# Patient Record
Sex: Male | Born: 1945 | Race: White | Hispanic: No | Smoking: Never smoker
Health system: Southern US, Community
[De-identification: ages and names within clinical notes are randomized; demographics above are authoritative.]

## PROBLEM LIST (undated history)

## (undated) DIAGNOSIS — E559 Vitamin D deficiency, unspecified: Secondary | ICD-10-CM

## (undated) DIAGNOSIS — M5020 Other cervical disc displacement, unspecified cervical region: Secondary | ICD-10-CM

## (undated) DIAGNOSIS — C9002 Multiple myeloma in relapse: Secondary | ICD-10-CM

## (undated) DIAGNOSIS — Z992 Dependence on renal dialysis: Secondary | ICD-10-CM

## (undated) DIAGNOSIS — C9000 Multiple myeloma not having achieved remission: Principal | ICD-10-CM

## (undated) DIAGNOSIS — D638 Anemia in other chronic diseases classified elsewhere: Secondary | ICD-10-CM

## (undated) DIAGNOSIS — W1809XA Striking against other object with subsequent fall, initial encounter: Secondary | ICD-10-CM

## (undated) DIAGNOSIS — N185 Chronic kidney disease, stage 5: Secondary | ICD-10-CM

## (undated) DIAGNOSIS — K5900 Constipation, unspecified: Secondary | ICD-10-CM

## (undated) DIAGNOSIS — I10 Essential (primary) hypertension: Secondary | ICD-10-CM

## (undated) DIAGNOSIS — D696 Thrombocytopenia, unspecified: Principal | ICD-10-CM

## (undated) DIAGNOSIS — R0902 Hypoxemia: Secondary | ICD-10-CM

## (undated) HISTORY — DX: Chronic kidney disease, stage 5: N18.5

## (undated) HISTORY — DX: Anemia in other chronic diseases classified elsewhere: D63.8

## (undated) HISTORY — PX: BONE MARROW ASPIRATE AND BIOPSY WIITH LUMBAR PUNCTURE: SHX1250

## (undated) HISTORY — PX: INGUINAL HERNIA REPAIR: SUR1180

## (undated) HISTORY — DX: Thrombocytopenia, unspecified: D69.6

## (undated) HISTORY — DX: Other cervical disc displacement, unspecified cervical region: M50.20

## (undated) HISTORY — DX: Vitamin D deficiency, unspecified: E55.9

## (undated) HISTORY — DX: Dependence on renal dialysis: Z99.2

## (undated) HISTORY — PX: CERVICAL SPINE SURGERY: SHX589

## (undated) HISTORY — DX: Multiple myeloma in relapse: C90.02

## (undated) HISTORY — DX: Multiple myeloma not having achieved remission: C90.00

## (undated) HISTORY — DX: Striking against other object with subsequent fall, initial encounter: W18.09XA

## (undated) HISTORY — PX: MELANOMA EXCISION: SHX5266

## (undated) HISTORY — PX: ANTERIOR CERVICAL DECOMP/DISCECTOMY FUSION: SHX1161

---

## 2005-01-06 ENCOUNTER — Emergency Department (HOSPITAL_COMMUNITY): Admission: EM | Admit: 2005-01-06 | Discharge: 2005-01-07 | Payer: Self-pay | Admitting: Emergency Medicine

## 2005-01-08 ENCOUNTER — Encounter (HOSPITAL_COMMUNITY): Admission: RE | Admit: 2005-01-08 | Discharge: 2005-02-07 | Payer: Self-pay | Admitting: Oncology

## 2005-01-08 ENCOUNTER — Encounter: Admission: RE | Admit: 2005-01-08 | Discharge: 2005-01-08 | Payer: Self-pay | Admitting: Oncology

## 2005-01-08 ENCOUNTER — Ambulatory Visit (HOSPITAL_COMMUNITY): Payer: Self-pay | Admitting: Oncology

## 2005-01-08 ENCOUNTER — Inpatient Hospital Stay (HOSPITAL_COMMUNITY): Admission: AD | Admit: 2005-01-08 | Discharge: 2005-01-13 | Payer: Self-pay | Admitting: Family Medicine

## 2005-01-13 ENCOUNTER — Ambulatory Visit: Payer: Self-pay | Admitting: Oncology

## 2005-01-13 ENCOUNTER — Encounter (HOSPITAL_COMMUNITY): Payer: Self-pay | Admitting: Oncology

## 2005-04-27 ENCOUNTER — Encounter: Admission: RE | Admit: 2005-04-27 | Discharge: 2005-04-27 | Payer: Self-pay | Admitting: Oncology

## 2005-04-27 ENCOUNTER — Ambulatory Visit (HOSPITAL_COMMUNITY): Payer: Self-pay | Admitting: Oncology

## 2005-05-03 ENCOUNTER — Observation Stay (HOSPITAL_COMMUNITY): Admission: EM | Admit: 2005-05-03 | Discharge: 2005-05-05 | Payer: Self-pay | Admitting: Emergency Medicine

## 2005-05-07 ENCOUNTER — Ambulatory Visit: Payer: Self-pay | Admitting: Cardiology

## 2005-05-19 ENCOUNTER — Encounter: Admission: RE | Admit: 2005-05-19 | Discharge: 2005-05-19 | Payer: Self-pay | Admitting: Oncology

## 2005-05-19 ENCOUNTER — Encounter (HOSPITAL_COMMUNITY): Admission: RE | Admit: 2005-05-19 | Discharge: 2005-06-18 | Payer: Self-pay | Admitting: Oncology

## 2005-06-13 ENCOUNTER — Emergency Department (HOSPITAL_COMMUNITY): Admission: EM | Admit: 2005-06-13 | Discharge: 2005-06-13 | Payer: Self-pay | Admitting: Emergency Medicine

## 2005-06-26 ENCOUNTER — Encounter (HOSPITAL_COMMUNITY): Admission: RE | Admit: 2005-06-26 | Discharge: 2005-07-26 | Payer: Self-pay | Admitting: Oncology

## 2005-06-26 ENCOUNTER — Encounter: Admission: RE | Admit: 2005-06-26 | Discharge: 2005-06-26 | Payer: Self-pay | Admitting: Oncology

## 2005-07-06 ENCOUNTER — Ambulatory Visit (HOSPITAL_COMMUNITY): Payer: Self-pay | Admitting: Oncology

## 2005-07-19 ENCOUNTER — Inpatient Hospital Stay (HOSPITAL_COMMUNITY): Admission: EM | Admit: 2005-07-19 | Discharge: 2005-07-21 | Payer: Self-pay | Admitting: Emergency Medicine

## 2005-08-03 ENCOUNTER — Encounter: Admission: RE | Admit: 2005-08-03 | Discharge: 2005-08-03 | Payer: Self-pay | Admitting: Oncology

## 2005-08-03 ENCOUNTER — Encounter (HOSPITAL_COMMUNITY): Admission: RE | Admit: 2005-08-03 | Discharge: 2005-09-02 | Payer: Self-pay | Admitting: Oncology

## 2005-08-15 ENCOUNTER — Emergency Department (HOSPITAL_COMMUNITY): Admission: EM | Admit: 2005-08-15 | Discharge: 2005-08-15 | Payer: Self-pay | Admitting: Emergency Medicine

## 2005-09-07 ENCOUNTER — Ambulatory Visit (HOSPITAL_COMMUNITY): Payer: Self-pay | Admitting: Oncology

## 2005-09-14 ENCOUNTER — Encounter: Admission: RE | Admit: 2005-09-14 | Discharge: 2005-09-14 | Payer: Self-pay | Admitting: Oncology

## 2005-09-14 ENCOUNTER — Encounter (HOSPITAL_COMMUNITY): Admission: RE | Admit: 2005-09-14 | Discharge: 2005-10-14 | Payer: Self-pay | Admitting: Oncology

## 2005-09-22 ENCOUNTER — Emergency Department (HOSPITAL_COMMUNITY): Admission: EM | Admit: 2005-09-22 | Discharge: 2005-09-22 | Payer: Self-pay | Admitting: Emergency Medicine

## 2005-11-03 ENCOUNTER — Ambulatory Visit (HOSPITAL_COMMUNITY): Payer: Self-pay | Admitting: Oncology

## 2005-11-03 ENCOUNTER — Encounter: Admission: RE | Admit: 2005-11-03 | Discharge: 2005-11-03 | Payer: Self-pay | Admitting: Oncology

## 2005-11-03 ENCOUNTER — Encounter (HOSPITAL_COMMUNITY): Admission: RE | Admit: 2005-11-03 | Discharge: 2005-12-03 | Payer: Self-pay | Admitting: Oncology

## 2005-11-10 ENCOUNTER — Ambulatory Visit (HOSPITAL_COMMUNITY): Payer: Self-pay | Admitting: Oncology

## 2005-12-11 ENCOUNTER — Encounter (HOSPITAL_COMMUNITY): Admission: RE | Admit: 2005-12-11 | Discharge: 2006-01-10 | Payer: Self-pay | Admitting: Oncology

## 2005-12-11 ENCOUNTER — Encounter: Admission: RE | Admit: 2005-12-11 | Discharge: 2005-12-11 | Payer: Self-pay | Admitting: Oncology

## 2006-01-01 ENCOUNTER — Ambulatory Visit (HOSPITAL_COMMUNITY): Payer: Self-pay | Admitting: Oncology

## 2006-01-11 ENCOUNTER — Encounter: Admission: RE | Admit: 2006-01-11 | Discharge: 2006-01-11 | Payer: Self-pay | Admitting: Oncology

## 2006-01-11 ENCOUNTER — Encounter (HOSPITAL_COMMUNITY): Admission: RE | Admit: 2006-01-11 | Discharge: 2006-02-10 | Payer: Self-pay | Admitting: Oncology

## 2006-01-12 ENCOUNTER — Emergency Department (HOSPITAL_COMMUNITY): Admission: EM | Admit: 2006-01-12 | Discharge: 2006-01-12 | Payer: Self-pay | Admitting: Emergency Medicine

## 2006-01-15 ENCOUNTER — Ambulatory Visit (HOSPITAL_COMMUNITY): Admission: RE | Admit: 2006-01-15 | Discharge: 2006-01-15 | Payer: Self-pay | Admitting: Oncology

## 2006-02-11 ENCOUNTER — Encounter (HOSPITAL_COMMUNITY): Admission: RE | Admit: 2006-02-11 | Discharge: 2006-02-13 | Payer: Self-pay | Admitting: Oncology

## 2006-02-15 ENCOUNTER — Encounter (HOSPITAL_COMMUNITY): Admission: RE | Admit: 2006-02-15 | Discharge: 2006-03-17 | Payer: Self-pay | Admitting: Oncology

## 2006-03-05 ENCOUNTER — Ambulatory Visit (HOSPITAL_COMMUNITY): Payer: Self-pay | Admitting: Oncology

## 2006-03-18 ENCOUNTER — Encounter (HOSPITAL_COMMUNITY): Admission: RE | Admit: 2006-03-18 | Discharge: 2006-04-17 | Payer: Self-pay | Admitting: Oncology

## 2006-04-28 ENCOUNTER — Encounter (HOSPITAL_COMMUNITY): Admission: RE | Admit: 2006-04-28 | Discharge: 2006-05-17 | Payer: Self-pay | Admitting: Oncology

## 2006-04-28 ENCOUNTER — Ambulatory Visit (HOSPITAL_COMMUNITY): Payer: Self-pay | Admitting: Oncology

## 2006-05-19 ENCOUNTER — Encounter (HOSPITAL_COMMUNITY): Admission: RE | Admit: 2006-05-19 | Discharge: 2006-06-18 | Payer: Self-pay | Admitting: Oncology

## 2006-06-30 ENCOUNTER — Ambulatory Visit (HOSPITAL_COMMUNITY): Payer: Self-pay | Admitting: Oncology

## 2006-07-05 ENCOUNTER — Ambulatory Visit (HOSPITAL_COMMUNITY): Admission: RE | Admit: 2006-07-05 | Discharge: 2006-07-05 | Payer: Self-pay | Admitting: General Surgery

## 2006-07-21 ENCOUNTER — Encounter (HOSPITAL_COMMUNITY): Admission: RE | Admit: 2006-07-21 | Discharge: 2006-08-20 | Payer: Self-pay | Admitting: Oncology

## 2006-08-16 ENCOUNTER — Ambulatory Visit (HOSPITAL_COMMUNITY): Payer: Self-pay | Admitting: Oncology

## 2006-09-02 ENCOUNTER — Encounter (HOSPITAL_COMMUNITY): Admission: RE | Admit: 2006-09-02 | Discharge: 2006-10-02 | Payer: Self-pay | Admitting: Oncology

## 2006-10-12 ENCOUNTER — Ambulatory Visit (HOSPITAL_COMMUNITY): Payer: Self-pay | Admitting: Oncology

## 2006-10-12 ENCOUNTER — Encounter (HOSPITAL_COMMUNITY): Admission: RE | Admit: 2006-10-12 | Discharge: 2006-11-11 | Payer: Self-pay | Admitting: Oncology

## 2006-11-25 ENCOUNTER — Encounter (HOSPITAL_COMMUNITY): Admission: RE | Admit: 2006-11-25 | Discharge: 2006-12-25 | Payer: Self-pay | Admitting: Oncology

## 2007-01-20 ENCOUNTER — Ambulatory Visit (HOSPITAL_COMMUNITY): Payer: Self-pay | Admitting: Oncology

## 2007-01-20 ENCOUNTER — Encounter (HOSPITAL_COMMUNITY): Admission: RE | Admit: 2007-01-20 | Discharge: 2007-02-15 | Payer: Self-pay | Admitting: Oncology

## 2007-02-17 ENCOUNTER — Encounter (HOSPITAL_COMMUNITY): Admission: RE | Admit: 2007-02-17 | Discharge: 2007-03-19 | Payer: Self-pay | Admitting: Oncology

## 2007-03-24 ENCOUNTER — Ambulatory Visit (HOSPITAL_COMMUNITY): Payer: Self-pay | Admitting: Oncology

## 2007-03-24 ENCOUNTER — Encounter (HOSPITAL_COMMUNITY): Admission: RE | Admit: 2007-03-24 | Discharge: 2007-04-23 | Payer: Self-pay | Admitting: Oncology

## 2007-05-25 ENCOUNTER — Encounter (HOSPITAL_COMMUNITY): Admission: RE | Admit: 2007-05-25 | Discharge: 2007-06-24 | Payer: Self-pay | Admitting: Oncology

## 2007-05-25 ENCOUNTER — Ambulatory Visit (HOSPITAL_COMMUNITY): Payer: Self-pay | Admitting: Oncology

## 2007-07-13 ENCOUNTER — Ambulatory Visit (HOSPITAL_COMMUNITY): Payer: Self-pay | Admitting: Oncology

## 2007-07-13 ENCOUNTER — Encounter (HOSPITAL_COMMUNITY): Admission: RE | Admit: 2007-07-13 | Discharge: 2007-08-12 | Payer: Self-pay | Admitting: Oncology

## 2007-08-17 ENCOUNTER — Encounter (HOSPITAL_COMMUNITY): Admission: RE | Admit: 2007-08-17 | Discharge: 2007-09-16 | Payer: Self-pay | Admitting: Oncology

## 2007-09-07 ENCOUNTER — Ambulatory Visit (HOSPITAL_COMMUNITY): Payer: Self-pay | Admitting: Oncology

## 2007-10-05 ENCOUNTER — Encounter (HOSPITAL_COMMUNITY): Admission: RE | Admit: 2007-10-05 | Discharge: 2007-11-04 | Payer: Self-pay | Admitting: Oncology

## 2007-10-11 ENCOUNTER — Inpatient Hospital Stay (HOSPITAL_COMMUNITY): Admission: EM | Admit: 2007-10-11 | Discharge: 2007-10-13 | Payer: Self-pay | Admitting: Emergency Medicine

## 2007-10-12 ENCOUNTER — Ambulatory Visit: Payer: Self-pay | Admitting: Cardiovascular Disease

## 2007-10-12 ENCOUNTER — Encounter (INDEPENDENT_AMBULATORY_CARE_PROVIDER_SITE_OTHER): Payer: Self-pay | Admitting: Internal Medicine

## 2007-11-08 ENCOUNTER — Encounter (HOSPITAL_COMMUNITY): Admission: RE | Admit: 2007-11-08 | Discharge: 2007-12-08 | Payer: Self-pay | Admitting: Oncology

## 2007-11-08 ENCOUNTER — Ambulatory Visit (HOSPITAL_COMMUNITY): Payer: Self-pay | Admitting: Oncology

## 2008-01-12 ENCOUNTER — Ambulatory Visit (HOSPITAL_COMMUNITY): Payer: Self-pay | Admitting: Oncology

## 2008-01-12 ENCOUNTER — Encounter (HOSPITAL_COMMUNITY): Admission: RE | Admit: 2008-01-12 | Discharge: 2008-02-11 | Payer: Self-pay | Admitting: Oncology

## 2008-02-15 ENCOUNTER — Encounter (HOSPITAL_COMMUNITY): Admission: RE | Admit: 2008-02-15 | Discharge: 2008-03-16 | Payer: Self-pay | Admitting: Oncology

## 2008-03-15 ENCOUNTER — Ambulatory Visit (HOSPITAL_COMMUNITY): Payer: Self-pay | Admitting: Oncology

## 2008-04-10 ENCOUNTER — Encounter (HOSPITAL_COMMUNITY): Admission: RE | Admit: 2008-04-10 | Discharge: 2008-05-10 | Payer: Self-pay | Admitting: Oncology

## 2008-05-17 ENCOUNTER — Encounter (HOSPITAL_COMMUNITY): Admission: RE | Admit: 2008-05-17 | Discharge: 2008-06-16 | Payer: Self-pay | Admitting: Oncology

## 2008-05-17 ENCOUNTER — Ambulatory Visit (HOSPITAL_COMMUNITY): Payer: Self-pay | Admitting: Oncology

## 2008-07-12 ENCOUNTER — Ambulatory Visit (HOSPITAL_COMMUNITY): Payer: Self-pay | Admitting: Oncology

## 2008-07-12 ENCOUNTER — Encounter (HOSPITAL_COMMUNITY): Admission: RE | Admit: 2008-07-12 | Discharge: 2008-08-11 | Payer: Self-pay | Admitting: Oncology

## 2008-09-03 ENCOUNTER — Ambulatory Visit (HOSPITAL_COMMUNITY): Payer: Self-pay | Admitting: Oncology

## 2008-09-03 ENCOUNTER — Encounter (HOSPITAL_COMMUNITY): Admission: RE | Admit: 2008-09-03 | Discharge: 2008-10-03 | Payer: Self-pay | Admitting: Oncology

## 2008-10-19 ENCOUNTER — Ambulatory Visit (HOSPITAL_COMMUNITY): Payer: Self-pay | Admitting: Oncology

## 2008-10-19 ENCOUNTER — Encounter (HOSPITAL_COMMUNITY): Admission: RE | Admit: 2008-10-19 | Discharge: 2008-11-18 | Payer: Self-pay | Admitting: Oncology

## 2009-01-15 ENCOUNTER — Ambulatory Visit (HOSPITAL_COMMUNITY): Payer: Self-pay | Admitting: Oncology

## 2009-01-15 ENCOUNTER — Encounter (HOSPITAL_COMMUNITY): Admission: RE | Admit: 2009-01-15 | Discharge: 2009-02-13 | Payer: Self-pay | Admitting: Oncology

## 2009-02-22 ENCOUNTER — Encounter (HOSPITAL_COMMUNITY): Admission: RE | Admit: 2009-02-22 | Discharge: 2009-03-24 | Payer: Self-pay | Admitting: Oncology

## 2009-04-05 ENCOUNTER — Ambulatory Visit (HOSPITAL_COMMUNITY): Payer: Self-pay | Admitting: Oncology

## 2009-04-05 ENCOUNTER — Encounter (HOSPITAL_COMMUNITY): Admission: RE | Admit: 2009-04-05 | Discharge: 2009-05-05 | Payer: Self-pay | Admitting: Oncology

## 2009-04-20 ENCOUNTER — Emergency Department (HOSPITAL_COMMUNITY): Admission: EM | Admit: 2009-04-20 | Discharge: 2009-04-20 | Payer: Self-pay | Admitting: Emergency Medicine

## 2009-05-16 ENCOUNTER — Encounter (HOSPITAL_COMMUNITY): Admission: RE | Admit: 2009-05-16 | Discharge: 2009-06-15 | Payer: Self-pay | Admitting: Oncology

## 2009-06-27 ENCOUNTER — Ambulatory Visit (HOSPITAL_COMMUNITY): Payer: Self-pay | Admitting: Oncology

## 2009-06-27 ENCOUNTER — Encounter (HOSPITAL_COMMUNITY): Admission: RE | Admit: 2009-06-27 | Discharge: 2009-07-27 | Payer: Self-pay | Admitting: Oncology

## 2009-09-19 ENCOUNTER — Encounter (HOSPITAL_COMMUNITY): Admission: RE | Admit: 2009-09-19 | Discharge: 2009-10-19 | Payer: Self-pay | Admitting: Oncology

## 2009-09-19 ENCOUNTER — Ambulatory Visit (HOSPITAL_COMMUNITY): Payer: Self-pay | Admitting: Oncology

## 2009-12-19 ENCOUNTER — Encounter (HOSPITAL_COMMUNITY): Admission: RE | Admit: 2009-12-19 | Discharge: 2010-01-18 | Payer: Self-pay | Admitting: Oncology

## 2010-02-21 ENCOUNTER — Ambulatory Visit (HOSPITAL_COMMUNITY): Payer: Self-pay | Admitting: Oncology

## 2010-02-21 ENCOUNTER — Encounter (HOSPITAL_COMMUNITY)
Admission: RE | Admit: 2010-02-21 | Discharge: 2010-03-23 | Payer: Self-pay | Source: Home / Self Care | Admitting: Oncology

## 2010-03-05 ENCOUNTER — Ambulatory Visit: Payer: Self-pay | Admitting: Cardiology

## 2010-03-05 DIAGNOSIS — I498 Other specified cardiac arrhythmias: Secondary | ICD-10-CM

## 2010-03-05 DIAGNOSIS — I10 Essential (primary) hypertension: Secondary | ICD-10-CM | POA: Insufficient documentation

## 2010-03-11 ENCOUNTER — Ambulatory Visit: Payer: Self-pay | Admitting: Cardiology

## 2010-03-12 ENCOUNTER — Telehealth (INDEPENDENT_AMBULATORY_CARE_PROVIDER_SITE_OTHER): Payer: Self-pay

## 2010-03-21 ENCOUNTER — Telehealth (INDEPENDENT_AMBULATORY_CARE_PROVIDER_SITE_OTHER): Payer: Self-pay

## 2010-03-25 ENCOUNTER — Encounter (HOSPITAL_COMMUNITY)
Admission: RE | Admit: 2010-03-25 | Discharge: 2010-04-24 | Payer: Self-pay | Source: Home / Self Care | Attending: Oncology | Admitting: Oncology

## 2010-04-04 ENCOUNTER — Ambulatory Visit (HOSPITAL_COMMUNITY): Admission: RE | Admit: 2010-04-04 | Discharge: 2010-04-04 | Payer: Self-pay | Admitting: Oncology

## 2010-04-06 ENCOUNTER — Emergency Department (HOSPITAL_COMMUNITY): Admission: EM | Admit: 2010-04-06 | Discharge: 2010-04-06 | Payer: Self-pay | Admitting: Emergency Medicine

## 2010-04-14 ENCOUNTER — Ambulatory Visit: Payer: Self-pay | Admitting: Cardiology

## 2010-04-21 ENCOUNTER — Telehealth (INDEPENDENT_AMBULATORY_CARE_PROVIDER_SITE_OTHER): Payer: Self-pay

## 2010-04-28 ENCOUNTER — Telehealth (INDEPENDENT_AMBULATORY_CARE_PROVIDER_SITE_OTHER): Payer: Self-pay | Admitting: *Deleted

## 2010-04-29 ENCOUNTER — Encounter (HOSPITAL_COMMUNITY)
Admission: RE | Admit: 2010-04-29 | Discharge: 2010-05-29 | Payer: Self-pay | Source: Home / Self Care | Attending: Oncology | Admitting: Oncology

## 2010-04-29 ENCOUNTER — Ambulatory Visit (HOSPITAL_COMMUNITY): Payer: Self-pay | Admitting: Oncology

## 2010-05-01 ENCOUNTER — Encounter: Payer: Self-pay | Admitting: Cardiology

## 2010-05-06 ENCOUNTER — Ambulatory Visit: Payer: Self-pay | Admitting: Cardiology

## 2010-05-20 ENCOUNTER — Ambulatory Visit (HOSPITAL_COMMUNITY)
Admission: RE | Admit: 2010-05-20 | Discharge: 2010-05-20 | Payer: Self-pay | Source: Home / Self Care | Attending: Family Medicine | Admitting: Family Medicine

## 2010-06-07 ENCOUNTER — Encounter: Payer: Self-pay | Admitting: Family Medicine

## 2010-06-08 ENCOUNTER — Encounter: Payer: Self-pay | Admitting: Family Medicine

## 2010-06-08 ENCOUNTER — Encounter (HOSPITAL_COMMUNITY): Payer: Self-pay | Admitting: Oncology

## 2010-06-17 NOTE — Assessment & Plan Note (Signed)
Summary: ***np6 uncontrolled htn   Visit Type:  Initial Consult Referring Provider:  Dr. [name] Primary Provider:  Dr. Karleen Hampshire   History of Present Illness: 65 year old male referred for cardiology consultation. He is referred for assistance with management of high blood pressure. He has been on verapamil for some time now with steadily escalating doses. Diastolic was at a few weeks ago. Blood pressure is better controlled, however the patient is significantly bradycardic, and describes weakness. He reports having a relatively low heart rate at baseline anyway.  No problems with chest pain or unusual shortness of breath. He has a history of multiple myeloma as noted below.  Recent lab work from 11 October showed potassium 4.3, BUN 35, creatinine 2.4, hemoglobin 14.2, platelets 75, CBC 5.9. Also evidence of advancing proteinuria.  Current Medications (verified): 1)  Verapamil Hcl Cr 360 Mg Xr24h-Cap (Verapamil Hcl) .... Take 1 Cap Daily 2)  Bystolic 5 Mg Tabs (Nebivolol Hcl) .... Take 1 Tab Daily 3)  Fish Oil 1000 Mg Caps (Omega-3 Fatty Acids) .... Take 2 Caps Daily  Allergies (verified): No Known Drug Allergies  Past History:  Family History: Last updated: 03/05/2010 Family History of Diabetes  Social History: Last updated: 03/05/2010 Single  Tobacco Use - No Alcohol Use - no Regular Exercise - yes Drug Use - no  Past Medical History: Chronic renal insufficency Pancytopenia Hypogonadism Multiple myeloma - Dr. Mariel Sleet Hypertension  Past Surgical History: Left inguinal herniorrhaphy 2008  Family History: Family History of Diabetes  Social History: Single  Tobacco Use - No Alcohol Use - no Regular Exercise - yes Drug Use - no  Review of Systems  The patient denies anorexia, fever, weight loss, chest pain, syncope, dyspnea on exertion, peripheral edema, melena, and hematochezia.         Otherwise reviewed and negative.  Vital  Signs:  Patient profile:   64 year old male Height:      72 inches Weight:      209 pounds BMI:     28.45 Pulse rate:   41 / minute BP sitting:   132 / 74  (right arm)  Vitals Entered By: Dreama Saa, CNA (March 05, 2010 1:43 PM)  Physical Exam  Additional Exam:  Overweight male in no acute distress. HEENT: Conjunctiva and lids normal, oropharynx clear. Neck: Supple, no elevated JVP or bruits. Cardiac: Regular rate and rhythm, no S3 or significant systolic murmur. Lungs: Clear to auscultation, nonlabored. Abdomen: Soft, nontender, bowel sounds present. Skin: Warm and dry. Extremities: No pitting edema, distal pulses full. Musculoskeletal: No kyphosis. Neuropsychiatric: Alert and oriented x3, affect grossly appropriate.    Echocardiogram  Procedure date:  10/12/2007  Findings:       SUMMARY   -  Overall left ventricular systolic function was normal. Left         ventricular ejection fraction was estimated to be 60 %. Left         ventricular wall thickness was mildly increased.   -  Calcification of non-coronary cu  Carotid Doppler  Procedure date:  10/12/2007  Findings:      IMPRESSION:    1.  Early bilateral carotid bifurcation plaque without significant   stenosis.  The exam does not exclude plaque ulceration or   embolization.  Continued surveillance recommended.   EKG  Procedure date:  03/05/2010  Findings:      Significant sinus bradycardia at 39 beats per minute with left atrial enlargement and left ventricular hypertrophy, QRS 118 ms.  Impression & Recommendations:  Problem # 1:  ESSENTIAL HYPERTENSION, BENIGN (ICD-401.1)  Verapamil is not an unreasonable medicine in this case, particularly with LVH and proteinuria, however escalating doses over time have not obtained blood pressure control. He has had better response in blood pressure with the recent addition of Bystolic, however these medicines together are leading to significant bradycardia.  We discussed this today, and our plan will be to wean him off verapamil over 2 days with initiation of Norvasc 5 mg daily and continuation of Bystolic. Will then have him come in to the office for a nurse blood pressure check next week, anticipating further medication adjustments. I will then see him back in one month.  The following medications were removed from the medication list:    Lisinopril 10 Mg Tabs (Lisinopril) .Marland Kitchen... Take 1 tab daily    Aspir-low 81 Mg Tbec (Aspirin) .Marland Kitchen... Take 1 tab daily    Verapamil Hcl Cr 240 Mg Cr-tabs (Verapamil hcl) .Marland Kitchen... Take 1 tab daily His updated medication list for this problem includes:    Verapamil Hcl Cr 360 Mg Xr24h-cap (Verapamil hcl) .Marland Kitchen... Take 1 cap daily    Bystolic 5 Mg Tabs (Nebivolol hcl) .Marland Kitchen... Take 1 tab daily  Problem # 2:  BRADYCARDIA (ICD-427.89)  Patient reports weakness, some fatigue, likely related in some way to slow heart rates. As noted above, medication adjustments will be made. Norvasc is unlikely to significantly affect heart rate.  The following medications were removed from the medication list:    Lisinopril 10 Mg Tabs (Lisinopril) .Marland Kitchen... Take 1 tab daily    Aspir-low 81 Mg Tbec (Aspirin) .Marland Kitchen... Take 1 tab daily    Verapamil Hcl Cr 240 Mg Cr-tabs (Verapamil hcl) .Marland Kitchen... Take 1 tab daily    Verapamil Hcl Cr 360 Mg Xr24h-cap (Verapamil hcl) .Marland Kitchen... Take 1 cap daily    Norvasc 5 Mg Tabs (Amlodipine besylate) .Marland Kitchen... Take 1 tablet po once daily His updated medication list for this problem includes:    Bystolic 5 Mg Tabs (Nebivolol hcl) .Marland Kitchen... Take 1 tab daily    Norvasc 5 Mg Tabs (Amlodipine besylate) .Marland Kitchen... Take 1 tablet by mouth once daily  Patient Instructions: 1)  Your physician recommends that you schedule a follow-up appointment in: 1 week for nurse visit and 1 month with Dr. Diona Browner 2)  Your physician has recommended you make the following change in your medication: Start taking Norvasc 5 mg by mouth once daily. Once you start this  medication, decrease Verapamil to 180mg   by mouth for 2 days then stop taking.  (continue to take Norvasc but stop the Verapamil) Prescriptions: NORVASC 5 MG TABS (AMLODIPINE BESYLATE) take 1 tablet po once daily  #30 x 3   Entered by:   Larita Fife Via LPN   Authorized by:   Loreli Slot, MD, [facility]   Signed by:   Larita Fife Via LPN on 08/65/7846   Method used:   Electronically to        [facility] (retail)       [address] [place], [place]  [address]       Ph: [phone]       Fax: [phone]   RxID:   [id number]

## 2010-06-17 NOTE — Progress Notes (Signed)
Summary: [name] has question about new meds  Phone Note Call from Patient Call back at Home Phone [phone]   Caller: [name] Reason for Call: Talk to Nurse Summary of Call: [name] would like to talk to nurse about medication the doctor put him on, he doesnt know the name of it. Please call him asap per his request. Initial call taken by: Faythe Ghee,  April 21, 2010 12:06 PM  Follow-up for Phone Call        No answer, will continue to call. Follow-up by: Larita Fife Via LPN,  April 21, 2010 1:19 PM  Additional Follow-up for Phone Call Additional follow up Details #1::        [name]. states that Furosemide has helped his BP but he still has swelling in legs and feet. [name]. states he is urinating "alot" and some of the swelling has gone down. [name]. advised to continue Furosemide and to call us if he has more swelling, SOB, CP, etc. He states he understands. Additional Follow-up by: Larita Fife Via LPN,  April 21, 2010 2:38 PM

## 2010-06-17 NOTE — Letter (Signed)
Summary: [facility] MEDICAL RECORDS  [facility]   Imported By: Faythe Ghee 03/05/2010 16:20:17  _____________________________________________________________________  External Attachment:    Type:   Image     Comment:   External Document

## 2010-06-17 NOTE — Assessment & Plan Note (Signed)
**Note De-Identified Jonella Redditt Obfuscation** Summary: NURSE  Nurse Visit   Vital Signs:  Patient profile:   65 year old male Weight:      208 pounds O2 Sat:      96 % on Room air Pulse rate:   48 / minute BP sitting:   133 / 87  (left arm)  Vitals Entered By: Larita Fife Tykera Skates LPN (March 11, 2010 4:31 PM)  O2 Flow:  Room air  Visit Type:  Bp check/nurse visit Referring Provider:  Dr. [name] Primary Provider:  Dr. Karleen Hampshire   History of Present Illness: S:Pt. returns to office for BP check. B: On last OV on 10-19, pt. was advised to start taking Norvasc 5 mg po qd and to decrease Verapamil to 180mg  X 2 days and then stop taking.  A: He c/o fatigue and weakness and wants to know if meds can be adjusted before next OV on 11-28 to give him more energy. Also, Dr. Greggory Stallion advised pt. to stop taking ASA 81mg  as Fish oil caps causes less bruising,  pt. wants to know if this is what he should take.  R: Will call pt. with Dr. Ival Bible recommendations.   Impression & Recommendations:  Problem # 1:  ESSENTIAL HYPERTENSION, BENIGN (ICD-401.1)  Blood pressure much better controlled. Continue current medications. We can discuss further adjustments at followup. He remains bradycardic, and this may limit use of Bystolic.  His updated medication list for this problem includes:    Bystolic 5 Mg Tabs (Nebivolol hcl) .Marland Kitchen... Take 1 tab daily    Norvasc 5 Mg Tabs (Amlodipine besylate) .Marland Kitchen... Take 1 tablet by mouth once daily   Current Medications (verified): 1)  Bystolic 5 Mg Tabs (Nebivolol Hcl) .... Take 1 Tab Daily 2)  Fish Oil 1000 Mg Caps (Omega-3 Fatty Acids) .... Take 2 Caps Daily 3)  Norvasc 5 Mg Tabs (Amlodipine Besylate) .... Take 1 Tablet By Mouth Once Daily  Allergies (verified): No Known Drug Allergies

## 2010-06-17 NOTE — Progress Notes (Signed)
Summary: BP concerns  Phone Note Call from Patient   Caller: Patient Reason for Call: Talk to Nurse Summary of Call: patient states that he is having BP issues / would like to speak with nurse / tg Initial call taken by: Raechel Ache [facility],  March 12, 2010 8:50 AM  Follow-up for Phone Call        Pt. had nurse visit yesterday (see nurse visit 10-25). Pt. has appt. to f/u with Dr. Diona Browner on 11-28. Follow-up by: Larita Fife Via LPN,  March 12, 2010 10:10 AM

## 2010-06-17 NOTE — Assessment & Plan Note (Signed)
Summary: F1M   Visit Type:  Follow-up Referring Provider:  Dr. [name] Primary Provider:  Dr. Karleen Hampshire   History of Present Illness: 65 year old male presents for followup. He was seen back in October for assistance of hypertension management in the setting of bradycardia and renal insufficiency. Medication adjustments have been made, noted below.  Patient reports tolerating his medications, outlined below, however does complain of lower extremity edema. This has been worse recently, although a fluctuating problem over time. He reports previously being on a diuretic. I spoke with Dr. Kristian Covey by phone about the patient's history of renal disease and proteinuria. Baseline creatinine has been around 2.4-2.5, although reportedly proteinuria has improved to less than 1 g over time. We discussed resuming diuretic therapy with close followup of renal function. He is due to see Dr. Kristian Covey in about a month.  Otherwise no progressive shortness of breath or chest pain.    Clinical Review Panels:  Echocardiogram Echocardiogram  SUMMARY   -  Overall left ventricular systolic function was normal. Left         ventricular ejection fraction was estimated to be 60 %. Left         ventricular wall thickness was mildly increased.   -  Calcification of non-coronary cusp     ---------------------------------------------------------------    Prepared and Electronically Authenticated by    Charlton Haws M.D.   Confirmed 12-Oct-2007 13:18:45  Additional Information  HL7 RESULT STATUS : F (10/12/2007)    Current Medications (verified): 1)  Bystolic 5 Mg Tabs (Nebivolol Hcl) .... Take 1 Tab Daily 2)  Fish Oil 1000 Mg Caps (Omega-3 Fatty Acids) .... Take 2 Caps Daily 3)  Norvasc 10 Mg Tabs (Amlodipine Besylate) .... Take 1 Tablet By Mouth Once Daily 4)  Lasix 40 Mg Tabs (Furosemide) .... Take 1 Tablet By Mouth Once Daily  Allergies (verified): No Known Drug  Allergies  Comments:  Nurse/Medical Assistant: patient reviewed meds from previous ov and stated all meds are correcte only taks 3   Past History:  Past Medical History: Last updated: 03/05/2010 Chronic renal insufficency Pancytopenia Hypogonadism Multiple myeloma - Dr. Mariel Sleet Hypertension  Social History: Last updated: 03/05/2010 Single  Tobacco Use - No Alcohol Use - no Regular Exercise - yes Drug Use - no  Review of Systems       The patient complains of peripheral edema.  The patient denies anorexia, fever, weight loss, chest pain, syncope, dyspnea on exertion, prolonged cough, melena, and hematochezia.         Otherwise reviewed and negative except as outlined.  Vital Signs:  Patient profile:   65 year old male Weight:      219 pounds BMI:     29.81 Pulse rate:   45 / minute BP sitting:   144 / 89  (right arm)  Vitals Entered By: Dreama Saa, CNA (April 14, 2010 2:00 PM)  Physical Exam  Additional Exam:  Overweight male in no acute distress. HEENT: Conjunctiva and lids normal, oropharynx clear. Neck: Supple, no elevated JVP or bruits. Cardiac: Regular rate and rhythm, no S3 or significant systolic murmur. Lungs: Clear to auscultation, nonlabored. Abdomen: Soft, nontender, bowel sounds present. Skin: Warm and dry. Extremities: 2+ pitting edema, distal pulses full. Musculoskeletal: No kyphosis. Neuropsychiatric: Alert and oriented x3, affect grossly appropriate.    Impression & Recommendations:  Problem # 1:  ESSENTIAL HYPERTENSION, BENIGN (ICD-401.1)  Blood pressure control is better on present medications, outlined above. He is having worsened lower extremity  edema, possibly a side effect of Norvasc, although also in the setting of renal insufficiency and prior documentation of proteinuria. We discussed these issues today, and I reviewed the situation with Dr. Kristian Covey by phone. Plan at this point will be to initiate Lasix at 40 mg daily,  followup BMET in 2 weeks, and make further adjustments from there. He is also due to see Dr. Kristian Covey back in one month.  His updated medication list for this problem includes:    Bystolic 5 Mg Tabs (Nebivolol hcl) .Marland Kitchen... Take 1 tab daily    Norvasc 10 Mg Tabs (Amlodipine besylate) .Marland Kitchen... Take 1 tablet by mouth once daily    Lasix 40 Mg Tabs (Furosemide) .Marland Kitchen... Take 1 tablet by mouth once daily  Future Orders: T-Basic Metabolic Panel [id number]) ... 04/28/2010  His updated medication list for this problem includes:    Bystolic 5 Mg Tabs (Nebivolol hcl) .Marland Kitchen... Take 1 tab daily    Norvasc 10 Mg Tabs (Amlodipine besylate) .Marland Kitchen... Take 1 tablet by mouth once daily    Lasix 40 Mg Tabs (Furosemide) .Marland Kitchen... Take 1 tablet by mouth once daily  Problem # 2:  BRADYCARDIA (ICD-427.89)  Relatively stable. No plan to discontinue Bystolic as yet.  His updated medication list for this problem includes:    Bystolic 5 Mg Tabs (Nebivolol hcl) .Marland Kitchen... Take 1 tab daily    Norvasc 10 Mg Tabs (Amlodipine besylate) .Marland Kitchen... Take 1 tablet by mouth once daily  His updated medication list for this problem includes:    Bystolic 5 Mg Tabs (Nebivolol hcl) .Marland Kitchen... Take 1 tab daily    Norvasc 10 Mg Tabs (Amlodipine besylate) .Marland Kitchen... Take 1 tablet by mouth once daily  Patient Instructions: 1)  Your physician recommends that you schedule a follow-up appointment in: 3 months 2)  Your physician has recommended you make the following change in your medication: start taking Furosemide (lasix) 40mg  by mouth once daily  3)  Your physician recommends that you return for lab work in: 2 weeks Prescriptions: LASIX 40 MG TABS (FUROSEMIDE) take 1 tablet by mouth once daily  #30 x 3   Entered by:   Larita Fife Via LPN   Authorized by:   Loreli Slot, MD, [facility]   Signed by:   Larita Fife Via LPN on 95/28/4132   Method used:   Electronically to        [facility] (retail)       [address] [place], [place]  [address]       Ph: [phone]       Fax: [phone]   RxID:   [id number]

## 2010-06-17 NOTE — Progress Notes (Signed)
Summary: Medication Concerns  Phone Note Call from Patient   Caller: Patient Reason for Call: Talk to Nurse, Talk to Doctor Summary of Call: pt states that his BP is running high 170/95 / has concerns about medication dosage / Amlodipine 5mg  once daily / tg Initial call taken by: Raechel Ache [facility],  March 21, 2010 1:43 PM  Follow-up for Phone Call        Pt. is concerned about his BP. He states he checks BP at least 3 times daily and it has been running high at around 170/95 and that ocassionally he gets a good reading but not often. Wants to know if Dr. Diona Browner can adjust his meds before his 11-28 appt. with him. Pt. states that is to long to wait with BP running high. Please advise. Follow-up by: Larita Fife Via LPN,  March 21, 2010 4:59 PM  Additional Follow-up for Phone Call Additional follow up Details #1::        Pt. called this morning, very upset, stating that noone returned his call on Friday and that he went all weekend with his BP running high. He states his BP was as high as 157/97 and that his pulse was in the low 40's. He states he has Kidney disease and has been told to keep his BP undercontrol. Pt. advised that I did return his call  on Friday and sent note to Dr. Diona Browner before I left the office. Also, I  advised him to go to ER if he started having CP, SOB, nausea, sweating, etc. He states that he either needs to be seen soon or his medicaton needs to be increased.  Pt. denies CP, SOB, nausea, etc. at this time.  Pt. advised that his BP will remain elevated if he continues to be angry and that he should calm down before checking BP. Pt. advised that Dr. Diona Browner will be made aware of his BP concerns and we will call, him with Dr. Diona Browner recommendations. Also, pt. advised to contact PCP, Dr. Regino Schultze, or go to ER if he develops Additional Follow-up by: Larita Fife Via LPN,  March 24, 2010 9:46 AM    Additional Follow-up for Phone Call Additional follow up Details #2::    Reviewed  recent records, phone notes and nurse BP check. His BP was better at the nurse visit, however if trend is going up, can increase Norvasc to 10 mg daily. Continue to check BP at home, likely only needs to do this at most twice a day. Follow-up by: Loreli Slot, MD, [facility],  March 24, 2010 3:50 PM  Additional Follow-up for Phone Call Additional follow up Details #3:: Details for Additional Follow-up Action Taken: [facility].  Additional Follow-up by: Larita Fife Via LPN,  March 24, 2010 4:58 PM  New/Updated Medications: NORVASC 10 MG TABS (AMLODIPINE BESYLATE) take 1 tablet by mouth once daily Prescriptions: NORVASC 10 MG TABS (AMLODIPINE BESYLATE) take 1 tablet by mouth once daily  #30 x 3   Entered by:   Larita Fife Via LPN   Authorized by:   Loreli Slot, MD, [facility]   Signed by:   Larita Fife Via LPN on 24/40/1027   Method used:   Electronically to        [facility] (retail)       [address] [place], [place]  [address]       Ph: [phone]  Fax: [phone]   RxID:   [id number]  Pt. advised, he states he understands in structions given. Larita Fife Via LPN  March 25, 2010 10:09 AM

## 2010-06-19 NOTE — Progress Notes (Signed)
  Phone Note Call from Patient   Caller: Patient Call For: edema and fluid gain  Follow-up for Phone Call        pt came by office to report his increasing edema he has 1+ pitting edema in his legs up to his knees, he has a little cough.  He does not want to be on amlodipine any longer since it is causing all these problems. Requests something else for his bp.  The two of you discussed this at your last office visit. Follow-up by: Teressa Lower RN,  April 28, 2010 4:40 PM  Additional Follow-up for Phone Call Additional follow up Details #1::        Please review my last note. At that visit we added Lasix 40 mg a day. Has he had any improvement while taking this? If not would suggest reducing Norvasc to 5 mg daily, and following blood pressure. We may ultimately need to discontinue Norvasc altogether, however it was providing better blood pressure control, and edema has been a chronic intermittent problem. He also has history of proteinuria and renal dysfunction followed by Dr. Kristian Covey. Additional Follow-up by: Loreli Slot, MD, [facility],  April 28, 2010 5:20 PM    Additional Follow-up for Phone Call Additional follow up Details #2::    pt states no improvement in edema since starting lasix, he will decrease norvasc to 5mg  daily, take bp daily, bmp to be done today and will have a nurse visit on 05/06/10 to reassess bp and norvasc Follow-up by: Teressa Lower RN,  April 29, 2010 10:19 AM

## 2010-07-01 ENCOUNTER — Other Ambulatory Visit (HOSPITAL_COMMUNITY): Payer: Self-pay | Admitting: Oncology

## 2010-07-01 ENCOUNTER — Encounter (HOSPITAL_COMMUNITY): Payer: Self-pay

## 2010-07-01 ENCOUNTER — Encounter (HOSPITAL_COMMUNITY): Payer: Self-pay | Admitting: Oncology

## 2010-07-01 ENCOUNTER — Other Ambulatory Visit (HOSPITAL_COMMUNITY): Payer: Medicare Other

## 2010-07-01 ENCOUNTER — Ambulatory Visit (HOSPITAL_COMMUNITY)
Admission: RE | Admit: 2010-07-01 | Discharge: 2010-07-01 | Disposition: A | Discharge status: Home / Self Care | Payer: Medicare Other | Source: Ambulatory Visit | Attending: Oncology | Admitting: Oncology

## 2010-07-01 ENCOUNTER — Encounter (HOSPITAL_COMMUNITY): Payer: Medicare Other | Attending: Oncology

## 2010-07-01 DIAGNOSIS — C9000 Multiple myeloma not having achieved remission: Secondary | ICD-10-CM

## 2010-07-01 DIAGNOSIS — Z09 Encounter for follow-up examination after completed treatment for conditions other than malignant neoplasm: Secondary | ICD-10-CM | POA: Insufficient documentation

## 2010-07-01 DIAGNOSIS — R937 Abnormal findings on diagnostic imaging of other parts of musculoskeletal system: Secondary | ICD-10-CM | POA: Insufficient documentation

## 2010-07-01 HISTORY — DX: Essential (primary) hypertension: I10

## 2010-07-01 LAB — COMPREHENSIVE METABOLIC PANEL
ALT: 39 U/L (ref 0–53)
AST: 31 U/L (ref 0–37)
Alkaline Phosphatase: 33 U/L — ABNORMAL LOW (ref 39–117)
CO2: 27 mEq/L (ref 19–32)
Chloride: 103 mEq/L (ref 96–112)
GFR calc Af Amer: 32 mL/min — ABNORMAL LOW (ref >60)
GFR calc non Af Amer: 27 mL/min — ABNORMAL LOW (ref >60)
Glucose, Bld: 95 mg/dL (ref 70–99)
Potassium: 5.2 mEq/L — ABNORMAL HIGH (ref 3.5–5.1)
Sodium: 138 mEq/L (ref 135–145)
Total Bilirubin: 0.5 mg/dL (ref 0.3–1.2)

## 2010-07-01 LAB — DIFFERENTIAL
Eosinophils Absolute: 0.1 10*3/uL (ref 0.0–0.7)
Lymphocytes Relative: 27 % (ref 12–46)
Lymphs Abs: 1.9 10*3/uL (ref 0.7–4.0)
Monocytes Relative: 7 % (ref 3–12)
Neutrophils Relative %: 66 % (ref 43–77)

## 2010-07-01 LAB — CBC
Hemoglobin: 12.2 g/dL — ABNORMAL LOW (ref 13.0–17.0)
MCH: 35.4 pg — ABNORMAL HIGH (ref 26.0–34.0)
RBC: 3.45 MIL/uL — ABNORMAL LOW (ref 4.22–5.81)
WBC: 7.1 10*3/uL (ref 4.0–10.5)

## 2010-07-02 LAB — KAPPA/LAMBDA LIGHT CHAINS: Kappa free light chain: 11 mg/dL — ABNORMAL HIGH (ref 0.33–1.94)

## 2010-07-03 LAB — PROTEIN ELECTROPHORESIS, SERUM
Alpha-1-Globulin: 4.9 % (ref 2.9–4.9)
Alpha-2-Globulin: 11.2 % (ref 7.1–11.8)
Beta Globulin: 5.8 % (ref 4.7–7.2)
Gamma Globulin: 9.2 % — ABNORMAL LOW (ref 11.1–18.8)
M-Spike, %: NOT DETECTED g/dL

## 2010-07-03 LAB — IMMUNOFIXATION ELECTROPHORESIS: IgG (Immunoglobin G), Serum: 615 mg/dL — ABNORMAL LOW (ref 694–1618)

## 2010-07-04 ENCOUNTER — Other Ambulatory Visit (HOSPITAL_COMMUNITY): Payer: Self-pay | Admitting: Oncology

## 2010-07-04 LAB — PROTEIN, URINE, 24 HOUR
Collection Interval-UPROT: 24 hours
Protein, 24H Urine: 1269 mg/d — ABNORMAL HIGH (ref 50–100)
Urine Total Volume-UPROT: 2150 mL

## 2010-07-08 LAB — IMMUNOFIXATION, URINE

## 2010-07-09 ENCOUNTER — Ambulatory Visit (HOSPITAL_COMMUNITY): Payer: Medicare Other | Admitting: Oncology

## 2010-07-09 DIAGNOSIS — C9000 Multiple myeloma not having achieved remission: Secondary | ICD-10-CM

## 2010-07-15 ENCOUNTER — Telehealth (INDEPENDENT_AMBULATORY_CARE_PROVIDER_SITE_OTHER): Payer: Self-pay

## 2010-07-17 ENCOUNTER — Ambulatory Visit: Payer: Self-pay | Admitting: Cardiology

## 2010-07-24 NOTE — Progress Notes (Signed)
Summary: pt thinks he doesnt need to see cardio doc anymore  Phone Note Call from Patient Call back at Home Phone [phone]   Caller: pt Summary of Call: patient spoke with lela at [address] office and wanted [place] to cancel all his upcoming appointments with our office because the kidney doctor he sees has took him off all his heart meds and told him that those meds is what was messing him up. Patient feels that the kidney docotr is the only doctor he needs to see.  I have not canceled appointment please advise. Initial call taken by: Faythe Ghee,  July 15, 2010 9:25 AM  Follow-up for Phone Call        Please advise. Follow-up by: Larita Fife Via LPN,  July 15, 2010 10:05 AM  Additional Follow-up for Phone Call Additional follow up Details #1::        I am happy to see him back and continue to assist with his hypertension management. I cannot however make him keep a scheduled followup visit. If he wants to cancel subsequent visits, this is his decision. Additional Follow-up by: Loreli Slot, MD, [facility],  July 15, 2010 12:55 PM

## 2010-07-29 LAB — CBC
HCT: 41.7 % (ref 39.0–52.0)
Hemoglobin: 14 g/dL (ref 13.0–17.0)
MCH: 34.3 pg — ABNORMAL HIGH (ref 26.0–34.0)
MCHC: 33.7 g/dL (ref 30.0–36.0)
RDW: 13.9 % (ref 11.5–15.5)

## 2010-07-29 LAB — DIFFERENTIAL
Basophils Absolute: 0 10*3/uL (ref 0.0–0.1)
Basophils Relative: 0 % (ref 0–1)
Eosinophils Absolute: 0.1 10*3/uL (ref 0.0–0.7)
Monocytes Absolute: 0.6 10*3/uL (ref 0.1–1.0)
Monocytes Relative: 9 % (ref 3–12)
Neutro Abs: 4.3 10*3/uL (ref 1.7–7.7)
Neutrophils Relative %: 65 % (ref 43–77)

## 2010-07-29 LAB — TISSUE HYBRIDIZATION (BONE MARROW)-NCBH

## 2010-07-30 LAB — CREATININE CLEARANCE, URINE, 24 HOUR
Collection Interval-CRCL: 24 hours
Creatinine Clearance: 41 mL/min — ABNORMAL LOW (ref 75–125)
Creatinine, 24H Ur: 1469 mg/d (ref 800–2000)
Creatinine, Urine: 79.42 mg/dL

## 2010-07-30 LAB — PROTEIN, URINE, 24 HOUR: Urine Total Volume-UPROT: 1850 mL

## 2010-07-30 LAB — COMPREHENSIVE METABOLIC PANEL
BUN: 35 mg/dL — ABNORMAL HIGH (ref 6–23)
CO2: 27 mEq/L (ref 19–32)
Calcium: 8.6 mg/dL (ref 8.4–10.5)
Chloride: 108 mEq/L (ref 96–112)
Creatinine, Ser: 2.46 mg/dL — ABNORMAL HIGH (ref 0.4–1.5)
GFR calc non Af Amer: 27 mL/min — ABNORMAL LOW (ref >60)
Glucose, Bld: 92 mg/dL (ref 70–99)
Total Bilirubin: 0.6 mg/dL (ref 0.3–1.2)

## 2010-07-30 LAB — DIFFERENTIAL
Basophils Absolute: 0 10*3/uL (ref 0.0–0.1)
Eosinophils Relative: 2 % (ref 0–5)
Lymphocytes Relative: 29 % (ref 12–46)
Lymphs Abs: 1.7 10*3/uL (ref 0.7–4.0)
Neutro Abs: 3.6 10*3/uL (ref 1.7–7.7)
Neutrophils Relative %: 61 % (ref 43–77)

## 2010-07-30 LAB — CBC
Hemoglobin: 14.2 g/dL (ref 13.0–17.0)
MCH: 35.4 pg — ABNORMAL HIGH (ref 26.0–34.0)
MCHC: 34.2 g/dL (ref 30.0–36.0)
MCV: 103.3 fL — ABNORMAL HIGH (ref 78.0–100.0)
RBC: 4.03 MIL/uL — ABNORMAL LOW (ref 4.22–5.81)

## 2010-07-30 LAB — IMMUNOFIXATION ELECTROPHORESIS: IgG (Immunoglobin G), Serum: 570 mg/dL — ABNORMAL LOW (ref 694–1618)

## 2010-07-30 LAB — PROTEIN ELECTROPHORESIS, SERUM
Alpha-2-Globulin: 10.3 % (ref 7.1–11.8)
Gamma Globulin: 9.4 % — ABNORMAL LOW (ref 11.1–18.8)
M-Spike, %: NOT DETECTED g/dL

## 2010-07-30 LAB — KAPPA/LAMBDA LIGHT CHAINS
Kappa free light chain: 4.15 mg/dL — ABNORMAL HIGH (ref 0.33–1.94)
Lambda free light chains: 1.82 mg/dL (ref 0.57–2.63)

## 2010-08-01 LAB — CREATININE CLEARANCE, URINE, 24 HOUR
Collection Interval-CRCL: 24 hours
Creatinine Clearance: 52 mL/min — ABNORMAL LOW (ref 75–125)
Creatinine, 24H Ur: 1438 mg/d (ref 800–2000)
Creatinine, Urine: 63.91 mg/dL
Creatinine: 1.93 mg/dL — ABNORMAL HIGH (ref 0.4–1.5)

## 2010-08-01 LAB — UIFE/LIGHT CHAINS/TP QN, 24-HR UR
Albumin, U: DETECTED
Alpha 1, Urine: DETECTED — AB
Alpha 2, Urine: DETECTED — AB
Free Kappa/Lambda Ratio: 17.81 ratio — ABNORMAL HIGH (ref 0.46–4.00)
Free Lt Chn Excr Rate: 212.4 mg/d
Total Protein, Urine: 60.6 mg/dL
Volume, Urine: 2250 mL

## 2010-08-01 LAB — IMMUNOFIXATION, URINE

## 2010-08-05 LAB — CBC
MCHC: 35.7 g/dL (ref 30.0–36.0)
RBC: 3.97 MIL/uL — ABNORMAL LOW (ref 4.22–5.81)
WBC: 6.4 10*3/uL (ref 4.0–10.5)

## 2010-08-05 LAB — COMPREHENSIVE METABOLIC PANEL
ALT: 22 U/L (ref 0–53)
AST: 23 U/L (ref 0–37)
CO2: 28 mEq/L (ref 19–32)
Calcium: 9.5 mg/dL (ref 8.4–10.5)
GFR calc Af Amer: 39 mL/min — ABNORMAL LOW (ref >60)
GFR calc non Af Amer: 32 mL/min — ABNORMAL LOW (ref >60)
Sodium: 138 mEq/L (ref 135–145)

## 2010-08-05 LAB — DIFFERENTIAL
Eosinophils Absolute: 0.1 10*3/uL (ref 0.0–0.7)
Eosinophils Relative: 1 % (ref 0–5)
Lymphs Abs: 2 10*3/uL (ref 0.7–4.0)
Monocytes Relative: 7 % (ref 3–12)

## 2010-08-05 LAB — PROTEIN ELECTROPH W RFLX QUANT IMMUNOGLOBULINS
Albumin ELP: 68.2 % — ABNORMAL HIGH (ref 55.8–66.1)
Alpha-1-Globulin: 3.9 % (ref 2.9–4.9)
Total Protein ELP: 7 g/dL (ref 6.0–8.3)

## 2010-08-05 LAB — IMMUNOFIXATION ADD-ON

## 2010-08-06 LAB — BASIC METABOLIC PANEL
CO2: 27 mEq/L (ref 19–32)
Chloride: 107 mEq/L (ref 96–112)
Glucose, Bld: 103 mg/dL — ABNORMAL HIGH (ref 70–99)
Potassium: 5.1 mEq/L (ref 3.5–5.1)
Sodium: 140 mEq/L (ref 135–145)

## 2010-08-06 LAB — DIFFERENTIAL
Basophils Absolute: 0 10*3/uL (ref 0.0–0.1)
Basophils Relative: 0 % (ref 0–1)
Eosinophils Relative: 2 % (ref 0–5)
Lymphocytes Relative: 34 % (ref 12–46)
Monocytes Absolute: 0.4 10*3/uL (ref 0.1–1.0)

## 2010-08-06 LAB — CBC
HCT: 38.1 % — ABNORMAL LOW (ref 39.0–52.0)
Hemoglobin: 12.9 g/dL — ABNORMAL LOW (ref 13.0–17.0)
MCHC: 34 g/dL (ref 30.0–36.0)
RDW: 14.3 % (ref 11.5–15.5)

## 2010-08-18 LAB — DIFFERENTIAL
Basophils Absolute: 0 10*3/uL (ref 0.0–0.1)
Basophils Relative: 0 % (ref 0–1)
Eosinophils Absolute: 0.1 10*3/uL (ref 0.0–0.7)
Lymphs Abs: 1.3 10*3/uL (ref 0.7–4.0)
Neutrophils Relative %: 64 % (ref 43–77)

## 2010-08-18 LAB — BASIC METABOLIC PANEL
BUN: 36 mg/dL — ABNORMAL HIGH (ref 6–23)
Chloride: 105 mEq/L (ref 96–112)
Creatinine, Ser: 2.25 mg/dL — ABNORMAL HIGH (ref 0.4–1.5)
Glucose, Bld: 94 mg/dL (ref 70–99)

## 2010-08-18 LAB — PROTEIN ELECTROPH W RFLX QUANT IMMUNOGLOBULINS
Albumin ELP: 67.2 % — ABNORMAL HIGH (ref 55.8–66.1)
Alpha-1-Globulin: 4.3 % (ref 2.9–4.9)
Alpha-2-Globulin: 9.6 % (ref 7.1–11.8)
Beta 2: 2.5 % — ABNORMAL LOW (ref 3.2–6.5)
Beta Globulin: 6 % (ref 4.7–7.2)
Gamma Globulin: 10.4 % — ABNORMAL LOW (ref 11.1–18.8)

## 2010-08-18 LAB — CBC
MCV: 102.9 fL — ABNORMAL HIGH (ref 78.0–100.0)
Platelets: 79 10*3/uL — ABNORMAL LOW (ref 150–400)
RDW: 13.9 % (ref 11.5–15.5)
WBC: 4.6 10*3/uL (ref 4.0–10.5)

## 2010-08-18 LAB — IMMUNOFIXATION ADD-ON

## 2010-08-18 LAB — IGG, IGA, IGM: IgG (Immunoglobin G), Serum: 637 mg/dL — ABNORMAL LOW (ref 694–1618)

## 2010-08-19 LAB — URINE MICROSCOPIC-ADD ON

## 2010-08-19 LAB — URINALYSIS, ROUTINE W REFLEX MICROSCOPIC
Bilirubin Urine: NEGATIVE
Glucose, UA: 100 mg/dL — AB
Ketones, ur: NEGATIVE mg/dL
Protein, ur: 30 mg/dL — AB
Urobilinogen, UA: 0.2 mg/dL (ref 0.0–1.0)

## 2010-08-19 LAB — CBC
HCT: 38.9 % — ABNORMAL LOW (ref 39.0–52.0)
MCV: 102.1 fL — ABNORMAL HIGH (ref 78.0–100.0)
Platelets: 73 10*3/uL — ABNORMAL LOW (ref 150–400)
RBC: 3.81 MIL/uL — ABNORMAL LOW (ref 4.22–5.81)
WBC: 6.9 10*3/uL (ref 4.0–10.5)

## 2010-08-19 LAB — DIFFERENTIAL
Basophils Relative: 0 % (ref 0–1)
Eosinophils Absolute: 0 10*3/uL (ref 0.0–0.7)
Eosinophils Relative: 0 % (ref 0–5)
Lymphs Abs: 0.4 10*3/uL — ABNORMAL LOW (ref 0.7–4.0)

## 2010-08-19 LAB — COMPREHENSIVE METABOLIC PANEL
ALT: 20 U/L (ref 0–53)
AST: 24 U/L (ref 0–37)
Alkaline Phosphatase: 43 U/L (ref 39–117)
CO2: 24 mEq/L (ref 19–32)
Calcium: 9 mg/dL (ref 8.4–10.5)
Chloride: 107 mEq/L (ref 96–112)
GFR calc Af Amer: 37 mL/min — ABNORMAL LOW (ref >60)
GFR calc non Af Amer: 31 mL/min — ABNORMAL LOW (ref >60)
Glucose, Bld: 153 mg/dL — ABNORMAL HIGH (ref 70–99)
Potassium: 5.4 mEq/L — ABNORMAL HIGH (ref 3.5–5.1)
Sodium: 140 mEq/L (ref 135–145)
Total Bilirubin: 0.7 mg/dL (ref 0.3–1.2)

## 2010-08-20 LAB — BASIC METABOLIC PANEL
CO2: 29 mEq/L (ref 19–32)
Calcium: 10.1 mg/dL (ref 8.4–10.5)
Creatinine, Ser: 2.07 mg/dL — ABNORMAL HIGH (ref 0.4–1.5)
GFR calc Af Amer: 40 mL/min — ABNORMAL LOW (ref >60)
GFR calc non Af Amer: 33 mL/min — ABNORMAL LOW (ref >60)
Glucose, Bld: 103 mg/dL — ABNORMAL HIGH (ref 70–99)

## 2010-08-20 LAB — DIFFERENTIAL
Basophils Absolute: 0 10*3/uL (ref 0.0–0.1)
Basophils Relative: 0 % (ref 0–1)
Neutro Abs: 3.5 10*3/uL (ref 1.7–7.7)
Neutrophils Relative %: 57 % (ref 43–77)

## 2010-08-20 LAB — CBC
MCHC: 34.3 g/dL (ref 30.0–36.0)
RBC: 3.77 MIL/uL — ABNORMAL LOW (ref 4.22–5.81)
RDW: 13.1 % (ref 11.5–15.5)

## 2010-08-21 LAB — DIFFERENTIAL
Eosinophils Relative: 1 % (ref 0–5)
Lymphocytes Relative: 24 % (ref 12–46)
Lymphs Abs: 1.4 10*3/uL (ref 0.7–4.0)
Monocytes Absolute: 0.3 10*3/uL (ref 0.1–1.0)

## 2010-08-21 LAB — CBC
HCT: 37.6 % — ABNORMAL LOW (ref 39.0–52.0)
Hemoglobin: 13 g/dL (ref 13.0–17.0)
RBC: 3.6 MIL/uL — ABNORMAL LOW (ref 4.22–5.81)
WBC: 6 10*3/uL (ref 4.0–10.5)

## 2010-08-21 LAB — BASIC METABOLIC PANEL
GFR calc non Af Amer: 37 mL/min — ABNORMAL LOW (ref >60)
Potassium: 3.6 mEq/L (ref 3.5–5.1)
Sodium: 142 mEq/L (ref 135–145)

## 2010-08-23 LAB — DIFFERENTIAL
Basophils Relative: 0 % (ref 0–1)
Eosinophils Absolute: 0.1 10*3/uL (ref 0.0–0.7)
Monocytes Relative: 7 % (ref 3–12)
Neutrophils Relative %: 62 % (ref 43–77)

## 2010-08-23 LAB — BASIC METABOLIC PANEL
BUN: 26 mg/dL — ABNORMAL HIGH (ref 6–23)
CO2: 29 mEq/L (ref 19–32)
Chloride: 105 mEq/L (ref 96–112)
Creatinine, Ser: 2.08 mg/dL — ABNORMAL HIGH (ref 0.4–1.5)
GFR calc Af Amer: 39 mL/min — ABNORMAL LOW (ref >60)

## 2010-08-23 LAB — CBC
MCHC: 35.1 g/dL (ref 30.0–36.0)
MCV: 104 fL — ABNORMAL HIGH (ref 78.0–100.0)
RBC: 3.51 MIL/uL — ABNORMAL LOW (ref 4.22–5.81)

## 2010-08-25 LAB — DIFFERENTIAL
Basophils Absolute: 0 10*3/uL (ref 0.0–0.1)
Lymphocytes Relative: 34 % (ref 12–46)
Neutro Abs: 2.5 10*3/uL (ref 1.7–7.7)
Neutrophils Relative %: 56 % (ref 43–77)

## 2010-08-25 LAB — CBC
Platelets: 73 10*3/uL — ABNORMAL LOW (ref 150–400)
RDW: 13.4 % (ref 11.5–15.5)

## 2010-08-25 LAB — BASIC METABOLIC PANEL
BUN: 32 mg/dL — ABNORMAL HIGH (ref 6–23)
Calcium: 9.4 mg/dL (ref 8.4–10.5)
Creatinine, Ser: 2.19 mg/dL — ABNORMAL HIGH (ref 0.4–1.5)
GFR calc non Af Amer: 31 mL/min — ABNORMAL LOW (ref >60)
Glucose, Bld: 86 mg/dL (ref 70–99)
Sodium: 141 mEq/L (ref 135–145)

## 2010-08-27 LAB — BASIC METABOLIC PANEL
Chloride: 104 mEq/L (ref 96–112)
GFR calc Af Amer: 43 mL/min — ABNORMAL LOW (ref >60)
GFR calc non Af Amer: 35 mL/min — ABNORMAL LOW (ref >60)
Potassium: 4.1 mEq/L (ref 3.5–5.1)

## 2010-08-27 LAB — DIFFERENTIAL
Eosinophils Absolute: 0.1 10*3/uL (ref 0.0–0.7)
Eosinophils Relative: 2 % (ref 0–5)
Lymphocytes Relative: 31 % (ref 12–46)
Lymphs Abs: 1.8 10*3/uL (ref 0.7–4.0)
Monocytes Relative: 7 % (ref 3–12)
Neutrophils Relative %: 60 % (ref 43–77)

## 2010-08-27 LAB — CBC
HCT: 36.3 % — ABNORMAL LOW (ref 39.0–52.0)
MCV: 103.8 fL — ABNORMAL HIGH (ref 78.0–100.0)
RBC: 3.5 MIL/uL — ABNORMAL LOW (ref 4.22–5.81)
WBC: 5.8 10*3/uL (ref 4.0–10.5)

## 2010-08-28 LAB — BASIC METABOLIC PANEL
CO2: 27 mEq/L (ref 19–32)
Calcium: 9.6 mg/dL (ref 8.4–10.5)
Chloride: 104 mEq/L (ref 96–112)
Glucose, Bld: 97 mg/dL (ref 70–99)
Sodium: 138 mEq/L (ref 135–145)

## 2010-08-28 LAB — DIFFERENTIAL
Basophils Absolute: 0 10*3/uL (ref 0.0–0.1)
Basophils Relative: 0 % (ref 0–1)
Eosinophils Absolute: 0 10*3/uL (ref 0.0–0.7)
Eosinophils Relative: 1 % (ref 0–5)
Monocytes Absolute: 0.3 10*3/uL (ref 0.1–1.0)
Monocytes Relative: 8 % (ref 3–12)
Neutro Abs: 2.9 10*3/uL (ref 1.7–7.7)

## 2010-08-28 LAB — CBC
Hemoglobin: 11.7 g/dL — ABNORMAL LOW (ref 13.0–17.0)
MCHC: 35 g/dL (ref 30.0–36.0)
MCV: 103.2 fL — ABNORMAL HIGH (ref 78.0–100.0)
RBC: 3.24 MIL/uL — ABNORMAL LOW (ref 4.22–5.81)
RDW: 14.2 % (ref 11.5–15.5)

## 2010-09-02 LAB — BASIC METABOLIC PANEL
BUN: 28 mg/dL — ABNORMAL HIGH (ref 6–23)
Calcium: 9.9 mg/dL (ref 8.4–10.5)
Creatinine, Ser: 1.99 mg/dL — ABNORMAL HIGH (ref 0.4–1.5)
GFR calc non Af Amer: 34 mL/min — ABNORMAL LOW (ref >60)
Glucose, Bld: 97 mg/dL (ref 70–99)

## 2010-09-02 LAB — DIFFERENTIAL
Basophils Absolute: 0.1 10*3/uL (ref 0.0–0.1)
Eosinophils Relative: 2 % (ref 0–5)
Lymphs Abs: 1.5 10*3/uL (ref 0.7–4.0)
Monocytes Relative: 7 % (ref 3–12)
Neutrophils Relative %: 60 % (ref 43–77)

## 2010-09-02 LAB — CBC
HCT: 35.6 % — ABNORMAL LOW (ref 39.0–52.0)
Platelets: 63 10*3/uL — ABNORMAL LOW (ref 150–400)
RDW: 14.6 % (ref 11.5–15.5)
WBC: 4.9 10*3/uL (ref 4.0–10.5)

## 2010-09-30 NOTE — H&P (Signed)
[name], [name]               ACCOUNT NO.:  [id number]   MEDICAL RECORD NO.:  [id number]          PATIENT TYPE:  INP   LOCATION:  A306                          FACILITY:  [facility]   PHYSICIAN:  Margaretmary Dys, M.D.DATE OF BIRTH:  1945-11-20   DATE OF ADMISSION:  10/11/2007  DATE OF DISCHARGE:  LH                              HISTORY & PHYSICAL   ADMISSION DIAGNOSES:  1. Syncope.  2. Fractured nasal bone.  3. Chronic anemia with pancytopenia.  4. Multiple myelomas since 2006.  5. Acute on chronic renal insufficiency.  6. Multiple traumas to the forehead with frontal hematoma.   CHIEF COMPLAINT:  The patient fell this evening while going for a walk.   HISTORY OF PRESENT ILLNESS:  [name] is a 65 year old male with  history of multiple myeloma who apparently has been doing fairly well  since 2006 when he was diagnosed.  The patient said he was going for a  walk today which he has been doing trying to exercise and keep fit.  The  patient although felt he may have pushed himself a little harder today.  While he was walking at about 7:25 p.m. he felt very tired and fell.  Apparently patient landed face first, and he could not break his fall.  The patient denies any loss of consciousness.  He just said he was too  weak and lethargic.  The patient apparently may have also vomited when  this occurred.  EMS was called by the brother who was also walking with  him at the time.  The patient was subsequently placed in a backboard  with cervical collar.  Brought to the emergency room.  Through all this  the patient said he remembered he did not have any loss of  consciousness.  He did not have any significant headache other than  feeling banged up.  The patient reports that these symptoms were  followed by nausea and vomiting.  He denies any prior symptoms.  The  patient was not so lightheaded, not dizzy.  Even when his anemia was  said to be pretty low the patient said he was not  really symptomatic.  The patient has chronic thrombocytopenia, and has received platelet  transfusions per the oncologist, Dr. Mariel Sleet.   In the emergency room the patient was evaluated and did not have neck  injury for which his cervical collar was discontinued.  However, patient  had nasal septum fractures which will need further evaluation.  He also  had some nasal bleed secondary to these fractures.  The nose bleeds are  better controlled now with ice packs.   The patient will be evaluated by ENT surgery.   REVIEW OF SYSTEMS:  Otherwise, negative except as mentioned in History  of Present Illness.   PAST MEDICAL HISTORY:  1. Hypertension.  2. Anemia.  3. History of multiple myeloma.  4. History of chronic renal failure secondary to multiple myeloma.  5. History of thrombocytopenia.  It appears patient's stem cells are      at [facility].   ALLERGIES:  The patient reports no known drug  allergies.   CURRENT MEDICATIONS:  1. Lisinopril 10 mg p.o. daily.  2. Calcium carbonate 600 mg twice a day.  3. Multivitamin one tablet once a day.  4. Sodium bicarbonate 650 mg p.o. t.i.d.  5. Aspirin 81 mg p.o. once a day.  6. Phenergan 25 mg as needed.  7. Aspirin 81 mg once a day.  8. Septra oral on Thursday and Monday.  9. Folic acid 1 mg p.o. daily.  10.Verapamil 240 mg p.o. once a day.   SOCIAL HISTORY:  The patient is single.   The patient denies any alcohol use or smoking history.   FAMILY HISTORY:  Positive for hypertension, coronary artery disease.   PHYSICAL EXAMINATION:  GENERAL:  Alert, well-oriented in time, place,  and person.  VITAL SIGNS:  Blood pressure 136/76, pulse 50, respiratory rate 20,  temperature 97.5 degrees Fahrenheit, O2 sat shows 100% on room air.  HEENT:  Normocephalic.  The patient has multiple bruises and abrasions  on the forehead with frontal hematoma.  His nasal septum appears  deformed, and patient was having occasional dark blood from  his nostril.  Oral mucosa was moist with no evidence of bleeding noted.  NECK:  Supple, no JVD, no lymphadenopathy.  LUNGS:  Decreased air entry bilaterally, no crackles, wheezes, no  rhonchi was heard.  HEART:  S1, S2 regular, no gallops or rubs.  ABDOMEN:  Soft, nontender, bowel sounds positive.  No masses palpable.  EXTREMITIES:  No pitting edema, no bruises or abrasions were noted.  SKIN:  As mentioned above.  NEUROLOGIC:  Grossly intact with no focal neurological deficits.   LABORATORY/DIAGNOSTIC DATA:  White blood cell count 2.8, hemoglobin 9.4,  hematocrit 26.5, his platelet count was 78.  There was no left shift.  Sodium 139, potassium 4.7, chloride 105, CO2 23, glucose 111, BUN 42,  creatinine 2.68.   AST and ALT were normal.  Lipase was only 31.   Creatinine kinase 350.  Troponins were negative.  Urinalysis only shows  trace amount of blood.  Multiple CT scans were obtained and x-rays of his chest.  Chest x-ray  shows no acute cardiopulmonary disease.  A maxillofacial CT scan showed  comminuted osseous nasoseptal fracture with blood in his nasal cavity  and sphenoid sinuses bilaterally, no actual fractures were noted.   Head CT scan did not show any evidence of an acute brain bleed.  There  was some stable bilateral ganglia lacunar infarct and subcortical white  matter.   CT scan of his cervical spine showed no evidence for acute fracture or  subluxation.   PLAN:  A 65 year old male presents to the emergency room after  sustaining what appears to be a fall.  After patient was exhausted and  tired today.  The patient likely had a syncopal episode.   1. The patient admitted to medical floor with Telemetry to follow and      monitor his heart rhythm.  A 12-lead EKG was unremarkable, and his      cardiac enzymes are negative thus far.  2. The patient has workup for syncope including carotid Dopplers and a      2D echocardiogram in the morning.  However, additional workup  will      be held at this time.  I do think that patient gave a good reason      for the fall , but will still make sure we have all the facts.  3. The patient has pancytopenia likely from his multiple  myeloma.  I      do not see any indication for blood transfusion or platelet      transfusion at this time.  However, if patient continues to bleed      from his nose I think it would be reasonable to put him on some      platelet transfusion.  4. I do not see any evidence of an acute infectious process.  Will      continue to monitor.  5. Acute renal insufficiency.  Will give some IV fluids to see how he      does.  6. The patient will need ENT evaluation for his nasal septal fracture.      I will discuss with Dr. Elige Radon in the morning on a possible      consultation.  It is not clear if we have an ENT surgeon available      here to see patient.  7. I will be resuming all his home medications at this time.  However,      will watch his blood pressure closely to ensure that this is not      related to any hypotensive episode.  Again as mentioned above      patient was admitted to telemetry to rule out any other concerns      with his heart.   I have explained the above plan to the patient.  The patient was fairly  lucid, and verbalized full understanding and agreed.      Margaretmary Dys, M.D.  Electronically Signed     AM/MEDQ  D:  10/12/2007  T:  10/12/2007  Job:  [id number]

## 2010-09-30 NOTE — Consult Note (Signed)
NAMEREECE, FEHNEL               ACCOUNT NO.:  [id number]   MEDICAL RECORD NO.:  [id number]          PATIENT TYPE:  INP   LOCATION:  A306                          FACILITY:  [facility]   PHYSICIAN:  Jorja Loa, M.D.DATE OF BIRTH:  12/12/1945   DATE OF CONSULTATION:  DATE OF DISCHARGE:                                 CONSULTATION   HISTORY OF PRESENT ILLNESS:  Mr. Bowdish is 65 year old gentleman with  past medical history of multiple myeloma, history of chronic renal  failure now presently came with history of fall and bleeding from his  face.  According to the patient, he has been walking with his brother on  a trailer when he fell down and sustained some injury to his nose and  forehead.  According to the patient, he denies any history of dizziness,  lightheadedness, or loss of consciousness, but according to him he just  became very weak and then he fell face down.  At this moment, he denies  pain, he denies also headache.  Now, presently consult is called because  of elevated BUN and creatinine.   PAST MEDICAL HISTORY:  1. History of hypertension.  2. 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  3. History of  anemia and thrombocytopenia secondary to possible      medication.  4. History of possible metabolic acidosis.  5. History of chronic renal failure, possibly stage III.   MEDICATIONS:  His medications as an outpatient consist of:  1. Lisinopril 10 mg p.o. daily.  2. Calcium carbonate 600 mg up to b.i.d.  3. Sodium bicarbonate 650 mg p.o. t.i.d.  4. Aspirin 81 mg p.o. daily.  5. Phenergan 25 mg p.o. daily.  6. Septra oral Thursday and Monday.  7. Folic acid 1 mg p.o. daily.  8. Verapamil 240 mg p.o. daily.  9. Kayexalate 15 g twice a week.  10.Flutamide 50 mg once every other day.   SOCIAL HISTORY:  No history of alcohol abuse.  No history of smoking.   ALLERGIES:  No known allergies.   FAMILY HISTORY:  No history of renal failure and  __________ coronary  artery disease.   REVIEW OF SYSTEMS:  He denies any nausea and vomiting.  Appetite is  reasonable.  He complains of some pain, but does seem to be getting  better after he was given some pain medication.  He denies any chest  pain and also he also does not have any nausea or vomiting.   PHYSICAL EXAMINATION:  VITAL SIGNS:  His temperature is 97.6, blood  pressure 125/79, and pulse of 60.  HEENT:  He has some bruise on his forehead and also on nasal bridge.  CHEST:  Clear to auscultation.  HEART:  Regular rate and rhythm. No murmur. No S3.  ABDOMEN:  Soft, positive bowel sounds.  EXTREMITIES:  No edema.   LABORATORY DATA:  His white blood cell count is 5.1, hemoglobin 9.6,  hematocrit 27.5, and platelet of 79.  When he came, his white blood cell  count was about 12.8.  His sodium 141, potassium 4.2, BUN 48, creatinine  is 2.34.  His albumin is 3.7, calcium 8.1.  His CPK is 266 and  __________ index is 0.4and troponin is 0.04.   ASSESSMENT:  1. Renal insufficiency this movement in a chronic.  This is related to      his multiple myeloma.  Initially, when he came, had creatinine      about 16 and at that time, he has also hypercalcemia and multiple      myeloma.  The patient was transferred to [facility] where he      received some hemodialysis and received also a bone marrow      transplant.  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  2. History of  multiple myeloma status post chemotherapy status post      bone marrow transplant that seems to be in remission.  3. History of  anemia a part of pan cytopenia, the patient with low      __________ and white blood cells, probably related to his      chemotherapy.  4. History of  metabolic acidosis on sodium carbonate, CO2 is 27.  5. History of  hypertension.  Presently, the patient is on verapamil      and he  was getting lisinopril 10 mg p.o. daily.  Presently he is      not on any __________ medication.  Blood pressure seems to be      stable.  6. History of dizziness __________ etiologies.  His blood pressure      seems to be reasonable.  Probably may be an accidental fall.  7. History of gastroesophageal reflux disease.  8. Hyperkalemia.  The patient managed with Kayexalate 30 gm 2 times a      week.  His potassium seems to be normal.   RECOMMENDATIONS:  I agree with hydration.  We will just follow the  patient.  At this movement, the patient may need to be back on his  Flutamide and probably his antihypertensive medication may need to be  restarted.  If his potassium is high, possibly he will be put back on  Kayexalate, but for now we will continue on his present management and  follow the patient.      Jorja Loa, M.D.  Electronically Signed     BB/MEDQ  D:  10/12/2007  T:  10/13/2007  Job:  [id number]

## 2010-09-30 NOTE — Discharge Summary (Signed)
NAMEGAUDENCIO, CHESNUT               ACCOUNT NO.:  [id number]   MEDICAL RECORD NO.:  [id number]          PATIENT TYPE:  INP   LOCATION:  A306                          FACILITY:  [facility]   PHYSICIAN:  Dorris Singh, DO    DATE OF BIRTH:  03/21/1946   DATE OF ADMISSION:  10/11/2007  DATE OF DISCHARGE:  05/28/2009LH                               DISCHARGE SUMMARY   ADMISSION DIAGNOSES:  1. Syncope.  2. Fractured nasal bone.  3. Chronic anemia with pancytopenia.  4. Multiple myelomas since 2006.  5. Acute on chronic renal insufficiency.  6. Multiple traumas to the forehead and frontal hematoma.   DISCHARGE DIAGNOSES:  1. Fractured nasal bone.  2. Chronic renal insufficiency.  3. Chronic anemia with pancytopenia.  4. Multiple myelomas since 2006.  5. Multiple traumas to the forehead with frontal hematoma.  6. Syncopal episode, not repeated.   His H&P was done by Dr. Langston Masker, please refer the patient's hospital  course.  He was admitted to the service of [facility] where cold  compresses were placed to his facial fractures and hematomas.  Also  several tests were run while the patient was here which include an  echocardiogram which demonstrated   Dictation ended at this point.      Dorris Singh, DO  Electronically Signed     CB/MEDQ  D:  10/13/2007  T:  10/14/2007  Job:  [id number]

## 2010-09-30 NOTE — Consult Note (Signed)
NAMEARRINGTON, YOHE               ACCOUNT NO.:  [id number]   MEDICAL RECORD NO.:  [id number]          PATIENT TYPE:  INP   LOCATION:  A306                          FACILITY:  [facility]   PHYSICIAN:  Kofi A. Gerilyn Pilgrim, M.D. DATE OF BIRTH:  12-21-1945   DATE OF CONSULTATION:  10/12/2097  DATE OF DISCHARGE:  10/13/2007                                 CONSULTATION   REASON FOR CONSULTATION:  Syncope.   This is a 65 year old white male who has history of multiple myeloma.  The patient has been exercising and wanted to increase his exercise  regimen.  Really, he simply walks, but finally wanted to increase the  length of walking.  The patient reports that at the end of his walk, he  felt extremely weak and subsequently passed out falling face first to  the ground.  He was out briefly.  He again may have lost consciousness  briefly.  He does not report any focal numbness, weakness, headache,  dysarthria, or dysphagia.  There is no shortness of breath or chest  pain.  No dizziness.  The patient did have facial fractures and injuries  as a results of his fall.  He has had an extensive workup, so far  essentially has been unrevealing.   PAST MEDICAL HISTORY:  Multiple myeloma, chronic renal failure secondary  to multiple myeloma, anemia, hypertension, and thrombocytopenia.   ADMISSION MEDICATIONS:  1. Lisinopril.  2. Calcium.  3. Multivitamins.  4. Sodium bicarbonate.  5. Aspirin.  6. Phenergan.  7. Septra.  8. Folic acid.  9. Verapamil.   SOCIAL HISTORY:  He is single.  No tobacco use.  No alcohol use.   FAMILY HISTORY:  Positive for hypertension and coronary artery disease.   REVIEW OF SYSTEMS:  See the history of present illness, otherwise,  unrevealing.   PHYSICAL EXAMINATION:  GENERAL:  An overweight man, in no acute  distress.  VITAL SIGNS:  Temperature 98.2, pulse 89, respiration 20, and blood  pressure 122/82.  HEENT:  He does have multiple small superficial facial bruises  involving  the nose and facial region.  NECK:  Supple.  ABDOMEN:  Obese and soft.  EXTREMITIES:  No significant edema.  MENTATION:  The patient is awake and alert.  He converses well.  He is  lucid and coherent.  Speech, language, and cognition are intact.  CRANIAL NERVES:  Pupils are equal, round, and reactive to light and  accommodation.  Extraocular movements are intact.  Visual field are  full.  Facial muscle strength is symmetric.  Tongue is midline.  Shoulder shrug is normal.  Motor examination shows normal tone, bulk,  and strength.  There is no pronator drift.  Coordination is intact.  There is no tremors, parkinsonism, or dysmetria.  Reflexes are markedly  diminished.  Toes are downgoing.  Sensation is symmetric to temperature  and light touch.   A CT scan of the brain shows remote lacunar infarct involving the  external capsule on the right.  There is also a left caudate lacunar  remote infarct.  The patient has comminuted fracture of the nasal  septum.  There is also a high intensity material in the sphenoid sinuses  reflecting blood.  There is right scalp hematoma.  Cervical spine MRI  shows no fractures.  There are postoperative changes with fusion  involving C4 to T1 and laminectomies from C4 to C7.  The patient reports  having a fall with significant herniation and fractures requiring  surgery.  Carotid Doppler is negative.  Urinalysis is negative.  Sodium  139, potassium 4.7, chloride 105, CO2 of 23, BUN 42, creatinine 2.6, and  glucose 111.  LFTs normal.  Calcium 9.1.  WBC 2.8, hemoglobin 9.4, and  platelet count of 78.   ASSESSMENT:  1. Syncope, I suspect that the most likely etiology is from      overexertion with presyncope and falling to the ground.  I see no      evidence of neurologic etiology based upon the semiology and      physical examination.  2. 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  This is      likely due to multiple myeloma.  Other  causes probably should be      evaluated for.   RECOMMENDATIONS:  No further recommendation is suggested for workup of  the syncope from a neurological stand point.  He is on folic acid, but  should be checked for B12 deficiency as a treatable cause of neuropathy.      Kofi A. Gerilyn Pilgrim, M.D.  Electronically Signed     KAD/MEDQ  D:  10/14/2007  T:  10/14/2007  Job:  [id number]

## 2010-09-30 NOTE — Discharge Summary (Signed)
[name], [name]               ACCOUNT NO.:  [id number]   MEDICAL RECORD NO.:  [id number]          PATIENT TYPE:  INP   LOCATION:  A306                          FACILITY:  [facility]   PHYSICIAN:  Dorris Singh, DO    DATE OF BIRTH:  02-29-1946   DATE OF ADMISSION:  10/11/2007  DATE OF DISCHARGE:  05/28/2009LH                               DISCHARGE SUMMARY   ADMISSION DIAGNOSIS:  Syncope, fractured nasal bone, chronic anemia with  pancytopenia, multiple myeloma since 2006, acute on chronic renal  insufficiency, multiple traumas to the forehead with a frontal hematoma.   DISCHARGE DIAGNOSES:  Fractured nasal bone, chronic renal insufficiency,  multiple traumas to the forehead, fractured nasal bone, multiple myeloma  since 2006, and only syncopal episode times one.   H&P:  Was done by Dr. Sherle Poe.   PRIMARY CARE PHYSICIAN:  Dr. Nobie Putnam.   Please refer to his H&P.  To summarize, the patient is a 65 year old man  who had a single episode where he fell while he was walking.  He was  then brought to the emergency room.  He had multiple facial fractures.  He was then admitted to the service of [facility] where he had multiple  tests ordered which included a CT of the head on May 26th, prior to  admission, which demonstrated comminuted fracture to the nasal osseous  septum without additional fractures.  High density material area seen in  the sphenoid sinuses, likely reflects blood, and right frontal scalp  hematoma with underlying fracture, and stable bilateral nasal ganglia  with chronic lacunar infarcts and subcortical white matter hypo-  attenuating bilaterally.  His CT of the C-spine showed no evidence of  fracture or subluxation and postoperative changes of prior C4-T1 fusion  and laminectomy of C4-C7.  Bilateral osseous foraminal narrowing is  noted.  His two-view chest x-ray demonstrated no acute cardiopulmonary  disease, and his carotid Doppler demonstrated early bilateral  carotid  bifurcation plaque without significant stenosis.  Exam did not exclude  plaque ulceration embolization.  Continued surveillance recommended.  He  also had a 2-D echo done May 27 which demonstrated overall left  ventricular systolic function was normal, left ventricle ejection  fraction was estimated 60%, left ventricular wall thickness was mildly  increased, calcification of noncoronary cusp.  Also he was seen by Dr.  Kristian Covey for his renal insufficiency.  He was placed on IV fluids and  that continued to improve.  He was also seen by Dr. Gerilyn Pilgrim.  Based on  his history, doubt that his syncope is due to a neurologic cause.  It  could be due to overexertion and so we will just continue to monitor him  as well.  Dr. Gerilyn Pilgrim just recommended that he follow up with his  oncologist.  He has a bone marrow aspiration scheduled Dr. Mariel Sleet and  he is to follow up with him at his next scheduled appointment.  The  patient will be sent home on his current meds, Calan SR 20 mg p.o.  daily, lisinopril 20 mg p.o. daily, multivitamin p.o. daily, gabapentin  300 mg p.o. daily,  aspirin 81 mg p.o. daily, calcium 600 mg p.o. daily,  sodium bicarb 1 to 3 times a day, thalami 50 mg p.o. every other night,  STS 15 grams 60 mL suspension 2 times, times 1 week.  We are not placed  him on any new medications at this point.  The patient has been anxious  to go home since admission, so we will go ahead and discharge him with  close followup with his primary care physician.  It is recommended he  see [place] and we will set up an appointment within 1 to 2 days.  Also the  patient would like to see an [facility] physician, ENT, upon his discharge.  Spoke with Dr. Annalee Genta at [facility] regarding the patient's CT.  It was recommended that the patient use the given specific  recommendations, and the patient is to follow up with an ENT within 5  days after his swelling has decreased.  The patient has chosen to go  to  [facility] ENT.   DISCHARGE INSTRUCTIONS:  Will be to increase activity slowly.  Wound  care.  He is to keep face clean and dry.  Also he is to follow up and we  will set him an appointment up with [facility]NT and also Dr. Nobie Putnam  specifically.  He is to use nasal spray every 15 minutes p.r.n. he is  not to blow his nose.  This was one of the recommendations per ENT.  He  is to use Tylenol for pain, cold compresses to his face every 1 to 2  hours as needed to decrease his swelling.  He is to take his medications  as recommended before.  If symptoms worsen, it is recommended that he  return to [facility] emergency room for evaluation.      Dorris Singh, DO  Electronically Signed     CB/MEDQ  D:  10/13/2007  T:  10/13/2007  Job:  [id number]   cc:   Patrica Duel, M.D.  Fax: [phone]

## 2010-10-03 ENCOUNTER — Other Ambulatory Visit (HOSPITAL_COMMUNITY): Payer: Self-pay | Admitting: Oncology

## 2010-10-03 ENCOUNTER — Encounter (HOSPITAL_COMMUNITY): Payer: Medicare Other | Attending: Oncology

## 2010-10-03 DIAGNOSIS — C9000 Multiple myeloma not having achieved remission: Secondary | ICD-10-CM | POA: Insufficient documentation

## 2010-10-03 LAB — PROTEIN, URINE, 24 HOUR
Collection Interval-UPROT: 24 hours
Protein, 24H Urine: 1404 mg/d — ABNORMAL HIGH (ref 50–100)
Protein, Urine: 72 mg/dL

## 2010-10-03 LAB — DIFFERENTIAL
Band Neutrophils: 1 % (ref 0–10)
Basophils Absolute: 0 10*3/uL (ref 0.0–0.1)
Blasts: 0 %
Metamyelocytes Relative: 0 %
Monocytes Absolute: 0.7 10*3/uL (ref 0.1–1.0)
Promyelocytes Absolute: 0 %

## 2010-10-03 LAB — CREATININE CLEARANCE, URINE, 24 HOUR
Collection Interval-CRCL: 24 hours
Creatinine Clearance: 47 mL/min — ABNORMAL LOW (ref 75–125)
Creatinine, Urine: 94.7 mg/dL

## 2010-10-03 LAB — CBC
HCT: 37.3 % — ABNORMAL LOW (ref 39.0–52.0)
Hemoglobin: 12.6 g/dL — ABNORMAL LOW (ref 13.0–17.0)
MCV: 101.1 fL — ABNORMAL HIGH (ref 78.0–100.0)
RBC: 3.69 MIL/uL — ABNORMAL LOW (ref 4.22–5.81)
RDW: 13.1 % (ref 11.5–15.5)
WBC: 5.5 10*3/uL (ref 4.0–10.5)

## 2010-10-03 LAB — COMPREHENSIVE METABOLIC PANEL
AST: 26 U/L (ref 0–37)
Albumin: 3.6 g/dL (ref 3.5–5.2)
Alkaline Phosphatase: 44 U/L (ref 39–117)
BUN: 36 mg/dL — ABNORMAL HIGH (ref 6–23)
CO2: 22 mEq/L (ref 19–32)
Chloride: 108 mEq/L (ref 96–112)
Creatinine, Ser: 2.72 mg/dL — ABNORMAL HIGH (ref 0.4–1.5)
GFR calc non Af Amer: 24 mL/min — ABNORMAL LOW (ref >60)
Potassium: 4.5 mEq/L (ref 3.5–5.1)
Total Bilirubin: 0.5 mg/dL (ref 0.3–1.2)

## 2010-10-03 NOTE — Op Note (Signed)
NAMEBRAHM, BARBEAU               ACCOUNT NO.:  [id number]   MEDICAL RECORD NO.:  [id number]          PATIENT TYPE:  AMB   LOCATION:  DAY                           FACILITY:  [facility]   PHYSICIAN:  Dalia Heading, M.D.  DATE OF BIRTH:  Jul 18, 1945   DATE OF PROCEDURE:  07/05/2006  DATE OF DISCHARGE:                               OPERATIVE REPORT   PREOPERATIVE DIAGNOSIS:  Left inguinal hernia.   POSTOPERATIVE DIAGNOSIS:  Left inguinal hernia, indirect.   PROCEDURE:  Left inguinal herniorrhaphy.   SURGEON:  Dr. Franky Macho   ANESTHESIA:  General.   INDICATIONS:  The patient is a 65 year old white male who presents with  symptomatic left inguinal hernia.  The risks and benefits of the  procedure including bleeding, infection, pain, and recurrence of the  hernia were fully explained to the patient, gave informed consent.   PROCEDURE NOTE:  The patient was placed in supine position.  After  general anesthesia was administered, the left groin region was prepped  and draped using the usual sterile technique with Betadine.  Surgical  site confirmation was performed.   A transverse incision was made in the left groin region down to the  external oblique aponeurosis.  The aponeurosis was incised to the  external ring.  Penrose drain was placed around the spermatic cord.  The  vas deferens was noted within the spermatic cord.  The ilioinguinal  nerve was identified and retracted superiorly from the operative field.  An indirect hernia sac was found.  This was separated from the spermatic  cord up to the perineal reflection and inverted.  Large-size  polypropylene mesh plug was then placed into this region and secured to  the transversalis fascia using a 2-0 Novofil interrupted suture.  An  onlay polypropylene mesh patch was then placed along the floor of the  inguinal canal and secured superiorly to the conjoined tendon and  inferiorly to the shelving edge of Poupart's ligament using 2-0  Novofil  interrupted sutures.  An internal ring was recreated using a 2-0 Novofil  interrupted suture.  The external oblique aponeurosis was reapproximated  using a 2-0 Vicryl running suture.  The subcutaneous layer was  reapproximated using a 3-0 Vicryl interrupted suture.  The skin was  closed using a 4-0 Vicryl subcuticular suture.  Then 0.5% Sensorcaine  was instilled in the surrounding wound.  Dermabond was then applied.   All tape and needle counts correct at the end of the procedure.  The  patient was awakened and transferred to PACU in stable condition.  Complications none.   SPECIMEN:  None.   BLOOD LOSS:  Minimal.      Dalia Heading, M.D.  Electronically Signed     MAJ/MEDQ  D:  07/05/2006  T:  07/05/2006  Job:  [id number]   cc:   Patrica Duel, M.D.  Fax: [phone]

## 2010-10-03 NOTE — Procedures (Signed)
NAMEJEREMAIH, KLIMA               ACCOUNT NO.:  [id number]   MEDICAL RECORD NO.:  [id number]          PATIENT TYPE:  EMS   LOCATION:  ED                            FACILITY:  [facility]   PHYSICIAN:  Edward L. Juanetta Gosling, M.D.DATE OF BIRTH:  1945-06-23   DATE OF PROCEDURE:  DATE OF DISCHARGE:  01/12/2006                                EKG INTERPRETATION   TIME:  10:29 on January 12, 2006.   The rhythm is sinus rhythm with a rate in the 60s.  The axis is leftward but  does not meet criteria for left axis deviation.  There is probable left  atrial enlargement.  Somewhat slow R wave progression across the precordium  may indicate a previous anteroseptal myocardial infarction, and clinical  correlation is suggested.  There are T-wave abnormalities laterally which  were nonspecific but could indicate ischemia, and clinical correlation  suggested.  Abnormal electrocardiogram.      Oneal Deputy. Juanetta Gosling, M.D.  Electronically Signed     ELH/MEDQ  D:  01/13/2006  T:  01/14/2006  Job:  [id number]

## 2010-10-06 LAB — KAPPA/LAMBDA LIGHT CHAINS
Kappa free light chain: 39.2 mg/dL — ABNORMAL HIGH (ref 0.33–1.94)
Lambda free light chains: 0.95 mg/dL (ref 0.57–2.63)

## 2010-10-07 LAB — IMMUNOFIXATION, URINE

## 2010-10-08 LAB — MULTIPLE MYELOMA PANEL, SERUM
Albumin ELP: 67 % — ABNORMAL HIGH (ref 55.8–66.1)
Alpha-2-Globulin: 10.4 % (ref 7.1–11.8)
Beta Globulin: 5.7 % (ref 4.7–7.2)
IgA: 41 mg/dL — ABNORMAL LOW (ref 68–378)
IgM, Serum: 112 mg/dL (ref 60–263)
M-Spike, %: NOT DETECTED g/dL
Total Protein: 6.1 g/dL (ref 6.0–8.3)

## 2010-10-15 ENCOUNTER — Encounter (HOSPITAL_COMMUNITY): Payer: Medicare Other | Admitting: Oncology

## 2010-10-15 DIAGNOSIS — C9000 Multiple myeloma not having achieved remission: Secondary | ICD-10-CM

## 2010-10-24 ENCOUNTER — Emergency Department (HOSPITAL_COMMUNITY)
Admission: EM | Admit: 2010-10-24 | Discharge: 2010-10-24 | Disposition: A | Discharge status: Home / Self Care | Payer: Medicare Other | Attending: Emergency Medicine | Admitting: Emergency Medicine

## 2010-10-24 DIAGNOSIS — L03119 Cellulitis of unspecified part of limb: Secondary | ICD-10-CM | POA: Insufficient documentation

## 2010-10-24 DIAGNOSIS — Y998 Other external cause status: Secondary | ICD-10-CM | POA: Insufficient documentation

## 2010-10-24 DIAGNOSIS — L02419 Cutaneous abscess of limb, unspecified: Secondary | ICD-10-CM | POA: Insufficient documentation

## 2010-10-24 DIAGNOSIS — W2203XA Walked into furniture, initial encounter: Secondary | ICD-10-CM | POA: Insufficient documentation

## 2010-10-24 LAB — GLUCOSE, CAPILLARY: Glucose-Capillary: 123 mg/dL — ABNORMAL HIGH (ref 70–99)

## 2011-01-15 ENCOUNTER — Other Ambulatory Visit (HOSPITAL_COMMUNITY): Payer: Self-pay | Admitting: Oncology

## 2011-01-15 ENCOUNTER — Encounter (HOSPITAL_COMMUNITY): Payer: Medicare Other | Attending: Oncology

## 2011-01-15 DIAGNOSIS — C9000 Multiple myeloma not having achieved remission: Secondary | ICD-10-CM | POA: Insufficient documentation

## 2011-01-15 LAB — PROTEIN, URINE, 24 HOUR
Collection Interval-UPROT: 24 hours
Urine Total Volume-UPROT: 2100 mL

## 2011-01-15 LAB — DIFFERENTIAL
Basophils Absolute: 0 10*3/uL (ref 0.0–0.1)
Basophils Relative: 0 % (ref 0–1)
Eosinophils Relative: 1 % (ref 0–5)
Lymphocytes Relative: 30 % (ref 12–46)
Monocytes Absolute: 0.5 10*3/uL (ref 0.1–1.0)

## 2011-01-15 LAB — COMPREHENSIVE METABOLIC PANEL
ALT: 28 U/L (ref 0–53)
AST: 19 U/L (ref 0–37)
Albumin: 3.5 g/dL (ref 3.5–5.2)
Alkaline Phosphatase: 53 U/L (ref 39–117)
Calcium: 9 mg/dL (ref 8.4–10.5)
GFR calc Af Amer: 28 mL/min — ABNORMAL LOW (ref >60)
Glucose, Bld: 132 mg/dL — ABNORMAL HIGH (ref 70–99)
Potassium: 4.5 mEq/L (ref 3.5–5.1)
Sodium: 138 mEq/L (ref 135–145)
Total Protein: 6.1 g/dL (ref 6.0–8.3)

## 2011-01-15 LAB — CBC
Hemoglobin: 13 g/dL (ref 13.0–17.0)
MCH: 34.2 pg — ABNORMAL HIGH (ref 26.0–34.0)
MCHC: 34.7 g/dL (ref 30.0–36.0)
Platelets: 91 10*3/uL — ABNORMAL LOW (ref 150–400)

## 2011-01-15 LAB — CREATININE CLEARANCE, URINE, 24 HOUR
Creatinine Clearance: 45 mL/min — ABNORMAL LOW (ref 75–125)
Creatinine, 24H Ur: 1808 mg/d (ref 800–2000)
Urine Total Volume-CRCL: 2100 mL

## 2011-01-15 NOTE — Progress Notes (Signed)
Labs drawn today for cbc/diff, cmp, kllc, mm panel, 24 hour urine for total protein, CrCl, and Urine Fix

## 2011-01-20 LAB — IMMUNOFIXATION, URINE

## 2011-01-22 LAB — MULTIPLE MYELOMA PANEL, SERUM
Alpha-2-Globulin: 11.4 % (ref 7.1–11.8)
Beta Globulin: 5.7 % (ref 4.7–7.2)
IgA: 62 mg/dL — ABNORMAL LOW (ref 68–379)
M-Spike, %: NOT DETECTED g/dL
Total Protein: 6.2 g/dL (ref 6.0–8.3)

## 2011-01-23 ENCOUNTER — Telehealth (HOSPITAL_COMMUNITY): Payer: Self-pay

## 2011-01-23 NOTE — Telephone Encounter (Signed)
Error

## 2011-01-23 NOTE — Telephone Encounter (Signed)
Lab results from 01/15/11 faxed to Dr. Hezzie Bump office.

## 2011-01-23 NOTE — Telephone Encounter (Deleted)
All labs

## 2011-01-26 ENCOUNTER — Ambulatory Visit (HOSPITAL_COMMUNITY): Payer: Medicare Other

## 2011-01-26 ENCOUNTER — Encounter (HOSPITAL_COMMUNITY): Payer: Self-pay

## 2011-01-26 ENCOUNTER — Encounter (HOSPITAL_COMMUNITY): Payer: PRIVATE HEALTH INSURANCE | Attending: Oncology

## 2011-01-26 VITALS — BP 148/85 | HR 74 | Temp 98.4°F | Wt 236.0 lb

## 2011-01-26 DIAGNOSIS — C9000 Multiple myeloma not having achieved remission: Secondary | ICD-10-CM

## 2011-01-26 HISTORY — DX: Multiple myeloma not having achieved remission: C90.00

## 2011-01-26 NOTE — Patient Instructions (Signed)
[facility]  Discharge Instructions  RECOMMENDATIONS MADE BY THE CONSULTANT AND ANY TEST RESULTS WILL BE SENT TO YOUR REFERRING DOCTOR.   EXAM FINDINGS BY MD TODAY AND SIGNS AND SYMPTOMS TO REPORT TO CLINIC OR PRIMARY MD:   To return for labs here at [facility] on March 23, 2011 @ 11:30  To return to Dr. Greggory Stallion at [facility] on February 09, 2011 @ 12:30  To return to Dr. Mariel Sleet April 03, 2011 @ 2:00   I acknowledge that I have been informed and understand all the instructions given to me and received a copy. I do not have any more questions at this time, but understand that I may call the Specialty Clinic at [facility] at [phone] during business hours should I have any further questions or need assistance in obtaining follow-up care.    __________________________________________  _____________  __________ Signature of Patient or Authorized Representative            Date                   Time    __________________________________________ Nurse's Signature

## 2011-01-26 NOTE — Progress Notes (Signed)
This office note has been dictated.

## 2011-01-27 NOTE — Progress Notes (Signed)
CC:   [name]. [name], M.D. [name], M.D. [facility]  IDENTIFYING STATEMENT:  The patient is a 65 year old man with multiple myeloma who presents for follow up and to discuss results of recent lab work.  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  The patient tells me that he had consulted with Dr. Trey Sailors and Dr. Channing Mutters plans surgery in the not too distant future.  Otherwise, he has no current complaints except for ongoing weight loss.  He is not able to exercise as much due to neck pain.  He denies any other areas of pain.  He is eating well.  He is not constipated.  He denies fever, chills or night sweats.  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  Results obtained 01/15/2011 notes the following: White cell count 6.7, hemoglobin 13, hematocrit 37.5, platelets 91 (88). BUN and creatinine 32 and 2.82 respectively; calcium 9; kappa free light chain 70.10 (39.20), kappa lambda ratio 60.15 (41.26); IgG 740, IgA decreased at 62, IgM 126.  Creatinine clearance 45.  M spike was not detected, but IFE continues to demonstrate a monoclonal free light chain proteins.  MEDICATIONS:  Fish oil, Lasix 40 mg daily, lisinopril 10 mg daily, Kayexalate weekly, vicodin.  ALLERGIES:  None.  REVIEW OF SYSTEMS:  Besides neck pain, rest of review of systems negative.  PHYSICAL EXAMINATION:  General:  The patient is a well-appearing, well- nourished man in no distress.  Vitals:  Pulse 74, blood pressure 148/85, temperature 98.4, respirations 20.  HEENT:  Head is atraumatic, normocephalic.  Sclerae are anicteric.  Mouth moist, no thrush.  Neck: Supple.  No adenopathy.   Chest:  Clear to percussion and auscultation. CVS:  Unremarkable.  Abdomen:  Soft, nontender.  Bowel sounds present. Extremities:  No edema.  LAB DATA:  As above.  In addition, sodium 138, potassium 4.5, chloride 105, CO2 of 26, glucose 132, T bili 0.4, AST 19, ALT 20.  IMPRESSION AND PLAN:  [name] is a 65 year old gentleman who is status post autologous peripheral blood stem cell transplant on 10/16/2005.  He not on active therapy.  He is not receiving bisphosphonate therapy.  He has chronic kidney disease.  He also has had hypertension.  We are monitoring his myeloma markers and his kappa free light chain and kappa lambda ratio have increased.  We have faxed the results to Dr. Greggory [name] for his review.  [name] sees Dr. Greggory [name] once a year, but he needs to be seen a lot sooner and we will assist him in making the followup appointment.  Otherwise, [name] has been scheduled to follow up in two months time with labs.     ______________________________ Laurice Record, M.D. LIO/MEDQ  D:  01/26/2011  T:  01/26/2011  Job:  [id number]

## 2011-02-05 LAB — CBC
MCV: 102.2 — ABNORMAL HIGH
Platelets: 87 — ABNORMAL LOW
RBC: 3.15 — ABNORMAL LOW
WBC: 5.4

## 2011-02-05 LAB — DIFFERENTIAL
Lymphocytes Relative: 32
Lymphs Abs: 1.7
Monocytes Relative: 8
Neutro Abs: 3.1
Neutrophils Relative %: 58

## 2011-02-05 LAB — BASIC METABOLIC PANEL
BUN: 36 — ABNORMAL HIGH
Calcium: 9.4
Chloride: 106
Creatinine, Ser: 2.28 — ABNORMAL HIGH
GFR calc Af Amer: 36 — ABNORMAL LOW
GFR calc non Af Amer: 30 — ABNORMAL LOW

## 2011-02-06 LAB — BASIC METABOLIC PANEL
BUN: 44 — ABNORMAL HIGH
Calcium: 9.4
Creatinine, Ser: 2.44 — ABNORMAL HIGH
GFR calc non Af Amer: 27 — ABNORMAL LOW
Glucose, Bld: 99
Potassium: 4.5

## 2011-02-06 LAB — DIFFERENTIAL
Basophils Absolute: 0
Eosinophils Relative: 1
Lymphocytes Relative: 32
Neutrophils Relative %: 59

## 2011-02-06 LAB — CBC
HCT: 33 — ABNORMAL LOW
Platelets: 84 — ABNORMAL LOW
RDW: 13.4
WBC: 5.4

## 2011-02-09 LAB — DIFFERENTIAL
Basophils Absolute: 0
Eosinophils Relative: 3
Lymphocytes Relative: 36
Monocytes Relative: 9
WBC Morphology: INCREASED

## 2011-02-09 LAB — BASIC METABOLIC PANEL
Calcium: 9.4
Chloride: 105
Creatinine, Ser: 2.34 — ABNORMAL HIGH
GFR calc Af Amer: 35 — ABNORMAL LOW
Sodium: 136

## 2011-02-09 LAB — CBC
Hemoglobin: 11.4 — ABNORMAL LOW
MCV: 100.4 — ABNORMAL HIGH
RBC: 3.22 — ABNORMAL LOW
WBC: 4

## 2011-02-10 LAB — DIFFERENTIAL
Basophils Absolute: 0
Basophils Relative: 0
Eosinophils Relative: 2
Lymphocytes Relative: 32
Monocytes Relative: 10
Neutro Abs: 2.2

## 2011-02-10 LAB — BASIC METABOLIC PANEL
GFR calc Af Amer: 33 — ABNORMAL LOW
GFR calc non Af Amer: 28 — ABNORMAL LOW
Potassium: 4.5
Sodium: 139

## 2011-02-10 LAB — CBC
HCT: 29.5 — ABNORMAL LOW
Hemoglobin: 10.5 — ABNORMAL LOW
Platelets: 72 — ABNORMAL LOW
RBC: 2.87 — ABNORMAL LOW
WBC: 4

## 2011-02-11 LAB — CBC
HCT: 27.5 — ABNORMAL LOW
HCT: 30.4 — ABNORMAL LOW
Hemoglobin: 10.8 — ABNORMAL LOW
Hemoglobin: 9.2 — ABNORMAL LOW
Platelets: 71 — ABNORMAL LOW
Platelets: 79 — ABNORMAL LOW
RBC: 2.62 — ABNORMAL LOW
RDW: 14.2
RDW: 14.3
RDW: 14.6
WBC: 2.8 — ABNORMAL LOW
WBC: 5.1

## 2011-02-11 LAB — DIFFERENTIAL
Basophils Absolute: 0
Eosinophils Absolute: 0.1
Eosinophils Relative: 1
Eosinophils Relative: 2
Lymphocytes Relative: 24
Lymphocytes Relative: 29
Lymphs Abs: 0.8
Lymphs Abs: 1.2
Monocytes Relative: 10
Neutro Abs: 3.3
Neutrophils Relative %: 58
Neutrophils Relative %: 65

## 2011-02-11 LAB — URINALYSIS, ROUTINE W REFLEX MICROSCOPIC
Bilirubin Urine: NEGATIVE
Specific Gravity, Urine: 1.015
Urobilinogen, UA: 0.2
pH: 7.5

## 2011-02-11 LAB — CARDIAC PANEL(CRET KIN+CKTOT+MB+TROPI)
CK, MB: 7.1 — ABNORMAL HIGH
CK, MB: 9.5 — ABNORMAL HIGH
Relative Index: 3.5 — ABNORMAL HIGH
Relative Index: 3.6 — ABNORMAL HIGH
Total CK: 201
Total CK: 266 — ABNORMAL HIGH
Troponin I: 0.04
Troponin I: 0.05

## 2011-02-11 LAB — TROPONIN I: Troponin I: <0.01

## 2011-02-11 LAB — BASIC METABOLIC PANEL
BUN: 38 — ABNORMAL HIGH
Calcium: 7.8 — ABNORMAL LOW
Calcium: 8.3 — ABNORMAL LOW
GFR calc non Af Amer: 28 — ABNORMAL LOW
GFR calc non Af Amer: 29 — ABNORMAL LOW
GFR calc non Af Amer: 33 — ABNORMAL LOW
Glucose, Bld: 89
Glucose, Bld: 97
Potassium: 4.1
Potassium: 4.6
Sodium: 141
Sodium: 141

## 2011-02-11 LAB — URINE MICROSCOPIC-ADD ON

## 2011-02-11 LAB — POCT CARDIAC MARKERS
CKMB, poc: 5.7
Myoglobin, poc: 262
Troponin i, poc: <0.05

## 2011-02-11 LAB — CK TOTAL AND CKMB (NOT AT ARMC)
CK, MB: 11.9 — ABNORMAL HIGH
Relative Index: 3.4 — ABNORMAL HIGH
Total CK: 350 — ABNORMAL HIGH

## 2011-02-11 LAB — COMPREHENSIVE METABOLIC PANEL
ALT: 21
AST: 28
Calcium: 9.1
GFR calc Af Amer: 30 — ABNORMAL LOW
Sodium: 139
Total Protein: 5.7 — ABNORMAL LOW

## 2011-02-12 LAB — DIFFERENTIAL
Basophils Absolute: 0
Eosinophils Absolute: 0.1
Eosinophils Relative: 1
Eosinophils Relative: 2
Lymphocytes Relative: 37
Lymphocytes Relative: 36
Lymphs Abs: 1.5
Monocytes Absolute: 0.3
Monocytes Absolute: 0.3
Monocytes Relative: 9

## 2011-02-12 LAB — VITAMIN B12: Vitamin B-12: 432 (ref 211–911)

## 2011-02-12 LAB — LIPID PANEL
Cholesterol: 176
LDL Cholesterol: 130 — ABNORMAL HIGH
Triglycerides: 82

## 2011-02-12 LAB — CBC
HCT: 29.8 — ABNORMAL LOW
Hemoglobin: 10.3 — ABNORMAL LOW
MCV: 103.3 — ABNORMAL HIGH
Platelets: 75 — ABNORMAL LOW
RBC: 3.2 — ABNORMAL LOW
WBC: 3.9 — ABNORMAL LOW
WBC: 3.4 — ABNORMAL LOW

## 2011-02-12 LAB — BASIC METABOLIC PANEL
GFR calc non Af Amer: 27 — ABNORMAL LOW
Glucose, Bld: 98
Potassium: 4.6
Sodium: 142

## 2011-02-12 LAB — COMPREHENSIVE METABOLIC PANEL
ALT: 21
AST: 20
Albumin: 4
CO2: 25
Chloride: 110
Creatinine, Ser: 2.28 — ABNORMAL HIGH
GFR calc Af Amer: 36 — ABNORMAL LOW
Potassium: 4.7
Sodium: 142
Total Bilirubin: 0.6

## 2011-02-12 LAB — IRON AND TIBC: UIBC: 208

## 2011-02-12 LAB — FERRITIN: Ferritin: 358 — ABNORMAL HIGH (ref 22–322)

## 2011-02-16 LAB — DIFFERENTIAL
Basophils Absolute: 0
Basophils Relative: 0
Basophils Relative: 0
Eosinophils Absolute: 0.1
Eosinophils Relative: 2
Monocytes Relative: 9
Neutro Abs: 2.3
Neutrophils Relative %: 52

## 2011-02-16 LAB — CBC
HCT: 35 — ABNORMAL LOW
MCHC: 34.3
MCHC: 34.4
MCV: 100.9 — ABNORMAL HIGH
Platelets: 71 — ABNORMAL LOW
RBC: 3.41 — ABNORMAL LOW
RDW: 14.2
WBC: 4.4

## 2011-02-16 LAB — BASIC METABOLIC PANEL
BUN: 39 — ABNORMAL HIGH
CO2: 27
Calcium: 9.6
Chloride: 107
Creatinine, Ser: 2.21 — ABNORMAL HIGH
GFR calc Af Amer: 37 — ABNORMAL LOW
Glucose, Bld: 96
Potassium: 5.1

## 2011-02-17 LAB — DIFFERENTIAL
Eosinophils Relative: 3
Lymphocytes Relative: 30
Lymphs Abs: 1
Monocytes Absolute: 0.4
Monocytes Relative: 10

## 2011-02-17 LAB — CBC
HCT: 35.4 — ABNORMAL LOW
Hemoglobin: 11.9 — ABNORMAL LOW
RBC: 3.49 — ABNORMAL LOW
WBC: 3.5 — ABNORMAL LOW

## 2011-02-17 LAB — BASIC METABOLIC PANEL
GFR calc non Af Amer: 30 — ABNORMAL LOW
Glucose, Bld: 103 — ABNORMAL HIGH
Potassium: 4.5
Sodium: 144

## 2011-02-20 LAB — CBC
MCHC: 34.1 g/dL (ref 30.0–36.0)
MCV: 101.7 fL — ABNORMAL HIGH (ref 78.0–100.0)
Platelets: 71 10*3/uL — ABNORMAL LOW (ref 150–400)

## 2011-02-20 LAB — DIFFERENTIAL
Basophils Absolute: 0 10*3/uL (ref 0.0–0.1)
Basophils Relative: 0 % (ref 0–1)
Eosinophils Absolute: 0.1 10*3/uL (ref 0.0–0.7)
Lymphs Abs: 1.8 10*3/uL (ref 0.7–4.0)
Monocytes Relative: 7 % (ref 3–12)
Neutro Abs: 3.5 10*3/uL (ref 1.7–7.7)
WBC Morphology: INCREASED

## 2011-02-20 LAB — BASIC METABOLIC PANEL
BUN: 38 mg/dL — ABNORMAL HIGH (ref 6–23)
CO2: 27 mEq/L (ref 19–32)
Chloride: 105 mEq/L (ref 96–112)
Creatinine, Ser: 2.13 mg/dL — ABNORMAL HIGH (ref 0.4–1.5)

## 2011-02-23 LAB — CBC
HCT: 31.4 — ABNORMAL LOW
Hemoglobin: 10.8 — ABNORMAL LOW
RDW: 13.3

## 2011-02-23 LAB — BASIC METABOLIC PANEL
CO2: 27
GFR calc non Af Amer: 29 — ABNORMAL LOW
Glucose, Bld: 101 — ABNORMAL HIGH
Potassium: 5.6 — ABNORMAL HIGH
Sodium: 140

## 2011-02-23 LAB — DIFFERENTIAL
Basophils Absolute: 0
Eosinophils Relative: 1
Lymphocytes Relative: 41
Monocytes Absolute: 0.4

## 2011-02-24 LAB — DIFFERENTIAL
Basophils Absolute: 0
Basophils Relative: 0
Eosinophils Absolute: 0
Neutrophils Relative %: 48

## 2011-02-24 LAB — CBC
MCHC: 34.5
MCV: 103.9 — ABNORMAL HIGH
Platelets: 77 — ABNORMAL LOW
RDW: 14.3 — ABNORMAL HIGH
WBC: 4.2

## 2011-02-24 LAB — BASIC METABOLIC PANEL
BUN: 41 — ABNORMAL HIGH
CO2: 24
Chloride: 107
Creatinine, Ser: 2.25 — ABNORMAL HIGH
Glucose, Bld: 92

## 2011-02-26 LAB — DIFFERENTIAL
Basophils Absolute: 0
Lymphocytes Relative: 41
Monocytes Absolute: 0.4
Monocytes Relative: 8
Neutro Abs: 2.7
Neutrophils Relative %: 49

## 2011-02-26 LAB — BASIC METABOLIC PANEL
Calcium: 8.9
Creatinine, Ser: 2.29 — ABNORMAL HIGH
GFR calc Af Amer: 36 — ABNORMAL LOW
GFR calc non Af Amer: 29 — ABNORMAL LOW
Sodium: 134 — ABNORMAL LOW

## 2011-02-26 LAB — CBC
Hemoglobin: 10.4 — ABNORMAL LOW
RBC: 3.03 — ABNORMAL LOW

## 2011-02-27 LAB — BASIC METABOLIC PANEL
BUN: 38 — ABNORMAL HIGH
Chloride: 105
GFR calc non Af Amer: 27 — ABNORMAL LOW
Glucose, Bld: 95
Potassium: 4.4
Sodium: 138

## 2011-02-27 LAB — CBC
HCT: 30.2 — ABNORMAL LOW
Hemoglobin: 10.4 — ABNORMAL LOW
MCV: 99.5
Platelets: 66 — ABNORMAL LOW
WBC: 3.6 — ABNORMAL LOW

## 2011-03-02 ENCOUNTER — Other Ambulatory Visit (HOSPITAL_COMMUNITY): Payer: Self-pay | Admitting: Oncology

## 2011-03-03 ENCOUNTER — Other Ambulatory Visit (HOSPITAL_COMMUNITY): Payer: Self-pay | Admitting: Oncology

## 2011-03-03 DIAGNOSIS — D649 Anemia, unspecified: Secondary | ICD-10-CM

## 2011-03-03 DIAGNOSIS — Z79899 Other long term (current) drug therapy: Secondary | ICD-10-CM

## 2011-03-03 DIAGNOSIS — R809 Proteinuria, unspecified: Secondary | ICD-10-CM

## 2011-03-03 DIAGNOSIS — N189 Chronic kidney disease, unspecified: Secondary | ICD-10-CM

## 2011-03-03 LAB — BASIC METABOLIC PANEL
BUN: 47 — ABNORMAL HIGH
CO2: 26
Chloride: 108
Creatinine, Ser: 2.57 — ABNORMAL HIGH
Glucose, Bld: 125 — ABNORMAL HIGH
Potassium: 4.4

## 2011-03-03 LAB — CBC
HCT: 35.8 — ABNORMAL LOW
MCHC: 34.3
MCV: 103.1 — ABNORMAL HIGH
Platelets: 63 — ABNORMAL LOW
RDW: 15.4 — ABNORMAL HIGH
WBC: 3.3 — ABNORMAL LOW

## 2011-03-05 LAB — BASIC METABOLIC PANEL
BUN: 44 — ABNORMAL HIGH
Chloride: 108
GFR calc Af Amer: 34 — ABNORMAL LOW
GFR calc non Af Amer: 28 — ABNORMAL LOW
Potassium: 4.5
Sodium: 137

## 2011-03-05 LAB — CBC
HCT: 28.2 — ABNORMAL LOW
Platelets: 60 — ABNORMAL LOW
RDW: 18 — ABNORMAL HIGH
WBC: 2.9 — ABNORMAL LOW

## 2011-03-23 ENCOUNTER — Other Ambulatory Visit (HOSPITAL_COMMUNITY): Payer: Medicare Other

## 2011-03-24 ENCOUNTER — Other Ambulatory Visit (HOSPITAL_COMMUNITY): Payer: Medicare Other

## 2011-03-25 ENCOUNTER — Encounter (HOSPITAL_COMMUNITY): Payer: Medicare Other | Attending: Oncology

## 2011-03-25 DIAGNOSIS — C9000 Multiple myeloma not having achieved remission: Secondary | ICD-10-CM | POA: Insufficient documentation

## 2011-03-25 DIAGNOSIS — R809 Proteinuria, unspecified: Secondary | ICD-10-CM

## 2011-03-25 DIAGNOSIS — N189 Chronic kidney disease, unspecified: Secondary | ICD-10-CM

## 2011-03-25 DIAGNOSIS — Z79899 Other long term (current) drug therapy: Secondary | ICD-10-CM

## 2011-03-25 DIAGNOSIS — D649 Anemia, unspecified: Secondary | ICD-10-CM

## 2011-03-25 LAB — DIFFERENTIAL
Basophils Absolute: 0 10*3/uL (ref 0.0–0.1)
Basophils Relative: 0 % (ref 0–1)
Eosinophils Absolute: 0.1 10*3/uL (ref 0.0–0.7)
Monocytes Absolute: 0.5 10*3/uL (ref 0.1–1.0)
Monocytes Relative: 7 % (ref 3–12)
Neutrophils Relative %: 59 % (ref 43–77)

## 2011-03-25 LAB — COMPREHENSIVE METABOLIC PANEL
AST: 32 U/L (ref 0–37)
Albumin: 3.6 g/dL (ref 3.5–5.2)
Alkaline Phosphatase: 60 U/L (ref 39–117)
BUN: 28 mg/dL — ABNORMAL HIGH (ref 6–23)
CO2: 28 mEq/L (ref 19–32)
Chloride: 103 mEq/L (ref 96–112)
GFR calc non Af Amer: 28 mL/min — ABNORMAL LOW (ref >90)
Potassium: 5 mEq/L (ref 3.5–5.1)
Total Bilirubin: 0.4 mg/dL (ref 0.3–1.2)

## 2011-03-25 LAB — PHOSPHORUS: Phosphorus: 2.8 mg/dL (ref 2.3–4.6)

## 2011-03-25 LAB — CBC
HCT: 39.2 % (ref 39.0–52.0)
MCV: 101.6 fL — ABNORMAL HIGH (ref 78.0–100.0)
RBC: 3.86 MIL/uL — ABNORMAL LOW (ref 4.22–5.81)
RDW: 13.9 % (ref 11.5–15.5)
WBC: 6.2 10*3/uL (ref 4.0–10.5)

## 2011-03-25 LAB — PROTEIN / CREATININE RATIO, URINE
Creatinine, Urine: 56.89 mg/dL
Protein Creatinine Ratio: 1.44 — ABNORMAL HIGH (ref 0.00–0.15)
Total Protein, Urine: 82 mg/dL

## 2011-03-25 NOTE — Progress Notes (Signed)
Labs drawn today for cbc/diff,cmp,mm panel,kllc, 24 hour urine protein/creatine ratio,renal

## 2011-03-27 LAB — MULTIPLE MYELOMA PANEL, SERUM
Alpha-1-Globulin: 5.4 % — ABNORMAL HIGH (ref 2.9–4.9)
Alpha-2-Globulin: 12 % — ABNORMAL HIGH (ref 7.1–11.8)
Beta 2: 3.8 % (ref 3.2–6.5)
Gamma Globulin: 8.2 % — ABNORMAL LOW (ref 11.1–18.8)

## 2011-04-03 ENCOUNTER — Encounter (HOSPITAL_COMMUNITY): Payer: Self-pay | Admitting: Oncology

## 2011-04-03 ENCOUNTER — Encounter (HOSPITAL_BASED_OUTPATIENT_CLINIC_OR_DEPARTMENT_OTHER): Payer: Medicare Other | Admitting: Oncology

## 2011-04-03 DIAGNOSIS — C9000 Multiple myeloma not having achieved remission: Secondary | ICD-10-CM

## 2011-04-03 NOTE — Patient Instructions (Addendum)
REUEL LAMADRID  [id number] Nov 22, 1945   [facility]  Discharge Instructions  RECOMMENDATIONS MADE BY THE CONSULTANT AND ANY TEST RESULTS WILL BE SENT TO YOUR REFERRING DOCTOR.   EXAM FINDINGS BY MD TODAY AND SIGNS AND SYMPTOMS TO REPORT TO CLINIC OR PRIMARY MD: Exam and findings good per Dr. Mariel Sleet.  INSTRUCTIONS GIVEN AND DISCUSSED: See your attached schedule for lab appointment and your appointment with Dr. Mariel Sleet.  You have also been given a container for a 24 hour urine collection specimen.    I acknowledge that I have been informed and understand all the instructions given to me and received a copy. I do not have any more questions at this time, but understand that I may call the Specialty Clinic at [facility] at [phone] during business hours should I have any further questions or need assistance in obtaining follow-up care.    __________________________________________  _____________  __________ Signature of Patient or Authorized Representative            Date                   Time    __________________________________________ Nurse's Signature

## 2011-04-03 NOTE — Progress Notes (Signed)
This office note has been dictated.

## 2011-04-03 NOTE — Progress Notes (Signed)
CC:   [name], M.D. Jaclyn Prime. Greggory Stallion, M.D. Jorja Loa, M.D. Payton Doughty, M.D.  DIAGNOSES: 1. Kappa light chain multiple myeloma presenting here in 12/2004 with     massive proteinuria, renal failure, hypercalcemia, bone involvement     and he underwent chemotherapy followed by bone marrow transplant in     09/2005 at [facility].  He has been in a very good remission     since though he does have some kappa light chain elevations     intermittently. 2. Neck trauma in the past status post fusion C4 through T1 and he     just recently had surgery by Dr. Trey Sailors 3 weeks ago.  I believe     he had anterior decompression and fusion of C3-C4.  He is already     feeling better he states. 3. Diagnosed history of hypertension. 4. Kayexalate usage for mild hyperkalemia from renal insufficiency. 5. Renal insufficiency which is stable. 6. Herpes zoster left shoulder in the past which has resolved.  When Maddux was over seeing Dr. Greggory Stallion in July his kappa light chains were 739 mg/liter, our most recent value is 155.  His kappa to lambda ratio is high however which is no surprise.  His most recent creatinine is 2.32.  I did not check a uric acid but Dr. Greggory Stallion did put him on allopurinol again 100 mg a day.  We will check that the next time.  That will be in 8 weeks.  His urine protein total value was 924 mg per 24 hours.  Creatinine clearance was actually 45 mL/minute and that was an excellent collection of 2100 mL.  His IgG level is 509, IgA level 44.  No M spike was detected on his urine protein electrophoresis or on the serum protein electrophoresis.  His IgM level was 91 by the way.  He has a very stable vital signs.  He feels very good.  His review of systems oncologically is negative.  So what we are going to do is just check his labs again in 8 weeks.  I am going to speak with Dr. Greggory Stallion.  I think we are all concerned of course that he may fail some day but right now  has only minimal elevations of kappa light chains and they are intermittent but they are higher than they were 1 year ago.  So we will keep an eye on him every 8 weeks.  I will see him after his labs in 8 weeks.    ______________________________ Ladona Horns. Mariel Sleet, MD ESN/MEDQ  D:  04/03/2011  T:  04/03/2011  Job:  [id number]

## 2011-04-21 ENCOUNTER — Ambulatory Visit (HOSPITAL_COMMUNITY): Payer: Medicare Other | Admitting: Oncology

## 2011-05-25 ENCOUNTER — Encounter (HOSPITAL_COMMUNITY): Payer: Medicare Other | Attending: Oncology

## 2011-05-25 DIAGNOSIS — C9000 Multiple myeloma not having achieved remission: Secondary | ICD-10-CM | POA: Insufficient documentation

## 2011-05-25 LAB — COMPREHENSIVE METABOLIC PANEL
Albumin: 3.6 g/dL (ref 3.5–5.2)
Alkaline Phosphatase: 55 U/L (ref 39–117)
BUN: 36 mg/dL — ABNORMAL HIGH (ref 6–23)
Potassium: 4.5 mEq/L (ref 3.5–5.1)
Sodium: 139 mEq/L (ref 135–145)
Total Protein: 6.4 g/dL (ref 6.0–8.3)

## 2011-05-25 LAB — DIFFERENTIAL
Eosinophils Absolute: 0.1 10*3/uL (ref 0.0–0.7)
Lymphs Abs: 1.9 10*3/uL (ref 0.7–4.0)
Monocytes Relative: 9 % (ref 3–12)
Neutrophils Relative %: 51 % (ref 43–77)

## 2011-05-25 LAB — CBC
MCHC: 34.6 g/dL (ref 30.0–36.0)
Platelets: 107 10*3/uL — ABNORMAL LOW (ref 150–400)
RDW: 13.5 % (ref 11.5–15.5)

## 2011-05-25 LAB — CREATININE CLEARANCE, URINE, 24 HOUR
Collection Interval-CRCL: 24 hours
Creatinine, Urine: 54.32 mg/dL
Creatinine: 2.42 mg/dL — ABNORMAL HIGH (ref 0.50–1.35)
Urine Total Volume-CRCL: 2600 mL

## 2011-05-25 LAB — URIC ACID: Uric Acid, Serum: 7.4 mg/dL (ref 4.0–7.8)

## 2011-05-25 NOTE — Progress Notes (Signed)
Labs drawn today for cbc/diff,cmp,immunofixation, Kllc, uric acid,  And 24 hour urine for protein electorois

## 2011-05-26 LAB — KAPPA/LAMBDA LIGHT CHAINS: Kappa free light chain: 249 mg/dL — ABNORMAL HIGH (ref 0.33–1.94)

## 2011-05-27 LAB — IMMUNOFIXATION ELECTROPHORESIS
IgA: 39 mg/dL — ABNORMAL LOW (ref 68–379)
IgG (Immunoglobin G), Serum: 460 mg/dL — ABNORMAL LOW (ref 650–1600)

## 2011-05-27 LAB — UIFE/LIGHT CHAINS/TP QN, 24-HR UR
Albumin, U: DETECTED
Alpha 1, Urine: DETECTED — AB
Alpha 2, Urine: DETECTED — AB
Beta, Urine: DETECTED — AB
Total Protein, Urine-Ur/day: 2751 mg/d — ABNORMAL HIGH (ref 10–140)

## 2011-05-29 ENCOUNTER — Encounter (HOSPITAL_BASED_OUTPATIENT_CLINIC_OR_DEPARTMENT_OTHER): Payer: Medicare Other | Admitting: Oncology

## 2011-05-29 DIAGNOSIS — N19 Unspecified kidney failure: Secondary | ICD-10-CM

## 2011-05-29 DIAGNOSIS — R7989 Other specified abnormal findings of blood chemistry: Secondary | ICD-10-CM

## 2011-05-29 DIAGNOSIS — Z23 Encounter for immunization: Secondary | ICD-10-CM

## 2011-05-29 DIAGNOSIS — C9000 Multiple myeloma not having achieved remission: Secondary | ICD-10-CM

## 2011-05-29 MED ORDER — PNEUMOCOCCAL VAC POLYVALENT 25 MCG/0.5ML IJ INJ
0.5000 mL | INJECTION | INTRAMUSCULAR | Status: AC
  Administered 2011-05-29: 0.5 mL via INTRAMUSCULAR
  Filled 2011-05-29: qty 0.5

## 2011-05-29 NOTE — Progress Notes (Signed)
This office note has been dictated.

## 2011-05-29 NOTE — Progress Notes (Signed)
[name] presents today for injection per MD orders. pneumovac 0.5 cc administered IM in left Upper Arm. Administration without incident. Patient tolerated well.

## 2011-05-29 NOTE — Patient Instructions (Signed)
[facility]  Discharge Instructions  RECOMMENDATIONS MADE BY THE CONSULTANT AND ANY TEST RESULTS WILL BE SENT TO YOUR REFERRING DOCTOR.   EXAM FINDINGS BY MD TODAY AND SIGNS AND SYMPTOMS TO REPORT TO CLINIC OR PRIMARY MD: We will do bone marrow in the next week or two.  INSTRUCTIONS GIVEN AND DISCUSSED: Other  Pneumovax today      I acknowledge that I have been informed and understand all the instructions given to me and received a copy. I do not have any more questions at this time, but understand that I may call the Specialty Clinic at [facility] at [phone] during business hours should I have any further questions or need assistance in obtaining follow-up care.    __________________________________________  _____________  __________ Signature of Patient or Authorized Representative            Date                   Time    __________________________________________ Nurse's Signature

## 2011-05-30 NOTE — Progress Notes (Signed)
CC:   [name], M.D. [name]. [name], M.D. [name], M.D. [name], M.D.  DIAGNOSES: 1. Kappa light chain multiple myeloma, presenting in August 2006 with     greater than 25 g of proteinuria. 2. He also had renal failure, hypercalcemia, and extensive bone marrow     involvement, status post chemotherapy followed by bone marrow     transplant in May 2007. 3. Renal sufficiency with temporary dialysis at the time of     presentation. 4. Hyperuricemia, and his uric acid has gone from 10.6 to less than 8     presently. It is in the normal range presently.  HISTORY:  Isiac's urine protein is now greater than 2700 mg, and he has over 1700 mg of kappa light chains.  His serum kappa light chains are also increasing. They have gone from 155 mg/dL in November to 147 mg/dL on May 25, 2011.  His creatinine is about the same at 2.42.  His calcium is still normal. Total protein is only 6.4-6.3.  His IgG is only 460 mg, IgA is 39 mg, and IgM is 68 mg, and there is no heavy chain spike.  ASSESSMENT AND PLAN:  I think we therefore need to re-evaluate him with a bone marrow aspirate and biopsy, and if he shows an increase in plasma cells, we will get him back to Dr. Greggory [name] for just another consultation. But, it would be my opinion that he probably needs to reinitiate chemotherapy.  But, we will wait and see.    ______________________________ Ladona Horns. Mariel Sleet, MD ESN/MEDQ  D:  05/29/2011  T:  05/30/2011  Job:  [id number]

## 2011-06-02 ENCOUNTER — Other Ambulatory Visit (HOSPITAL_COMMUNITY): Payer: Self-pay | Admitting: Oncology

## 2011-06-02 ENCOUNTER — Encounter (HOSPITAL_BASED_OUTPATIENT_CLINIC_OR_DEPARTMENT_OTHER): Payer: Medicare Other | Admitting: Oncology

## 2011-06-02 DIAGNOSIS — C9000 Multiple myeloma not having achieved remission: Secondary | ICD-10-CM

## 2011-06-02 LAB — DIFFERENTIAL
Lymphocytes Relative: 37 % (ref 12–46)
Lymphs Abs: 2.4 10*3/uL (ref 0.7–4.0)
Neutrophils Relative %: 54 % (ref 43–77)

## 2011-06-02 LAB — CBC
Platelets: 90 10*3/uL — ABNORMAL LOW (ref 150–400)
RBC: 3.81 MIL/uL — ABNORMAL LOW (ref 4.22–5.81)
WBC: 6.4 10*3/uL (ref 4.0–10.5)

## 2011-06-02 MED ORDER — LIDOCAINE HCL (PF) 2 % IJ SOLN
INTRAMUSCULAR | Status: AC
  Filled 2011-06-02: qty 4

## 2011-06-02 NOTE — Progress Notes (Deleted)
[facility] BONE MARROW BIOPSY/ASPIRATE PROGRESS NOTE  [name] presents for Bone Marrow biopsy per MD orders. [name] verbalized understanding of procedure. Consent reviewed and signed.  [name] positioned supine for procedure. Time-out performed and Bone Marrow Checklist. Procedure began at ***. Xylocaine 2% 10 cc used for local and administered to patient by ***. Procedure completed at ***. Patient tolerated well. Pressure dressing applied to {hip; r/l/both:19462} with instructions to leave in place for 24 hours. Patient instructed to report any bleeding that saturates dressing and to take pain medication *** as directed. Dressing dry and intact to {hip; r/l/both:19462} on discharge.

## 2011-06-02 NOTE — Progress Notes (Signed)
This office note has been dictated.

## 2011-06-02 NOTE — Patient Instructions (Signed)
[facility] Discharge Instructions for Post Bone Marrow Procedure  Today you had a bone marrow biopsy and aspirate of the right hip   Please keep the pressure dressing in place for at least 24 hours.  Have someone check your dressing periodically for bleeding.  If needed you can reapply a pressure dressing to the site.  Take pain medication as directed.  IF BLEEDING REOCCURS THAT SHOULD BE REPORTED IMMEDIATELY. Call the Cancer Center at [phone] if during business hours. Or report to the Emergency Room.   I have been informed and understand all the instructions given to me. I know to contact the clinic, my physician, or go to the Emergency Department if any problems should occur. I do not have any questions at this time, but understand that I may call the clinic during office hours at [phone]  should I have any questions or need assistance in obtaining follow up care.    __________________________________________  _____________  __________ Signature of Patient or Authorized Representative            Date                   Time    __________________________________________ Nurse's Signature

## 2011-06-02 NOTE — Progress Notes (Signed)
PROCEDURE:  Bone marrow aspirate and biopsy.  After informed consent, the gentleman was placed in the prone position and the posterior superior iliac spinous processes were identified.  The right was chosen.  He had Betadine swabs x3 after shaving his hair, which was excessively long just in that quadrant.  He was then anesthetized with 9.5 mL of 2% plain Xylocaine and a bone marrow aspirate and biopsy were obtained without difficulty, though the biopsy specimens were slightly small x2.  The material was sent for routine H and E as well as cytogenetics.  This is to rule out whether or not he has recurrent myeloma.  He was sent home in good condition without complications.    ______________________________ Ladona Horns. Mariel Sleet, MD ESN/MEDQ  D:  06/02/2011  T:  06/02/2011  Job:  [id number]

## 2011-06-02 NOTE — Progress Notes (Signed)
[facility] BONE MARROW BIOPSY/ASPIRATE PROGRESS NOTE  [name] presents for Bone Marrow biopsy per MD orders. [name] verbalized understanding of procedure. Consent reviewed and signed.  [name] positioned supine for procedure. Time-out performed and Bone Marrow Checklist. Procedure began at 0845am. Xylocaine 2% 10 cc used for local and administered to patient by Dr.Eric Neijstrom. Procedure completed at 0900am. Patient tolerated well. Pressure dressing applied to the right hip with instructions to leave in place for 24 hours. Patient instructed to report any bleeding that saturates dressing and to take pain medication as directed. Dressing dry and intact to the right hip on discharge.

## 2011-06-16 ENCOUNTER — Ambulatory Visit (HOSPITAL_COMMUNITY): Payer: Medicare Other | Admitting: Oncology

## 2011-06-16 ENCOUNTER — Telehealth (HOSPITAL_COMMUNITY): Payer: Self-pay

## 2011-06-16 ENCOUNTER — Encounter (HOSPITAL_COMMUNITY): Payer: Self-pay

## 2011-06-16 NOTE — Progress Notes (Signed)
Per Dr. Mariel Sleet, patient will be receiving Velcade subcutaneously instead of IV.  Port placement cancelled with Dr. Daisy Blossom office.  Patient scheduled to start chemotherapy on 06/22/11 and patient requests a late morning appointment.

## 2011-06-16 NOTE — Telephone Encounter (Signed)
Message left for patient that appointment has been made for consult with Dr. Malvin Johns for port-a-cath placement on Friday 2/1 @ 11am.  Call back requested to confirm message was received.

## 2011-06-17 MED ORDER — ACYCLOVIR 400 MG PO TABS: 400.0000 mg | ORAL_TABLET | Freq: Every day | ORAL | Status: DC

## 2011-06-17 MED ORDER — PROCHLORPERAZINE 25 MG RE SUPP: 25.0000 mg | Freq: Four times a day (QID) | RECTAL | Status: DC | PRN

## 2011-06-17 MED ORDER — DEXAMETHASONE 4 MG PO TABS: ORAL_TABLET | ORAL | Status: DC

## 2011-06-17 MED ORDER — LORAZEPAM 1 MG PO TABS: 1.0000 mg | ORAL_TABLET | ORAL | Status: AC | PRN

## 2011-06-17 MED ORDER — ONDANSETRON HCL 8 MG PO TABS: 8.0000 mg | ORAL_TABLET | Freq: Two times a day (BID) | ORAL | Status: DC | PRN

## 2011-06-17 NOTE — Progress Notes (Signed)
Patient instructed to come to [facility] Radiology Department and have Bone Survey tomorrow or Friday. Patient said he would come Friday. Patient instructed to take Dexamethasone 5 tablets in the am before coming for chemo on Feb 4. Patient instructed to take Acyclovir daily starting January 31. Patient verbalized understanding of all instructions.  CHEMO TEACHING AND CONSENT SIGNED FOR VELCADE COMPLETED Jun 22, 2011 BEFORE CHEMO ADMINISTRATION. PT VERBALIZES UNDERSTANDING TO TAKE ACYCLOVIR DAILY AND TO TAKE DEXAMETHASONE BID ON ALL MONDAYS!

## 2011-06-17 NOTE — Progress Notes (Signed)
[name] did not come in today because I had not been able to retrieve his cytogenetics result until today and then I wanted to discuss his case with Dr Greggory Stallion at [facility]. I was able to discuss him with Dr Greggory Stallion and we have decided to treat him with Velcade and Decadron and see how he does after 4 cycles.  If he does well, he may be a candidate for re-transplant, and Dr Greggory Stallion will check to see if he has any bone marrow left over from his transplant several years ago. So, we will start therapy Monday with SQ Velcade and weekly Decadron see how he does and get him back to Dr Greggory Stallion after 4 cycles if all goes well. I talked to [name] over the phone extensively yesterday evening and he is ready to proceed.

## 2011-06-19 ENCOUNTER — Ambulatory Visit (HOSPITAL_COMMUNITY)
Admission: RE | Admit: 2011-06-19 | Discharge: 2011-06-19 | Disposition: A | Discharge status: Home / Self Care | Payer: PRIVATE HEALTH INSURANCE | Source: Ambulatory Visit | Attending: Oncology | Admitting: Oncology

## 2011-06-19 DIAGNOSIS — C9000 Multiple myeloma not having achieved remission: Secondary | ICD-10-CM | POA: Insufficient documentation

## 2011-06-22 ENCOUNTER — Inpatient Hospital Stay (HOSPITAL_COMMUNITY): Payer: Medicare Other

## 2011-06-22 ENCOUNTER — Encounter (HOSPITAL_COMMUNITY): Payer: 59 | Attending: Oncology

## 2011-06-22 DIAGNOSIS — C9000 Multiple myeloma not having achieved remission: Secondary | ICD-10-CM | POA: Insufficient documentation

## 2011-06-22 LAB — COMPREHENSIVE METABOLIC PANEL
ALT: 35 U/L (ref 0–53)
AST: 25 U/L (ref 0–37)
Albumin: 4 g/dL (ref 3.5–5.2)
CO2: 22 mEq/L (ref 19–32)
Calcium: 9.4 mg/dL (ref 8.4–10.5)
Chloride: 105 mEq/L (ref 96–112)
Creatinine, Ser: 2.6 mg/dL — ABNORMAL HIGH (ref 0.50–1.35)
Sodium: 138 mEq/L (ref 135–145)
Total Bilirubin: 0.4 mg/dL (ref 0.3–1.2)

## 2011-06-22 LAB — DIFFERENTIAL
Basophils Absolute: 0 10*3/uL (ref 0.0–0.1)
Basophils Relative: 0 % (ref 0–1)
Lymphocytes Relative: 17 % (ref 12–46)
Monocytes Absolute: 0.1 10*3/uL (ref 0.1–1.0)
Neutro Abs: 4.6 10*3/uL (ref 1.7–7.7)

## 2011-06-22 LAB — CBC
HCT: 40.5 % (ref 39.0–52.0)
MCHC: 33.6 g/dL (ref 30.0–36.0)
Platelets: 98 10*3/uL — ABNORMAL LOW (ref 150–400)
RDW: 13.2 % (ref 11.5–15.5)
WBC: 5.7 10*3/uL (ref 4.0–10.5)

## 2011-06-22 MED ORDER — HEPARIN SOD (PORK) LOCK FLUSH 100 UNIT/ML IV SOLN
500.0000 [IU] | Freq: Once | INTRAVENOUS | Status: DC | PRN
  Filled 2011-06-22: qty 5

## 2011-06-22 MED ORDER — SODIUM CHLORIDE 0.9 % IJ SOLN
10.0000 mL | INTRAMUSCULAR | Status: DC | PRN
  Administered 2011-06-22: 10 mL
  Filled 2011-06-22: qty 10

## 2011-06-22 MED ORDER — ALTEPLASE 2 MG IJ SOLR
2.0000 mg | Freq: Once | INTRAMUSCULAR | Status: DC | PRN
  Filled 2011-06-22: qty 2

## 2011-06-22 MED ORDER — SODIUM CHLORIDE 0.9 % IV SOLN
Freq: Once | INTRAVENOUS | Status: AC
  Administered 2011-06-22: 14:00:00 via INTRAVENOUS

## 2011-06-22 MED ORDER — BORTEZOMIB CHEMO SQ INJECTION 3.5 MG (2.5MG/ML)
1.3000 mg/m2 | Freq: Once | INTRAMUSCULAR | Status: AC
  Administered 2011-06-22: 3 mg via SUBCUTANEOUS
  Filled 2011-06-22 (×2): qty 1.2

## 2011-06-22 MED ORDER — HEPARIN SOD (PORK) LOCK FLUSH 100 UNIT/ML IV SOLN
250.0000 [IU] | Freq: Once | INTRAVENOUS | Status: DC | PRN
  Filled 2011-06-22: qty 5

## 2011-06-22 MED ORDER — ZOLEDRONIC ACID 4 MG/5ML IV CONC
2.0000 mg | Freq: Once | INTRAVENOUS | Status: AC
  Administered 2011-06-22: 2 mg via INTRAVENOUS
  Filled 2011-06-22: qty 2.5

## 2011-06-22 MED ORDER — SODIUM CHLORIDE 0.9 % IJ SOLN
3.0000 mL | Freq: Once | INTRAMUSCULAR | Status: DC | PRN
  Filled 2011-06-22: qty 10

## 2011-06-22 MED ORDER — ONDANSETRON HCL 8 MG PO TABS
8.0000 mg | ORAL_TABLET | Freq: Once | ORAL | Status: AC
  Administered 2011-06-22: 8 mg via ORAL
  Filled 2011-06-22: qty 1

## 2011-06-22 NOTE — Patient Instructions (Addendum)
[facility]   CHEMOTHERAPY INSTRUCTIONS Velcade subcutaneously (chemo) and Zometa (not chemo)  POTENTIAL SIDE EFFECTS OF TREATMENT: Increased Susceptibility to Infection, Nausea/Vomiting, diarrhea,, Changes in Character of Skin and Nails (brittleness, dryness,etc.) and Bone Marrow Suppression   Velcade - You will take this on Day 1, 4, 8, 11 every 21 days here at the Cancer Clinic. This will be an injection in your abdominal tissue. Side Effects: peripheral neuropathy, (numbness, tingling, burning in hands/fingers/feet/toes), hypotension, nausea, vomiting, diarrhea, blurred vision, fatigue, bone marrow suppression (low white blood cells-fight infection, low red blood cells- this makes up your hemoglobin & circulating blood volume, low platelets-this is what helps your blood to clot)   Dexamethasone 4mg  tablet. - You will be responsible for taking 5 tablets in the am and 5 tablets in the pm with food EVERY Monday!!!!! This is a steroid and will be part of your chemo regimen. However, it is not chemo. This tablet can/will increase your blood sugar. It can also make you feel nervous/jittery/irritable/or make you have trouble sleeping.   Zometa - this medication will be given every 28 days through your IV. This medication will be given to protect your bones from bone loss as well as keep your bones from fracturing.    EDUCATIONAL MATERIALS GIVEN AND REVIEWED: Specific Instructions Sheets on Velcade, Dexamethasone, and Zometa   SELF CARE ACTIVITIES WHILE ON CHEMOTHERAPY: Increase your fluid intake 48 hours prior to treatment and drink at least 2 quarts per day after treatment., No alcohol intake., No aspirin or other medications unless approved by your oncologist., Eat foods that are light and easy to digest., Eat foods at cold or room temperature., No fried, fatty, or spicy foods immediately before or after treatment., Have teeth cleaned professionally before  starting treatment. Keep dentures and partial plates clean., Use soft toothbrush and do not use mouthwashes that contain alcohol. Biotene is a good mouthwash that is available at most pharmacies or may be ordered by calling [phone]., Use warm salt water gargles (1 teaspoon salt per 1 quart warm water) before and after meals and at bedtime. Or you may rinse with 2 tablespoons of three -percent hydrogen peroxide mixed in eight ounces of water., Always use sunscreen with SPF (Sun Protection Factor) of 30 or higher., Use your nausea medication as directed to prevent nausea., Use your stool softener or laxative as directed to prevent constipation. and Use your anti-diarrheal medication as directed to stop diarrhea.   Please wash your hands for at least 30 seconds using warm soapy water. Handwashing is the #1 way to prevent the spread of germs. Stay away from sick people or people who are getting over a cold. If you develop respiratory systems such as green/yellow mucus production or productive cough or persistent cough let us know and we will see if you need an antibiotic. It is a good idea to keep a pair of gloves on when going into grocery stores/[employer] to decrease your risk of coming into contact with germs on the carts, etc. Carry alcohol hand gel with you at all times and use it frequently if out in public. All foods need to be cooked thoroughly. No raw foods. No medium or undercooked meats, eggs. If your food is cooked medium well, it does not need to be hot pink or saturated with bloody liquid at all. Vegetables and fruits need to be washed/rinsed under the faucet with a dish detergent before being consumed. You can eat raw fruits  and vegetables unless we tell you otherwise but it would be best if you cooked them or bought frozen. Do not eat off of salad bars or hot bars unless you really trust the cleanliness of the restaurant. If you need dental work, please let Dr. Mariel Sleet know before you go for  your appointment so that we can coordinate the best possible time for you in regards to your chemo regimen. You need to also let your dentist know that you are actively taking chemo. We may need to do labs prior to your dental appointment. We also want your bowels moving at least every other day. If this is not happening, we need to know so that we can get you on a bowel regimen to help you go.    MEDICATIONS:  Dexamethasone 4mg  tablet. Take 5 tablets in the am and 5 tablets in the pm EVERY Monday.  Acyclovir 400mg  tablet. Take 1 tablet daily. This is an antiviral drug.   Zofran 8mg  tablet. May take 1 tablet two times a day if needed for nausea/vomiting.  Ativan 1mg  tablet. May take 1 tablet every 4 hours if needed for nausea/vomiting. This tablet will make you sleepy. Do not drive, operate machinery, climb ladders, etc. (until you know how this medication is going to make you feel).   Compazine 25mg  suppository. May insert 1 suppository into rectum every 6 hours if needed for nausea/vomiting.  SYMPTOMS TO REPORT AS SOON AS POSSIBLE AFTER TREATMENT:  FEVER GREATER THAN 100.5 F  CHILLS WITH OR WITHOUT FEVER  NAUSEA AND VOMITING THAT IS NOT CONTROLLED WITH YOUR NAUSEA MEDICATION  UNUSUAL SHORTNESS OF BREATH  UNUSUAL BRUISING OR BLEEDING  TENDERNESS IN MOUTH AND THROAT WITH OR WITHOUT PRESENCE OF ULCERS  URINARY PROBLEMS  BOWEL PROBLEMS  UNUSUAL RASH    Wear comfortable clothing and clothing appropriate for easy access to any Portacath or PICC line. Let us know if there is anything that we can do to make your therapy better!      I have been informed and understand all of the instructions given to me and have received a copy. I have been instructed to call the clinic [phone] or my family physician as soon as possible for continued medical care, if indicated. I do not have any more questions at this time but understand that I may call the Cancer Center or the Patient  Navigator at [phone] during office hours should I have questions or need assistance in obtaining follow-up care.      _________________________________________      _______________     __________ Signature of Patient or Authorized Representative        Date                            Time      _________________________________________ Nurse's Signature

## 2011-06-22 NOTE — Progress Notes (Signed)
Tolerated treatment dwell.

## 2011-06-24 LAB — MULTIPLE MYELOMA PANEL, SERUM
Albumin ELP: 67.8 % — ABNORMAL HIGH (ref 55.8–66.1)
Alpha-1-Globulin: 4.3 % (ref 2.9–4.9)
IgG (Immunoglobin G), Serum: 554 mg/dL — ABNORMAL LOW (ref 650–1600)
IgM, Serum: 103 mg/dL (ref 41–251)

## 2011-06-25 ENCOUNTER — Encounter (HOSPITAL_BASED_OUTPATIENT_CLINIC_OR_DEPARTMENT_OTHER): Payer: 59

## 2011-06-25 DIAGNOSIS — Z5112 Encounter for antineoplastic immunotherapy: Secondary | ICD-10-CM

## 2011-06-25 DIAGNOSIS — C9000 Multiple myeloma not having achieved remission: Secondary | ICD-10-CM

## 2011-06-25 MED ORDER — BORTEZOMIB CHEMO SQ INJECTION 3.5 MG (2.5MG/ML)
1.3000 mg/m2 | Freq: Once | INTRAMUSCULAR | Status: AC
  Administered 2011-06-25: 3 mg via SUBCUTANEOUS
  Filled 2011-06-25: qty 1.2

## 2011-06-25 MED ORDER — ONDANSETRON HCL 4 MG PO TABS
8.0000 mg | ORAL_TABLET | Freq: Once | ORAL | Status: AC
  Administered 2011-06-25: 8 mg via ORAL
  Filled 2011-06-25: qty 2

## 2011-06-25 NOTE — Progress Notes (Signed)
[name] presents today for injection per MD orders. Velcade 1.3mg  administered SQ in right Abdomen. Administration without incident. Patient tolerated well.

## 2011-06-29 ENCOUNTER — Encounter (HOSPITAL_BASED_OUTPATIENT_CLINIC_OR_DEPARTMENT_OTHER): Payer: 59

## 2011-06-29 DIAGNOSIS — C9000 Multiple myeloma not having achieved remission: Secondary | ICD-10-CM

## 2011-06-29 DIAGNOSIS — Z5112 Encounter for antineoplastic immunotherapy: Secondary | ICD-10-CM

## 2011-06-29 MED ORDER — ONDANSETRON HCL 4 MG PO TABS
8.0000 mg | ORAL_TABLET | Freq: Once | ORAL | Status: AC
  Administered 2011-06-29: 8 mg via ORAL
  Filled 2011-06-29: qty 1

## 2011-06-29 MED ORDER — BORTEZOMIB CHEMO SQ INJECTION 3.5 MG (2.5MG/ML)
1.3000 mg/m2 | Freq: Once | INTRAMUSCULAR | Status: AC
  Administered 2011-06-29: 3 mg via SUBCUTANEOUS
  Filled 2011-06-29: qty 1.2

## 2011-06-29 NOTE — Progress Notes (Signed)
Given zofran 8 mg by mouth, then 25 mins.later Velcade 3 mg.  Tolerated well.

## 2011-06-30 ENCOUNTER — Telehealth (HOSPITAL_COMMUNITY): Payer: Self-pay

## 2011-06-30 NOTE — Telephone Encounter (Signed)
Doing great. No nausea or sickness of any kind. Will call cllnic with any problems.Marland Kitchen

## 2011-07-02 ENCOUNTER — Encounter (HOSPITAL_BASED_OUTPATIENT_CLINIC_OR_DEPARTMENT_OTHER): Payer: 59

## 2011-07-02 DIAGNOSIS — Z5112 Encounter for antineoplastic immunotherapy: Secondary | ICD-10-CM

## 2011-07-02 DIAGNOSIS — C9000 Multiple myeloma not having achieved remission: Secondary | ICD-10-CM

## 2011-07-02 MED ORDER — BORTEZOMIB CHEMO SQ INJECTION 3.5 MG (2.5MG/ML)
1.3000 mg/m2 | Freq: Once | INTRAMUSCULAR | Status: AC
  Administered 2011-07-02: 3 mg via SUBCUTANEOUS
  Filled 2011-07-02: qty 1.2

## 2011-07-02 MED ORDER — ONDANSETRON HCL 4 MG PO TABS
8.0000 mg | ORAL_TABLET | Freq: Once | ORAL | Status: AC
  Administered 2011-07-02: 8 mg via ORAL
  Filled 2011-07-02: qty 2

## 2011-07-02 NOTE — Progress Notes (Signed)
[name] presents today for injection per MD orders. Velcade  administered SQ in right Abdomen. Administration without incident. Patient tolerated well.

## 2011-07-03 ENCOUNTER — Telehealth (HOSPITAL_COMMUNITY): Payer: Self-pay

## 2011-07-03 NOTE — Telephone Encounter (Signed)
msg left to call clinic with problems or concerns.

## 2011-07-03 NOTE — Telephone Encounter (Signed)
Returned follow-up call. C/O pain in lower back starting yesterday. Pain got so bad at one time his neighbor called an ambulance. He was checked out and was found not to need to go to the hospital. He then halved his pain med( "something codeine") Pain  was relieved. He will continue med for pain and call Monday if pain not better or still bothering him.

## 2011-07-10 ENCOUNTER — Encounter (HOSPITAL_COMMUNITY): Payer: Self-pay | Admitting: Oncology

## 2011-07-10 ENCOUNTER — Encounter (HOSPITAL_BASED_OUTPATIENT_CLINIC_OR_DEPARTMENT_OTHER): Payer: 59 | Admitting: Oncology

## 2011-07-10 ENCOUNTER — Ambulatory Visit (HOSPITAL_COMMUNITY): Payer: Medicare Other | Admitting: Oncology

## 2011-07-10 DIAGNOSIS — C9000 Multiple myeloma not having achieved remission: Secondary | ICD-10-CM

## 2011-07-10 NOTE — Progress Notes (Signed)
This office note has been dictated.

## 2011-07-10 NOTE — Patient Instructions (Signed)
[facility]  Discharge Instructions [name]  [id number] 31-Aug-1945  RECOMMENDATIONS MADE BY THE CONSULTANT AND ANY TEST RESULTS WILL BE SENT TO YOUR REFERRING DOCTOR.   EXAM FINDINGS BY MD TODAY AND SIGNS AND SYMPTOMS TO REPORT TO CLINIC OR PRIMARY MD: Exam findings as discussed by Dr. Mariel Sleet.  Follow-up as discussed.  Please report any symptoms of worsening of numbness to hands/feet.   I acknowledge that I have been informed and understand all the instructions given to me and received a copy. I do not have any more questions at this time, but understand that I may call the Specialty Clinic at [facility] at [phone] during business hours should I have any further questions or need assistance in obtaining follow-up care.    __________________________________________  _____________  __________ Signature of Patient or Authorized Representative            Date                   Time    __________________________________________ Nurse's Signature

## 2011-07-10 NOTE — Progress Notes (Signed)
CC:   [name], M.D. Jaclyn Prime. Greggory Stallion, M.D. Payton Doughty, M.D. Jorja Loa, M.D.  DIAGNOSES: 1. Recurrent kappa light chain multiple myeloma with only a couple     small lytic lesions in both humeri which are not new or different.     He is now on bortezomib 4 doses every 21 days, dexamethasone 20 mg     b.i.d. 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 2. Renal insufficiency at the time of presentation requiring temporary     dialysis. 3. 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  He presented that way in     2006 and we will continue that drug presently.  He is also getting     acyclovir coverage as well.  Rodrigus has tolerated this therapy very, very well.  He has some minor erythematous, irritated areas on his tummy from the subQ injections but he states they do not bother him whatsoever and they certainly do not look infected.  He is afebrile today, other vital signs are fine and his blood work is going to be done Monday or Tuesday because he is due for cycle 2 very soon.  He otherwise feels great.  He is a little constipated.  Had 1 episode of low back pain last week for which he took a half of a hydrocodone and it took away the pain within 15-20 minutes.  He basically has had no side effects from the bortezomib that he is aware of.  So he looks very good today.  He had a host of questions and as I told him we are going to give him 4 cycles and if he responds very nicely get him back to Dr. Greggory Stallion for consultation concerning another bone marrow transplant.  Dr. Greggory Stallion was going to check to see if he had any leftover bone marrow from 2006 but I have not  heard from Dr. Greggory Stallion yet.  I will keep Dr. Greggory Stallion informed.  I will see him in 3 weeks or my associate, Dellis Anes, [facility], will.    ______________________________ Ladona Horns. Mariel Sleet, MD ESN/MEDQ  D:  07/10/2011  T:  07/10/2011  Job:  [id number]

## 2011-07-13 ENCOUNTER — Encounter (HOSPITAL_BASED_OUTPATIENT_CLINIC_OR_DEPARTMENT_OTHER): Payer: 59

## 2011-07-13 ENCOUNTER — Other Ambulatory Visit (HOSPITAL_COMMUNITY): Payer: Self-pay | Admitting: Oncology

## 2011-07-13 DIAGNOSIS — Z5112 Encounter for antineoplastic immunotherapy: Secondary | ICD-10-CM

## 2011-07-13 DIAGNOSIS — C9000 Multiple myeloma not having achieved remission: Secondary | ICD-10-CM

## 2011-07-13 LAB — COMPREHENSIVE METABOLIC PANEL
ALT: 30 U/L (ref 0–53)
Alkaline Phosphatase: 48 U/L (ref 39–117)
CO2: 23 mEq/L (ref 19–32)
GFR calc Af Amer: 33 mL/min — ABNORMAL LOW (ref >90)
Glucose, Bld: 115 mg/dL — ABNORMAL HIGH (ref 70–99)
Potassium: 4.4 mEq/L (ref 3.5–5.1)
Sodium: 136 mEq/L (ref 135–145)
Total Protein: 6.2 g/dL (ref 6.0–8.3)

## 2011-07-13 LAB — CBC
Platelets: 70 10*3/uL — ABNORMAL LOW (ref 150–400)
RBC: 3.74 MIL/uL — ABNORMAL LOW (ref 4.22–5.81)
WBC: 6.3 10*3/uL (ref 4.0–10.5)

## 2011-07-13 LAB — DIFFERENTIAL
Lymphocytes Relative: 21 % (ref 12–46)
Lymphs Abs: 1.3 10*3/uL (ref 0.7–4.0)
Neutrophils Relative %: 74 % (ref 43–77)

## 2011-07-13 MED ORDER — BORTEZOMIB CHEMO SQ INJECTION 3.5 MG (2.5MG/ML)
1.0000 mg/m2 | Freq: Once | INTRAMUSCULAR | Status: AC
  Administered 2011-07-13: 2.25 mg via SUBCUTANEOUS
  Filled 2011-07-13: qty 0.9

## 2011-07-13 MED ORDER — ONDANSETRON HCL 4 MG PO TABS
8.0000 mg | ORAL_TABLET | Freq: Once | ORAL | Status: AC
  Administered 2011-07-13: 8 mg via ORAL
  Filled 2011-07-13: qty 1

## 2011-07-13 NOTE — Progress Notes (Signed)
Specimen from left arm for labs. Tolerated well. C/o pain in fingers and hands.  Dr.  Mariel Sleet in to see.  Chemo dose adjusted down by Dr. Mariel Sleet.

## 2011-07-13 NOTE — Patient Instructions (Signed)
[facility] Discharge Instructions for Patients Receiving Chemotherapy  Today you received the following chemotherapy agents Velcade shot given. Dose was reduced today due to numbness in hands.  To help prevent nausea and vomiting after your treatment, we encourage you to take your nausea medication as needed  Dr. Mariel Sleet also would like for you to take a 600 to a 1000 mg  Calcium pill 3 times a week and take 1000 units of vitamin D every day.   If you develop nausea and vomiting that is not controlled by your nausea medication, call the clinic. If it is after clinic hours your family physician or the after hours number for the clinic or go to the Emergency Department.   BELOW ARE SYMPTOMS THAT SHOULD BE REPORTED IMMEDIATELY:  *FEVER GREATER THAN 101.0 F  *CHILLS WITH OR WITHOUT FEVER  NAUSEA AND VOMITING THAT IS NOT CONTROLLED WITH YOUR NAUSEA MEDICATION  *UNUSUAL SHORTNESS OF BREATH  *UNUSUAL BRUISING OR BLEEDING  TENDERNESS IN MOUTH AND THROAT WITH OR WITHOUT PRESENCE OF ULCERS  *URINARY PROBLEMS  *BOWEL PROBLEMS  UNUSUAL RASH Items with * indicate a potential emergency and should be followed up as soon as possible.  One of the nurses will contact you 24 hours after your treatment. Please let the nurse know about any problems that you may have experienced. Feel free to call the clinic you have any questions or concerns. The clinic phone number is [phone].   I have been informed and understand all the instructions given to me. I know to contact the clinic, my physician, or go to the Emergency Department if any problems should occur. I do not have any questions at this time, but understand that I may call the clinic during office hours or the Patient Navigator at [phone] should I have any questions or need assistance in obtaining follow up care.    __________________________________________  _____________  __________ Signature of Patient or  Authorized Representative            Date                   Time    __________________________________________ Nurse's Signature

## 2011-07-14 LAB — KAPPA/LAMBDA LIGHT CHAINS
Kappa free light chain: 27.4 mg/dL — ABNORMAL HIGH (ref 0.33–1.94)
Lambda free light chains: 0.67 mg/dL (ref 0.57–2.63)

## 2011-07-15 LAB — MULTIPLE MYELOMA PANEL, SERUM
Alpha-2-Globulin: 13.2 % — ABNORMAL HIGH (ref 7.1–11.8)
Beta 2: 3.8 % (ref 3.2–6.5)
Gamma Globulin: 7 % — ABNORMAL LOW (ref 11.1–18.8)
IgG (Immunoglobin G), Serum: 413 mg/dL — ABNORMAL LOW (ref 650–1600)
M-Spike, %: NOT DETECTED g/dL

## 2011-07-16 ENCOUNTER — Encounter (HOSPITAL_BASED_OUTPATIENT_CLINIC_OR_DEPARTMENT_OTHER): Payer: 59

## 2011-07-16 ENCOUNTER — Telehealth (HOSPITAL_COMMUNITY): Payer: Self-pay | Admitting: Oncology

## 2011-07-16 DIAGNOSIS — C9000 Multiple myeloma not having achieved remission: Secondary | ICD-10-CM

## 2011-07-16 DIAGNOSIS — Z5112 Encounter for antineoplastic immunotherapy: Secondary | ICD-10-CM

## 2011-07-16 MED ORDER — ONDANSETRON HCL 4 MG PO TABS
8.0000 mg | ORAL_TABLET | Freq: Once | ORAL | Status: AC
  Administered 2011-07-16: 8 mg via ORAL
  Filled 2011-07-16: qty 2

## 2011-07-16 MED ORDER — BORTEZOMIB CHEMO SQ INJECTION 3.5 MG (2.5MG/ML)
1.0000 mg/m2 | Freq: Once | INTRAMUSCULAR | Status: AC
  Administered 2011-07-16: 2.25 mg via SUBCUTANEOUS
  Filled 2011-07-16: qty 0.9

## 2011-07-16 NOTE — Progress Notes (Signed)
[name] presents today for injection per MD orders. velcade 2.25 mg administered SQ in right Abdomen. Administration without incident. Patient tolerated well.

## 2011-07-20 ENCOUNTER — Encounter (HOSPITAL_COMMUNITY): Payer: Medicare Other | Attending: Oncology

## 2011-07-20 DIAGNOSIS — Z5112 Encounter for antineoplastic immunotherapy: Secondary | ICD-10-CM

## 2011-07-20 DIAGNOSIS — C9000 Multiple myeloma not having achieved remission: Secondary | ICD-10-CM

## 2011-07-20 MED ORDER — SODIUM CHLORIDE 0.9 % IJ SOLN
10.0000 mL | INTRAMUSCULAR | Status: DC | PRN
  Administered 2011-07-20: 10 mL
  Filled 2011-07-20: qty 10

## 2011-07-20 MED ORDER — BORTEZOMIB CHEMO SQ INJECTION 3.5 MG (2.5MG/ML)
1.0000 mg/m2 | Freq: Once | INTRAMUSCULAR | Status: AC
  Administered 2011-07-20: 2.25 mg via SUBCUTANEOUS
  Filled 2011-07-20: qty 0.9

## 2011-07-20 MED ORDER — ONDANSETRON HCL 8 MG PO TABS
8.0000 mg | ORAL_TABLET | Freq: Once | ORAL | Status: AC
  Administered 2011-07-20: 8 mg via ORAL
  Filled 2011-07-20: qty 1

## 2011-07-20 MED ORDER — SODIUM CHLORIDE 0.9 % IJ SOLN
INTRAMUSCULAR | Status: AC
  Administered 2011-07-20: 10 mL
  Filled 2011-07-20: qty 10

## 2011-07-20 MED ORDER — SODIUM CHLORIDE 0.9 % IV SOLN
Freq: Once | INTRAVENOUS | Status: AC
  Administered 2011-07-20: 12:00:00 via INTRAVENOUS

## 2011-07-20 MED ORDER — ZOLEDRONIC ACID 4 MG/5ML IV CONC
2.0000 mg | Freq: Once | INTRAVENOUS | Status: AC
  Administered 2011-07-20: 2 mg via INTRAVENOUS
  Filled 2011-07-20: qty 2.5

## 2011-07-20 NOTE — Progress Notes (Signed)
Tolerated zometa and velcade well.

## 2011-07-23 ENCOUNTER — Encounter (HOSPITAL_BASED_OUTPATIENT_CLINIC_OR_DEPARTMENT_OTHER): Payer: Medicare Other

## 2011-07-23 DIAGNOSIS — Z5112 Encounter for antineoplastic immunotherapy: Secondary | ICD-10-CM

## 2011-07-23 DIAGNOSIS — C9000 Multiple myeloma not having achieved remission: Secondary | ICD-10-CM

## 2011-07-23 MED ORDER — BORTEZOMIB CHEMO SQ INJECTION 3.5 MG (2.5MG/ML)
1.0000 mg/m2 | Freq: Once | INTRAMUSCULAR | Status: AC
  Administered 2011-07-23: 2.25 mg via SUBCUTANEOUS
  Filled 2011-07-23: qty 0.9

## 2011-07-23 MED ORDER — ONDANSETRON HCL 4 MG PO TABS
8.0000 mg | ORAL_TABLET | Freq: Once | ORAL | Status: AC
  Administered 2011-07-23: 8 mg via ORAL
  Filled 2011-07-23: qty 2

## 2011-07-23 NOTE — Progress Notes (Signed)
[name] presents today for injection per MD orders. Velcade  administered SQ in right Abdomen. Administration without incident. Patient tolerated well.  

## 2011-08-03 ENCOUNTER — Encounter (HOSPITAL_BASED_OUTPATIENT_CLINIC_OR_DEPARTMENT_OTHER): Payer: Medicare Other | Admitting: Oncology

## 2011-08-03 ENCOUNTER — Other Ambulatory Visit (HOSPITAL_COMMUNITY): Payer: Self-pay | Admitting: Oncology

## 2011-08-03 ENCOUNTER — Encounter (HOSPITAL_COMMUNITY): Payer: Self-pay | Admitting: Oncology

## 2011-08-03 ENCOUNTER — Encounter (HOSPITAL_COMMUNITY): Payer: Medicare Other

## 2011-08-03 DIAGNOSIS — C9000 Multiple myeloma not having achieved remission: Secondary | ICD-10-CM

## 2011-08-03 LAB — COMPREHENSIVE METABOLIC PANEL
ALT: 29 U/L (ref 0–53)
Alkaline Phosphatase: 49 U/L (ref 39–117)
BUN: 47 mg/dL — ABNORMAL HIGH (ref 6–23)
CO2: 25 mEq/L (ref 19–32)
GFR calc Af Amer: 30 mL/min — ABNORMAL LOW (ref >90)
GFR calc non Af Amer: 26 mL/min — ABNORMAL LOW (ref >90)
Glucose, Bld: 109 mg/dL — ABNORMAL HIGH (ref 70–99)
Potassium: 5.2 mEq/L — ABNORMAL HIGH (ref 3.5–5.1)
Sodium: 137 mEq/L (ref 135–145)
Total Bilirubin: 0.3 mg/dL (ref 0.3–1.2)

## 2011-08-03 LAB — CBC
MCV: 101.1 fL — ABNORMAL HIGH (ref 78.0–100.0)
Platelets: 72 10*3/uL — ABNORMAL LOW (ref 150–400)
RBC: 3.54 MIL/uL — ABNORMAL LOW (ref 4.22–5.81)
RDW: 13.5 % (ref 11.5–15.5)
WBC: 7.1 10*3/uL (ref 4.0–10.5)

## 2011-08-03 LAB — DIFFERENTIAL
Basophils Absolute: 0 10*3/uL (ref 0.0–0.1)
Eosinophils Relative: 1 % (ref 0–5)
Lymphocytes Relative: 16 % (ref 12–46)
Lymphs Abs: 1.2 10*3/uL (ref 0.7–4.0)
Neutro Abs: 5.5 10*3/uL (ref 1.7–7.7)
Neutrophils Relative %: 79 % — ABNORMAL HIGH (ref 43–77)

## 2011-08-03 NOTE — Progress Notes (Addendum)
Gregory Ruths, MD, MD [address] [place] [address]  1. Multiple myeloma     CURRENT THERAPY: S/P 2 cycles of Velcade SQ administered on day 1, 4, 8, 11 every 21 days, and dexamethasone 20 mg b.i.d. every Monday, and Zometa support.  INTERVAL HISTORY: [name] 66 y.o. male returns for  regular  visit for followup of  Recurrent kappa light chain multiple myeloma with only a couple small lytic lesions in both humeri which are not new or different. He is now on bortezomib 4 doses every 21 days, dexamethasone 20 mg b.i.d. every Monday, and he is getting Zometa support.  He reports some pain in his neck, shoulder, and hands.  This is likely secondary to his degenerative disk/joint disease.  He is taking some Hydrocodone that Dr. Channing Mutters (Neurosurgeon) provided him.  This help the discomfort some, but does not completely resolve the discomfort.  He does not appreciate taking medications, but I have offered him a strong narcotic, namely oxycodone, but he has refused.   Otherwise, the patient is doing well.  He denies any Nausea and vomiting.  He does reports some fatigue, but explains that when the weather improves, he wishes to take some walks. I have encouraged this. This will be uplifting for his mood as well.   ROS: No TIA's or unusual headaches, no dysphagia.  No prolonged cough. No dyspnea or chest pain on exertion.  No abdominal pain, change in bowel habits, black or bloody stools.  No urinary tract symptoms.  No new or unusual musculoskeletal symptoms.     Past Medical History  Diagnosis Date  . Hypertension   . Multiple myeloma   . Ruptured cervical disc   . Fall resulting in striking against other object     paralyzed    has ESSENTIAL HYPERTENSION, BENIGN; BRADYCARDIA; and Multiple myeloma on his problem list.      has no known allergies.  Mr. Hlavaty does not currently have medications on file.  Past Surgical History  Procedure Date  .  Inguinal hernia repair   . Anterior cervical decomp/discectomy fusion   . Bone marrow aspirate and biopsy wiith lumbar puncture   . Melanoma excision     rt. breast  . Cervical spine surgery     Denies any headaches, dizziness, double vision, fevers, chills, night sweats, nausea, vomiting, diarrhea, constipation, chest pain, heart palpitations, shortness of breath, blood in stool, black tarry stool, urinary pain, urinary burning, urinary frequency, hematuria.   PHYSICAL EXAMINATION  ECOG PERFORMANCE STATUS: 1 - Symptomatic but completely ambulatory  Filed Vitals:   08/03/11 1104  BP: 147/93  Pulse: 66  Temp: 97.9 F (36.6 C)    GENERAL:alert, no distress, well nourished, well developed, comfortable, cooperative, obese and smiling SKIN: skin color, texture, turgor are normal, no rashes or significant lesions HEAD: Normocephalic, No masses, lesions, tenderness or abnormalities EYES: normal, PERRLA, EOMI EARS: External ears normal OROPHARYNX:lips, buccal mucosa, and tongue normal and mucous membranes are moist  NECK: supple, no adenopathy, no bruits, no JVD, thyroid normal size, non-tender, without nodularity, no stridor, non-tender, trachea midline LYMPH:  no palpable lymphadenopathy BREAST:not examined LUNGS: clear to auscultation and percussion HEART: regular rate & rhythm, no murmurs, no gallops, S1 normal and S2 normal ABDOMEN:abdomen soft, non-tender, obese, normal bowel sounds, no masses or organomegaly and no hepatosplenomegaly BACK: Back symmetric, no curvature., No CVA tenderness EXTREMITIES:less then 2 second capillary refill, no joint deformities, effusion, or inflammation, no  edema, no skin discoloration, no clubbing, no cyanosis  NEURO: alert & oriented x 3 with fluent speech, no focal motor/sensory deficits, gait normal   LABORATORY DATA: CBC    Component Value Date/Time   WBC 7.1 08/03/2011 1057   RBC 3.54* 08/03/2011 1057   HGB 12.2* 08/03/2011 1057   HCT  35.8* 08/03/2011 1057   PLT 72* 08/03/2011 1057   MCV 101.1* 08/03/2011 1057   MCH 34.5* 08/03/2011 1057   MCHC 34.1 08/03/2011 1057   RDW 13.5 08/03/2011 1057   LYMPHSABS 1.2 08/03/2011 1057   MONOABS 0.3 08/03/2011 1057   EOSABS 0.1 08/03/2011 1057   BASOSABS 0.0 08/03/2011 1057      ASSESSMENT:  1. Recurrent kappa light chain multiple myeloma with only a couple small lytic lesions in both humeri which are not new or different. He is now on bortezomib 4 doses every 21 days, dexamethasone 20 mg b.i.d. 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  2. Renal insufficiency at the time of presentation requiring temporary dialysis.  3. 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 He presented that way in 2006 and we will continue that drug presently. He is also getting acyclovir coverage as well.   PLAN:  1. Lab work today: CBC diff in preparation for his Velcade injection 2. Platelet count today is 72000.  Hold Velcade injection today and defer x 7 days.  Will perform a CBC diff in 7 days and if treatment parameters met, will administer Velcade injection. 3. Encouraged the patient to go for walks on nice days.  4. Encouraged the patient to utilize his pain medication when needed. 5. Continue bowel regimen. 6. Will administer 4 cycles of Velcade chemotherapy and then have him see Dr. Greggory Stallion at [facility].  7. Monthly lab work: CMET, MM Panel 8. Pre-chemo lab work: CBC diff 9. Will check a BMET before administration of Zometa in April 10. Return in one month for follow-up.  All questions were answered. The patient knows to call the clinic with any problems, questions or concerns. We can certainly see  the patient much sooner if necessary.  The patient and plan discussed with [name], MD and he is in agreement with the aforementioned.  [name]

## 2011-08-03 NOTE — Patient Instructions (Signed)
[facility]  Discharge Instructions  RECOMMENDATIONS MADE BY THE CONSULTANT AND ANY TEST RESULTS WILL BE SENT TO YOUR REFERRING DOCTOR.   EXAM FINDINGS BY MD TODAY AND SIGNS AND SYMPTOMS TO REPORT TO CLINIC OR PRIMARY MD:  We will hold treatment today due to platelets low.   SPECIAL INSTRUCTIONS/FOLLOW-UP: Return next Monday for labs and poss treatment.   I acknowledge that I have been informed and understand all the instructions given to me and received a copy. I do not have any more questions at this time, but understand that I may call the Specialty Clinic at [facility] at [phone] during business hours should I have any further questions or need assistance in obtaining follow-up care.    __________________________________________  _____________  __________ Signature of Patient or Authorized Representative            Date                   Time    __________________________________________ Nurse's Signature

## 2011-08-03 NOTE — Progress Notes (Signed)
[name] presented for labwork. Labs per MD order drawn via Peripheral Line 23 gauge needle inserted in right AC  Good blood return present. Procedure without incident.  Needle removed intact. Patient tolerated procedure well.

## 2011-08-04 ENCOUNTER — Encounter: Payer: Self-pay | Admitting: Oncology

## 2011-08-05 LAB — MULTIPLE MYELOMA PANEL, SERUM
Gamma Globulin: 6.4 % — ABNORMAL LOW (ref 11.1–18.8)
IgG (Immunoglobin G), Serum: 344 mg/dL — ABNORMAL LOW (ref 650–1600)
M-Spike, %: NOT DETECTED g/dL

## 2011-08-05 LAB — KAPPA/LAMBDA LIGHT CHAINS: Kappa free light chain: 19.1 mg/dL — ABNORMAL HIGH (ref 0.33–1.94)

## 2011-08-06 ENCOUNTER — Inpatient Hospital Stay (HOSPITAL_COMMUNITY): Payer: Medicare Other

## 2011-08-06 ENCOUNTER — Ambulatory Visit (HOSPITAL_COMMUNITY)
Admission: RE | Admit: 2011-08-06 | Discharge: 2011-08-06 | Disposition: A | Discharge status: Home / Self Care | Payer: Medicare Other | Source: Ambulatory Visit | Attending: Internal Medicine | Admitting: Internal Medicine

## 2011-08-06 ENCOUNTER — Other Ambulatory Visit (HOSPITAL_COMMUNITY): Payer: Self-pay | Admitting: Internal Medicine

## 2011-08-06 DIAGNOSIS — R05 Cough: Secondary | ICD-10-CM

## 2011-08-06 DIAGNOSIS — J069 Acute upper respiratory infection, unspecified: Secondary | ICD-10-CM

## 2011-08-06 DIAGNOSIS — Z981 Arthrodesis status: Secondary | ICD-10-CM | POA: Insufficient documentation

## 2011-08-06 DIAGNOSIS — R059 Cough, unspecified: Secondary | ICD-10-CM | POA: Insufficient documentation

## 2011-08-09 ENCOUNTER — Emergency Department (HOSPITAL_COMMUNITY): Payer: Medicare Other

## 2011-08-09 ENCOUNTER — Emergency Department (HOSPITAL_COMMUNITY)
Admission: EM | Admit: 2011-08-09 | Discharge: 2011-08-09 | Disposition: A | Discharge status: Home / Self Care | Payer: Medicare Other | Attending: Emergency Medicine | Admitting: Emergency Medicine

## 2011-08-09 ENCOUNTER — Encounter (HOSPITAL_COMMUNITY): Payer: Self-pay | Admitting: *Deleted

## 2011-08-09 DIAGNOSIS — C9000 Multiple myeloma not having achieved remission: Secondary | ICD-10-CM | POA: Insufficient documentation

## 2011-08-09 DIAGNOSIS — I10 Essential (primary) hypertension: Secondary | ICD-10-CM | POA: Insufficient documentation

## 2011-08-09 DIAGNOSIS — M545 Low back pain, unspecified: Secondary | ICD-10-CM | POA: Insufficient documentation

## 2011-08-09 DIAGNOSIS — Z79899 Other long term (current) drug therapy: Secondary | ICD-10-CM | POA: Insufficient documentation

## 2011-08-09 MED ORDER — HYDROMORPHONE HCL PF 2 MG/ML IJ SOLN
2.0000 mg | Freq: Once | INTRAMUSCULAR | Status: AC
  Administered 2011-08-09: 2 mg via INTRAMUSCULAR
  Filled 2011-08-09: qty 1

## 2011-08-09 MED ORDER — CYCLOBENZAPRINE HCL 10 MG PO TABS: 10.0000 mg | ORAL_TABLET | Freq: Two times a day (BID) | ORAL | Status: AC | PRN

## 2011-08-09 NOTE — ED Provider Notes (Signed)
This chart was scribed for Donnetta Hutching, MD by Wallis Mart. The patient was seen in room APA12/APA12 and the patient's care was started at 3:34PM.   CSN: [id number]  Arrival date & time 08/09/11  1506   First MD Initiated Contact with Patient 08/09/11 1526      Chief Complaint  Patient presents with  . Back Pain  . Flank Pain    (Consider location/radiation/quality/duration/timing/severity/associated sxs/prior treatment) HPI  [name] is a 66 y.o. male who presents to the Emergency Department complaining of sudden onset, persistence of constant, gradually worsening, moderate, non-radiating lower back pain onset 2 weeks ago. Pt denies any precipitating events to trigger the back pain.Pt denies leg pain, SOB, CP but c/o feet pain.   Pt saw PCP Thursday for side pain. Pt dx'ed w/ multiple myeloma  in 2006, went into remission 6 years ago but cancer has returned and pt is currently on chemo, 2 X a week (M, TH). There are no other associated symptoms and no other alleviating or aggravating factors.    Pt takes despotestosterone cypionate injections and prednisone.     Pt sees Dr. Greggory Stallion at [facility] and Dr. Mariel Sleet @ [facility]   PCP: Dr. Maebelle Munroe Past Medical History  Diagnosis Date  . Hypertension   . Multiple myeloma   . Ruptured cervical disc   . Fall resulting in striking against other object     paralyzed    Past Surgical History  Procedure Date  . Inguinal hernia repair   . Anterior cervical decomp/discectomy fusion   . Bone marrow aspirate and biopsy wiith lumbar puncture   . Melanoma excision     rt. breast  . Cervical spine surgery     Family History  Problem Relation Age of Onset  . Cancer Sister   . Cancer Brother     History  Substance Use Topics  . Smoking status: Never Smoker   . Smokeless tobacco: Never Used  . Alcohol Use: No      Review of Systems 10 Systems reviewed and are negative for acute change except as noted in the  HPI.  Allergies  Review of patient's allergies indicates no known allergies.  Home Medications   Current Outpatient Rx  Name Route Sig Dispense Refill  . ACYCLOVIR 400 MG PO TABS Oral Take 400 mg by mouth daily. Pt to start taking this June 18, 2011.    Marland Kitchen ALLOPURINOL 100 MG PO TABS Oral Take 100 mg by mouth daily.      Robie Ridge IV Intravenous Inject into the vein. Days 1, 4, 8, 11 every 21 days    . DEXAMETHASONE 4 MG PO TABS  Take 5 pills in the am and 5 pills in the pm every Monday. Take with food. Start taking on Monday Jun 22, 2011.    Marland Kitchen OMEGA-3 FATTY ACIDS 1000 MG PO CAPS Oral Take 2 g by mouth daily.      . FUROSEMIDE 40 MG PO TABS Oral Take 40 mg by mouth daily.      Marland Kitchen LISINOPRIL 10 MG PO TABS Oral Take 10 mg by mouth daily.      Marland Kitchen LORAZEPAM 1 MG PO TABS Oral Take 1 tablet (1 mg total) by mouth every 4 (four) hours as needed (Nausea or vomiting). 30 tablet 0  . ONDANSETRON HCL 8 MG PO TABS Oral Take 1 tablet (8 mg total) by mouth 2 (two) times daily as needed. Take 1 tablet two times a day as needed  for nausea or vomiting. 30 tablet 1  . PROCHLORPERAZINE 25 MG RE SUPP Rectal Place 1 suppository (25 mg total) rectally every 6 (six) hours as needed for nausea. 12 suppository 2  . KAYEXALATE PO Oral Take by mouth once a week.      . TESTOSTERONE CYPIONATE 200 MG/ML IM OIL Intramuscular Inject into the muscle 3 (three) times a week.        BP 135/86  Pulse 94  Temp(Src) 98.3 F (36.8 C) (Oral)  Resp 20  Ht 5' 10.5" (1.791 m)  Wt 232 lb (105.235 kg)  BMI 32.82 kg/m2  SpO2 100%  Physical Exam  Nursing note and vitals reviewed. Constitutional: He is oriented to person, place, and time. He appears well-developed and well-nourished. No distress.       Pt is overweight, moon face due to taking prednisone   HENT:  Head: Normocephalic and atraumatic.  Eyes: EOM are normal. Pupils are equal, round, and reactive to light.  Neck: Normal range of motion. Neck supple. No tracheal  deviation present.  Cardiovascular: Normal rate and regular rhythm.   Pulmonary/Chest: Effort normal and breath sounds normal. No respiratory distress.  Abdominal: Soft. He exhibits no distension.  Musculoskeletal: Normal range of motion. He exhibits tenderness. He exhibits no edema.       Tenderness in lower back  L2, L3,  Neurological: He is alert and oriented to person, place, and time. No sensory deficit.  Skin: Skin is warm and dry.  Psychiatric: He has a normal mood and affect. His behavior is normal.    ED Course  Procedures (including critical care time) DIAGNOSTIC STUDIES: Oxygen Saturation is 100% on room air, normal by my interpretation.    COORDINATION OF CARE:  3:30 PM: Pt to be given dilaudid injection.   Labs Reviewed - No data to display Dg Lumbar Spine Complete  08/09/2011  *RADIOLOGY REPORT*  Clinical Data: Back pain.  History multiple myeloma.  LUMBAR SPINE - COMPLETE 4+ VIEW  Comparison: Osseous survey 06/19/2011.  Findings: Vertebral body height is maintained.  Multilevel degenerative disease appears worst at L4-5.  No lytic or sclerotic lesion is seen.  IMPRESSION: No acute finding.  Stable compared to prior exam.  Original Report Authenticated By: Bernadene Bell. Maricela Curet, M.D.     No diagnosis found.    MDM  Status post diagnosis of multiple myeloma 2006. Presently on chemotherapy. Plain films of lumbar spine were negative. No radicular symptoms. Patient has his own pain medication. Will have Flexeril for muscle spasm.   I personally performed the services described in this documentation, which was scribed in my presence. The recorded information has been reviewed and considered.       Donnetta Hutching, MD 08/09/11 [phone]

## 2011-08-09 NOTE — ED Notes (Addendum)
Pt states has flank and lower back pain. Pt reports he is a chemo pt, last treatment 10 days ago. Pt denies fever,SOB and chest pain.

## 2011-08-09 NOTE — Discharge Instructions (Signed)
X-ray of lower back was normal.  Continue to take your pain pills. Additional prescription for muscle relaxer. Followup your oncologist.

## 2011-08-10 ENCOUNTER — Encounter (HOSPITAL_COMMUNITY): Payer: Medicare Other

## 2011-08-10 ENCOUNTER — Encounter (HOSPITAL_BASED_OUTPATIENT_CLINIC_OR_DEPARTMENT_OTHER): Payer: Medicare Other

## 2011-08-10 ENCOUNTER — Encounter (HOSPITAL_BASED_OUTPATIENT_CLINIC_OR_DEPARTMENT_OTHER): Payer: Medicare Other | Admitting: Oncology

## 2011-08-10 DIAGNOSIS — N390 Urinary tract infection, site not specified: Secondary | ICD-10-CM

## 2011-08-10 DIAGNOSIS — C9000 Multiple myeloma not having achieved remission: Secondary | ICD-10-CM

## 2011-08-10 DIAGNOSIS — M549 Dorsalgia, unspecified: Secondary | ICD-10-CM

## 2011-08-10 LAB — DIFFERENTIAL
Basophils Absolute: 0 10*3/uL (ref 0.0–0.1)
Basophils Relative: 0 % (ref 0–1)
Neutro Abs: 3 10*3/uL (ref 1.7–7.7)
Neutrophils Relative %: 69 % (ref 43–77)

## 2011-08-10 LAB — COMPREHENSIVE METABOLIC PANEL
AST: 27 U/L (ref 0–37)
Albumin: 2.9 g/dL — ABNORMAL LOW (ref 3.5–5.2)
Calcium: 7.4 mg/dL — ABNORMAL LOW (ref 8.4–10.5)
Chloride: 100 mEq/L (ref 96–112)
Creatinine, Ser: 3.08 mg/dL — ABNORMAL HIGH (ref 0.50–1.35)
Total Bilirubin: 0.4 mg/dL (ref 0.3–1.2)
Total Protein: 5.8 g/dL — ABNORMAL LOW (ref 6.0–8.3)

## 2011-08-10 LAB — CBC
MCHC: 34 g/dL (ref 30.0–36.0)
Platelets: 61 10*3/uL — ABNORMAL LOW (ref 150–400)
RDW: 13.6 % (ref 11.5–15.5)

## 2011-08-10 NOTE — Patient Instructions (Signed)
[name]  [id number] 12/26/1945  [facility]  Discharge Instructions  RECOMMENDATIONS MADE BY THE CONSULTANT AND ANY TEST RESULTS WILL BE SENT TO YOUR REFERRING DOCTOR.   EXAM FINDINGS BY MD TODAY AND SIGNS AND SYMPTOMS TO REPORT TO CLINIC OR PRIMARY MD: We will hold chemotherapy for a week.  MEDICATIONS PRESCRIBED: none   INSTRUCTIONS GIVEN AND DISCUSSED: Other :  Fevers, chills, shortness of breath, etc.  SPECIAL INSTRUCTIONS/FOLLOW-UP: Return to Clinic on Tuesday to see MD   I acknowledge that I have been informed and understand all the instructions given to me and received a copy. I do not have any more questions at this time, but understand that I may call the Specialty Clinic at [facility] at [phone] during business hours should I have any further questions or need assistance in obtaining follow-up care.    __________________________________________  _____________  __________ Signature of Patient or Authorized Representative            Date                   Time    __________________________________________ Nurse's Signature

## 2011-08-10 NOTE — Progress Notes (Signed)
Labs drawn today for cbc/diff 

## 2011-08-10 NOTE — Progress Notes (Signed)
Subjective: Patient is seen as a walk-in today. He is scheduled today for his Velcade injection. Last week his Velcade was held due to thrombocytopenia with a platelet count of 72,000. During the nurse's assessment, the patient reports he is been feeling poor and I was therefore asked to see the patient today.  Patient reports that last week around Tuesday, the patient began feeling poor. He reports that he aches and a sore all over. He reports some right lower costophrenic margin discomfort and low back pain. He reported to his primary care physician initially last Thursday. A chest x-ray was performed and was negative for any acute abnormalities. He was placed on an antibiotic, the patient is unable to tell me the name of the medication, and also a cough suppressant.  Over the weekend, the patient continued to feel poor and reported to the emergency department. While in the emergency department a lumbar plain x-ray was performed which did not reveal any acute abnormalities. He was diagnosed and discharged home with low back muscle spasms.  He is given a prescription for Flexeril, but he has not done this medication filled. He asked for my opinion regarding this medication. His low back pain does seem to be lateral to the spine and is intermittent. There is no muscle tenderness appreciated on exam, however his complaints do sound as though there are musculoskeletal in nature and a muscle relaxant may be beneficial. I did warn him that Flexeril may cause some drowsiness.  The patient reports that he did not feel any better while he is on his dexamethasone.  Patient does admit to a cough that is productive of Green sputum. He denies any nasal discharge. He denies a sore throat. He reports that he has 3-4 more days left of his antibiotic therapy.  He denies any headaches, dizziness, double vision, fevers, chills, night sweats, chest pain, shortness of breath, heart palpitations, nausea, vomiting, diarrhea,  constipation, abdominal pain, blood in stool, black tarry stool, urinary pain, urinary frequency, hematuria.  Objective:  Gen.: Patient seen in a wheelchair. He is alert oriented x3. Is not appear been acute distress. HEENT: Atraumatic, normocephalic anicteric sclera. Neck: Trachea midline Cardiac: Regular rate and rhythm without murmur rub or gallop. No S3 or S4 appreciated. Lungs: clear to auscultation bilaterally without wheezes rales or rhonchi posteriorly or anteriorly. Special attention given to the right lower anterior fields and auscultation was negative for any abnormalities. Abdomen: Positive bowel sounds in all 4 quadrants. Soft. Nontender. Extremities: No lower extremity edema appreciated bilaterally. Skin: Capillary refill less in the second fingers. Neuro: No focal deficits appreciated. Patient is alert oriented x3.  Assessment: 1. URI, on antibiotics 2. Multiple myeloma, S/P 2 cycles of Velcade SQ therapy. 3. Low back discomfort 4. Cough, productive of green sputum  Plan: 1. Hold Velcade today. 2. Defer treatment by 7 days. 3. Complete antibiotic therapy 4. Continue with oxycodone as needed for pain 5. Patient education regarding Flexeril provided. Patient has not filled this prescription and will consider filling it. 6. continue bowel regimen 7. Lab work today: CBC diff, CMET 8. Return for followup prior to chemotherapy administration next Tuesday  All questions were answered. The patient knows to call the clinic with any problems, questions, or concerns.  Patient and plan discussed with Dr. [name] and he is in agreement with the aforementioned.  Karys Meckley

## 2011-08-13 ENCOUNTER — Inpatient Hospital Stay (HOSPITAL_COMMUNITY): Payer: Medicare Other

## 2011-08-14 ENCOUNTER — Encounter (HOSPITAL_COMMUNITY): Payer: Self-pay

## 2011-08-14 ENCOUNTER — Emergency Department (HOSPITAL_COMMUNITY)
Admission: EM | Admit: 2011-08-14 | Discharge: 2011-08-14 | Disposition: A | Discharge status: Home / Self Care | Payer: Medicare Other | Attending: Emergency Medicine | Admitting: Emergency Medicine

## 2011-08-14 ENCOUNTER — Emergency Department (HOSPITAL_COMMUNITY): Payer: Medicare Other

## 2011-08-14 DIAGNOSIS — J40 Bronchitis, not specified as acute or chronic: Secondary | ICD-10-CM | POA: Insufficient documentation

## 2011-08-14 DIAGNOSIS — R0602 Shortness of breath: Secondary | ICD-10-CM | POA: Insufficient documentation

## 2011-08-14 DIAGNOSIS — Z79899 Other long term (current) drug therapy: Secondary | ICD-10-CM | POA: Insufficient documentation

## 2011-08-14 DIAGNOSIS — I10 Essential (primary) hypertension: Secondary | ICD-10-CM | POA: Insufficient documentation

## 2011-08-14 DIAGNOSIS — R059 Cough, unspecified: Secondary | ICD-10-CM | POA: Insufficient documentation

## 2011-08-14 DIAGNOSIS — R05 Cough: Secondary | ICD-10-CM | POA: Insufficient documentation

## 2011-08-14 LAB — BASIC METABOLIC PANEL
BUN: 47 mg/dL — ABNORMAL HIGH (ref 6–23)
CO2: 25 mEq/L (ref 19–32)
Calcium: 8.5 mg/dL (ref 8.4–10.5)
Chloride: 101 mEq/L (ref 96–112)
Creatinine, Ser: 2.71 mg/dL — ABNORMAL HIGH (ref 0.50–1.35)
Glucose, Bld: 135 mg/dL — ABNORMAL HIGH (ref 70–99)

## 2011-08-14 LAB — DIFFERENTIAL
Basophils Absolute: 0 10*3/uL (ref 0.0–0.1)
Eosinophils Relative: 1 % (ref 0–5)
Lymphs Abs: 1.7 10*3/uL (ref 0.7–4.0)
Monocytes Absolute: 0.6 10*3/uL (ref 0.1–1.0)
Monocytes Relative: 9 % (ref 3–12)
Neutro Abs: 4 10*3/uL (ref 1.7–7.7)
Neutrophils Relative %: 63 % (ref 43–77)

## 2011-08-14 LAB — CBC
HCT: 34 % — ABNORMAL LOW (ref 39.0–52.0)
Hemoglobin: 11.7 g/dL — ABNORMAL LOW (ref 13.0–17.0)
MCH: 34.2 pg — ABNORMAL HIGH (ref 26.0–34.0)
MCV: 99.4 fL (ref 78.0–100.0)
RBC: 3.42 MIL/uL — ABNORMAL LOW (ref 4.22–5.81)

## 2011-08-14 MED ORDER — ALBUTEROL SULFATE (5 MG/ML) 0.5% IN NEBU
5.0000 mg | INHALATION_SOLUTION | Freq: Once | RESPIRATORY_TRACT | Status: AC
  Administered 2011-08-14: 5 mg via RESPIRATORY_TRACT
  Filled 2011-08-14: qty 1

## 2011-08-14 MED ORDER — AZITHROMYCIN 250 MG PO TABS: 250.0000 mg | ORAL_TABLET | Freq: Every day | ORAL | Status: AC

## 2011-08-14 NOTE — Discharge Instructions (Signed)

## 2011-08-14 NOTE — ED Notes (Signed)
Patient with no complaints at this time. Respirations even and unlabored. Skin warm/dry. Discharge instructions reviewed with patient at this time. Patient given opportunity to voice concerns/ask questions. Patient discharged at this time and left Emergency Department with steady gait.   

## 2011-08-14 NOTE — ED Provider Notes (Signed)
History     CSN: [id number]  Arrival date & time 08/14/11  1618   First MD Initiated Contact with Patient 08/14/11 1633      Chief Complaint  Patient presents with  . Sore Throat  . Cough    (Consider location/radiation/quality/duration/timing/severity/associated sxs/prior treatment) HPI Comments: Hx MM on chemo  Patient is a 66 y.o. male presenting with pharyngitis and cough. The history is provided by the patient. No language interpreter was used.  Sore Throat This is a new problem. The current episode started 2 days ago. The problem occurs constantly. The problem has been gradually worsening. Associated symptoms include shortness of breath. Pertinent negatives include no chest pain, no abdominal pain and no headaches. The symptoms are aggravated by swallowing and coughing. The symptoms are relieved by nothing. He has tried nothing for the symptoms.  Cough This is a new problem. The current episode started more than 1 week ago. The problem occurs constantly. The problem has not changed since onset.The cough is productive of sputum. There has been no fever. Associated symptoms include shortness of breath and wheezing. Pertinent negatives include no chest pain, no chills, no sweats, no ear congestion, no ear pain, no headaches, no rhinorrhea, no sore throat, no myalgias and no eye redness. He has tried nothing for the symptoms.    Past Medical History  Diagnosis Date  . Hypertension   . Multiple myeloma   . Ruptured cervical disc   . Fall resulting in striking against other object     paralyzed    Past Surgical History  Procedure Date  . Inguinal hernia repair   . Anterior cervical decomp/discectomy fusion   . Bone marrow aspirate and biopsy wiith lumbar puncture   . Melanoma excision     rt. breast  . Cervical spine surgery     Family History  Problem Relation Age of Onset  . Cancer Sister   . Cancer Brother     History  Substance Use Topics  . Smoking status:  Never Smoker   . Smokeless tobacco: Never Used  . Alcohol Use: No      Review of Systems  Constitutional: Negative for fever, chills, activity change, appetite change and fatigue.  HENT: Positive for facial swelling. Negative for ear pain, congestion, sore throat, rhinorrhea and neck pain.   Eyes: Negative for redness.  Respiratory: Positive for cough, shortness of breath and wheezing.   Cardiovascular: Negative for chest pain and palpitations.  Gastrointestinal: Negative for nausea, vomiting and abdominal pain.  Genitourinary: Negative for dysuria, urgency, frequency and flank pain.  Musculoskeletal: Negative for myalgias, back pain and arthralgias.  Neurological: Negative for dizziness, weakness, light-headedness, numbness and headaches.  All other systems reviewed and are negative.    Allergies  Review of patient's allergies indicates no known allergies.  Home Medications   Current Outpatient Rx  Name Route Sig Dispense Refill  . ACYCLOVIR 400 MG PO TABS Oral Take 400 mg by mouth daily. Pt to start taking this June 18, 2011.    Marland Kitchen ALLOPURINOL 100 MG PO TABS Oral Take 100 mg by mouth daily.      Marland Kitchen DEXAMETHASONE 4 MG PO TABS  Take 5 pills in the am and 5 pills in the pm every Monday. Take with food. Start taking on Monday Jun 22, 2011.    Marland Kitchen OMEGA-3 FATTY ACIDS 1000 MG PO CAPS Oral Take 2 g by mouth daily.      . FUROSEMIDE 40 MG PO TABS Oral Take  40 mg by mouth daily.      Marland Kitchen LISINOPRIL 10 MG PO TABS Oral Take 10 mg by mouth daily.      Marland Kitchen KAYEXALATE PO Oral Take by mouth once a week. On Saturdays    . TESTOSTERONE CYPIONATE 200 MG/ML IM OIL Intramuscular Inject into the muscle every 21 ( twenty-one) days. Injection due every 3 weeks    . AZITHROMYCIN 250 MG PO TABS Oral Take 1 tablet (250 mg total) by mouth daily. Take first 2 tablets together, then 1 every day until finished. 6 tablet 0  . VELCADE IV Intravenous Inject into the vein. Days 1, 4, 8, 11 every 21 days    .  CYCLOBENZAPRINE HCL 10 MG PO TABS Oral Take 1 tablet (10 mg total) by mouth 2 (two) times daily as needed for muscle spasms. 20 tablet 0  . LORAZEPAM 1 MG PO TABS Oral Take 1 tablet (1 mg total) by mouth every 4 (four) hours as needed (Nausea or vomiting). 30 tablet 0  . ONDANSETRON HCL 8 MG PO TABS Oral Take 1 tablet (8 mg total) by mouth 2 (two) times daily as needed. Take 1 tablet two times a day as needed for nausea or vomiting. 30 tablet 1  . PROCHLORPERAZINE 25 MG RE SUPP Rectal Place 1 suppository (25 mg total) rectally every 6 (six) hours as needed for nausea. 12 suppository 2    BP 140/83  Pulse 85  Temp(Src) 97.8 F (36.6 C) (Oral)  Resp 22  Ht 5' 10.5" (1.791 m)  Wt 230 lb (104.327 kg)  BMI 32.54 kg/m2  SpO2 97%  Physical Exam  Nursing note and vitals reviewed. Constitutional: He is oriented to person, place, and time. He appears well-developed and well-nourished. No distress.  HENT:  Head: Normocephalic and atraumatic.  Mouth/Throat: Oropharynx is clear and moist.       Swollen face secondary to chronic steroid use  Eyes: Conjunctivae and EOM are normal. Pupils are equal, round, and reactive to light.  Neck: Normal range of motion. Neck supple.  Cardiovascular: Normal rate, regular rhythm, normal heart sounds and intact distal pulses.  Exam reveals no gallop and no friction rub.   No murmur heard. Pulmonary/Chest: Effort normal. No respiratory distress. He has wheezes (diffuse).  Abdominal: Soft. Bowel sounds are normal. There is no tenderness.  Musculoskeletal: Normal range of motion. He exhibits no edema and no tenderness.  Neurological: He is alert and oriented to person, place, and time. No cranial nerve deficit.  Skin: Skin is warm and dry. No rash noted.    ED Course  Procedures (including critical care time)  Labs Reviewed  CBC - Abnormal; Notable for the following:    RBC 3.42 (*)    Hemoglobin 11.7 (*)    HCT 34.0 (*)    MCH 34.2 (*)    Platelets 70 (*)     All other components within normal limits  BASIC METABOLIC PANEL - Abnormal; Notable for the following:    Glucose, Bld 135 (*)    BUN 47 (*)    Creatinine, Ser 2.71 (*)    GFR calc non Af Amer 23 (*)    GFR calc Af Amer 27 (*)    All other components within normal limits  DIFFERENTIAL   Dg Chest 2 View  08/14/2011  *RADIOLOGY REPORT*  Clinical Data: Cough.  CHEST - 2 VIEW  Comparison: 08/06/2011.  Findings: Cardiopericardial silhouette is within normal limits. Stable prominence of the right hilum.  Chronic  bronchitic changes are again noted.  Mediastinal contours are unchanged.  Partially visualized cervical fixation hardware.  No airspace disease.  No effusion.  Thoracic spine DISH. Suboptimal lateral view with increased density over the anterior mediastinum because the arms are not raised over head.  IMPRESSION: No acute cardiopulmonary disease.  Original Report Authenticated By: Andreas Newport, M.D.     1. Bronchitis       MDM  Chest x-ray unremarkable. He received a breathing treatment in emergency department with resolution of his wheezing. States he's feeling in fruits. He was not placed on steroids as he is on chronic steroid treatment. I did not administer an albuterol inhaler as he states he has one at home in which to use. I instructed him to use this every 4 hours. Was placed on a Z-Pak. Discharged with instructions to followup with his primary care physician. Creatinine is chronically high however improved from prior visits.        Dayton Bailiff, MD 08/14/11 [phone]

## 2011-08-14 NOTE — ED Notes (Signed)
C/o sore throat and cough for 2 weeks. Pt cancer pt currently receiving chemo

## 2011-08-17 ENCOUNTER — Inpatient Hospital Stay (HOSPITAL_COMMUNITY): Payer: 59

## 2011-08-17 ENCOUNTER — Ambulatory Visit (HOSPITAL_COMMUNITY): Payer: Medicare Other

## 2011-08-18 ENCOUNTER — Encounter (HOSPITAL_COMMUNITY): Payer: Medicare Other | Attending: Oncology | Admitting: Oncology

## 2011-08-18 ENCOUNTER — Inpatient Hospital Stay (HOSPITAL_COMMUNITY): Payer: Self-pay

## 2011-08-18 DIAGNOSIS — D801 Nonfamilial hypogammaglobulinemia: Secondary | ICD-10-CM

## 2011-08-18 DIAGNOSIS — J209 Acute bronchitis, unspecified: Secondary | ICD-10-CM

## 2011-08-18 DIAGNOSIS — C9000 Multiple myeloma not having achieved remission: Secondary | ICD-10-CM | POA: Insufficient documentation

## 2011-08-18 LAB — CBC
HCT: 34.9 % — ABNORMAL LOW (ref 39.0–52.0)
MCH: 33.6 pg (ref 26.0–34.0)
MCHC: 34.7 g/dL (ref 30.0–36.0)
MCV: 96.9 fL (ref 78.0–100.0)
Platelets: 95 10*3/uL — ABNORMAL LOW (ref 150–400)
RDW: 13 % (ref 11.5–15.5)
WBC: 11.7 10*3/uL — ABNORMAL HIGH (ref 4.0–10.5)

## 2011-08-18 LAB — DIFFERENTIAL
Basophils Absolute: 0 10*3/uL (ref 0.0–0.1)
Basophils Relative: 0 % (ref 0–1)
Eosinophils Absolute: 0 10*3/uL (ref 0.0–0.7)
Eosinophils Relative: 0 % (ref 0–5)
Monocytes Absolute: 0.4 10*3/uL (ref 0.1–1.0)

## 2011-08-18 LAB — COMPREHENSIVE METABOLIC PANEL
ALT: 24 U/L (ref 0–53)
AST: 20 U/L (ref 0–37)
CO2: 22 mEq/L (ref 19–32)
Calcium: 9 mg/dL (ref 8.4–10.5)
Creatinine, Ser: 2.26 mg/dL — ABNORMAL HIGH (ref 0.50–1.35)
GFR calc non Af Amer: 29 mL/min — ABNORMAL LOW (ref >90)
Sodium: 134 mEq/L — ABNORMAL LOW (ref 135–145)
Total Protein: 6.8 g/dL (ref 6.0–8.3)

## 2011-08-18 NOTE — Progress Notes (Signed)
CC:   [name], M.D. Jorja Loa, M.D. Jaclyn Prime. Greggory Stallion, M.D.  DIAGNOSES: 1. Kappa light chain multiple myeloma presenting here in August 2006     with massive proteinuria, renal failure, hypercalcemia, bone     involvement status post chemotherapy followed by bone marrow     transplant in May of 2007 at [facility] now with relapsing disease.     His initial therapy has now consisted of Decadron and Velcade.  He     is using Decadron 20 mg b.i.d. once a week, and the Velcade is per     protocol. 2. Recent bronchitis x2 treated with Levaquin approximately 2 weeks     ago and now a Z-Pak this past Friday having come to the emergency     room. 3. Trauma to his neck status post fusion of C4-T1 by neurosurgeon at     [facility] several years ago with some re operation recently Dr. Trey Sailors which was very successful in 2012. 4. History of hypertension. 5. Kayexalate usage in the past for mild hyperkalemia from his renal     insufficiency, not an issue presently. 6. Renal insufficiency. 7. Herpes zoster left shoulder in the past which resolved.  Schoeppner has not liked this medication as much.  He wanted to go back on the Thalomid, but we recommended the dexamethasone and the bortezomib. He tolerates it, I think, better than he thought, but he has had to come to the emergency room twice he states about a bronchitis.  He actually came once the emergency room that I can find for the bronchitis, once for back pain, and he went to see the PA of Dr. Regino Schultze for bronchitis a couple of weeks ago.  We do not have that note however, but it sounds like he was placed on Levaquin.  More recently, he was placed on a Z- Pak.  He is of course hypogammaglobulinemic, and if he does not get over this completely, we will have to give him IVIG, but he states today the green sputum has totally cleared.  He was having right-sided rib discomfort probably from coughing excessively.  His cough is much  better he states.  He does not have fever or chills and did not have them.  His kappa light chains have dropped drastically in 8 weeks. Unfortunately his platelets have not been in the normal range, but we have treated him on a couple of occasions with lower than 100,000 platelets and I think we will have to continue to do that.  PHYSICAL EXAMINATION:  Today, lungs are very clear.  No wheezes.  No rales.  No rhonchi.  He is afebrile.  Skin is warm and dry to the touch. Heart shows a regular rhythm and rate without murmur, rub, or gallop. He has no peripheral edema.  He is in no acute distress.  He actually looks good, but cushingoid.  Will get his labs today, see if we can reinitiate cycle 3 tomorrow.  I will discuss him with Dr. Greggory Stallion in the very near future.   ______________________________ Ladona Horns. Mariel Sleet, MD ESN/MEDQ  D:  08/18/2011  T:  08/18/2011  Job:  [id number]

## 2011-08-18 NOTE — Progress Notes (Signed)
[name] presented for labwork. Labs per MD order drawn via Peripheral Line 23 gauge needle inserted in right AC   Good blood return present. Procedure without incident.  Needle removed intact. Patient tolerated procedure well.

## 2011-08-18 NOTE — Progress Notes (Signed)
This office note has been dictated.

## 2011-08-18 NOTE — Patient Instructions (Signed)
[name]  [id number] August 14, 1945   [facility]  Discharge Instructions  RECOMMENDATIONS MADE BY THE CONSULTANT AND ANY TEST RESULTS WILL BE SENT TO YOUR REFERRING DOCTOR.   EXAM FINDINGS BY MD TODAY AND SIGNS AND SYMPTOMS TO REPORT TO CLINIC OR PRIMARY MD: you are responding to the chemotherapy.  Will check your blood work today and plan for treatment tomorrow.  MEDICATIONS PRESCRIBED: none   INSTRUCTIONS GIVEN AND DISCUSSED: Other :  Report fevers, chills, increased shortness of breath, etc.  SPECIAL INSTRUCTIONS/FOLLOW-UP: Return to Clinic tomorrow for chemotherapy if counts are ok and to see MD in 1 month.   I acknowledge that I have been informed and understand all the instructions given to me and received a copy. I do not have any more questions at this time, but understand that I may call the Specialty Clinic at [facility] at [phone] during business hours should I have any further questions or need assistance in obtaining follow-up care.    __________________________________________  _____________  __________ Signature of Patient or Authorized Representative            Date                   Time    __________________________________________ Nurse's Signature

## 2011-08-19 ENCOUNTER — Encounter (HOSPITAL_BASED_OUTPATIENT_CLINIC_OR_DEPARTMENT_OTHER): Payer: Medicare Other

## 2011-08-19 ENCOUNTER — Other Ambulatory Visit (HOSPITAL_COMMUNITY): Payer: Self-pay | Admitting: Oncology

## 2011-08-19 DIAGNOSIS — Z5112 Encounter for antineoplastic immunotherapy: Secondary | ICD-10-CM

## 2011-08-19 DIAGNOSIS — C9000 Multiple myeloma not having achieved remission: Secondary | ICD-10-CM

## 2011-08-19 MED ORDER — HEPARIN SOD (PORK) LOCK FLUSH 100 UNIT/ML IV SOLN
500.0000 [IU] | Freq: Once | INTRAVENOUS | Status: DC | PRN
  Filled 2011-08-19: qty 5

## 2011-08-19 MED ORDER — SODIUM CHLORIDE 0.9 % IV SOLN
Freq: Once | INTRAVENOUS | Status: AC
  Administered 2011-08-19: 250 mL via INTRAVENOUS

## 2011-08-19 MED ORDER — BORTEZOMIB CHEMO SQ INJECTION 3.5 MG (2.5MG/ML)
1.0000 mg/m2 | Freq: Once | INTRAMUSCULAR | Status: AC
  Administered 2011-08-19: 2.25 mg via SUBCUTANEOUS
  Filled 2011-08-19: qty 0.9

## 2011-08-19 MED ORDER — ONDANSETRON HCL 4 MG PO TABS
8.0000 mg | ORAL_TABLET | Freq: Once | ORAL | Status: AC
  Administered 2011-08-19: 8 mg via ORAL
  Filled 2011-08-19: qty 1

## 2011-08-19 MED ORDER — SODIUM CHLORIDE 0.9 % IJ SOLN
10.0000 mL | INTRAMUSCULAR | Status: DC | PRN
  Filled 2011-08-19: qty 10

## 2011-08-19 MED ORDER — ZOLEDRONIC ACID 4 MG/5ML IV CONC
2.0000 mg | Freq: Once | INTRAVENOUS | Status: AC
  Administered 2011-08-19: 2 mg via INTRAVENOUS
  Filled 2011-08-19: qty 2.5

## 2011-08-19 NOTE — Progress Notes (Signed)
[name] tolerated infusion well and without incident; also presented for injection.  See MAR for admin details; velcade admin to left lower abdomen.  No distress noted at time of discharge and patient was discharged home by himself.  Patient tolerated all procedures well and without incident.  No questions or complaints noted at this time.

## 2011-08-20 ENCOUNTER — Inpatient Hospital Stay (HOSPITAL_COMMUNITY): Payer: 59

## 2011-08-20 LAB — MULTIPLE MYELOMA PANEL, SERUM
Albumin ELP: 57.2 % (ref 55.8–66.1)
Alpha-1-Globulin: 7.6 % — ABNORMAL HIGH (ref 2.9–4.9)
Alpha-2-Globulin: 16.6 % — ABNORMAL HIGH (ref 7.1–11.8)
Beta 2: 3.8 % (ref 3.2–6.5)
Gamma Globulin: 9.6 % — ABNORMAL LOW (ref 11.1–18.8)
IgG (Immunoglobin G), Serum: 634 mg/dL — ABNORMAL LOW (ref 650–1600)
IgM, Serum: 138 mg/dL (ref 41–251)

## 2011-08-20 LAB — KAPPA/LAMBDA LIGHT CHAINS
Kappa free light chain: 14.8 mg/dL — ABNORMAL HIGH (ref 0.33–1.94)
Kappa, lambda light chain ratio: 7.47 — ABNORMAL HIGH (ref 0.26–1.65)

## 2011-08-21 ENCOUNTER — Encounter (HOSPITAL_BASED_OUTPATIENT_CLINIC_OR_DEPARTMENT_OTHER): Payer: Medicare Other

## 2011-08-21 DIAGNOSIS — Z5112 Encounter for antineoplastic immunotherapy: Secondary | ICD-10-CM

## 2011-08-21 DIAGNOSIS — C9000 Multiple myeloma not having achieved remission: Secondary | ICD-10-CM

## 2011-08-21 MED ORDER — BORTEZOMIB CHEMO SQ INJECTION 3.5 MG (2.5MG/ML)
1.0000 mg/m2 | Freq: Once | INTRAMUSCULAR | Status: AC
  Administered 2011-08-21: 2.25 mg via SUBCUTANEOUS
  Filled 2011-08-21: qty 0.9

## 2011-08-21 MED ORDER — ONDANSETRON HCL 4 MG PO TABS
8.0000 mg | ORAL_TABLET | Freq: Once | ORAL | Status: AC
  Administered 2011-08-21: 8 mg via ORAL
  Filled 2011-08-21: qty 2

## 2011-08-25 ENCOUNTER — Encounter (HOSPITAL_BASED_OUTPATIENT_CLINIC_OR_DEPARTMENT_OTHER): Payer: Medicare Other | Admitting: Oncology

## 2011-08-25 ENCOUNTER — Encounter (HOSPITAL_BASED_OUTPATIENT_CLINIC_OR_DEPARTMENT_OTHER): Payer: Medicare Other

## 2011-08-25 ENCOUNTER — Encounter (HOSPITAL_COMMUNITY): Payer: Self-pay | Admitting: Oncology

## 2011-08-25 DIAGNOSIS — Z5112 Encounter for antineoplastic immunotherapy: Secondary | ICD-10-CM

## 2011-08-25 DIAGNOSIS — C9000 Multiple myeloma not having achieved remission: Secondary | ICD-10-CM

## 2011-08-25 DIAGNOSIS — N289 Disorder of kidney and ureter, unspecified: Secondary | ICD-10-CM

## 2011-08-25 MED ORDER — ONDANSETRON HCL 4 MG PO TABS
8.0000 mg | ORAL_TABLET | Freq: Once | ORAL | Status: AC
  Administered 2011-08-25: 8 mg via ORAL
  Filled 2011-08-25: qty 2

## 2011-08-25 MED ORDER — BORTEZOMIB CHEMO SQ INJECTION 3.5 MG (2.5MG/ML)
1.0000 mg/m2 | Freq: Once | INTRAMUSCULAR | Status: AC
  Administered 2011-08-25: 2.25 mg via SUBCUTANEOUS
  Filled 2011-08-25: qty 0.9

## 2011-08-25 NOTE — Progress Notes (Signed)
CC:   [name], M.D. Jorja Loa, M.D. Jaclyn Prime. Greggory Stallion, M.D.  DIAGNOSES: 1. 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  His initial therapy was Decadron and Velcade.     He is using the Decadron 20 mg b.i.d. once a week, and the Velcade     is per protocol every 21 days as a cycle. 2. Recent bronchitis x2 which has resolved. 3. Trauma to his neck status post fusion C4-T1 by neurosurgeon at     [facility] several years ago with reoperation by Dr. Trey Sailors in [place]     that was very successful and was done in 2012. 4. History of hypertension. 5. Kayexalate usage in the past for mild hyperkalemia which right now     is not an issue. 6. Renal insufficiency with a stable creatinine presently running     2.26. 7. Herpes zoster left shoulder in the past which has resolved.  Deloyd is ongoing with his regimen.  He is on cycle 3 now.  He is due for day 11.  He receives Velcade 1 mg per meter squared day 1, 4, 8 and 11. We reduced this dose from 1.3 mg per meter squared because of toxicity.  He has really almost no complaints today.  His vital signs are very stable.  His face is somewhat cushingoid but his labs are tremendously better still.  His kappa light chains are lower than ever.  They are down to 14.80 mg per dL.  His kappa to lambda ratio is down to 7.47. When we started this regimen his kappa light chains were 306 mg per dL. His kappa to lambda light chain ratio was 680.  His renal function has mildly improved, as I mentioned above.  His potassium has remained very stable in the 4.9 to 5.2 range.  His albumin is quite good at 2.9 g per dL.  His sugars occasionally are high at 135- 160, most of the time it is right around 110.  So he  continues to improve.  He is due to start cycle 4 soon and I will get him an appointment Dr. Greggory Stallion in about 3-4 weeks for followup and consideration of second transplant.    ______________________________ Ladona Horns. Mariel Sleet, MD ESN/MEDQ  D:  08/25/2011  T:  08/25/2011  Job:  [id number]

## 2011-08-25 NOTE — Progress Notes (Signed)
This office note has been dictated.

## 2011-08-28 ENCOUNTER — Encounter (HOSPITAL_BASED_OUTPATIENT_CLINIC_OR_DEPARTMENT_OTHER): Payer: Medicare Other

## 2011-08-28 DIAGNOSIS — Z5112 Encounter for antineoplastic immunotherapy: Secondary | ICD-10-CM

## 2011-08-28 DIAGNOSIS — C9000 Multiple myeloma not having achieved remission: Secondary | ICD-10-CM

## 2011-08-28 MED ORDER — ONDANSETRON HCL 4 MG PO TABS
8.0000 mg | ORAL_TABLET | Freq: Once | ORAL | Status: AC
  Administered 2011-08-28: 8 mg via ORAL
  Filled 2011-08-28: qty 2

## 2011-08-28 MED ORDER — BORTEZOMIB CHEMO SQ INJECTION 3.5 MG (2.5MG/ML)
1.0000 mg/m2 | Freq: Once | INTRAMUSCULAR | Status: AC
  Administered 2011-08-28: 2.25 mg via SUBCUTANEOUS
  Filled 2011-08-28: qty 0.9

## 2011-08-28 NOTE — Progress Notes (Signed)
[name] presents today for injection per MD orders. velcade 2.25 mg administered SQ in right Abdomen. Administration without incident. Patient tolerated well.  

## 2011-09-01 ENCOUNTER — Ambulatory Visit (HOSPITAL_COMMUNITY): Payer: 59 | Admitting: Oncology

## 2011-09-01 ENCOUNTER — Telehealth (HOSPITAL_COMMUNITY): Payer: Self-pay | Admitting: Dietician

## 2011-09-01 NOTE — Telephone Encounter (Signed)
Pt reports he is a patient at [facility], where he is currently receiving chemotherapy. He reports he is doing well and has an upcoming appointment at [facility] to evaluate the necessity for a bone marrow transplant. He reports he has been following a low potassium diet, as he had blood cancer that resulted in a bone marrow transplant in 2006, which he reports "shut down my kidneys". He inquired about potassium contents of certain foods, such as dairy products, ice cream, and chocolate syrup. He does not understand why potassium content is not always on food labels. Reviewed high and low potassium foods. Also encouraged pt to call food manufacturer if he has additional questions about the nutrition content and ingredients on the foods that are not on the food label.  Pt also asked about low calorie beverages he can drink besides water. Suggested low calorie drink mixes, diet soda, diet tea, or water with fruit slices. A total of 20 minutes was spent with pt on phone answering his multiple diet related questions. All questions were answered to pt's satisfaction.

## 2011-09-07 ENCOUNTER — Encounter (HOSPITAL_BASED_OUTPATIENT_CLINIC_OR_DEPARTMENT_OTHER): Payer: Medicare Other

## 2011-09-07 DIAGNOSIS — C9000 Multiple myeloma not having achieved remission: Secondary | ICD-10-CM

## 2011-09-07 DIAGNOSIS — Z5112 Encounter for antineoplastic immunotherapy: Secondary | ICD-10-CM

## 2011-09-07 LAB — DIFFERENTIAL
Eosinophils Absolute: 0.1 10*3/uL (ref 0.0–0.7)
Lymphocytes Relative: 24 % (ref 12–46)
Lymphs Abs: 1.3 10*3/uL (ref 0.7–4.0)
Neutro Abs: 3.6 10*3/uL (ref 1.7–7.7)
Neutrophils Relative %: 67 % (ref 43–77)

## 2011-09-07 LAB — COMPREHENSIVE METABOLIC PANEL
ALT: 24 U/L (ref 0–53)
Alkaline Phosphatase: 50 U/L (ref 39–117)
CO2: 25 mEq/L (ref 19–32)
Chloride: 103 mEq/L (ref 96–112)
GFR calc Af Amer: 32 mL/min — ABNORMAL LOW (ref >90)
Glucose, Bld: 102 mg/dL — ABNORMAL HIGH (ref 70–99)
Potassium: 4.7 mEq/L (ref 3.5–5.1)
Sodium: 137 mEq/L (ref 135–145)
Total Bilirubin: 0.5 mg/dL (ref 0.3–1.2)
Total Protein: 6.2 g/dL (ref 6.0–8.3)

## 2011-09-07 LAB — CBC
Hemoglobin: 11.2 g/dL — ABNORMAL LOW (ref 13.0–17.0)
Platelets: 67 10*3/uL — ABNORMAL LOW (ref 150–400)
RBC: 3.33 MIL/uL — ABNORMAL LOW (ref 4.22–5.81)
WBC: 5.3 10*3/uL (ref 4.0–10.5)

## 2011-09-07 MED ORDER — BORTEZOMIB CHEMO SQ INJECTION 3.5 MG (2.5MG/ML)
1.0000 mg/m2 | Freq: Once | INTRAMUSCULAR | Status: AC
  Administered 2011-09-07: 2.25 mg via SUBCUTANEOUS
  Filled 2011-09-07: qty 0.9

## 2011-09-07 MED ORDER — ONDANSETRON HCL 4 MG PO TABS
ORAL_TABLET | ORAL | Status: AC
  Filled 2011-09-07: qty 2

## 2011-09-07 MED ORDER — ONDANSETRON HCL 4 MG PO TABS
8.0000 mg | ORAL_TABLET | Freq: Once | ORAL | Status: AC
  Administered 2011-09-07: 8 mg via ORAL

## 2011-09-07 NOTE — Progress Notes (Signed)
Labs drawn today for mm panel,kllc,cbc/diff,cmp 

## 2011-09-07 NOTE — Progress Notes (Signed)
Discussed platelet count of 67,000 with Dr. Mariel Sleet and instructed to go ahead with treatment.

## 2011-09-07 NOTE — Progress Notes (Signed)
[name] presents today for injection per MD orders. velcade 2.25 mg administered SQ in left Abdomen. Administration without incident. Patient tolerated well.

## 2011-09-08 ENCOUNTER — Inpatient Hospital Stay (HOSPITAL_COMMUNITY): Payer: Medicare Other

## 2011-09-08 LAB — KAPPA/LAMBDA LIGHT CHAINS
Kappa free light chain: 4.77 mg/dL — ABNORMAL HIGH (ref 0.33–1.94)
Kappa, lambda light chain ratio: 4.15 — ABNORMAL HIGH (ref 0.26–1.65)
Lambda free light chains: 1.15 mg/dL (ref 0.57–2.63)

## 2011-09-09 LAB — MULTIPLE MYELOMA PANEL, SERUM
Alpha-2-Globulin: 12.3 % — ABNORMAL HIGH (ref 7.1–11.8)
Beta Globulin: 6.1 % (ref 4.7–7.2)
Gamma Globulin: 7.9 % — ABNORMAL LOW (ref 11.1–18.8)
M-Spike, %: NOT DETECTED g/dL

## 2011-09-10 ENCOUNTER — Encounter (HOSPITAL_BASED_OUTPATIENT_CLINIC_OR_DEPARTMENT_OTHER): Payer: Medicare Other

## 2011-09-10 ENCOUNTER — Other Ambulatory Visit (HOSPITAL_COMMUNITY): Payer: Self-pay | Admitting: Oncology

## 2011-09-10 DIAGNOSIS — Z5112 Encounter for antineoplastic immunotherapy: Secondary | ICD-10-CM

## 2011-09-10 DIAGNOSIS — C9000 Multiple myeloma not having achieved remission: Secondary | ICD-10-CM

## 2011-09-10 LAB — CBC
HCT: 33.1 % — ABNORMAL LOW (ref 39.0–52.0)
MCHC: 32.9 g/dL (ref 30.0–36.0)
RDW: 14.8 % (ref 11.5–15.5)

## 2011-09-10 MED ORDER — ONDANSETRON HCL 4 MG PO TABS
8.0000 mg | ORAL_TABLET | Freq: Once | ORAL | Status: AC
  Administered 2011-09-10: 8 mg via ORAL

## 2011-09-10 MED ORDER — BORTEZOMIB CHEMO SQ INJECTION 3.5 MG (2.5MG/ML)
1.0000 mg/m2 | Freq: Once | INTRAMUSCULAR | Status: AC
  Administered 2011-09-10: 2.25 mg via SUBCUTANEOUS
  Filled 2011-09-10: qty 0.9

## 2011-09-10 MED ORDER — ONDANSETRON HCL 4 MG PO TABS
ORAL_TABLET | ORAL | Status: AC
  Filled 2011-09-10: qty 2

## 2011-09-10 NOTE — Progress Notes (Signed)
[name] presents today for injection per MD orders.  velcade 2.25 mg administered SQ in right Abdomen. Administration without incident. Patient tolerated well.

## 2011-09-14 ENCOUNTER — Encounter (HOSPITAL_COMMUNITY): Payer: Medicare Other

## 2011-09-14 LAB — DIFFERENTIAL
Basophils Relative: 0 % (ref 0–1)
Eosinophils Absolute: 0 10*3/uL (ref 0.0–0.7)
Lymphs Abs: 1.6 10*3/uL (ref 0.7–4.0)
Neutrophils Relative %: 60 % (ref 43–77)

## 2011-09-14 LAB — CBC
HCT: 32.4 % — ABNORMAL LOW (ref 39.0–52.0)
Hemoglobin: 11.1 g/dL — ABNORMAL LOW (ref 13.0–17.0)
MCH: 34.5 pg — ABNORMAL HIGH (ref 26.0–34.0)
MCHC: 34.3 g/dL (ref 30.0–36.0)
MCV: 100.6 fL — ABNORMAL HIGH (ref 78.0–100.0)
Platelets: 45 10*3/uL — ABNORMAL LOW (ref 150–400)
RBC: 3.22 MIL/uL — ABNORMAL LOW (ref 4.22–5.81)
RDW: 14.6 % (ref 11.5–15.5)
WBC: 5.2 10*3/uL (ref 4.0–10.5)

## 2011-09-14 NOTE — Progress Notes (Signed)
Chemo deferred today due to low plt count. R/S for next week.

## 2011-09-17 ENCOUNTER — Encounter (HOSPITAL_COMMUNITY): Payer: PRIVATE HEALTH INSURANCE | Attending: Oncology

## 2011-09-17 DIAGNOSIS — Z5112 Encounter for antineoplastic immunotherapy: Secondary | ICD-10-CM

## 2011-09-17 DIAGNOSIS — C9000 Multiple myeloma not having achieved remission: Secondary | ICD-10-CM

## 2011-09-17 MED ORDER — BORTEZOMIB CHEMO SQ INJECTION 3.5 MG (2.5MG/ML)
1.0000 mg/m2 | Freq: Once | INTRAMUSCULAR | Status: AC
  Administered 2011-09-17: 2.25 mg via SUBCUTANEOUS
  Filled 2011-09-17: qty 0.9

## 2011-09-17 MED ORDER — SODIUM CHLORIDE 0.9 % IV SOLN
Freq: Once | INTRAVENOUS | Status: AC
  Administered 2011-09-17: 11:00:00 via INTRAVENOUS

## 2011-09-17 MED ORDER — HEPARIN SOD (PORK) LOCK FLUSH 100 UNIT/ML IV SOLN
500.0000 [IU] | Freq: Once | INTRAVENOUS | Status: DC | PRN
  Filled 2011-09-17: qty 5

## 2011-09-17 MED ORDER — ONDANSETRON HCL 4 MG PO TABS
8.0000 mg | ORAL_TABLET | Freq: Once | ORAL | Status: AC
  Administered 2011-09-17: 8 mg via ORAL

## 2011-09-17 MED ORDER — ZOLEDRONIC ACID 4 MG/5ML IV CONC
2.0000 mg | Freq: Once | INTRAVENOUS | Status: AC
  Administered 2011-09-17: 2 mg via INTRAVENOUS
  Filled 2011-09-17: qty 2.5

## 2011-09-17 MED ORDER — ONDANSETRON HCL 4 MG PO TABS
ORAL_TABLET | ORAL | Status: AC
  Filled 2011-09-17: qty 2

## 2011-09-18 NOTE — Progress Notes (Signed)
Labs drawn

## 2011-09-21 ENCOUNTER — Other Ambulatory Visit (HOSPITAL_COMMUNITY): Payer: Self-pay | Admitting: Oncology

## 2011-09-21 ENCOUNTER — Encounter (HOSPITAL_BASED_OUTPATIENT_CLINIC_OR_DEPARTMENT_OTHER): Payer: PRIVATE HEALTH INSURANCE

## 2011-09-21 DIAGNOSIS — Z5112 Encounter for antineoplastic immunotherapy: Secondary | ICD-10-CM

## 2011-09-21 DIAGNOSIS — C9000 Multiple myeloma not having achieved remission: Secondary | ICD-10-CM

## 2011-09-21 LAB — CBC
MCH: 33.8 pg (ref 26.0–34.0)
MCHC: 33.5 g/dL (ref 30.0–36.0)
RDW: 15.3 % (ref 11.5–15.5)

## 2011-09-21 MED ORDER — ONDANSETRON HCL 4 MG PO TABS
ORAL_TABLET | ORAL | Status: AC
  Filled 2011-09-21: qty 2

## 2011-09-21 MED ORDER — BORTEZOMIB CHEMO SQ INJECTION 3.5 MG (2.5MG/ML)
1.0000 mg/m2 | Freq: Once | INTRAMUSCULAR | Status: AC
  Administered 2011-09-21: 2.25 mg via SUBCUTANEOUS
  Filled 2011-09-21: qty 0.9

## 2011-09-21 MED ORDER — ONDANSETRON HCL 4 MG PO TABS
8.0000 mg | ORAL_TABLET | Freq: Once | ORAL | Status: AC
  Administered 2011-09-21: 8 mg via ORAL

## 2011-09-21 NOTE — Progress Notes (Signed)
[name] presents today for injection per MD orders. velcade 2.25 mg administered SQ in right Abdomen. Administration without incident. Patient tolerated well.  

## 2011-09-28 ENCOUNTER — Encounter: Payer: Self-pay | Admitting: Oncology

## 2011-10-02 ENCOUNTER — Encounter (HOSPITAL_BASED_OUTPATIENT_CLINIC_OR_DEPARTMENT_OTHER): Payer: PRIVATE HEALTH INSURANCE

## 2011-10-02 DIAGNOSIS — C9000 Multiple myeloma not having achieved remission: Secondary | ICD-10-CM

## 2011-10-02 DIAGNOSIS — Z5112 Encounter for antineoplastic immunotherapy: Secondary | ICD-10-CM

## 2011-10-02 LAB — CBC
HCT: 33.4 % — ABNORMAL LOW (ref 39.0–52.0)
MCH: 34.5 pg — ABNORMAL HIGH (ref 26.0–34.0)
MCHC: 33.8 g/dL (ref 30.0–36.0)
MCV: 101.8 fL — ABNORMAL HIGH (ref 78.0–100.0)
Platelets: 72 10*3/uL — ABNORMAL LOW (ref 150–400)
RDW: 15.6 % — ABNORMAL HIGH (ref 11.5–15.5)
WBC: 7 10*3/uL (ref 4.0–10.5)

## 2011-10-02 LAB — BASIC METABOLIC PANEL
BUN: 61 mg/dL — ABNORMAL HIGH (ref 6–23)
Calcium: 9 mg/dL (ref 8.4–10.5)
Creatinine, Ser: 2.77 mg/dL — ABNORMAL HIGH (ref 0.50–1.35)
GFR calc non Af Amer: 23 mL/min — ABNORMAL LOW (ref >90)
Glucose, Bld: 97 mg/dL (ref 70–99)
Potassium: 4.6 mEq/L (ref 3.5–5.1)

## 2011-10-02 LAB — DIFFERENTIAL
Basophils Absolute: 0 10*3/uL (ref 0.0–0.1)
Basophils Relative: 0 % (ref 0–1)
Eosinophils Absolute: 0 10*3/uL (ref 0.0–0.7)
Eosinophils Relative: 1 % (ref 0–5)
Monocytes Absolute: 0.5 10*3/uL (ref 0.1–1.0)

## 2011-10-02 MED ORDER — BORTEZOMIB CHEMO SQ INJECTION 3.5 MG (2.5MG/ML)
1.0000 mg/m2 | Freq: Once | INTRAMUSCULAR | Status: AC
  Administered 2011-10-02: 2.25 mg via SUBCUTANEOUS
  Filled 2011-10-02: qty 0.9

## 2011-10-02 MED ORDER — ONDANSETRON HCL 4 MG PO TABS
8.0000 mg | ORAL_TABLET | Freq: Once | ORAL | Status: AC
  Administered 2011-10-02: 8 mg via ORAL

## 2011-10-02 MED ORDER — ONDANSETRON HCL 4 MG PO TABS
ORAL_TABLET | ORAL | Status: AC
  Filled 2011-10-02: qty 2

## 2011-10-02 NOTE — Progress Notes (Signed)
Labs drawn today for cbc/diff,kllc,bmp , mm panel

## 2011-10-02 NOTE — Progress Notes (Signed)
[name] presents today for injection per MD orders. Velcade 2.25mg  administered SQ in left Abdomen. Administration without incident. Patient tolerated well.

## 2011-10-04 LAB — KAPPA/LAMBDA LIGHT CHAINS: Kappa free light chain: 2.17 mg/dL — ABNORMAL HIGH (ref 0.33–1.94)

## 2011-10-05 ENCOUNTER — Encounter (HOSPITAL_BASED_OUTPATIENT_CLINIC_OR_DEPARTMENT_OTHER): Payer: PRIVATE HEALTH INSURANCE

## 2011-10-05 ENCOUNTER — Encounter (HOSPITAL_COMMUNITY): Payer: PRIVATE HEALTH INSURANCE

## 2011-10-05 DIAGNOSIS — C9000 Multiple myeloma not having achieved remission: Secondary | ICD-10-CM

## 2011-10-05 DIAGNOSIS — Z5112 Encounter for antineoplastic immunotherapy: Secondary | ICD-10-CM

## 2011-10-05 MED ORDER — ONDANSETRON HCL 4 MG PO TABS
ORAL_TABLET | ORAL | Status: AC
  Filled 2011-10-05: qty 1

## 2011-10-05 MED ORDER — BORTEZOMIB CHEMO SQ INJECTION 3.5 MG (2.5MG/ML)
1.0000 mg/m2 | Freq: Once | INTRAMUSCULAR | Status: AC
  Administered 2011-10-05: 2.25 mg via SUBCUTANEOUS
  Filled 2011-10-05: qty 0.9

## 2011-10-05 MED ORDER — ONDANSETRON HCL 4 MG PO TABS
8.0000 mg | ORAL_TABLET | Freq: Once | ORAL | Status: AC
  Administered 2011-10-05: 8 mg via ORAL

## 2011-10-05 NOTE — Progress Notes (Signed)
Tolerated Velcade subcutaneous well.

## 2011-10-07 LAB — MULTIPLE MYELOMA PANEL, SERUM
Alpha-2-Globulin: 11.5 % (ref 7.1–11.8)
Beta 2: 3.4 % (ref 3.2–6.5)
Beta Globulin: 6.3 % (ref 4.7–7.2)
IgA: 37 mg/dL — ABNORMAL LOW (ref 68–379)
IgM, Serum: 69 mg/dL (ref 41–251)
M-Spike, %: NOT DETECTED g/dL
Total Protein: 7.2 g/dL (ref 6.0–8.3)

## 2011-10-08 ENCOUNTER — Encounter (HOSPITAL_BASED_OUTPATIENT_CLINIC_OR_DEPARTMENT_OTHER): Payer: PRIVATE HEALTH INSURANCE | Admitting: Oncology

## 2011-10-08 DIAGNOSIS — R5383 Other fatigue: Secondary | ICD-10-CM

## 2011-10-08 DIAGNOSIS — R5381 Other malaise: Secondary | ICD-10-CM

## 2011-10-08 DIAGNOSIS — R52 Pain, unspecified: Secondary | ICD-10-CM

## 2011-10-08 DIAGNOSIS — C9000 Multiple myeloma not having achieved remission: Secondary | ICD-10-CM

## 2011-10-08 MED ORDER — MORPHINE SULFATE ER 15 MG PO TBCR: 15.0000 mg | EXTENDED_RELEASE_TABLET | Freq: Two times a day (BID) | ORAL | Status: DC

## 2011-10-08 NOTE — Patient Instructions (Signed)
[facility]  Discharge Instructions  RECOMMENDATIONS MADE BY THE CONSULTANT AND ANY TEST RESULTS WILL BE SENT TO YOUR REFERRING DOCTOR.   EXAM FINDINGS BY MD TODAY AND SIGNS AND SYMPTOMS TO REPORT TO CLINIC OR PRIMARY MD:   Please take your oxycodone that you already have at home for breakthrough pain.  You now have a prescription for MS Contin that you will take two times a day.  Please call us in 1 week if pain is not better.  Please return to see Dr. Greggory Stallion (we will schedule) for re-consultation  Return for labs and to see Dr. Mariel Sleet in 1 month. We will obtain labs several days before Dr. Mariel Sleet  I acknowledge that I have been informed and understand all the instructions given to me and received a copy. I do not have any more questions at this time, but understand that I may call the Specialty Clinic at [facility] at [phone] during business hours should I have any further questions or need assistance in obtaining follow-up care.    __________________________________________  _____________  __________ Signature of Patient or Authorized Representative            Date                   Time    __________________________________________ Nurse's Signature

## 2011-10-08 NOTE — Progress Notes (Signed)
Gregory Ruths, MD, MD [address] [place] [address]  1. Multiple myeloma     CURRENT THERAPY: Velcade every 21 days starting on 06/22/2011.  Now S/P day 4, cycle 5  INTERVAL HISTORY: [name] 66 y.o. 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 His initial therapy was Decadron and Velcade. He is using the Decadron 20 mg b.i.d. once a week, and the Velcade is per protocol every 21 days as a cycle.   I spent some time with the patient reviewing his lab work.  I personally reviewed and went over laboratory results with the patient.  His myeloma blood work continues to improve.  We then proceeded to discuss the conversation Dr. Mariel [name] had with Dr. Greggory [name] [facility]) regarding his myeloma treatment.  The options are as follow: 1. Re-consultation with Dr. Greggory [name] to discuss options including a bone marrow transplant, which may require low-dose treatment following transplantation. 2. Continue with life-long Multiple Myeloma therapy  3. Decrease the dose of Velcade, but sacrifice potential efficacy.  The patient's symptoms and complaints are typical of treatment and are not easily reversible.  His two major complaints is fatigue (secondary to chemotherapy) and pain (secondary to multiple neck surgeries).    The patient became emotional when we really started to discuss and weigh the options outlined above.  He was not pleased to hear that one of his options is life-long therapy.  This options must be weighed against his horrors of a bone marrow transplant.  The third options was discussed in great detail and he understands the risk of losing efficacy and potentially having to move on to another  therapy which means we have "less ammunition" against his multiple myeloma.  He understands that this disease is incurable with life-long treatment and may only be managed.  A bone marrow transplant may be his only option that may provide him a remission and the hope of no or decreased therapy doses.   I personally reviewed and went over laboratory results with the patient.  We had a long discussion regarding his pain.  He continues to have discomfort.  I therefore have provided him with a low-dose MS Contin for a long-acting narcotic.  I spent time discussiung the role of such a medication with bringing his pain level down.  He understands that this treatment alone will not likely rid him of pain.  The hope is that it will make it more manageable and he would be better controlled.  He will continue to have his Oxycodone pain medication at home for breakthrough pain management.  I provided the patient an illustration on the back of his lab work today regarding his new pain regimen.  He expresses understanding of this.  He is aware that it can be constipating and he is to utilize his current bowel regimen (MOM) to maintain his BMs.   Past Medical History  Diagnosis Date  . Hypertension   . Multiple myeloma   . Ruptured cervical disc   . Fall resulting in striking against other object     paralyzed  . Recurrent multiple myeloma of bone marrow with unknown EBV status     has ESSENTIAL HYPERTENSION, BENIGN; BRADYCARDIA; and Multiple myeloma on his problem  list.      has no known allergies.  [name] does not currently have medications on file.  Past Surgical History  Procedure Date  . Inguinal hernia repair   . Anterior cervical decomp/discectomy fusion   . Bone marrow aspirate and biopsy wiith lumbar puncture   . Melanoma excision     rt. breast  . Cervical spine surgery     Denies any headaches, dizziness, double vision, fevers, chills, night sweats, nausea, vomiting, diarrhea,  constipation, chest pain, heart palpitations, shortness of breath, blood in stool, black tarry stool, urinary pain, urinary burning, urinary frequency, hematuria.   PHYSICAL EXAMINATION  ECOG PERFORMANCE STATUS: 2 - Symptomatic, <50% confined to bed  Filed Vitals:   10/08/11 1433  BP: 124/82  Pulse: 82  Temp: 95.7 F (35.4 C)    GENERAL:alert, no distress, well nourished, well developed, comfortable, cooperative and smiling SKIN: skin color, texture, turgor are normal, no rashes or significant lesions HEAD: Normocephalic, No masses, lesions, tenderness or abnormalities EYES: normal, Conjunctiva are pink and non-injected EARS: External ears normal OROPHARYNX:lips, buccal mucosa, and tongue normal and mucous membranes are moist  NECK: supple, trachea midline LYMPH:  not examined BREAST:not examined LUNGS: clear to auscultation  HEART: regular rate & rhythm, no murmurs, no gallops, S1 normal and S2 normal ABDOMEN:abdomen soft, non-tender and normal bowel sounds BACK: Back symmetric, no curvature. EXTREMITIES:less then 2 second capillary refill, no joint deformities, effusion, or inflammation, no edema, no skin discoloration, no clubbing, no cyanosis  NEURO: alert & oriented x 3 with fluent speech, no focal motor/sensory deficits, gait normal    LABORATORY DATA: Results for [name], [name] (MRN [id number]) as of 10/08/2011 14:34  Ref. Range 10/02/2011 10:41 10/02/2011 10:42  Comment No range found (NOTE)   Sodium Latest Range: 135-145 mEq/L  137  Potassium Latest Range: 3.5-5.1 mEq/L  4.6  Chloride Latest Range: 96-112 mEq/L  105  CO2 Latest Range: 19-32 mEq/L  23  BUN Latest Range: 6-23 mg/dL  61 (H)  Creat Latest Range: 0.50-1.35 mg/dL  4.09 (H)  Calcium Latest Range: 8.4-10.5 mg/dL  9.0  GFR calc non Af Amer Latest Range: >90 mL/min  23 (L)  GFR calc Af Amer Latest Range: >90 mL/min  26 (L)  Glucose Latest Range: 70-99 mg/dL  97  Total Protein Latest Range: 6.0-8.3 g/dL 7.2    Kappa free light chain Latest Range: 0.33-1.94 mg/dL  8.11 (H)  Albumin ELP Latest Range: 55.8-66.1 % 66.6 (H)   Alpha-1-Globulin Latest Range: 2.9-4.9 % 5.3 (H)   Alpha-2-Globulin Latest Range: 7.1-11.8 % 11.5   Beta Globulin Latest Range: 4.7-7.2 % 6.3   Beta 2 Latest Range: 3.2-6.5 % 3.4   Gamma Globulin Latest Range: 11.1-18.8 % 6.9 (L)   M-SPIKE, % No range found NOT DETECTED   SPE Interp. No range found (NOTE)   IgG (Immunoglobin G), Serum Latest Range: [phone] mg/dL 914 (L)   IgA Latest Range: 68-379 mg/dL 37 (L)   IgM, Serum Latest Range: 41-251 mg/dL 69   Lamda free light chains Latest Range: 0.57-2.63 mg/dL  7.82  Kappa, lamda light chain ratio Latest Range: 0.26-1.65   3.19 (H)  WBC Latest Range: 4.0-10.5 K/uL  7.0  RBC Latest Range: 4.22-5.81 MIL/uL  3.28 (L)  Hemoglobin Latest Range: 13.0-17.0 g/dL  95.6 (L)  HCT Latest Range: 39.0-52.0 %  33.4 (L)  MCV Latest Range: 78.0-100.0 fL  101.8 (H)  MCH Latest Range: 26.0-34.0 pg  34.5 (H)  MCHC Latest Range:  30.0-36.0 g/dL  16.1  RDW Latest Range: 11.5-15.5 %  15.6 (H)  Platelets Latest Range: 150-400 K/uL  72 (L)  Neutrophils Relative Latest Range: 43-77 %  64  Lymphocytes Relative Latest Range: 12-46 %  28  Monocytes Relative Latest Range: 3-12 %  8  Eosinophils Relative Latest Range: 0-5 %  1  Basophils Relative Latest Range: 0-1 %  0  NEUT# Latest Range: 1.7-7.7 K/uL  4.4  Lymphocytes Absolute Latest Range: 0.7-4.0 K/uL  2.0  Monocytes Absolute Latest Range: 0.1-1.0 K/uL  0.5  Eosinophils Absolute Latest Range: 0.0-0.7 K/uL  0.0  Basophils Absolute Latest Range: 0.0-0.1 K/uL  0.0      ASSESSMENT:  1. 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 His initial therapy was Decadron and Velcade. He is  using the Decadron 20 mg b.i.d. once a week, and the Velcade is per protocol every 21 days as a cycle.  2. Recent bronchitis x2 which has resolved.  3. Trauma to his neck status post fusion C4-T1 by neurosurgeon at [facility] several years ago with reoperation by Dr. Trey Sailors in [place] that was very successful and was done in 2012.  4. History of hypertension.  5. Kayexalate usage in the past for mild hyperkalemia which right now is not an issue.  6. Renal insufficiency with a stable creatinine presently running 2.77.  7. Herpes zoster left shoulder in the past which has resolved.   PLAN:  1. I personally reviewed and went over laboratory results with the patient. 2. Offered him the four options discussed above in the interval history. 3. Will continue with Velcade as scheduled tomorrow.  4. Patient wishes to see Dr. Greggory [name].  Scheduler informed to get patient an appointment with Dr. Greggory [name]. 5. Long discussion about his options and potential outcomes.  6. Long discussion about his pain.   7. Illustrated the role of a long-acting narcotic.  He is agreeable to try MS contin.  Will start at low dose because the patient is concerned about constipation and being tired on the narcotic.  8. Prescription for MS Contin 15 mg every 12 hours #68. 9. Patient education regarding breakthrough pain management.  Patient has Oxycodone at home (according to the patient).  I illustrated the role of the long-acting narcotic and the short-acting Oxycodone in his pain management regimen.  10. Lab work in 1 months: MM panel, CBC diff, CMET 11. Return in 1 month for follow-up.   All questions were answered. The patient knows to call the clinic with any problems, questions or concerns. We can certainly see the patient much sooner if necessary.  The patient and plan discussed with [name], MD and he is in agreement with the aforementioned.  I spent 25 minutes counseling the patient face to face. The total time spent  in the appointment was 30 minutes.  Kalyn Dimattia

## 2011-10-09 ENCOUNTER — Encounter (HOSPITAL_BASED_OUTPATIENT_CLINIC_OR_DEPARTMENT_OTHER): Payer: PRIVATE HEALTH INSURANCE

## 2011-10-09 ENCOUNTER — Inpatient Hospital Stay (HOSPITAL_COMMUNITY): Payer: Medicare Other

## 2011-10-09 DIAGNOSIS — C9000 Multiple myeloma not having achieved remission: Secondary | ICD-10-CM

## 2011-10-09 LAB — CBC
HCT: 32.5 % — ABNORMAL LOW (ref 39.0–52.0)
Hemoglobin: 11 g/dL — ABNORMAL LOW (ref 13.0–17.0)
MCHC: 33.8 g/dL (ref 30.0–36.0)
RDW: 15.3 % (ref 11.5–15.5)
WBC: 5.6 10*3/uL (ref 4.0–10.5)

## 2011-10-09 LAB — COMPREHENSIVE METABOLIC PANEL
ALT: 18 U/L (ref 0–53)
AST: 17 U/L (ref 0–37)
Albumin: 3.3 g/dL — ABNORMAL LOW (ref 3.5–5.2)
Calcium: 8.9 mg/dL (ref 8.4–10.5)
Creatinine, Ser: 2.45 mg/dL — ABNORMAL HIGH (ref 0.50–1.35)
GFR calc non Af Amer: 26 mL/min — ABNORMAL LOW (ref >90)
Sodium: 136 mEq/L (ref 135–145)
Total Protein: 5.6 g/dL — ABNORMAL LOW (ref 6.0–8.3)

## 2011-10-09 LAB — DIFFERENTIAL
Basophils Absolute: 0 10*3/uL (ref 0.0–0.1)
Eosinophils Absolute: 0.1 10*3/uL (ref 0.0–0.7)
Lymphocytes Relative: 20 % (ref 12–46)
Monocytes Relative: 12 % (ref 3–12)
Neutro Abs: 3.7 10*3/uL (ref 1.7–7.7)
Neutrophils Relative %: 67 % (ref 43–77)
Smear Review: DECREASED

## 2011-10-09 NOTE — Progress Notes (Signed)
[name] presented for labwork. Labs per MD order drawn via Peripheral Line 23 gauge needle inserted in left AC  Good blood return present. Procedure without incident.  Needle removed intact. Patient tolerated procedure well.  Patient complains of feeling "weak as water" and not sure should take chemotherapy today.  Discussed with Dr. Mariel Sleet and patient to be rescheduled for next week.

## 2011-10-13 ENCOUNTER — Encounter (HOSPITAL_COMMUNITY): Payer: PRIVATE HEALTH INSURANCE

## 2011-10-13 ENCOUNTER — Encounter (HOSPITAL_BASED_OUTPATIENT_CLINIC_OR_DEPARTMENT_OTHER): Payer: PRIVATE HEALTH INSURANCE

## 2011-10-13 DIAGNOSIS — Z5112 Encounter for antineoplastic immunotherapy: Secondary | ICD-10-CM

## 2011-10-13 DIAGNOSIS — C9000 Multiple myeloma not having achieved remission: Secondary | ICD-10-CM

## 2011-10-13 LAB — CBC
HCT: 30.3 % — ABNORMAL LOW (ref 39.0–52.0)
Hemoglobin: 10.2 g/dL — ABNORMAL LOW (ref 13.0–17.0)
MCH: 34.3 pg — ABNORMAL HIGH (ref 26.0–34.0)
MCHC: 33.7 g/dL (ref 30.0–36.0)
MCV: 102 fL — ABNORMAL HIGH (ref 78.0–100.0)
RBC: 2.97 MIL/uL — ABNORMAL LOW (ref 4.22–5.81)

## 2011-10-13 LAB — DIFFERENTIAL
Eosinophils Absolute: 0.1 10*3/uL (ref 0.0–0.7)
Lymphs Abs: 1.4 10*3/uL (ref 0.7–4.0)
Monocytes Absolute: 0.3 10*3/uL (ref 0.1–1.0)
Monocytes Relative: 6 % (ref 3–12)
Neutrophils Relative %: 68 % (ref 43–77)

## 2011-10-13 MED ORDER — ONDANSETRON HCL 4 MG PO TABS
8.0000 mg | ORAL_TABLET | Freq: Once | ORAL | Status: AC
  Administered 2011-10-13: 8 mg via ORAL

## 2011-10-13 MED ORDER — BORTEZOMIB CHEMO SQ INJECTION 3.5 MG (2.5MG/ML)
1.0000 mg/m2 | Freq: Once | INTRAMUSCULAR | Status: AC
  Administered 2011-10-13: 2.25 mg via SUBCUTANEOUS
  Filled 2011-10-13: qty 0.9

## 2011-10-13 MED ORDER — ONDANSETRON HCL 4 MG PO TABS
ORAL_TABLET | ORAL | Status: AC
  Filled 2011-10-13: qty 2

## 2011-10-15 ENCOUNTER — Ambulatory Visit (HOSPITAL_COMMUNITY): Payer: Medicare Other

## 2011-10-16 ENCOUNTER — Other Ambulatory Visit (HOSPITAL_COMMUNITY): Payer: Self-pay | Admitting: Oncology

## 2011-10-16 ENCOUNTER — Ambulatory Visit (HOSPITAL_COMMUNITY): Payer: Medicare Other

## 2011-10-16 ENCOUNTER — Encounter (HOSPITAL_BASED_OUTPATIENT_CLINIC_OR_DEPARTMENT_OTHER): Payer: PRIVATE HEALTH INSURANCE

## 2011-10-16 VITALS — BP 130/80 | HR 80 | Temp 98.0°F

## 2011-10-16 DIAGNOSIS — C9000 Multiple myeloma not having achieved remission: Secondary | ICD-10-CM

## 2011-10-16 MED ORDER — SODIUM CHLORIDE 0.9 % IV SOLN
INTRAVENOUS | Status: DC
  Administered 2011-10-16: 500 mL via INTRAVENOUS

## 2011-10-16 MED ORDER — SODIUM CHLORIDE 0.9 % IJ SOLN
10.0000 mL | INTRAMUSCULAR | Status: DC | PRN
  Administered 2011-10-16: 10 mL via INTRAVENOUS
  Filled 2011-10-16: qty 10

## 2011-10-16 MED ORDER — SODIUM CHLORIDE 0.9 % IJ SOLN
INTRAMUSCULAR | Status: AC
  Filled 2011-10-16: qty 10

## 2011-10-16 MED ORDER — ZOLEDRONIC ACID 4 MG/5ML IV CONC
4.0000 mg | Freq: Once | INTRAVENOUS | Status: AC
  Administered 2011-10-16: 4 mg via INTRAVENOUS
  Filled 2011-10-16: qty 5

## 2011-10-16 NOTE — Progress Notes (Signed)
Tolerated infusion well,

## 2011-10-28 ENCOUNTER — Encounter (HOSPITAL_COMMUNITY): Payer: Self-pay | Admitting: Oncology

## 2011-10-28 ENCOUNTER — Other Ambulatory Visit (HOSPITAL_COMMUNITY): Payer: Self-pay | Admitting: Oncology

## 2011-10-28 DIAGNOSIS — C9000 Multiple myeloma not having achieved remission: Secondary | ICD-10-CM

## 2011-10-30 ENCOUNTER — Encounter (HOSPITAL_COMMUNITY): Payer: PRIVATE HEALTH INSURANCE | Attending: Oncology

## 2011-10-30 ENCOUNTER — Encounter (HOSPITAL_BASED_OUTPATIENT_CLINIC_OR_DEPARTMENT_OTHER): Payer: PRIVATE HEALTH INSURANCE

## 2011-10-30 DIAGNOSIS — C9000 Multiple myeloma not having achieved remission: Secondary | ICD-10-CM

## 2011-10-30 MED ORDER — FILGRASTIM 300 MCG/ML IJ SOLN
Freq: Once | INTRAMUSCULAR | Status: AC
  Administered 2011-10-30: 16:00:00 via SUBCUTANEOUS
  Filled 2011-10-30: qty 1.6

## 2011-10-30 MED ORDER — FILGRASTIM 300 MCG/0.5ML IJ SOLN: 300.0000 ug | Freq: Once | INTRAMUSCULAR | Status: DC

## 2011-10-30 MED ORDER — SODIUM CHLORIDE 0.9 % IJ SOLN
INTRAMUSCULAR | Status: AC
  Filled 2011-10-30: qty 20

## 2011-10-30 MED ORDER — HEPARIN SOD (PORK) LOCK FLUSH 100 UNIT/ML IV SOLN
INTRAVENOUS | Status: AC
  Filled 2011-10-30: qty 10

## 2011-10-30 MED ORDER — HEPARIN SOD (PORK) LOCK FLUSH 100 UNIT/ML IV SOLN
500.0000 [IU] | Freq: Once | INTRAVENOUS | Status: AC
  Administered 2011-10-30: 500 [IU] via INTRAVENOUS
  Filled 2011-10-30: qty 5

## 2011-10-30 MED ORDER — SODIUM CHLORIDE 0.9 % IJ SOLN
10.0000 mL | INTRAMUSCULAR | Status: DC | PRN
  Administered 2011-10-30: 10 mL via INTRAVENOUS
  Filled 2011-10-30: qty 10

## 2011-10-30 NOTE — Progress Notes (Signed)
Tolerated injection well. Also did well with hickman flush.

## 2011-10-31 ENCOUNTER — Encounter (HOSPITAL_BASED_OUTPATIENT_CLINIC_OR_DEPARTMENT_OTHER): Payer: PRIVATE HEALTH INSURANCE

## 2011-10-31 VITALS — BP 135/78 | HR 89

## 2011-10-31 DIAGNOSIS — C9000 Multiple myeloma not having achieved remission: Secondary | ICD-10-CM

## 2011-10-31 MED ORDER — FILGRASTIM 480 MCG/1.6ML IJ SOLN
Freq: Once | INTRAMUSCULAR | Status: AC
  Administered 2011-10-31: 11:00:00 via SUBCUTANEOUS
  Filled 2011-10-31: qty 2

## 2011-10-31 NOTE — Progress Notes (Signed)
[name] presents today for injection per MD orders. Neupogen administered SQ in left and right Upper Arm in divided doses.. Administration without incident. Patient tolerated well.

## 2011-11-02 ENCOUNTER — Other Ambulatory Visit (HOSPITAL_COMMUNITY): Payer: Medicare Other

## 2011-11-05 ENCOUNTER — Encounter (HOSPITAL_BASED_OUTPATIENT_CLINIC_OR_DEPARTMENT_OTHER): Payer: PRIVATE HEALTH INSURANCE

## 2011-11-05 ENCOUNTER — Telehealth (HOSPITAL_COMMUNITY): Payer: Self-pay | Admitting: *Deleted

## 2011-11-05 DIAGNOSIS — C9000 Multiple myeloma not having achieved remission: Secondary | ICD-10-CM

## 2011-11-05 DIAGNOSIS — Z452 Encounter for adjustment and management of vascular access device: Secondary | ICD-10-CM

## 2011-11-05 MED ORDER — SODIUM CHLORIDE 0.9 % IJ SOLN
10.0000 mL | INTRAMUSCULAR | Status: DC | PRN
  Administered 2011-11-05: 10 mL via INTRAVENOUS
  Filled 2011-11-05: qty 10

## 2011-11-05 MED ORDER — HEPARIN SOD (PORK) LOCK FLUSH 100 UNIT/ML IV SOLN
500.0000 [IU] | Freq: Once | INTRAVENOUS | Status: AC
  Administered 2011-11-05: 500 [IU] via INTRAVENOUS
  Filled 2011-11-05: qty 5

## 2011-11-05 MED ORDER — HEPARIN SOD (PORK) LOCK FLUSH 100 UNIT/ML IV SOLN
INTRAVENOUS | Status: AC
  Filled 2011-11-05: qty 10

## 2011-11-05 MED ORDER — SODIUM CHLORIDE 0.9 % IJ SOLN
INTRAMUSCULAR | Status: AC
  Filled 2011-11-05: qty 20

## 2011-11-05 NOTE — Progress Notes (Signed)
[name] presented forhickman line flush and dressing changed.  hickman line located rt chest . Good blood return present. hickman line flushed with 20ml NS and 250 U/ 2.5 ml Heparin each lumen. Procedure without incident. Dressing changed using sterile technique Patient tolerated procedure well.

## 2011-11-12 ENCOUNTER — Encounter (HOSPITAL_COMMUNITY): Payer: Medicare Other

## 2011-11-13 ENCOUNTER — Ambulatory Visit (HOSPITAL_COMMUNITY): Payer: Medicare Other

## 2011-11-13 ENCOUNTER — Encounter (HOSPITAL_BASED_OUTPATIENT_CLINIC_OR_DEPARTMENT_OTHER): Payer: PRIVATE HEALTH INSURANCE

## 2011-11-13 DIAGNOSIS — C9000 Multiple myeloma not having achieved remission: Secondary | ICD-10-CM

## 2011-11-13 LAB — BASIC METABOLIC PANEL
CO2: 20 mEq/L (ref 19–32)
Calcium: 8.5 mg/dL (ref 8.4–10.5)
Creatinine, Ser: 2.88 mg/dL — ABNORMAL HIGH (ref 0.50–1.35)
Glucose, Bld: 101 mg/dL — ABNORMAL HIGH (ref 70–99)
Sodium: 135 mEq/L (ref 135–145)

## 2011-11-13 MED ORDER — SODIUM CHLORIDE 0.9 % IV SOLN
60.0000 mg | Freq: Once | INTRAVENOUS | Status: AC
  Administered 2011-11-13: 60 mg via INTRAVENOUS
  Filled 2011-11-13: qty 500

## 2011-11-13 MED ORDER — HEPARIN SOD (PORK) LOCK FLUSH 100 UNIT/ML IV SOLN
INTRAVENOUS | Status: AC
  Filled 2011-11-13: qty 10

## 2011-11-13 MED ORDER — SODIUM CHLORIDE 0.9 % IJ SOLN
INTRAMUSCULAR | Status: AC
  Filled 2011-11-13: qty 10

## 2011-11-13 MED ORDER — SODIUM CHLORIDE 0.9 % IJ SOLN
INTRAMUSCULAR | Status: AC
  Filled 2011-11-13: qty 20

## 2011-11-13 NOTE — Progress Notes (Signed)
Infusion complete, patient tolerated well.  Dressing changed to central line, both lumens flushed as well.

## 2011-11-16 ENCOUNTER — Ambulatory Visit (HOSPITAL_COMMUNITY): Payer: Medicare Other | Admitting: Oncology

## 2011-11-18 ENCOUNTER — Encounter (HOSPITAL_COMMUNITY): Payer: PRIVATE HEALTH INSURANCE | Admitting: Oncology

## 2011-11-18 ENCOUNTER — Ambulatory Visit (HOSPITAL_COMMUNITY): Payer: Medicare Other

## 2011-11-18 ENCOUNTER — Encounter (HOSPITAL_COMMUNITY): Payer: PRIVATE HEALTH INSURANCE | Attending: Oncology

## 2011-11-18 DIAGNOSIS — Z95828 Presence of other vascular implants and grafts: Secondary | ICD-10-CM

## 2011-11-18 DIAGNOSIS — C9000 Multiple myeloma not having achieved remission: Secondary | ICD-10-CM

## 2011-11-18 DIAGNOSIS — Z452 Encounter for adjustment and management of vascular access device: Secondary | ICD-10-CM

## 2011-11-18 MED ORDER — SODIUM CHLORIDE 0.9 % IJ SOLN
10.0000 mL | INTRAMUSCULAR | Status: DC | PRN
  Filled 2011-11-18: qty 10

## 2011-11-18 MED ORDER — HEPARIN SOD (PORK) LOCK FLUSH 100 UNIT/ML IV SOLN
INTRAVENOUS | Status: AC
  Filled 2011-11-18: qty 5

## 2011-11-18 MED ORDER — HEPARIN SOD (PORK) LOCK FLUSH 100 UNIT/ML IV SOLN
500.0000 [IU] | Freq: Once | INTRAVENOUS | Status: DC
  Filled 2011-11-18: qty 5

## 2011-11-18 NOTE — Progress Notes (Signed)
[name] presented for hickman flush line flush. Good blood return present. Hickman flushed with 20ml NS and 250U/2.12ml Heparin. Dressing changed and area cleaned. Procedure without incident. Patient tolerated procedure well.

## 2011-12-11 ENCOUNTER — Encounter: Payer: Self-pay | Admitting: Oncology

## 2011-12-17 ENCOUNTER — Encounter (HOSPITAL_COMMUNITY): Payer: PRIVATE HEALTH INSURANCE

## 2011-12-17 ENCOUNTER — Telehealth (HOSPITAL_COMMUNITY): Payer: Self-pay | Admitting: *Deleted

## 2011-12-17 ENCOUNTER — Encounter (HOSPITAL_COMMUNITY): Payer: PRIVATE HEALTH INSURANCE | Attending: Oncology | Admitting: Oncology

## 2011-12-17 VITALS — BP 111/75 | HR 89 | Temp 98.6°F

## 2011-12-17 DIAGNOSIS — D649 Anemia, unspecified: Secondary | ICD-10-CM | POA: Insufficient documentation

## 2011-12-17 DIAGNOSIS — C9000 Multiple myeloma not having achieved remission: Secondary | ICD-10-CM | POA: Insufficient documentation

## 2011-12-17 LAB — COMPREHENSIVE METABOLIC PANEL
AST: 31 U/L (ref 0–37)
Albumin: 2.8 g/dL — ABNORMAL LOW (ref 3.5–5.2)
BUN: 26 mg/dL — ABNORMAL HIGH (ref 6–23)
Chloride: 102 mEq/L (ref 96–112)
Creatinine, Ser: 3.29 mg/dL — ABNORMAL HIGH (ref 0.50–1.35)
Potassium: 4.6 mEq/L (ref 3.5–5.1)
Total Bilirubin: 0.3 mg/dL (ref 0.3–1.2)
Total Protein: 5.8 g/dL — ABNORMAL LOW (ref 6.0–8.3)

## 2011-12-17 LAB — CBC
HCT: 25.7 % — ABNORMAL LOW (ref 39.0–52.0)
Hemoglobin: 8.9 g/dL — ABNORMAL LOW (ref 13.0–17.0)
MCH: 34 pg (ref 26.0–34.0)
MCHC: 34.6 g/dL (ref 30.0–36.0)
MCV: 98.1 fL (ref 78.0–100.0)
Platelets: 53 10*3/uL — ABNORMAL LOW (ref 150–400)
RBC: 2.62 MIL/uL — ABNORMAL LOW (ref 4.22–5.81)
RDW: 15.6 % — ABNORMAL HIGH (ref 11.5–15.5)
WBC: 5.5 10*3/uL (ref 4.0–10.5)

## 2011-12-17 LAB — DIFFERENTIAL
Basophils Relative: 0 % (ref 0–1)
Eosinophils Absolute: 0 10*3/uL (ref 0.0–0.7)
Eosinophils Relative: 0 % (ref 0–5)
Lymphocytes Relative: 23 % (ref 12–46)
Neutro Abs: 3.3 10*3/uL (ref 1.7–7.7)
Neutrophils Relative %: 61 % (ref 43–77)

## 2011-12-17 MED ORDER — OXYCODONE HCL 5 MG PO TABS: ORAL_TABLET | ORAL | Status: DC

## 2011-12-17 NOTE — Progress Notes (Signed)
Labs drawn today for cbc/diff,cmp,mg 

## 2011-12-17 NOTE — Telephone Encounter (Signed)
Pt instructed to take Spironolactone daily and that there would be 14 pills. I will call patient back with instructions on Lasix after speaking with Tom.

## 2011-12-17 NOTE — Telephone Encounter (Signed)
Message copied by Oda Kilts on Thu Dec 17, 2011  3:08 PM ------      Message from: Ellouise Newer III      Created: Thu Dec 17, 2011 11:49 AM       Call patient (he maybe at his brother's) and see if he has any Lasix at home.  If he does, find out the dose.  See if he has any spironolactone at home too.  Again find out the dose.            If he has nothing please call in the following:            Lasix 20 mg PO M/W/F until edema resolves #30 with 0 refills      Spironolactone 100 mg daily until edema resolves #14, 0 refills

## 2011-12-17 NOTE — Patient Instructions (Addendum)
[facility]  Discharge Instructions  RECOMMENDATIONS MADE BY THE CONSULTANT AND ANY TEST RESULTS WILL BE SENT TO YOUR REFERRING DOCTOR.   EXAM FINDINGS BY MD TODAY AND SIGNS AND SYMPTOMS TO REPORT TO CLINIC OR PRIMARY MD:   SPECIAL INSTRUCTIONS/FOLLOW-UP: Return to Clinic on one month from December 28, 2011.  If this appointment interferes with [facility] then reschedule ours.  Please call us with any concerns.     I acknowledge that I have been informed and understand all the instructions given to me and received a copy. I do not have any more questions at this time, but understand that I may call the Specialty Clinic at [facility] at [phone] during business hours should I have any further questions or need assistance in obtaining follow-up care.    __________________________________________  _____________  __________ Signature of Patient or Authorized Representative            Date                   Time    __________________________________________ Nurse's Signature

## 2011-12-17 NOTE — Telephone Encounter (Signed)
Labs stable. Lasix 40mg  three times a week. Pt verbalized understanding.

## 2011-12-17 NOTE — Progress Notes (Signed)
[name], [name] [address] [place] [address]  1. 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 Greggory Stallion at [facility].  INTERVAL HISTORY: [name] 66 y.o. male returns for  regular  visit for followup of Kappa light chain multiple myeloma presenting here originally in August of 2006 with massive proteinuria; renal failure, hypercalcemia, bone involvement status post chemotherapy followed by bone marrow transplant in May of 2007 at [facility], now with recurrent disease. His initial therapy was Decadron and Velcade. He was using the Decadron 20 mg b.i.d. once a week, and the Velcade is per protocol every 21 days as a cycle. Unfortunately, [name] was displeased with the side effects of therapy and did not wish to be on lifelong therapy.  He was therefore sent for re-consultation with Dr. Greggory Stallion at [facility], re-considered for a second bone marrow transplantation, and he underwent this second bone marrow transplant.   He is here today for his first follow-up after this second bone marrow transplant.  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  He reports that the long acting pain medication is not effective for him and he requests going back to his oxycodone.  I have provided him an Oxycodone Rx for 5 mg tablets #100 and he may take 1-2 every 4 hours as needed.  He does have a bowel regimen consisting of MOM if constipation occurs.   His largest complaint is his edema.  He has 3+ pitting edema of LE.  He is on a low dose diuretic and we will find out what that is.  He should be on a low dose Lasix and  Spironolactone combination and we will develop that later today when his lab work is reported.   Otherwise, we will follow orders from [facility] and fax all lab work to them as well.  He was encouraged to walk as tolerated.     Past Medical History  Diagnosis Date  . Hypertension   . Multiple myeloma   . Ruptured cervical disc   . Fall resulting in striking against other object     paralyzed  . 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 Multiple myeloma 01/26/2011    has ESSENTIAL HYPERTENSION, BENIGN; BRADYCARDIA; and Multiple myeloma on his problem list.      has no known allergies.  [name] had no medications administered during this visit.  Past Surgical History  Procedure Date  . Inguinal hernia repair   . Anterior cervical decomp/discectomy fusion   . Bone marrow aspirate and biopsy wiith lumbar puncture   . Melanoma excision     rt. breast  . Cervical spine surgery     Denies any headaches, dizziness, double vision, fevers, chills, night sweats, nausea, vomiting, diarrhea, constipation, chest pain, heart palpitations, shortness of breath, blood in stool, black tarry stool, urinary pain, urinary burning, urinary frequency, hematuria.   PHYSICAL EXAMINATION  ECOG PERFORMANCE STATUS: 3 - Symptomatic, >50% confined to bed  Filed Vitals:   12/17/11 1018  BP: 111/75  Pulse: 89  Temp: 98.6 F (37 C)    GENERAL:alert, comfortable, cooperative, ill looking, smiling and cushingoid face. SKIN: skin color, texture, turgor are normal, no rashes  or significant lesions HEAD: Normocephalic, No masses, lesions, tenderness or abnormalities EYES: normal, Conjunctiva are pink and non-injected EARS: External ears normal OROPHARYNX:lips, buccal mucosa, and tongue normal and mucous membranes are moist  NECK: supple, trachea midline LYMPH:  not examined BREAST:not examined LUNGS: clear to auscultation and percussion HEART: regular rate & rhythm, no  murmurs, no gallops, S1 normal and S2 normal ABDOMEN:abdomen soft, non-tender, obese and normal bowel sounds BACK: Back symmetric, no curvature., No CVA tenderness EXTREMITIES:less then 2 second capillary refill, no joint deformities, effusion, or inflammation, no skin discoloration, no clubbing, no cyanosis, positive findings:  edema 3+ pitting LE edema.  NEURO: alert & oriented x 3 with fluent speech, no focal motor/sensory deficits   PENDING LABS: CBC diff, CMET, Mg   ASSESSMENT:  1. Kappa light chain multiple myeloma presenting here originally in August of 2006 with massive proteinuria; renal failure, hypercalcemia, bone involvement status post chemotherapy followed by bone marrow transplant in May of 2007 at [facility], now with recurrent disease. His initial therapy was Decadron and Velcade. He was using the Decadron 20 mg b.i.d. once a week, and the Velcade is per protocol every 21 days as a cycle. He is now S/P a second bone marrow transplant by Dr. Greggory Stallion at [facility]. 2. Trauma to his neck status post fusion C4-T1 by neurosurgeon at [facility] several years ago with reoperation by Dr. Trey Sailors in [place] that was very successful and was done in 2012.  3. History of hypertension.  4. Renal insufficiency, stable  7. Herpes zoster left shoulder in the past which has resolved.   PLAN:  1. Lab work per [facility] orders: CBC diff, CMET, MG on 8/5 and 12/24/2011. 2. Transfusion orders per [facility] as well.  Irradiated platelets for plt count less than 20 and 2 units PRBCs for Hgb less than 8.5 g/dL 3. Oxycodone 5 mg #100 provided today. 4. Will alter diuretic regimen pending lab results.    5. D/C Ms Contin per patient request.  6. Return in 6 weeks (1 month after being seen by Dr. Greggory Stallion) for follow-up.   All questions were answered. The patient knows to call the clinic with any problems, questions or concerns. We can certainly see the patient much sooner if necessary.  The patient and  plan discussed with [name], [name] and he is in agreement with the aforementioned.   Tanisha Lutes

## 2011-12-21 ENCOUNTER — Encounter (HOSPITAL_BASED_OUTPATIENT_CLINIC_OR_DEPARTMENT_OTHER): Payer: PRIVATE HEALTH INSURANCE

## 2011-12-21 DIAGNOSIS — C9000 Multiple myeloma not having achieved remission: Secondary | ICD-10-CM

## 2011-12-21 LAB — COMPREHENSIVE METABOLIC PANEL
AST: 29 U/L (ref 0–37)
Albumin: 2.9 g/dL — ABNORMAL LOW (ref 3.5–5.2)
Alkaline Phosphatase: 52 U/L (ref 39–117)
BUN: 25 mg/dL — ABNORMAL HIGH (ref 6–23)
Chloride: 103 mEq/L (ref 96–112)
Potassium: 4.6 mEq/L (ref 3.5–5.1)
Total Bilirubin: 0.2 mg/dL — ABNORMAL LOW (ref 0.3–1.2)

## 2011-12-21 LAB — CBC
Hemoglobin: 8.6 g/dL — ABNORMAL LOW (ref 13.0–17.0)
MCHC: 33.9 g/dL (ref 30.0–36.0)
WBC: 4.9 10*3/uL (ref 4.0–10.5)

## 2011-12-21 LAB — DIFFERENTIAL
Basophils Absolute: 0 10*3/uL (ref 0.0–0.1)
Basophils Relative: 0 % (ref 0–1)
Lymphocytes Relative: 24 % (ref 12–46)
Monocytes Relative: 18 % — ABNORMAL HIGH (ref 3–12)
Neutro Abs: 2.7 10*3/uL (ref 1.7–7.7)
Neutrophils Relative %: 55 % (ref 43–77)

## 2011-12-21 NOTE — Progress Notes (Signed)
Labs drawn today for cbc/diff,cmp,mg 

## 2011-12-24 ENCOUNTER — Other Ambulatory Visit (HOSPITAL_COMMUNITY): Payer: Self-pay | Admitting: Oncology

## 2011-12-24 ENCOUNTER — Encounter (HOSPITAL_BASED_OUTPATIENT_CLINIC_OR_DEPARTMENT_OTHER): Payer: PRIVATE HEALTH INSURANCE

## 2011-12-24 DIAGNOSIS — D649 Anemia, unspecified: Secondary | ICD-10-CM

## 2011-12-24 DIAGNOSIS — C9000 Multiple myeloma not having achieved remission: Secondary | ICD-10-CM

## 2011-12-24 LAB — COMPREHENSIVE METABOLIC PANEL
AST: 26 U/L (ref 0–37)
Albumin: 3.1 g/dL — ABNORMAL LOW (ref 3.5–5.2)
Calcium: 9.5 mg/dL (ref 8.4–10.5)
Creatinine, Ser: 3.43 mg/dL — ABNORMAL HIGH (ref 0.50–1.35)
GFR calc non Af Amer: 17 mL/min — ABNORMAL LOW (ref >90)

## 2011-12-24 LAB — DIFFERENTIAL
Basophils Absolute: 0 10*3/uL (ref 0.0–0.1)
Basophils Relative: 1 % (ref 0–1)
Eosinophils Relative: 4 % (ref 0–5)
Monocytes Absolute: 0.8 10*3/uL (ref 0.1–1.0)

## 2011-12-24 LAB — CBC
HCT: 24.7 % — ABNORMAL LOW (ref 39.0–52.0)
MCHC: 34.4 g/dL (ref 30.0–36.0)
MCV: 99.6 fL (ref 78.0–100.0)
RDW: 15.4 % (ref 11.5–15.5)

## 2011-12-24 MED ORDER — MAGNESIUM SULFATE 50 % IJ SOLN: 500.0000 mg | Freq: Once | INTRAVENOUS | Status: DC

## 2011-12-24 MED ORDER — MAGNESIUM SULFATE IN D5W 10-5 MG/ML-% IV SOLN
1.0000 g | Freq: Once | INTRAVENOUS | Status: DC
  Filled 2011-12-24: qty 100

## 2011-12-24 NOTE — Addendum Note (Signed)
Addended by: Edythe Lynn A on: 12/24/2011 02:58 PM   Modules accepted: Orders

## 2011-12-24 NOTE — Progress Notes (Signed)
Pt notified to come 0945 am for blood transfusion and IV magnesium

## 2011-12-24 NOTE — Progress Notes (Signed)
Labs drawn today for cbc/diff,cmp,mg 

## 2011-12-25 ENCOUNTER — Encounter (HOSPITAL_BASED_OUTPATIENT_CLINIC_OR_DEPARTMENT_OTHER): Payer: PRIVATE HEALTH INSURANCE

## 2011-12-25 VITALS — BP 138/81 | HR 74 | Temp 98.2°F | Resp 18

## 2011-12-25 DIAGNOSIS — R7989 Other specified abnormal findings of blood chemistry: Secondary | ICD-10-CM

## 2011-12-25 DIAGNOSIS — D649 Anemia, unspecified: Secondary | ICD-10-CM

## 2011-12-25 DIAGNOSIS — C9000 Multiple myeloma not having achieved remission: Secondary | ICD-10-CM

## 2011-12-25 MED ORDER — SODIUM CHLORIDE 0.9 % IV SOLN
250.0000 mL | Freq: Once | INTRAVENOUS | Status: AC
  Administered 2011-12-25: 250 mL via INTRAVENOUS

## 2011-12-25 MED ORDER — MAGNESIUM SULFATE IN D5W 10-5 MG/ML-% IV SOLN
1.0000 g | Freq: Once | INTRAVENOUS | Status: AC
  Administered 2011-12-25: 1 g via INTRAVENOUS
  Filled 2011-12-25: qty 100

## 2011-12-25 MED ORDER — ACETAMINOPHEN 325 MG PO TABS
ORAL_TABLET | ORAL | Status: AC
  Filled 2011-12-25: qty 2

## 2011-12-25 MED ORDER — DIPHENHYDRAMINE HCL 25 MG PO CAPS
25.0000 mg | ORAL_CAPSULE | Freq: Once | ORAL | Status: AC
  Administered 2011-12-25: 25 mg via ORAL

## 2011-12-25 MED ORDER — SODIUM CHLORIDE 0.9 % IJ SOLN
10.0000 mL | INTRAMUSCULAR | Status: AC | PRN
  Administered 2011-12-25: 10 mL
  Filled 2011-12-25: qty 10

## 2011-12-25 MED ORDER — FUROSEMIDE 10 MG/ML IJ SOLN
INTRAMUSCULAR | Status: AC
  Filled 2011-12-25: qty 2

## 2011-12-25 MED ORDER — FUROSEMIDE 10 MG/ML IJ SOLN
20.0000 mg | Freq: Once | INTRAMUSCULAR | Status: AC
  Administered 2011-12-25: 20 mg via INTRAVENOUS

## 2011-12-25 MED ORDER — MAGNESIUM SULFATE 50 % IJ SOLN: 1000.0000 mg | Freq: Once | INTRAVENOUS | Status: DC

## 2011-12-25 MED ORDER — SODIUM CHLORIDE 0.9 % IJ SOLN
INTRAMUSCULAR | Status: AC
  Filled 2011-12-25: qty 10

## 2011-12-25 MED ORDER — DIPHENHYDRAMINE HCL 25 MG PO CAPS
ORAL_CAPSULE | ORAL | Status: AC
  Filled 2011-12-25: qty 1

## 2011-12-25 MED ORDER — ACETAMINOPHEN 325 MG PO TABS
650.0000 mg | ORAL_TABLET | Freq: Once | ORAL | Status: AC
  Administered 2011-12-25: 650 mg via ORAL

## 2011-12-26 LAB — TYPE AND SCREEN
Antibody Screen: NEGATIVE
Unit division: 0

## 2011-12-28 ENCOUNTER — Telehealth (HOSPITAL_COMMUNITY): Payer: Self-pay | Admitting: *Deleted

## 2011-12-28 NOTE — Telephone Encounter (Signed)
Knee high

## 2012-01-07 ENCOUNTER — Other Ambulatory Visit (HOSPITAL_COMMUNITY): Payer: Self-pay | Admitting: Oncology

## 2012-01-07 DIAGNOSIS — C9000 Multiple myeloma not having achieved remission: Secondary | ICD-10-CM

## 2012-01-21 ENCOUNTER — Other Ambulatory Visit (HOSPITAL_COMMUNITY): Payer: Self-pay | Admitting: Oncology

## 2012-01-21 DIAGNOSIS — G629 Polyneuropathy, unspecified: Secondary | ICD-10-CM

## 2012-01-21 MED ORDER — GABAPENTIN 300 MG PO CAPS: ORAL_CAPSULE | ORAL | Status: DC

## 2012-01-23 ENCOUNTER — Emergency Department (HOSPITAL_COMMUNITY)
Admission: EM | Admit: 2012-01-23 | Discharge: 2012-01-23 | Disposition: A | Discharge status: Home / Self Care | Payer: PRIVATE HEALTH INSURANCE | Attending: Emergency Medicine | Admitting: Emergency Medicine

## 2012-01-23 ENCOUNTER — Encounter (HOSPITAL_COMMUNITY): Payer: Self-pay | Admitting: Emergency Medicine

## 2012-01-23 DIAGNOSIS — Z23 Encounter for immunization: Secondary | ICD-10-CM | POA: Insufficient documentation

## 2012-01-23 DIAGNOSIS — S51809A Unspecified open wound of unspecified forearm, initial encounter: Secondary | ICD-10-CM | POA: Insufficient documentation

## 2012-01-23 DIAGNOSIS — S51859A Open bite of unspecified forearm, initial encounter: Secondary | ICD-10-CM

## 2012-01-23 DIAGNOSIS — Z809 Family history of malignant neoplasm, unspecified: Secondary | ICD-10-CM | POA: Insufficient documentation

## 2012-01-23 DIAGNOSIS — I10 Essential (primary) hypertension: Secondary | ICD-10-CM | POA: Insufficient documentation

## 2012-01-23 DIAGNOSIS — IMO0001 Reserved for inherently not codable concepts without codable children: Secondary | ICD-10-CM | POA: Insufficient documentation

## 2012-01-23 MED ORDER — TETANUS-DIPHTH-ACELL PERTUSSIS 5-2.5-18.5 LF-MCG/0.5 IM SUSP: 0.5000 mL | Freq: Once | INTRAMUSCULAR | Status: DC

## 2012-01-23 MED ORDER — TETANUS-DIPHTHERIA TOXOIDS TD 5-2 LFU IM INJ
0.5000 mL | INJECTION | Freq: Once | INTRAMUSCULAR | Status: AC
  Administered 2012-01-23: 0.5 mL via INTRAMUSCULAR

## 2012-01-23 MED ORDER — AMOXICILLIN-POT CLAVULANATE 875-125 MG PO TABS: 1.0000 | ORAL_TABLET | Freq: Two times a day (BID) | ORAL | Status: AC

## 2012-01-23 MED ORDER — AMOXICILLIN-POT CLAVULANATE 875-125 MG PO TABS: 1.0000 | ORAL_TABLET | Freq: Once | ORAL | Status: DC

## 2012-01-23 MED ORDER — BACITRACIN ZINC 500 UNIT/GM EX OINT
TOPICAL_OINTMENT | CUTANEOUS | Status: AC
  Filled 2012-01-23: qty 0.9

## 2012-01-23 MED ORDER — AMOXICILLIN-POT CLAVULANATE 875-125 MG PO TABS
1.0000 | ORAL_TABLET | Freq: Two times a day (BID) | ORAL | Status: DC
Start: 2012-01-23 — End: 2012-01-23

## 2012-01-23 NOTE — ED Notes (Signed)
Pt presents with cat bite/scratch on rt forearm. Bleeding controlled. Pt states cat is his pet and is fully to date on vaccines. Pt is a recent recipient of bone marrow transplant and chemo therapy. Pt is concerned of risk of infection. No distress noted.

## 2012-01-23 NOTE — ED Notes (Signed)
Pt has abrasion to the rt forearm. It was the pt's cat and it snapped at him. Pt states the cat's shot are up to date.

## 2012-01-23 NOTE — ED Provider Notes (Signed)
History     CSN: [id number]  Arrival date & time 01/23/12  1218   First MD Initiated Contact with Patient 01/23/12 1329      Chief Complaint  Patient presents with  . Animal Bite    (Consider location/radiation/quality/duration/timing/severity/associated sxs/prior treatment) HPI Comments: [name] presents with an abrasion to his right forearm after being "snapped" at by his cat, who is utd on his rabies vaccine.  The injury occurred 2 hours before presentation here.  He is concerned for developing infection as he underwent a bone marrow transplant 2 months ago secondary to multiple myeloma.  He is scheduled for recheck of his bloodwork next week when he has his next appointment with Dr. Briant Cedar.  He does not know what his wbc's have been recently.  He also is out of date for tetanus.  He denies any significant pain at the site of the injury.  He washed it with hydrogen peroxide prior to arrival here.  The history is provided by the patient.    Past Medical History  Diagnosis Date  . Hypertension   . Multiple myeloma   . Ruptured cervical disc   . Fall resulting in striking against other object     paralyzed  . 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 Multiple myeloma 01/26/2011    Past Surgical History  Procedure Date  . Inguinal hernia repair   . Anterior cervical decomp/discectomy fusion   . Bone marrow aspirate and biopsy wiith lumbar puncture   . Melanoma excision     rt. breast  . Cervical spine surgery     Family History  Problem Relation Age of Onset  . Cancer Sister   . Cancer Brother     History  Substance Use Topics  . Smoking status: Never Smoker   . Smokeless tobacco: Never Used  . Alcohol Use: No      Review of Systems  Constitutional: Negative for fever and chills.  HENT: Negative for facial swelling.   Respiratory: Negative for shortness of breath and wheezing.   Skin: Positive for wound.  Neurological:  Negative for numbness.    Allergies  Review of patient's allergies indicates no known allergies.  Home Medications   Current Outpatient Rx  Name Route Sig Dispense Refill  . LISINOPRIL 10 MG PO TABS Oral Take 10 mg by mouth daily.      . ACYCLOVIR 400 MG PO TABS Oral Take 400 mg by mouth daily. Pt to start taking this June 18, 2011.    Marland Kitchen ALLOPURINOL 100 MG PO TABS Oral Take 100 mg by mouth daily.      . AMOXICILLIN-POT CLAVULANATE 875-125 MG PO TABS Oral Take 1 tablet by mouth every 12 (twelve) hours. 20 tablet 0  . OMEGA-3 FATTY ACIDS 1000 MG PO CAPS Oral Take 2 g by mouth daily.      . FUROSEMIDE 40 MG PO TABS Oral Take 1 tablet by mouth 3 (three) times a week.    Marland Kitchen GABAPENTIN 300 MG PO CAPS  Take 1 tablet PO at HS x 3 days, then 2 tablet PO at HS x 3 days, and then 3 tablets, PO at HS thereafter. 90 capsule 2  . MAGNESIUM HYDROXIDE 800 MG/5ML PO SUSP Oral Take 30 mLs by mouth every evening.    Marland Kitchen ONDANSETRON HCL 8 MG PO TABS Oral Take 1 tablet (8 mg total) by mouth 2 (two) times daily as needed. Take 1 tablet two  times a day as needed for nausea or vomiting. 30 tablet 1  . OXYCODONE HCL 5 MG PO TABS  1-2 tablets every 4 hours as needed for pain 100 tablet 0  . PROCHLORPERAZINE 25 MG RE SUPP Rectal Place 1 suppository (25 mg total) rectally every 6 (six) hours as needed for nausea. 12 suppository 2  . KAYEXALATE PO Oral Take by mouth once a week. On Saturdays    . TESTOSTERONE CYPIONATE 200 MG/ML IM OIL Intramuscular Inject into the muscle every 21 ( twenty-one) days. Injection due every 3 weeks      BP 112/80  Pulse 72  Temp 98.2 F (36.8 C) (Oral)  Resp 18  Wt 220 lb (99.791 kg)  SpO2 100%  Physical Exam  Constitutional: He is oriented to person, place, and time. He appears well-developed and well-nourished.  HENT:  Head: Normocephalic.  Cardiovascular: Normal rate.   Pulmonary/Chest: Effort normal.  Musculoskeletal: Normal range of motion. He exhibits no edema and no  tenderness.  Neurological: He is alert and oriented to person, place, and time. No sensory deficit.  Skin: Laceration noted.       1 cm linear well approximated skin tear mid right  volar forearm with a faint scab distal to the wound site which may represent a  shallow  puncture.  The wound site is soft and nontender, there is no drainage from the wounds and no palpable foreign body.  Sensation distal to the injury site is intact.      ED Course  Procedures (including critical care time)  Labs Reviewed - No data to display No results found.   1. Cat bite of forearm    Patient's prior chart was reviewed and he did have a CBC completed on 12/24/2011 which showed a WBC of 3.9 with an absolute neutrophil count of 2.1 which is normal range.   MDM  Discussed injury and patient's medical status with Dr. Rulon Abide who agrees with prophylactic treatment with Augmentin.  This was prescribed for him.  His tetanus was also updated at this visit.  He was encouraged to followup with his PCP or return here if needed for any complications with this wound.        Burgess Amor, [place] 01/23/12 1812

## 2012-01-25 NOTE — ED Provider Notes (Signed)
This is a low risk wound - it is a scratch made by the teeth, per pt report he flushed the wound by squeezing blood out of it and then used a fair amount of peroxide.  Given Augmentin Rx. Wait and watch approach discussed with patient - if the wound changes he can start taking the medication and get it evaluated again.  Risks/benefits of antibiotic therapy discussed and he will not fill antibiotic unless the wound changes.  For continued improvement he'll use conservative wound management and follow with his PCP. No closure indicated.  Medical screening examination/treatment/procedure(s) were performed by non-physician practitioner and as supervising physician I was immediately available for consultation/collaboration.  Jones Skene, M.D.     Jones Skene, MD 01/25/12 1616

## 2012-01-26 ENCOUNTER — Encounter (HOSPITAL_COMMUNITY): Payer: PRIVATE HEALTH INSURANCE | Attending: Oncology

## 2012-01-26 DIAGNOSIS — C9000 Multiple myeloma not having achieved remission: Secondary | ICD-10-CM | POA: Insufficient documentation

## 2012-01-26 LAB — PROTEIN, URINE, 24 HOUR
Collection Interval-UPROT: 24 hours
Protein, 24H Urine: 1932 mg/d — ABNORMAL HIGH (ref 50–100)
Urine Total Volume-UPROT: 3275 mL

## 2012-01-26 LAB — CBC WITH DIFFERENTIAL/PLATELET
Eosinophils Absolute: 0.1 10*3/uL (ref 0.0–0.7)
Eosinophils Relative: 2 % (ref 0–5)
HCT: 29.7 % — ABNORMAL LOW (ref 39.0–52.0)
Lymphs Abs: 2.3 10*3/uL (ref 0.7–4.0)
MCH: 34.7 pg — ABNORMAL HIGH (ref 26.0–34.0)
MCV: 99 fL (ref 78.0–100.0)
Monocytes Absolute: 0.5 10*3/uL (ref 0.1–1.0)
Platelets: 49 10*3/uL — ABNORMAL LOW (ref 150–400)
RBC: 3 MIL/uL — ABNORMAL LOW (ref 4.22–5.81)
RDW: 17.2 % — ABNORMAL HIGH (ref 11.5–15.5)

## 2012-01-26 LAB — COMPREHENSIVE METABOLIC PANEL
ALT: 21 U/L (ref 0–53)
Calcium: 9.4 mg/dL (ref 8.4–10.5)
Creatinine, Ser: 3.06 mg/dL — ABNORMAL HIGH (ref 0.50–1.35)
GFR calc Af Amer: 23 mL/min — ABNORMAL LOW (ref >90)
GFR calc non Af Amer: 20 mL/min — ABNORMAL LOW (ref >90)
Glucose, Bld: 111 mg/dL — ABNORMAL HIGH (ref 70–99)
Sodium: 142 mEq/L (ref 135–145)
Total Protein: 6.3 g/dL (ref 6.0–8.3)

## 2012-01-26 LAB — CREATININE CLEARANCE, URINE, 24 HOUR
Creatinine Clearance: 32 mL/min — ABNORMAL LOW (ref 75–125)
Creatinine, Urine: 43.09 mg/dL

## 2012-01-26 NOTE — Progress Notes (Signed)
Labs drawn today for cbc/diff,beta 2 mic,IGG,IGA,IGM,Immunofixation, protein electrophresis,kllc, 24 hour urine CC, Protein, Immofixation

## 2012-01-27 LAB — BETA 2 MICROGLOBULIN, SERUM: Beta-2 Microglobulin: 5.81 mg/L — ABNORMAL HIGH (ref 1.01–1.73)

## 2012-01-27 LAB — IGG, IGA, IGM
IgA: 38 mg/dL — ABNORMAL LOW (ref 68–379)
IgG (Immunoglobin G), Serum: 581 mg/dL — ABNORMAL LOW (ref 650–1600)
IgM, Serum: 33 mg/dL — ABNORMAL LOW (ref 41–251)

## 2012-01-27 LAB — KAPPA/LAMBDA LIGHT CHAINS
Kappa, lambda light chain ratio: 1.84 — ABNORMAL HIGH (ref 0.26–1.65)
Lambda free light chains: 0.91 mg/dL (ref 0.57–2.63)

## 2012-01-28 LAB — PROTEIN ELECTROPHORESIS, SERUM
Albumin ELP: 67.4 % — ABNORMAL HIGH (ref 55.8–66.1)
Alpha-1-Globulin: 4.7 % (ref 2.9–4.9)
Alpha-2-Globulin: 11.2 % (ref 7.1–11.8)
Beta 2: 3.3 % (ref 3.2–6.5)
Beta Globulin: 5.3 % (ref 4.7–7.2)
Total Protein ELP: 6 g/dL (ref 6.0–8.3)

## 2012-01-28 LAB — IMMUNOFIXATION ELECTROPHORESIS: IgA: 42 mg/dL — ABNORMAL LOW (ref 68–379)

## 2012-01-29 LAB — UIFE/LIGHT CHAINS/TP QN, 24-HR UR
Alpha 2, Urine: DETECTED — AB
Free Kappa Lt Chains,Ur: 3.85 mg/dL — ABNORMAL HIGH (ref 0.14–2.42)
Free Kappa/Lambda Ratio: 6.11 ratio (ref 2.04–10.37)
Free Lambda Lt Chains,Ur: 0.63 mg/dL (ref 0.02–0.67)
Free Lt Chn Excr Rate: 126.09 mg/d
Time: 24 hours
Total Protein, Urine-Ur/day: 1313 mg/d — ABNORMAL HIGH (ref 10–140)
Total Protein, Urine: 40.1 mg/dL
Volume, Urine: 3275 mL

## 2012-02-03 ENCOUNTER — Encounter (HOSPITAL_BASED_OUTPATIENT_CLINIC_OR_DEPARTMENT_OTHER): Payer: PRIVATE HEALTH INSURANCE | Admitting: Oncology

## 2012-02-03 VITALS — BP 147/87 | HR 62 | Temp 97.2°F | Resp 18

## 2012-02-03 DIAGNOSIS — C9000 Multiple myeloma not having achieved remission: Secondary | ICD-10-CM

## 2012-02-03 LAB — CBC WITH DIFFERENTIAL/PLATELET
Basophils Absolute: 0 10*3/uL (ref 0.0–0.1)
Basophils Relative: 0 % (ref 0–1)
Eosinophils Relative: 2 % (ref 0–5)
HCT: 30.2 % — ABNORMAL LOW (ref 39.0–52.0)
MCHC: 35.4 g/dL (ref 30.0–36.0)
MCV: 99.3 fL (ref 78.0–100.0)
Monocytes Absolute: 0.4 10*3/uL (ref 0.1–1.0)
Platelets: 39 10*3/uL — ABNORMAL LOW (ref 150–400)
RDW: 17.1 % — ABNORMAL HIGH (ref 11.5–15.5)
WBC: 5.3 10*3/uL (ref 4.0–10.5)

## 2012-02-03 NOTE — Progress Notes (Signed)
This is a procedure note  The patient is here for bone marrow aspiration and biopsy approximately 2 months after his bone marrow transplant for relapsing multiple myeloma. After informed consent he asked me to do the right posterior superior iliac spinous process area. The area was identified easily and 3 Betadine swabs were used to cleanse the skin followed by 2% plain Xylocaine anesthesia consistent with 10 cc. Aspiration was obtained for routine pathology evaluation and cytogenetics. A bone marrow biopsy was then obtained without incident for pathology evaluation. He was sent home in good condition

## 2012-02-03 NOTE — Patient Instructions (Addendum)
[facility] Discharge Instructions for Post Bone Marrow Procedure [name]  DOB 1946/03/15 CSN [id number]  MRN [id number] Dr. [name]  Today you had a bone marrow biopsy and aspirate of the right hip   Please keep the pressure dressing in place for at least 24 hours.  Have someone check your dressing periodically for bleeding.  If needed you can reapply a pressure dressing to the site.  Take pain medication  as directed.  IF BLEEDING REOCCURS THAT SHOULD BE REPORTED IMMEDIATELY. Call the Cancer Center at [phone] if during business hours. Or report to the Emergency Room.   I have been informed and understand all the instructions given to me. I know to contact the clinic, my physician, or go to the Emergency Department if any problems should occur. I do not have any questions at this time, but understand that I may call the clinic during office hours at [phone] should I have any questions or need assistance in obtaining follow up care.    __________________________________________  _____________  __________ Signature of Patient or Authorized Representative            Date                   Time    __________________________________________ Nurse's Signature

## 2012-02-03 NOTE — Progress Notes (Signed)
Pt arrived for bone marrow biopsy. Verbalized understanding of procedure. NPO except water this am. [facility] BONE MARROW BIOPSY/ASPIRATE PROGRESS NOTE  [name] presents for Bone Marrow biopsy per MD orders. [name] verbalized understanding of procedure. Consent reviewed and signed.  [name] positioned supine for procedure. Time-out performed and Bone Marrow Checklist. Procedure began at 0850. Xylocaine 2% 10 cc used for local and administered to patient by Dr.Neijstrom. Procedure completed at 0909. Patient tolerated well. Pressure dressing applied to the right hip with instructions to leave in place for 24 hours. Patient instructed to report any bleeding that saturates dressing and to take pain medication  as directed. Dressing dry and intact to the right hip on discharge.

## 2012-02-09 ENCOUNTER — Encounter (HOSPITAL_BASED_OUTPATIENT_CLINIC_OR_DEPARTMENT_OTHER): Payer: PRIVATE HEALTH INSURANCE

## 2012-02-09 DIAGNOSIS — C9000 Multiple myeloma not having achieved remission: Secondary | ICD-10-CM

## 2012-02-09 MED ORDER — SODIUM CHLORIDE 0.9 % IV SOLN
INTRAVENOUS | Status: DC
  Administered 2012-02-09: 11:00:00 via INTRAVENOUS

## 2012-02-09 MED ORDER — SODIUM CHLORIDE 0.9 % IJ SOLN
INTRAMUSCULAR | Status: AC
  Filled 2012-02-09: qty 10

## 2012-02-09 MED ORDER — SODIUM CHLORIDE 0.9 % IV SOLN
60.0000 mg | Freq: Once | INTRAVENOUS | Status: AC
  Administered 2012-02-09: 60 mg via INTRAVENOUS
  Filled 2012-02-09: qty 500

## 2012-02-09 NOTE — Progress Notes (Signed)
Tolerated well

## 2012-02-24 ENCOUNTER — Encounter (HOSPITAL_BASED_OUTPATIENT_CLINIC_OR_DEPARTMENT_OTHER): Payer: PRIVATE HEALTH INSURANCE

## 2012-02-24 ENCOUNTER — Encounter (HOSPITAL_COMMUNITY): Payer: PRIVATE HEALTH INSURANCE | Attending: Oncology | Admitting: Oncology

## 2012-02-24 VITALS — BP 127/79 | HR 60 | Temp 97.1°F | Resp 20 | Wt 232.8 lb

## 2012-02-24 DIAGNOSIS — C9000 Multiple myeloma not having achieved remission: Secondary | ICD-10-CM

## 2012-02-24 DIAGNOSIS — Z23 Encounter for immunization: Secondary | ICD-10-CM

## 2012-02-24 LAB — CBC WITH DIFFERENTIAL/PLATELET
Basophils Absolute: 0 10*3/uL (ref 0.0–0.1)
Basophils Relative: 0 % (ref 0–1)
Eosinophils Absolute: 0.1 10*3/uL (ref 0.0–0.7)
MCH: 35.9 pg — ABNORMAL HIGH (ref 26.0–34.0)
MCHC: 35.2 g/dL (ref 30.0–36.0)
Neutro Abs: 2.6 10*3/uL (ref 1.7–7.7)
Neutrophils Relative %: 56 % (ref 43–77)
Platelets: 46 10*3/uL — ABNORMAL LOW (ref 150–400)
RDW: 16.7 % — ABNORMAL HIGH (ref 11.5–15.5)

## 2012-02-24 LAB — COMPREHENSIVE METABOLIC PANEL
ALT: 20 U/L (ref 0–53)
AST: 20 U/L (ref 0–37)
Albumin: 3.6 g/dL (ref 3.5–5.2)
Alkaline Phosphatase: 45 U/L (ref 39–117)
BUN: 49 mg/dL — ABNORMAL HIGH (ref 6–23)
Potassium: 4.2 mEq/L (ref 3.5–5.1)
Sodium: 139 mEq/L (ref 135–145)
Total Protein: 6.3 g/dL (ref 6.0–8.3)

## 2012-02-24 MED ORDER — INFLUENZA VIRUS VACC SPLIT PF IM SUSP
0.5000 mL | Freq: Once | INTRAMUSCULAR | Status: AC
  Administered 2012-02-24: 0.5 mL via INTRAMUSCULAR

## 2012-02-24 MED ORDER — INFLUENZA VIRUS VACC SPLIT PF IM SUSP
INTRAMUSCULAR | Status: AC
  Filled 2012-02-24: qty 0.5

## 2012-02-24 NOTE — Patient Instructions (Addendum)
[facility]  Discharge Instructions  RECOMMENDATIONS MADE BY THE CONSULTANT AND ANY TEST RESULTS WILL BE SENT TO YOUR REFERRING DOCTOR.   Flu vaccine given today. Dr.Neijstrom is changing lab work to every 6 weeks and Pamidronate every 6 weeks. Return to see doctor after 12 weeks. Report any issues/concerns to clinic as needed.   I acknowledge that I have been informed and understand all the instructions given to me and received a copy. I do not have any more questions at this time, but understand that I may call the Specialty Clinic at [facility] at [phone] during business hours should I have any further questions or need assistance in obtaining follow-up care.    __________________________________________  _____________  __________ Signature of Patient or Authorized Representative            Date                   Time    __________________________________________ Nurse's Signature

## 2012-02-24 NOTE — Progress Notes (Signed)
Diagnosis #1 relapsing multiple myeloma kappa light chain disease status post his second bone marrow transplant in July 2013. His bone marrow aspirate and biopsy the other day revealed no myeloma this elements and cytogenetics were also excellent. His counts are starting to recover nicely. He feels very good. Still somewhat weak but getting better. We will change his labs at his request every 6 weeks and his pamidronate every 6 weeks. He wanted to know if he could resume his testosterone through his PCP. and I think that is fine at this time. He also receive the flu shot today. I have placed a phone call to Dr. Greggory Stallion at [facility] to see if he wants to initiate any maintenance therapy. I will call the patient when I hear from Dr. Greggory Stallion. Otherwise we will see him in 12 weeks bilateral be every 6 weeks and the pamidronate will be every 6 weeks I have spoken now with Dr. Greggory Stallion and we are going to try him on maintenance pomalidomide and dexamethasone. The pomalidomide will be 1 mg a day to begin with and after one to 2 months we will increase it to 2 mg if his renal function maintains itself and he can tolerate it. We will wait to start this and to his platelets are at least 70,000. The dexamethasone will be 8 mg once a week. I will speak with the patient today

## 2012-02-24 NOTE — Progress Notes (Signed)
Labs drawn today for cbc/diff,cmp,kllc,b58mic, IGG,IGA,IGm

## 2012-02-25 LAB — IGG, IGA, IGM: IgG (Immunoglobin G), Serum: 486 mg/dL — ABNORMAL LOW (ref 650–1600)

## 2012-02-25 LAB — BETA 2 MICROGLOBULIN, SERUM: Beta-2 Microglobulin: 5.9 mg/L — ABNORMAL HIGH (ref 1.01–1.73)

## 2012-02-26 LAB — KAPPA/LAMBDA LIGHT CHAINS
Kappa free light chain: 2.45 mg/dL — ABNORMAL HIGH (ref 0.33–1.94)
Kappa, lambda light chain ratio: 2.08 — ABNORMAL HIGH (ref 0.26–1.65)
Lambda free light chains: 1.18 mg/dL (ref 0.57–2.63)

## 2012-03-10 ENCOUNTER — Encounter (HOSPITAL_BASED_OUTPATIENT_CLINIC_OR_DEPARTMENT_OTHER): Payer: PRIVATE HEALTH INSURANCE

## 2012-03-10 DIAGNOSIS — C9000 Multiple myeloma not having achieved remission: Secondary | ICD-10-CM

## 2012-03-10 MED ORDER — SODIUM CHLORIDE 0.9 % IJ SOLN
INTRAMUSCULAR | Status: AC
  Filled 2012-03-10: qty 10

## 2012-03-10 MED ORDER — SODIUM CHLORIDE 0.9 % IV SOLN
60.0000 mg | Freq: Once | INTRAVENOUS | Status: AC
  Administered 2012-03-10: 60 mg via INTRAVENOUS
  Filled 2012-03-10: qty 500

## 2012-03-10 MED ORDER — SODIUM CHLORIDE 0.9 % IV SOLN
Freq: Once | INTRAVENOUS | Status: AC
  Administered 2012-03-10: 50 mL via INTRAVENOUS

## 2012-03-10 MED ORDER — SPIRONOLACTONE 100 MG PO TABS: 100.0000 mg | ORAL_TABLET | Freq: Every day | ORAL | Status: DC

## 2012-03-10 NOTE — Progress Notes (Signed)
Tolerated well

## 2012-03-16 ENCOUNTER — Telehealth (HOSPITAL_COMMUNITY): Payer: Self-pay | Admitting: *Deleted

## 2012-03-16 ENCOUNTER — Encounter (HOSPITAL_BASED_OUTPATIENT_CLINIC_OR_DEPARTMENT_OTHER): Payer: PRIVATE HEALTH INSURANCE

## 2012-03-16 DIAGNOSIS — E875 Hyperkalemia: Secondary | ICD-10-CM

## 2012-03-16 DIAGNOSIS — C9000 Multiple myeloma not having achieved remission: Secondary | ICD-10-CM

## 2012-03-16 LAB — CBC WITH DIFFERENTIAL/PLATELET
Basophils Absolute: 0 10*3/uL (ref 0.0–0.1)
Basophils Relative: 0 % (ref 0–1)
Eosinophils Absolute: 0.1 10*3/uL (ref 0.0–0.7)
MCH: 37.2 pg — ABNORMAL HIGH (ref 26.0–34.0)
MCHC: 35.5 g/dL (ref 30.0–36.0)
Neutrophils Relative %: 54 % (ref 43–77)
Platelets: 48 10*3/uL — ABNORMAL LOW (ref 150–400)
RBC: 2.77 MIL/uL — ABNORMAL LOW (ref 4.22–5.81)

## 2012-03-16 LAB — COMPREHENSIVE METABOLIC PANEL
ALT: 20 U/L (ref 0–53)
AST: 21 U/L (ref 0–37)
Albumin: 3.9 g/dL (ref 3.5–5.2)
Alkaline Phosphatase: 40 U/L (ref 39–117)
Potassium: 5.7 mEq/L — ABNORMAL HIGH (ref 3.5–5.1)
Sodium: 139 mEq/L (ref 135–145)
Total Protein: 6.9 g/dL (ref 6.0–8.3)

## 2012-03-16 NOTE — Progress Notes (Signed)
Labs drawn today for cbc/diff,cmp,kllc,mm 

## 2012-03-16 NOTE — Telephone Encounter (Signed)
Pt notified to take a dose of kayexalate on Thursday and one on Saturday. We will recheck on Tuesday when here.

## 2012-03-17 LAB — KAPPA/LAMBDA LIGHT CHAINS: Kappa free light chain: 1.04 mg/dL (ref 0.33–1.94)

## 2012-03-18 LAB — MULTIPLE MYELOMA PANEL, SERUM
Albumin ELP: 70 % — ABNORMAL HIGH (ref 55.8–66.1)
Alpha-2-Globulin: 9.5 % (ref 7.1–11.8)
Beta Globulin: 6 % (ref 4.7–7.2)
IgA: 28 mg/dL — ABNORMAL LOW (ref 68–379)
IgM, Serum: 49 mg/dL — ABNORMAL LOW (ref 41–251)
Total Protein: 6.3 g/dL (ref 6.0–8.3)

## 2012-03-22 ENCOUNTER — Encounter (HOSPITAL_COMMUNITY): Payer: PRIVATE HEALTH INSURANCE

## 2012-03-22 ENCOUNTER — Encounter (HOSPITAL_COMMUNITY): Payer: PRIVATE HEALTH INSURANCE | Attending: Oncology | Admitting: Oncology

## 2012-03-22 VITALS — BP 112/69 | HR 70 | Temp 97.0°F | Resp 18 | Wt 235.0 lb

## 2012-03-22 DIAGNOSIS — C9000 Multiple myeloma not having achieved remission: Secondary | ICD-10-CM | POA: Insufficient documentation

## 2012-03-22 DIAGNOSIS — E875 Hyperkalemia: Secondary | ICD-10-CM

## 2012-03-22 DIAGNOSIS — C9002 Multiple myeloma in relapse: Secondary | ICD-10-CM

## 2012-03-22 LAB — BASIC METABOLIC PANEL
Calcium: 8.9 mg/dL (ref 8.4–10.5)
Creatinine, Ser: 3.12 mg/dL — ABNORMAL HIGH (ref 0.50–1.35)
GFR calc Af Amer: 23 mL/min — ABNORMAL LOW (ref >90)
GFR calc non Af Amer: 20 mL/min — ABNORMAL LOW (ref >90)
Sodium: 141 mEq/L (ref 135–145)

## 2012-03-22 NOTE — Progress Notes (Signed)
Labs drawn today for bmp 

## 2012-03-22 NOTE — Progress Notes (Signed)
Diagnosis #1 relapsed multiple myeloma, kappa light chain disease, status post his second bone marrow transplant in July 2013. His bone marrow aspirate and biopsy here in September revealed no evidence for myeloma including his cytogenetics. His blood counts are starting to recover nicely. If his counts remain stable through November 20 I will see him shortly thereafter to start Pomalidomide 1 mg daily and weekly dexamethasone 8 mg once a day. If all goes well I will increase the pomalidomide to 2 mg daily prior to his visit with Dr. Greggory Stallion at [facility]. I would leave the dexamethasone at the same dose. We also will continue his pamidronate every 6 weeks. Problem #2 hyperkalemia-he stopped using Kayexalate after his discharge from [facility] for his bone marrow transplant. Potassium was 5.7 the other day and after one dose of Kayexalate it is down to 5.1. I have instructed him to take the Kayexalate weekly as he did in the past. 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 these were exacerbated he states after his transplant but remained stable His laboratory work is excellent and we will fax it to Dr. Greggory Stallion. It remains stable through November I will start the pomalidomide and dexamethasone.  I have stopped his allopurinol as of today. He is also multiple vitamin which is basically B. vitamins. His peripheral neuropathy was improved by the gabapentin but he did not take it as directed. I've instructed him to take it twice a day and see if this any better. He knows we can increase it to higher doses if needed. He will let he know in November. Otherwise I will see him just after the lab work in November if all is stable. I think by the first of the year we could stop the Bactrim and the acyclovir  if all goes well.

## 2012-03-22 NOTE — Patient Instructions (Addendum)
[facility]  Discharge Instructions  RECOMMENDATIONS MADE BY THE CONSULTANT AND ANY TEST RESULTS WILL BE SENT TO YOUR REFERRING DOCTOR.   EXAM FINDINGS BY MD TODAY AND SIGNS AND SYMPTOMS TO REPORT TO CLINIC OR PRIMARY MD: exam and discussion by MD.  Report fevers, sweats, uncontrolled pain or other problems.  MEDICATIONS PRESCRIBED: none  SPECIAL INSTRUCTIONS/FOLLOW-UP: Lab work Needed as scheduled  and Return to Clinic after labs to see Dr. Mariel Sleet.   I acknowledge that I have been informed and understand all the instructions given to me and received a copy. I do not have any more questions at this time, but understand that I may call the Specialty Clinic at [facility] at [phone] during business hours should I have any further questions or need assistance in obtaining follow-up care.    __________________________________________  _____________  __________ Signature of Patient or Authorized Representative            Date                   Time    __________________________________________ Nurse's Signature

## 2012-03-25 ENCOUNTER — Other Ambulatory Visit (HOSPITAL_COMMUNITY): Payer: PRIVATE HEALTH INSURANCE

## 2012-03-29 ENCOUNTER — Encounter: Payer: Self-pay | Admitting: Oncology

## 2012-04-06 ENCOUNTER — Other Ambulatory Visit (HOSPITAL_COMMUNITY): Payer: Self-pay

## 2012-04-06 ENCOUNTER — Encounter (HOSPITAL_BASED_OUTPATIENT_CLINIC_OR_DEPARTMENT_OTHER): Payer: PRIVATE HEALTH INSURANCE

## 2012-04-06 ENCOUNTER — Telehealth (HOSPITAL_COMMUNITY): Payer: Self-pay | Admitting: Oncology

## 2012-04-06 DIAGNOSIS — C9000 Multiple myeloma not having achieved remission: Secondary | ICD-10-CM

## 2012-04-06 LAB — CBC WITH DIFFERENTIAL/PLATELET
Basophils Absolute: 0 10*3/uL (ref 0.0–0.1)
Eosinophils Absolute: 0.1 10*3/uL (ref 0.0–0.7)
Lymphocytes Relative: 35 % (ref 12–46)
Lymphs Abs: 2.2 10*3/uL (ref 0.7–4.0)
Neutrophils Relative %: 57 % (ref 43–77)
Platelets: 51 10*3/uL — ABNORMAL LOW (ref 150–400)
RBC: 2.66 MIL/uL — ABNORMAL LOW (ref 4.22–5.81)
RDW: 14.3 % (ref 11.5–15.5)
WBC: 6.3 10*3/uL (ref 4.0–10.5)

## 2012-04-06 LAB — COMPREHENSIVE METABOLIC PANEL
ALT: 23 U/L (ref 0–53)
AST: 23 U/L (ref 0–37)
Alkaline Phosphatase: 41 U/L (ref 39–117)
CO2: 18 mEq/L — ABNORMAL LOW (ref 19–32)
Calcium: 9.2 mg/dL (ref 8.4–10.5)
GFR calc Af Amer: 22 mL/min — ABNORMAL LOW (ref >90)
Glucose, Bld: 119 mg/dL — ABNORMAL HIGH (ref 70–99)
Potassium: 5.4 mEq/L — ABNORMAL HIGH (ref 3.5–5.1)
Sodium: 144 mEq/L (ref 135–145)
Total Protein: 6.7 g/dL (ref 6.0–8.3)

## 2012-04-06 NOTE — Telephone Encounter (Signed)
I personally reviewed and went over laboratory results with the patient.  KEFALAS,[name]  

## 2012-04-06 NOTE — Progress Notes (Signed)
Labs drawn today for cbc/diff,cmp,kllc,mm panel 

## 2012-04-08 ENCOUNTER — Ambulatory Visit (HOSPITAL_COMMUNITY): Payer: PRIVATE HEALTH INSURANCE

## 2012-04-08 LAB — KAPPA/LAMBDA LIGHT CHAINS: Kappa, lambda light chain ratio: 2.24 — ABNORMAL HIGH (ref 0.26–1.65)

## 2012-04-12 ENCOUNTER — Encounter (HOSPITAL_BASED_OUTPATIENT_CLINIC_OR_DEPARTMENT_OTHER): Payer: PRIVATE HEALTH INSURANCE | Admitting: Oncology

## 2012-04-12 ENCOUNTER — Encounter: Payer: Self-pay | Admitting: Oncology

## 2012-04-12 VITALS — BP 116/74 | HR 70 | Temp 97.3°F | Resp 20 | Wt 237.0 lb

## 2012-04-12 DIAGNOSIS — D696 Thrombocytopenia, unspecified: Secondary | ICD-10-CM

## 2012-04-12 DIAGNOSIS — C9000 Multiple myeloma not having achieved remission: Secondary | ICD-10-CM

## 2012-04-12 DIAGNOSIS — D649 Anemia, unspecified: Secondary | ICD-10-CM

## 2012-04-12 LAB — MULTIPLE MYELOMA PANEL, SERUM
Alpha-1-Globulin: 4.3 % (ref 2.9–4.9)
Alpha-2-Globulin: 10.1 % (ref 7.1–11.8)
Beta Globulin: 5.8 % (ref 4.7–7.2)
IgA: 29 mg/dL — ABNORMAL LOW (ref 68–379)
IgG (Immunoglobin G), Serum: 616 mg/dL — ABNORMAL LOW (ref 650–1600)
IgM, Serum: 59 mg/dL — ABNORMAL LOW (ref 41–251)
M-Spike, %: NOT DETECTED g/dL
Total Protein: 6.5 g/dL (ref 6.0–8.3)

## 2012-04-12 MED ORDER — POMALIDOMIDE 1 MG PO CAPS: 1.0000 mg | ORAL_CAPSULE | Freq: Every day | ORAL | Status: DC

## 2012-04-12 MED ORDER — DEXAMETHASONE 4 MG PO TABS: ORAL_TABLET | ORAL | Status: DC

## 2012-04-12 NOTE — Progress Notes (Signed)
Will discuss with pt on MD visit 11/26

## 2012-04-12 NOTE — Patient Instructions (Addendum)
[facility]  Discharge Instructions  RECOMMENDATIONS MADE BY THE CONSULTANT AND ANY TEST RESULTS WILL BE SENT TO YOUR REFERRING DOCTOR.   EXAM FINDINGS BY MD TODAY AND SIGNS AND SYMPTOMS TO REPORT TO CLINIC OR PRIMARY MD:    MEDICATIONS PRESCRIBED:  Stop both lasix and aldactone for now Kaexylate once a week Restart your testosterone shot once a month  INSTRUCTIONS GIVEN AND DISCUSSED:   SPECIAL INSTRUCTIONS/FOLLOW-UP: 8 weeks from Monday to see Dr. Mariel Sleet   I acknowledge that I have been informed and understand all the instructions given to me and received a copy. I do not have any more questions at this time, but understand that I may call the Specialty Clinic at [facility] at [phone] during business hours should I have any further questions or need assistance in obtaining follow-up care.    __________________________________________  _____________  __________ Signature of Patient or Authorized Representative            Date                   Time    __________________________________________ Nurse's Signature

## 2012-04-12 NOTE — Progress Notes (Signed)
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 Greggory Stallion at [facility]. He is recuperating very nicely. He still has thrombocytopenia and mild anemia. He still has mild edema of his legs. It is less however and he is going to try eliminating the furosemide/Aldactone and making then when necessary rather than the present usage a 3 times a week. This may help his recent hyperkalemia as well. We may also have to decrease the Aldactone should we need to continue to use it. His pretibial areas around the ankles show 1+ pitting edema today but he states they are down to nothing in the morning. He otherwise looks good feels good has degenerative joint pain which is not new or different.  He would like to get rid of the Septra DS in the acyclovir  and we will let him do that if all is well when I see him in 2 months. His counts and deathly recovered but I do think it's time start the pomalidomide at 1 mg a day for 21 days on and 7 days off. We'll do this to begin with to see a tolerates it. He is very worried about tolerated it well. We will also start the dexamethasone 8 mg once a week and he will start that once he receives the pomalidomide in the mail. I will then see him 8 weeks after he starts to make sure he is tolerating this well. He is very reluctant to increase the pomalidomide to 2 mg. I may just try 1 mg a day indefinitely if he is not willing to go up to the 2 mg tablet. If necessary I will communicate with Dr. Greggory Stallion the next time I see the patient. We will continue his monthly lab evaluation. He would also like to restart his testosterone through his primary care physician and I do not have any objection to that.

## 2012-04-21 ENCOUNTER — Encounter (HOSPITAL_COMMUNITY): Payer: PRIVATE HEALTH INSURANCE | Attending: Oncology

## 2012-04-21 VITALS — BP 146/79 | HR 61 | Temp 97.3°F

## 2012-04-21 DIAGNOSIS — C9000 Multiple myeloma not having achieved remission: Secondary | ICD-10-CM | POA: Insufficient documentation

## 2012-04-21 MED ORDER — SODIUM CHLORIDE 0.9 % IV SOLN
60.0000 mg | Freq: Once | INTRAVENOUS | Status: AC
  Administered 2012-04-21: 60 mg via INTRAVENOUS
  Filled 2012-04-21: qty 500

## 2012-04-21 MED ORDER — SODIUM CHLORIDE 0.9 % IJ SOLN
INTRAMUSCULAR | Status: AC
  Filled 2012-04-21: qty 10

## 2012-04-21 MED ORDER — SODIUM CHLORIDE 0.9 % IV SOLN: INTRAVENOUS | Status: DC

## 2012-04-22 ENCOUNTER — Other Ambulatory Visit (HOSPITAL_COMMUNITY): Payer: PRIVATE HEALTH INSURANCE

## 2012-04-27 ENCOUNTER — Telehealth (HOSPITAL_COMMUNITY): Payer: Self-pay | Admitting: Oncology

## 2012-04-27 NOTE — Telephone Encounter (Signed)
Per pharmacy there is no contraindication between the pomalyst and neurontin. Pt notified.

## 2012-05-09 ENCOUNTER — Telehealth (HOSPITAL_COMMUNITY): Payer: Self-pay

## 2012-05-09 ENCOUNTER — Ambulatory Visit (HOSPITAL_COMMUNITY): Payer: PRIVATE HEALTH INSURANCE

## 2012-05-09 NOTE — Telephone Encounter (Signed)
Returned call to patient.  Stated "someone from the company that I get the new Myeloma pill from (Pomalidomide) called me today and was asking me questions about any side effects.  I told her that the only thing that I have noticed is that my hands are a little more numb than before but that was all.  She told me I needed to let Dr. Mariel Sleet know. So that's all I wanted to tell him."   Is able to button buttons and do other tasks without any problems.

## 2012-05-16 ENCOUNTER — Telehealth (HOSPITAL_COMMUNITY): Payer: Self-pay | Admitting: Oncology

## 2012-05-16 NOTE — Telephone Encounter (Signed)
Returned telephone call to [name].  [name] reports increased peripheral neuropathy of of hands and feet since starting Pomalyst.  He reports it started about 7 days after starting the medication. He denies any new back.  No bowel or bladder complaints.  He reports that the feeling in his feet he describes comes and goes. He is walking fine without difficulty.  He has not lost bowel or bladder control.  He denies any new back pain.  He was informed to contact us immediately or report to ED if he loses bowel/bladder control, increased difficulty walking, or severe back pain.   He was informed to hold Pomalyst until seen on 1/10.  KEFALAS,THOMAS

## 2012-05-19 ENCOUNTER — Encounter (HOSPITAL_COMMUNITY): Payer: PRIVATE HEALTH INSURANCE | Attending: Oncology

## 2012-05-19 DIAGNOSIS — C9000 Multiple myeloma not having achieved remission: Secondary | ICD-10-CM | POA: Insufficient documentation

## 2012-05-19 LAB — CBC WITH DIFFERENTIAL/PLATELET
Basophils Absolute: 0 10*3/uL (ref 0.0–0.1)
Basophils Relative: 0 % (ref 0–1)
Eosinophils Relative: 1 % (ref 0–5)
HCT: 28.4 % — ABNORMAL LOW (ref 39.0–52.0)
Lymphocytes Relative: 38 % (ref 12–46)
MCHC: 35.6 g/dL (ref 30.0–36.0)
Monocytes Absolute: 0.4 10*3/uL (ref 0.1–1.0)
Neutro Abs: 2.6 10*3/uL (ref 1.7–7.7)
Platelets: 37 10*3/uL — ABNORMAL LOW (ref 150–400)
RDW: 12.7 % (ref 11.5–15.5)
WBC: 4.9 10*3/uL (ref 4.0–10.5)

## 2012-05-19 LAB — COMPREHENSIVE METABOLIC PANEL
ALT: 18 U/L (ref 0–53)
AST: 15 U/L (ref 0–37)
Albumin: 3.5 g/dL (ref 3.5–5.2)
Alkaline Phosphatase: 35 U/L — ABNORMAL LOW (ref 39–117)
BUN: 50 mg/dL — ABNORMAL HIGH (ref 6–23)
CO2: 23 mEq/L (ref 19–32)
Calcium: 9.2 mg/dL (ref 8.4–10.5)
Chloride: 105 mEq/L (ref 96–112)
Creatinine, Ser: 2.85 mg/dL — ABNORMAL HIGH (ref 0.50–1.35)
GFR calc Af Amer: 25 mL/min — ABNORMAL LOW (ref >90)
GFR calc non Af Amer: 22 mL/min — ABNORMAL LOW (ref >90)
Glucose, Bld: 117 mg/dL — ABNORMAL HIGH (ref 70–99)
Potassium: 5 mEq/L (ref 3.5–5.1)
Sodium: 137 mEq/L (ref 135–145)
Total Bilirubin: 0.4 mg/dL (ref 0.3–1.2)
Total Protein: 6.4 g/dL (ref 6.0–8.3)

## 2012-05-19 NOTE — Progress Notes (Signed)
Labs drawn today for cbc/diff,cmp,kllc,mm panel 

## 2012-05-20 LAB — KAPPA/LAMBDA LIGHT CHAINS: Lambda free light chains: 1.74 mg/dL (ref 0.57–2.63)

## 2012-05-23 LAB — MULTIPLE MYELOMA PANEL, SERUM
Albumin ELP: 67.9 % — ABNORMAL HIGH (ref 55.8–66.1)
Alpha-1-Globulin: 4.8 % (ref 2.9–4.9)
Alpha-2-Globulin: 10.5 % (ref 7.1–11.8)
Beta 2: 3.5 % (ref 3.2–6.5)
IgG (Immunoglobin G), Serum: 461 mg/dL — ABNORMAL LOW (ref 650–1600)
IgM, Serum: 89 mg/dL (ref 41–251)

## 2012-05-26 ENCOUNTER — Other Ambulatory Visit (HOSPITAL_COMMUNITY): Payer: PRIVATE HEALTH INSURANCE

## 2012-05-27 ENCOUNTER — Encounter (HOSPITAL_COMMUNITY): Payer: Self-pay | Admitting: Oncology

## 2012-05-27 ENCOUNTER — Encounter (HOSPITAL_BASED_OUTPATIENT_CLINIC_OR_DEPARTMENT_OTHER): Payer: PRIVATE HEALTH INSURANCE | Admitting: Oncology

## 2012-05-27 VITALS — BP 113/73 | HR 60 | Temp 98.3°F | Resp 18 | Wt 241.5 lb

## 2012-05-27 DIAGNOSIS — C9000 Multiple myeloma not having achieved remission: Secondary | ICD-10-CM

## 2012-05-27 DIAGNOSIS — C9002 Multiple myeloma in relapse: Secondary | ICD-10-CM

## 2012-05-27 DIAGNOSIS — G609 Hereditary and idiopathic neuropathy, unspecified: Secondary | ICD-10-CM

## 2012-05-27 MED ORDER — POMALIDOMIDE 1 MG PO CAPS: ORAL_CAPSULE | ORAL | Status: DC

## 2012-05-27 NOTE — Progress Notes (Signed)
[name], MD [address] [place] [address]  1. Multiple myeloma  pomalidomide (POMALYST) 1 MG capsule, LOVAZA 1 G capsule, Multiple myeloma panel, serum, Kappa/lambda light chains, Multiple myeloma panel, serum, C-reactive protein    CURRENT THERAPY: Pomalyst 1 mg 7 days on and 7 days off.  INTERVAL HISTORY: [name] 67 y.o. 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 Greggory Stallion at [facility].  On 05/16/12, Antwian called complaining of increased peripheral neuropathy after about 10-14 days after initiating this therapy.  He was informed to hold therapy until seen today.  On 05/19/2012, he stopped Dr. Mariel Sleet in the hallway and he was informed to restart Pomalyst 1 mg 7 days on and 7 days off.  This is the lowest does for [place].  If this regimen is tolerated, we will need to consider going up on the dose to 14 days on and 7 days off.  A new Rx was printed today to reflect this change in therapy.    Hassell is doing well.  He wishes to discontinue more medications.  So after reviewing his labs, we will stop his Bactrim and Acyclovir at this time.  All other medications will be continued.   I personally reviewed and went over laboratory results with the patient.  He remains anemic and thrombocytopenic, but these are both very stable.  His renal function is poor, but has improved since November labs.   He was educated on the role of continued Pomalyst therapy.  He does have trace to 1+ ankle edema that resolves in the AM and worsens throughout the day.  He reports that it has not gotten worse than what I witnessed today during physical exam.       We spent some time today discussing football.  He is a [employer] fan and is going to be rooting for them other the [employer].  We spent some time reminiscing about Kodey Xue, Danie Binder,  and Alison Stalling.  He brought up [facility], but I am a little to young to remember his football career, but I can understand and appreciate the legacy he provided the [employer].   Other than fatigue, Shjon denies any complaints.  He will start Pomalyst 1 mg 7 days on and 7 days off once it is delivered to him.     Past Medical History  Diagnosis Date  . Hypertension   . Multiple myeloma   . Ruptured cervical disc   . Fall resulting in striking against other object     paralyzed  . 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 Multiple myeloma 01/26/2011    has ESSENTIAL HYPERTENSION, BENIGN; BRADYCARDIA; and Multiple myeloma on his problem list.      has no known allergies.  [name] had no medications administered during this visit.  Past Surgical History  Procedure Date  . Inguinal hernia repair   . Anterior cervical decomp/discectomy fusion   . Bone marrow aspirate and biopsy wiith lumbar puncture   . Melanoma excision     rt. breast  . Cervical spine surgery     Denies any headaches, dizziness, double vision, fevers, chills, night sweats, nausea, vomiting, diarrhea, constipation, chest pain, heart palpitations, shortness of breath, blood in stool, black tarry stool, urinary pain, urinary burning, urinary frequency, hematuria.   PHYSICAL EXAMINATION  ECOG PERFORMANCE STATUS: 1 - Symptomatic but completely ambulatory  Filed Vitals:   05/27/12 1100  BP: 113/73  Pulse: 60  Temp: 98.3 F (36.8 C)  Resp: 18    GENERAL:alert, no distress, well nourished, well developed, comfortable, cooperative, obese and smiling SKIN: skin color, texture, turgor are normal, no rashes or significant lesions HEAD: Normocephalic, No masses, lesions, tenderness or abnormalities EYES: normal, Conjunctiva are pink and non-injected EARS: External ears normal OROPHARYNX:mucous membranes are moist  NECK: supple, no adenopathy, thyroid normal size, non-tender, without  nodularity, no stridor, non-tender, trachea midline LYMPH:  no palpable lymphadenopathy BREAST:not examined LUNGS: clear to auscultation and percussion HEART: regular rate & rhythm, no murmurs, no gallops, S1 normal and S2 normal ABDOMEN:abdomen soft, non-tender and normal bowel sounds BACK: Back symmetric, no curvature. EXTREMITIES:less then 2 second capillary refill, no joint deformities, effusion, or inflammation, no skin discoloration, no clubbing, no cyanosis, positive findings:  edema trace- 1+ pitting edema in ankles and feet. NEURO: alert & oriented x 3 with fluent speech, no focal motor/sensory deficits, gait normal   LABORATORY DATA: CBC    Component Value Date/Time   WBC 4.9 05/19/2012 1022   RBC 2.69* 05/19/2012 1022   HGB 10.1* 05/19/2012 1022   HCT 28.4* 05/19/2012 1022   PLT 37* 05/19/2012 1022   MCV 105.6* 05/19/2012 1022   MCH 37.5* 05/19/2012 1022   MCHC 35.6 05/19/2012 1022   RDW 12.7 05/19/2012 1022   LYMPHSABS 1.9 05/19/2012 1022   MONOABS 0.4 05/19/2012 1022   EOSABS 0.0 05/19/2012 1022   BASOSABS 0.0 05/19/2012 1022      Chemistry      Component Value Date/Time   NA 137 05/19/2012 1022   K 5.0 05/19/2012 1022   CL 105 05/19/2012 1022   CO2 23 05/19/2012 1022   BUN 50* 05/19/2012 1022   CREATININE 2.85* 05/19/2012 1022   CREATININE 43.09* 01/26/2012 1052      Component Value Date/Time   CALCIUM 9.2 05/19/2012 1022   ALKPHOS 35* 05/19/2012 1022   AST 15 05/19/2012 1022   ALT 18 05/19/2012 1022   BILITOT 0.4 05/19/2012 1022      Results for HATIM, HOMANN (MRN [id number]) as of 05/27/2012 11:32  Ref. Range 05/19/2012 10:22  Albumin ELP Latest Range: 55.8-66.1 % 67.9 (H)  Alpha-1-Globulin Latest Range: 2.9-4.9 % 4.8  Alpha-2-Globulin Latest Range: 7.1-11.8 % 10.5  Beta Globulin Latest Range: 4.7-7.2 % 6.1  Beta 2 Latest Range: 3.2-6.5 % 3.5  Gamma Globulin Latest Range: 11.1-18.8 % 7.2 (L)  M-SPIKE, % No range found NOT DETECTED  SPE Interp. No range found (NOTE)  Comment No range found  (NOTE)  IgG (Immunoglobin G), Serum Latest Range: [id number] mg/dL 782 (L)  IgA Latest Range: 68-379 mg/dL 28 (L)  IgM, Serum Latest Range: 41-251 mg/dL 89  Kappa free light chain Latest Range: 0.33-1.94 mg/dL 9.56 (H)  Lamda free light chains Latest Range: 0.57-2.63 mg/dL 2.13  Kappa, lamda light chain ratio Latest Range: 0.26-1.65  1.38      ASSESSMENT:  1. 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 Greggory Stallion at [facility].  Now on Pomalyst therapy with a dose reduction secondary to increased peripheral neuropathy. 2. Peripheral Neuropathy 3. HTN 4. Hypotestosteronism, on replacement managed by PCP    PLAN:  1. I personally reviewed and went over laboratory results with the patient. 2. Will D/C Bactrim and Acyclovir. 3. Labs every 6 weeks: CBC  diff, CMET, MM panel, CRP 4. New Pomalyst Rx printed and given to prior authorization specialist reflecting new change in Pomalyst frequency 5. Rx: Pomalyst 1 mg daily 7 days on and 7 days off. 6. Patient will start Pomalyst once it is delivered to his house. 7. Return in 8 weeks for follow-up.   All questions were answered. The patient knows to call the clinic with any problems, questions or concerns. We can certainly see the patient much sooner if necessary.  The patient and plan discussed with [name], MD and he is in agreement with the aforementioned.  KEFALAS,THOMAS

## 2012-05-27 NOTE — Patient Instructions (Addendum)
.  [facility] Discharge Instructions  RECOMMENDATIONS MADE BY THE CONSULTANT AND ANY TEST RESULTS WILL BE SENT TO YOUR REFERRING PHYSICIAN.  EXAM FINDINGS BY THE PHYSICIAN TODAY AND SIGNS OR SYMPTOMS TO REPORT TO CLINIC OR PRIMARY PHYSICIAN: Exam per Jenita Seashore PA  MEDICATIONS PRESCRIBED:  Stop acyclovir and bactrim Prescription faxed for new cancer drug and you can start it as soon as delivered, It is called Pomalyst and you take 1 a day for 7 days then off 7 days.  INSTRUCTIONS GIVEN AND DISCUSSED: Labs every 6 weeks and see Dr. Mariel Sleet in 8 weeks  SPECIAL INSTRUCTIONS/FOLLOW-UP:   Thank you for choosing [facility] to provide your oncology and hematology care.  To afford each patient quality time with our providers, please arrive at least 15 minutes before your scheduled appointment time.  With your help, our goal is to use those 15 minutes to complete the necessary work-up to ensure our physicians have the information they need to help with your evaluation and healthcare recommendations.    Effective January 1st, 2014, we ask that you re-schedule your appointment with our physicians should you arrive 10 or more minutes late for your appointment.  We strive to give you quality time with our providers, and arriving late affects you and other patients whose appointments are after yours.    Again, thank you for choosing [facility].  Our hope is that these requests will decrease the amount of time that you wait before being seen by our physicians.       _____________________________________________________________  Should you have questions after your visit to [facility], please contact our office at [phone] between the hours of 8:30 a.m. and 5:00 p.m.  Voicemails left after 4:30 p.m. will not be returned until the following business day.  For prescription refill requests, have your pharmacy contact our office with your  prescription refill request.

## 2012-06-02 ENCOUNTER — Encounter (HOSPITAL_BASED_OUTPATIENT_CLINIC_OR_DEPARTMENT_OTHER): Payer: PRIVATE HEALTH INSURANCE

## 2012-06-02 DIAGNOSIS — C9000 Multiple myeloma not having achieved remission: Secondary | ICD-10-CM

## 2012-06-02 MED ORDER — SODIUM CHLORIDE 0.9 % IJ SOLN
10.0000 mL | Freq: Once | INTRAMUSCULAR | Status: AC
  Administered 2012-06-02: 10 mL via INTRAVENOUS
  Filled 2012-06-02: qty 10

## 2012-06-02 MED ORDER — SODIUM CHLORIDE 0.9 % IV SOLN
Freq: Once | INTRAVENOUS | Status: AC
  Administered 2012-06-02: 11:00:00 via INTRAVENOUS

## 2012-06-02 MED ORDER — PAMIDRONATE DISODIUM 30 MG/10ML IV SOLN
60.0000 mg | Freq: Once | INTRAVENOUS | Status: AC
  Administered 2012-06-02: 60 mg via INTRAVENOUS
  Filled 2012-06-02: qty 20

## 2012-06-02 NOTE — Progress Notes (Signed)
Tolerated well

## 2012-06-07 ENCOUNTER — Telehealth (HOSPITAL_COMMUNITY): Payer: Self-pay | Admitting: Oncology

## 2012-06-10 ENCOUNTER — Encounter: Payer: Self-pay | Admitting: Oncology

## 2012-06-24 ENCOUNTER — Other Ambulatory Visit (HOSPITAL_COMMUNITY): Payer: Self-pay | Admitting: Oncology

## 2012-06-24 DIAGNOSIS — C9000 Multiple myeloma not having achieved remission: Secondary | ICD-10-CM

## 2012-06-24 MED ORDER — POMALIDOMIDE 1 MG PO CAPS: ORAL_CAPSULE | ORAL | Status: DC

## 2012-06-28 ENCOUNTER — Telehealth (HOSPITAL_COMMUNITY): Payer: Self-pay | Admitting: Oncology

## 2012-07-08 ENCOUNTER — Encounter (HOSPITAL_COMMUNITY): Payer: PRIVATE HEALTH INSURANCE | Attending: Oncology

## 2012-07-08 DIAGNOSIS — C9002 Multiple myeloma in relapse: Secondary | ICD-10-CM

## 2012-07-08 DIAGNOSIS — C9000 Multiple myeloma not having achieved remission: Secondary | ICD-10-CM | POA: Insufficient documentation

## 2012-07-08 LAB — CBC WITH DIFFERENTIAL/PLATELET
Basophils Relative: 0 % (ref 0–1)
Eosinophils Absolute: 0.1 10*3/uL (ref 0.0–0.7)
Lymphs Abs: 2.1 10*3/uL (ref 0.7–4.0)
MCH: 35.1 pg — ABNORMAL HIGH (ref 26.0–34.0)
MCHC: 34 g/dL (ref 30.0–36.0)
Neutrophils Relative %: 48 % (ref 43–77)
Platelets: 58 10*3/uL — ABNORMAL LOW (ref 150–400)
RBC: 2.99 MIL/uL — ABNORMAL LOW (ref 4.22–5.81)

## 2012-07-08 LAB — COMPREHENSIVE METABOLIC PANEL
Albumin: 3.5 g/dL (ref 3.5–5.2)
Alkaline Phosphatase: 39 U/L (ref 39–117)
BUN: 53 mg/dL — ABNORMAL HIGH (ref 6–23)
Chloride: 108 mEq/L (ref 96–112)
GFR calc Af Amer: 24 mL/min — ABNORMAL LOW (ref >90)
Glucose, Bld: 98 mg/dL (ref 70–99)
Potassium: 4.2 mEq/L (ref 3.5–5.1)
Total Bilirubin: 0.4 mg/dL (ref 0.3–1.2)

## 2012-07-08 NOTE — Progress Notes (Signed)
Labs drawn today for cbc/diff,mm panel,kllc,cmp, crp

## 2012-07-11 LAB — KAPPA/LAMBDA LIGHT CHAINS: Kappa free light chain: 2.08 mg/dL — ABNORMAL HIGH (ref 0.33–1.94)

## 2012-07-12 LAB — MULTIPLE MYELOMA PANEL, SERUM
Albumin ELP: 66.8 % — ABNORMAL HIGH (ref 55.8–66.1)
Alpha-2-Globulin: 11.4 % (ref 7.1–11.8)
Beta Globulin: 6.4 % (ref 4.7–7.2)
IgA: 27 mg/dL — ABNORMAL LOW (ref 68–379)
IgM, Serum: 69 mg/dL (ref 41–251)
Total Protein: 6.1 g/dL (ref 6.0–8.3)

## 2012-07-14 ENCOUNTER — Encounter (HOSPITAL_BASED_OUTPATIENT_CLINIC_OR_DEPARTMENT_OTHER): Payer: PRIVATE HEALTH INSURANCE

## 2012-07-14 VITALS — BP 144/80 | HR 65 | Temp 97.4°F | Resp 20

## 2012-07-14 DIAGNOSIS — C9000 Multiple myeloma not having achieved remission: Secondary | ICD-10-CM

## 2012-07-14 DIAGNOSIS — C9002 Multiple myeloma in relapse: Secondary | ICD-10-CM

## 2012-07-14 MED ORDER — SODIUM CHLORIDE 0.9 % IJ SOLN
10.0000 mL | INTRAMUSCULAR | Status: DC | PRN
Start: 2012-07-14 — End: 2012-07-14
  Filled 2012-07-14: qty 10

## 2012-07-14 MED ORDER — SODIUM CHLORIDE 0.9 % IV SOLN: INTRAVENOUS | Status: DC

## 2012-07-14 MED ORDER — SODIUM CHLORIDE 0.9 % IV SOLN
60.0000 mg | Freq: Once | INTRAVENOUS | Status: AC
  Administered 2012-07-14: 60 mg via INTRAVENOUS
  Filled 2012-07-14: qty 20

## 2012-07-14 NOTE — Progress Notes (Signed)
Tolerated well

## 2012-07-21 ENCOUNTER — Other Ambulatory Visit (HOSPITAL_COMMUNITY): Payer: Self-pay | Admitting: Oncology

## 2012-07-21 DIAGNOSIS — C9000 Multiple myeloma not having achieved remission: Secondary | ICD-10-CM

## 2012-07-21 MED ORDER — POMALIDOMIDE 1 MG PO CAPS: ORAL_CAPSULE | ORAL | Status: DC

## 2012-07-22 ENCOUNTER — Ambulatory Visit (HOSPITAL_COMMUNITY): Payer: PRIVATE HEALTH INSURANCE | Admitting: Oncology

## 2012-07-26 ENCOUNTER — Encounter (HOSPITAL_COMMUNITY): Payer: PRIVATE HEALTH INSURANCE | Attending: Oncology | Admitting: Oncology

## 2012-07-26 ENCOUNTER — Encounter (HOSPITAL_COMMUNITY): Payer: Self-pay | Admitting: Oncology

## 2012-07-26 VITALS — BP 153/79 | HR 66 | Temp 98.1°F | Resp 20 | Wt 254.0 lb

## 2012-07-26 DIAGNOSIS — C9002 Multiple myeloma in relapse: Secondary | ICD-10-CM

## 2012-07-26 DIAGNOSIS — R609 Edema, unspecified: Secondary | ICD-10-CM

## 2012-07-26 DIAGNOSIS — C9000 Multiple myeloma not having achieved remission: Secondary | ICD-10-CM | POA: Insufficient documentation

## 2012-07-26 DIAGNOSIS — E249 Cushing's syndrome, unspecified: Secondary | ICD-10-CM

## 2012-07-26 MED ORDER — TRIAMTERENE-HCTZ 37.5-25 MG PO CAPS: ORAL_CAPSULE | ORAL | Status: DC

## 2012-07-26 MED ORDER — SPIRONOLACTONE 50 MG PO TABS: 50.0000 mg | ORAL_TABLET | Freq: Every day | ORAL | Status: DC

## 2012-07-26 MED ORDER — HYDROCODONE-ACETAMINOPHEN 5-325 MG PO TABS: 30.0000 | ORAL_TABLET | Freq: Four times a day (QID) | ORAL | Status: DC | PRN

## 2012-07-26 NOTE — Patient Instructions (Addendum)
[facility] Discharge Instructions  RECOMMENDATIONS MADE BY THE CONSULTANT AND ANY TEST RESULTS WILL BE SENT TO YOUR REFERRING PHYSICIAN.  EXAM FINDINGS BY THE PHYSICIAN TODAY AND SIGNS OR SYMPTOMS TO REPORT TO CLINIC OR PRIMARY PHYSICIAN: Exam and discussion by MD.  Bonita Quin are still in remission.  Will stop the decadron and continue the Pomalidomide 1 pill 7 days on and 7 days off as you are doing.  MEDICATIONS PRESCRIBED:  Spironolactone 50 mg daily Diazide take only on Mondays, Wednesdays and Fridays.  INSTRUCTIONS GIVEN AND DISCUSSED: Report uncontrolled pain or other problems.  SPECIAL INSTRUCTIONS/FOLLOW-UP: Need to do your Skelatal Survey before you see the PA.  Follow-up to see PA in 1 month.  Thank you for choosing [facility] to provide your oncology and hematology care.  To afford each patient quality time with our providers, please arrive at least 15 minutes before your scheduled appointment time.  With your help, our goal is to use those 15 minutes to complete the necessary work-up to ensure our physicians have the information they need to help with your evaluation and healthcare recommendations.    Effective January 1st, 2014, we ask that you re-schedule your appointment with our physicians should you arrive 10 or more minutes late for your appointment.  We strive to give you quality time with our providers, and arriving late affects you and other patients whose appointments are after yours.    Again, thank you for choosing [facility].  Our hope is that these requests will decrease the amount of time that you wait before being seen by our physicians.       _____________________________________________________________  Should you have questions after your visit to [facility], please contact our office at [phone] between the hours of 8:30 a.m. and 5:00 p.m.  Voicemails left after 4:30 p.m. will not be returned  until the following business day.  For prescription refill requests, have your pharmacy contact our office with your prescription refill request.

## 2012-07-26 NOTE — Progress Notes (Signed)
#  1 relapse multiple myeloma, kappa light chain disease, status post his second bone marrow transplant in July 2013 by Dr. Marlaine Hind at [facility] with an extra response. He is now on maintenance pomalidomide 1 mg a day, 7 days on, 7 days off. He is on one time a week dexamethasone 8 mg a day which is all that he could agree to. He comes in today very happy with weight gain 17 pounds since November, cushingoid facies, 2+ pitting edema of his pretibial areas, [place] pads, central abdominal obesity which clearly is worse. He is thin in his arms and legs. He is also weaker he states.  He does not have bone pain but has what appears to be muscle discomfort on the right flank close to the latissimus on the right only. On exam however he's tender on both sides more so on the right in the lower portion of the latissimus insertion.  Bowels are working well but he states he's miserable.  BP 153/79  Pulse 66  Temp(Src) 98.1 F (36.7 C) (Oral)  Resp 20  Wt 254 lb (115.214 kg)  BMI 35.92 kg/m2  He is in no acute distress. He clearly is cushingoid. Lungs though are clear. Heart shows no S3 gallop or murmur. I think he is starting to have subtle striae on his abdomen. His other findings are described above. He has no obvious hepatosplenomegaly. His abdomen is so obese I doubt that would feel it though. Bowel sounds are normal. He has no lymphadenopathy. He is very cushingoid however.  He is to stop the dexamethasone, continue the pomalidomide, take Aldactone 50 mg once a day, and Dyazide only on Mondays Wednesdays and Fridays (37.5/25 mg) and see [place] back here in one month. He will also get a bone density before he returns. I am not sure he is going to be willing to restart the dexamethasone.

## 2012-07-29 ENCOUNTER — Ambulatory Visit (HOSPITAL_COMMUNITY)
Admission: RE | Admit: 2012-07-29 | Discharge: 2012-07-29 | Disposition: A | Discharge status: Home / Self Care | Payer: PRIVATE HEALTH INSURANCE | Source: Ambulatory Visit | Attending: Oncology | Admitting: Oncology

## 2012-07-29 ENCOUNTER — Encounter (HOSPITAL_BASED_OUTPATIENT_CLINIC_OR_DEPARTMENT_OTHER): Payer: PRIVATE HEALTH INSURANCE | Admitting: Oncology

## 2012-07-29 ENCOUNTER — Telehealth (HOSPITAL_COMMUNITY): Payer: Self-pay | Admitting: Oncology

## 2012-07-29 DIAGNOSIS — C9000 Multiple myeloma not having achieved remission: Secondary | ICD-10-CM | POA: Insufficient documentation

## 2012-07-29 NOTE — Telephone Encounter (Signed)
Pt would like for you to call him [phone]

## 2012-07-29 NOTE — Progress Notes (Signed)
[name] came to the clinic today desiring to speak with Dr. Mariel Sleet of myself.  Fortunately I was available so I spoke with him at the clinic.   [name] is convinced that the Pomalyst is the cause of his weight gain and increased discomfort in UE.  We suspect it is more from the Dexamethasone and as a result, this was placed on hold, but Faye still wants to stop the Pomalyst.   As a result, we will hold Pomalyst x 6 weeks with the Dexamethasone and re-evaluate in a few weeks.  After 6 weeks, we will restart Pomalyst alone.   KEFALAS,THOMAS

## 2012-08-02 ENCOUNTER — Encounter: Payer: Self-pay | Admitting: Oncology

## 2012-08-04 ENCOUNTER — Encounter (HOSPITAL_COMMUNITY): Payer: Self-pay | Admitting: Oncology

## 2012-08-09 ENCOUNTER — Telehealth (HOSPITAL_COMMUNITY): Payer: Self-pay | Admitting: Oncology

## 2012-08-09 ENCOUNTER — Telehealth (HOSPITAL_COMMUNITY): Payer: Self-pay

## 2012-08-09 NOTE — Telephone Encounter (Signed)
Recommend continuing with BP medications and fluid pills as directed

## 2012-08-09 NOTE — Telephone Encounter (Signed)
Error

## 2012-08-09 NOTE — Telephone Encounter (Signed)
Ellouise Newer, PA-C at 08/09/2012 5:06 PM   Status: Signed            Recommend continuing with BP medications and fluid pills as directed        Anya A Waddell at 08/09/2012 10:39 AM    Status: Signed             Pt wanted to know if he should continue taking triamterene -hctz and spironolacton he thinks it is lowering his bloodpressure   Patient notified.  Stated " I will keep a check on my BP and if it gets too low I won't take the medications."  Encouraged to keep track of BP and let us know what his readings are.

## 2012-08-09 NOTE — Telephone Encounter (Signed)
Pt wanted to know if he should continue taking triamterene -hctz and spironolacton he thinks it is lowering his bloodpressure

## 2012-08-19 ENCOUNTER — Encounter (HOSPITAL_COMMUNITY): Payer: PRIVATE HEALTH INSURANCE | Attending: Oncology

## 2012-08-19 ENCOUNTER — Encounter (HOSPITAL_BASED_OUTPATIENT_CLINIC_OR_DEPARTMENT_OTHER): Payer: PRIVATE HEALTH INSURANCE | Admitting: Oncology

## 2012-08-19 ENCOUNTER — Telehealth (HOSPITAL_COMMUNITY): Payer: Self-pay

## 2012-08-19 DIAGNOSIS — I10 Essential (primary) hypertension: Secondary | ICD-10-CM

## 2012-08-19 DIAGNOSIS — C9000 Multiple myeloma not having achieved remission: Secondary | ICD-10-CM | POA: Insufficient documentation

## 2012-08-19 LAB — COMPREHENSIVE METABOLIC PANEL
ALT: 14 U/L (ref 0–53)
Albumin: 3.5 g/dL (ref 3.5–5.2)
Alkaline Phosphatase: 57 U/L (ref 39–117)
GFR calc Af Amer: 21 mL/min — ABNORMAL LOW (ref >90)
Glucose, Bld: 143 mg/dL — ABNORMAL HIGH (ref 70–99)
Potassium: 5.9 mEq/L — ABNORMAL HIGH (ref 3.5–5.1)
Sodium: 138 mEq/L (ref 135–145)
Total Protein: 6.4 g/dL (ref 6.0–8.3)

## 2012-08-19 LAB — CBC WITH DIFFERENTIAL/PLATELET
Eosinophils Absolute: 0.1 10*3/uL (ref 0.0–0.7)
Lymphs Abs: 2.1 10*3/uL (ref 0.7–4.0)
MCH: 35.4 pg — ABNORMAL HIGH (ref 26.0–34.0)
Neutro Abs: 2.6 10*3/uL (ref 1.7–7.7)
Neutrophils Relative %: 50 % (ref 43–77)
Platelets: 63 10*3/uL — ABNORMAL LOW (ref 150–400)
RBC: 2.71 MIL/uL — ABNORMAL LOW (ref 4.22–5.81)
WBC: 5.2 10*3/uL (ref 4.0–10.5)

## 2012-08-19 NOTE — Telephone Encounter (Signed)
Message left per request of Dellis Anes PA for patient to change his Lisinopril to 10 mg every other day.  To call back with any questions.

## 2012-08-19 NOTE — Progress Notes (Signed)
Labs drawn today for kllc,crp,mm panel,cbc/diff,cmp

## 2012-08-19 NOTE — Progress Notes (Signed)
[name] is seen as a walk-in  He complains of low blood pressure but this is not documented.  He reports that he is gaining weight but has not been eating much.  He was encouraged to be as active as possible, but he has not been able to lose weight because of decreased activity secondary to his neck and back pain.    He reports that he has been walking at the local mall and 4 laps = 1 mile.  He reports that he can walk about 1/4- 1/2 miles and then he needs to rest.  He thinks this secondary to hypotension.  I suspect it is more from deconditioning than anything else.    He denies any orthostatic symptoms.   He tries to describe to me the appearance of the pills he is taking daily but that is futile as I decipher the names of the pills by their appearance.  He was asked to leave the clinic while I call his pharmacy to retrieve the medications he is getting from them.  He was informed that nurses will call him.  Nurses called him with instructions, but he came back to the clinic with a bag of medications he is taking.  As a result, I saw him again.  He is taking 2 different kinds of fish oil.  He is taking flomax.  He is taking lisinopril, gabapentin, and Cardizem ER.    He reports that he is taking Cardizem since his bone marrow at [facility].  In addition to that he is taking Lisinopril prescribed by Dr. Kristian Covey.  I have asked him to hold the Cardizem and continue with Lisinopril daily as this will be reno-protective for him.  He was urged to contact Dr. Susa Griffins office and PCP for follow-up appointments as we focus on his multiple myeloma and we would like to defer other medical issues to his other medical physicians in their respective specialty.   Patient and plan discussed with Dr. [name] and he is in agreement with the aforementioned.   KEFALAS,THOMAS

## 2012-08-22 ENCOUNTER — Other Ambulatory Visit (HOSPITAL_COMMUNITY): Payer: Self-pay | Admitting: Oncology

## 2012-08-22 DIAGNOSIS — C9000 Multiple myeloma not having achieved remission: Secondary | ICD-10-CM

## 2012-08-22 MED ORDER — POMALIDOMIDE 1 MG PO CAPS: ORAL_CAPSULE | ORAL | Status: DC

## 2012-08-23 ENCOUNTER — Encounter (HOSPITAL_BASED_OUTPATIENT_CLINIC_OR_DEPARTMENT_OTHER): Payer: PRIVATE HEALTH INSURANCE | Admitting: Oncology

## 2012-08-23 ENCOUNTER — Encounter (HOSPITAL_COMMUNITY): Payer: Self-pay | Admitting: Oncology

## 2012-08-23 VITALS — BP 123/77 | HR 70 | Temp 98.5°F | Resp 20 | Wt 252.1 lb

## 2012-08-23 DIAGNOSIS — C9000 Multiple myeloma not having achieved remission: Secondary | ICD-10-CM

## 2012-08-23 LAB — MULTIPLE MYELOMA PANEL, SERUM
Albumin ELP: 66.1 % (ref 55.8–66.1)
IgA: 39 mg/dL — ABNORMAL LOW (ref 68–379)
IgM, Serum: 67 mg/dL (ref 41–251)
Total Protein: 6 g/dL (ref 6.0–8.3)

## 2012-08-23 NOTE — Patient Instructions (Addendum)
[facility] Discharge Instructions  RECOMMENDATIONS MADE BY THE CONSULTANT AND ANY TEST RESULTS WILL BE SENT TO YOUR REFERRING PHYSICIAN. Start Pomalyst April 21st. Continue lab work every 6 weeks. Follow up with Dr.Befekadu as scheduled. Return to clinic in 6 weeks to see MD. Report any issues/concerns to clinic as needed prior to appointments.  Thank you for choosing [name] to provide your oncology and hematology care.  To afford each patient quality time with our providers, please arrive at least 15 minutes before your scheduled appointment time.  With your help, our goal is to use those 15 minutes to complete the necessary work-up to ensure our physicians have the information they need to help with your evaluation and healthcare recommendations.    Effective January 1st, 2014, we ask that you re-schedule your appointment with our physicians should you arrive 10 or more minutes late for your appointment.  We strive to give you quality time with our providers, and arriving late affects you and other patients whose appointments are after yours.    Again, thank you for choosing [facility].  Our hope is that these requests will decrease the amount of time that you wait before being seen by our physicians.       _____________________________________________________________  Should you have questions after your visit to [facility], please contact our office at [phone] between the hours of 8:30 a.m. and 5:00 p.m.  Voicemails left after 4:30 p.m. will not be returned until the following business day.  For prescription refill requests, have your pharmacy contact our office with your prescription refill request.

## 2012-08-23 NOTE — Progress Notes (Signed)
Gregory Ruths, MD [address] [place] [address]  Multiple myeloma  CURRENT THERAPY: Pomalyst and Dexamethasone on Hold  INTERVAL HISTORY: [name] 67 y.o. 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 Greggory Stallion at [facility].  About 4 weeks ago [name] had complaints which he attributed to the Pomalyst.  We held the [facility] for 6 weeks.  It has been 4 weeks since we held that and [name] is feeling better.  He wants to wait the full 6 weeks until he restarts.  Therefore he will restart on April 21 Pomalyst 7 days on and 7 days off.  We will hold the Dexamethasone as we suspect the main culprit of his complaints is secondary to steroids.    He has an appointment with Dr. Kristian Covey coming up next month that he made at our request due to his worsening renal function.  He has a list of BPs he took at home.  His best reading was nearly 140/80.  His worst number was 175/75.  Interestingly though, his BP is perfect at the clinic at 123/77.  He was taking Lisinopril and Cardizem ER. I asked him to hold the Cardizem about 1 week ago and continue with Lisinopril 10 mg daily.  He did not know the Cardizem was a BP medication.  He is taking his BP at home on a portable BP machine that is about 74 years old or more.  I have asked him to bring his BP machine to the clinic next time and compare his values on that machine compared to our BP machine.  So he will restart his Pomalyst on April 21 and we will continue to hold Dexamethasone.   I personally reviewed and went over laboratory results with the patient.  Hematologically, he denies any complaints and ROS questioning is negative.    Past Medical History  Diagnosis Date  . Hypertension   . Multiple myeloma   . Ruptured cervical disc   . Fall resulting in striking against other object     paralyzed   . 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 Multiple myeloma 01/26/2011    has ESSENTIAL HYPERTENSION, BENIGN; BRADYCARDIA; and Multiple myeloma on his problem list.     has No Known Allergies.  [name] does not currently have medications on file.  Past Surgical History  Procedure Laterality Date  . Inguinal hernia repair    . Anterior cervical decomp/discectomy fusion    . Bone marrow aspirate and biopsy wiith lumbar puncture    . Melanoma excision      rt. breast  . Cervical spine surgery      Denies any headaches, dizziness, double vision, fevers, chills, night sweats, nausea, vomiting, diarrhea, constipation, chest pain, heart palpitations, shortness of breath, blood in stool, black tarry stool, urinary pain, urinary burning, urinary frequency, hematuria.   PHYSICAL EXAMINATION  ECOG PERFORMANCE STATUS: 1 - Symptomatic but completely ambulatory  Filed Vitals:   08/23/12 1500  BP: 123/77  Pulse: 70  Temp: 98.5 F (36.9 C)  Resp: 20    GENERAL:alert, no distress, well nourished, well developed, comfortable, cooperative, obese and smiling SKIN: skin color, texture, turgor are normal, no rashes or significant lesions HEAD: Normocephalic, No masses, lesions, tenderness or abnormalities EYES: normal, Conjunctiva are pink and non-injected EARS: External ears normal OROPHARYNX:mucous  membranes are moist  NECK: supple, trachea midline LYMPH:  no palpable lymphadenopathy BREAST:not examined LUNGS: clear to auscultation and percussion HEART: regular rate & rhythm, no murmurs, no gallops, S1 normal and S2 normal ABDOMEN:abdomen soft, non-tender, obese and normal bowel sounds BACK: Back symmetric, no curvature. EXTREMITIES:less then 2 second capillary refill, no joint deformities, effusion, or inflammation, no skin discoloration, no clubbing, no cyanosis  NEURO: alert & oriented x 3 with fluent speech, no focal motor/sensory deficits, gait  normal    LABORATORY DATA: Results for [name], FINNAN (MRN [id number]) as of 08/23/2012 16:21  Ref. Range 08/19/2012 10:00 08/19/2012 10:00  Albumin ELP Latest Range: 55.8-66.1 %  66.1  Alpha-1-Globulin Latest Range: 2.9-4.9 %  4.8  Alpha-2-Globulin Latest Range: 7.1-11.8 %  11.4  Beta Globulin Latest Range: 4.7-7.2 %  6.7  Beta 2 Latest Range: 3.2-6.5 %  3.6  Gamma Globulin Latest Range: 11.1-18.8 %  7.4 (L)  M-SPIKE, % No range found  NOT DETECTED  SPE Interp. No range found  (NOTE)  Comment No range found  (NOTE)  IgG (Immunoglobin G), Serum Latest Range: 8153108202 mg/dL  308 (L)  IgA Latest Range: 68-379 mg/dL  39 (L)  IgM, Serum Latest Range: 41-251 mg/dL  67  Kappa free light chain Latest Range: 0.33-1.94 mg/dL 6.57 (H)   Lamda free light chains Latest Range: 0.57-2.63 mg/dL 8.46   Kappa, lamda light chain ratio Latest Range: 0.26-1.65  1.68 (H)   WBC Latest Range: 4.0-10.5 K/uL 5.2   RBC Latest Range: 4.22-5.81 MIL/uL 2.71 (L)   Hemoglobin Latest Range: 13.0-17.0 g/dL 9.6 (L)   HCT Latest Range: 39.0-52.0 % 27.5 (L)   MCV Latest Range: 78.0-100.0 fL 101.5 (H)   MCH Latest Range: 26.0-34.0 pg 35.4 (H)   MCHC Latest Range: 30.0-36.0 g/dL 96.2   RDW Latest Range: 11.5-15.5 % 13.5   Platelets Latest Range: 150-400 K/uL 63 (L)   Neutrophils Relative Latest Range: 43-77 % 50   Lymphocytes Relative Latest Range: 12-46 % 41   Monocytes Relative Latest Range: 3-12 % 7   Eosinophils Relative Latest Range: 0-5 % 2   Basophils Relative Latest Range: 0-1 % 0   NEUT# Latest Range: 1.7-7.7 K/uL 2.6   Lymphocytes Absolute Latest Range: 0.7-4.0 K/uL 2.1   Monocytes Absolute Latest Range: 0.1-1.0 K/uL 0.4   Eosinophils Absolute Latest Range: 0.0-0.7 K/uL 0.1   Basophils Absolute Latest Range: 0.0-0.1 K/uL 0.0   Glucose Latest Range: 70-99 mg/dL 952 (H)       ASSESSMENT:  1.  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  Greggory Stallion at [facility]. He will restart his Pomalyst on September 05, 2012, 7 days on and 7 days off.  Will hold Dexamethasone.   PLAN:  1. I personally reviewed and went over laboratory results with the patient. 2. Encouraged him to follow-up with Nephrologist on May 19 as scheduled for renal function. 3. Hold Cardizem 4. Continue Lisinopril daily 5. Compare his BP cuff to our machine on his next visit. 6. Restart Pomalyst on September 05, 2012. 7. Labs in 6 weeks 8. Return in 6 weeks for follow-up.   All questions were answered. The patient knows to call the clinic with any problems, questions or concerns. We can certainly see the patient much sooner if necessary.  The patient and plan discussed with [name], MD and he is in agreement with the aforementioned.  Fayez Sturgell

## 2012-08-24 NOTE — Progress Notes (Signed)
Make sure he is taking his Kayexalate.  If he needs a new Rx let me know.  His kidney doctor should manage this medication in the future along with his BP medications.

## 2012-08-25 ENCOUNTER — Encounter (HOSPITAL_BASED_OUTPATIENT_CLINIC_OR_DEPARTMENT_OTHER): Payer: PRIVATE HEALTH INSURANCE

## 2012-08-25 VITALS — BP 133/87 | HR 64 | Temp 98.2°F | Resp 18

## 2012-08-25 DIAGNOSIS — C9002 Multiple myeloma in relapse: Secondary | ICD-10-CM

## 2012-08-25 DIAGNOSIS — C9000 Multiple myeloma not having achieved remission: Secondary | ICD-10-CM

## 2012-08-25 MED ORDER — SODIUM CHLORIDE 0.9 % IV SOLN
Freq: Once | INTRAVENOUS | Status: AC
  Administered 2012-08-25: 11:00:00 via INTRAVENOUS

## 2012-08-25 MED ORDER — PAMIDRONATE DISODIUM 30 MG/10ML IV SOLN
60.0000 mg | Freq: Once | INTRAVENOUS | Status: AC
  Administered 2012-08-25: 60 mg via INTRAVENOUS
  Filled 2012-08-25: qty 20

## 2012-08-25 NOTE — Progress Notes (Signed)
Tolerated well

## 2012-09-12 ENCOUNTER — Telehealth (HOSPITAL_COMMUNITY): Payer: Self-pay | Admitting: Oncology

## 2012-09-13 ENCOUNTER — Telehealth (HOSPITAL_COMMUNITY): Payer: Self-pay | Admitting: Oncology

## 2012-09-13 NOTE — Telephone Encounter (Signed)
[name] reached out to voice 2 concerns about (1) generalized weakness and fatigue worsening x the past couple of weeks - he reports his last Hgb was 9.5 and wanted to know whether he would benefit from going back on ESAs until his hgb improves.  He states he does not want to get to the point of needing a blood transfusion.  [name] also reports that he is not taking Sprinolactone (only took 4 doses and stopped b/c he states his nephrologist reported it had KCL in it) - [name] reports (2) persistent weight gain, and he wants to be switched to a different diuretic.

## 2012-09-14 ENCOUNTER — Other Ambulatory Visit (HOSPITAL_COMMUNITY): Payer: Self-pay | Admitting: Oncology

## 2012-09-14 ENCOUNTER — Telehealth (HOSPITAL_COMMUNITY): Payer: Self-pay | Admitting: Oncology

## 2012-09-14 NOTE — Telephone Encounter (Signed)
Per Dr. Mariel Sleet, patient can be started back on Aranesp.  He was referred back to Dr. Kristian Covey or Dr. Regino Schultze, his preference, to manage his primary care type issues, such as fluid retention.  Patient verbalized understanding of instruction - he will be contacted with an appt. for Aranesp.

## 2012-09-15 ENCOUNTER — Encounter (HOSPITAL_COMMUNITY): Payer: PRIVATE HEALTH INSURANCE | Attending: Oncology

## 2012-09-15 DIAGNOSIS — D638 Anemia in other chronic diseases classified elsewhere: Secondary | ICD-10-CM

## 2012-09-15 DIAGNOSIS — N289 Disorder of kidney and ureter, unspecified: Secondary | ICD-10-CM

## 2012-09-15 DIAGNOSIS — C9000 Multiple myeloma not having achieved remission: Secondary | ICD-10-CM | POA: Insufficient documentation

## 2012-09-15 MED ORDER — DARBEPOETIN ALFA-POLYSORBATE 500 MCG/ML IJ SOLN
500.0000 ug | Freq: Once | INTRAMUSCULAR | Status: AC
  Administered 2012-09-15: 500 ug via SUBCUTANEOUS

## 2012-09-15 NOTE — Progress Notes (Signed)
[name] presents today for injection per MD orders. Aranesp 500 mcg administered SQ in rightt Abdomen. Administration without incident. Patient tolerated well.

## 2012-09-26 ENCOUNTER — Other Ambulatory Visit (HOSPITAL_COMMUNITY): Payer: Self-pay | Admitting: Oncology

## 2012-09-26 DIAGNOSIS — C9000 Multiple myeloma not having achieved remission: Secondary | ICD-10-CM

## 2012-09-26 MED ORDER — POMALIDOMIDE 1 MG PO CAPS: ORAL_CAPSULE | ORAL | Status: DC

## 2012-10-01 ENCOUNTER — Encounter: Payer: Self-pay | Admitting: Oncology

## 2012-10-04 ENCOUNTER — Encounter (HOSPITAL_BASED_OUTPATIENT_CLINIC_OR_DEPARTMENT_OTHER): Payer: PRIVATE HEALTH INSURANCE

## 2012-10-04 DIAGNOSIS — C9000 Multiple myeloma not having achieved remission: Secondary | ICD-10-CM

## 2012-10-04 LAB — COMPREHENSIVE METABOLIC PANEL
Alkaline Phosphatase: 52 U/L (ref 39–117)
Calcium: 8.3 mg/dL — ABNORMAL LOW (ref 8.4–10.5)
Creatinine, Ser: 4.17 mg/dL — ABNORMAL HIGH (ref 0.50–1.35)
GFR calc non Af Amer: 14 mL/min — ABNORMAL LOW (ref >90)
Glucose, Bld: 112 mg/dL — ABNORMAL HIGH (ref 70–99)
Sodium: 143 mEq/L (ref 135–145)
Total Bilirubin: 0.4 mg/dL (ref 0.3–1.2)
Total Protein: 6.9 g/dL (ref 6.0–8.3)

## 2012-10-04 LAB — CBC WITH DIFFERENTIAL/PLATELET
Basophils Absolute: 0 10*3/uL (ref 0.0–0.1)
Eosinophils Absolute: 0.1 10*3/uL (ref 0.0–0.7)
Eosinophils Relative: 2 % (ref 0–5)
MCH: 34.1 pg — ABNORMAL HIGH (ref 26.0–34.0)
MCV: 103.9 fL — ABNORMAL HIGH (ref 78.0–100.0)
Platelets: 45 10*3/uL — ABNORMAL LOW (ref 150–400)
RDW: 14.8 % (ref 11.5–15.5)

## 2012-10-04 LAB — C-REACTIVE PROTEIN: CRP: <0.5 mg/dL — ABNORMAL LOW (ref <0.60)

## 2012-10-04 NOTE — Progress Notes (Signed)
Labs drawn today for crp,cbc/diff,cmp,kllc,mm panel

## 2012-10-05 LAB — KAPPA/LAMBDA LIGHT CHAINS: Kappa, lambda light chain ratio: 1.39 (ref 0.26–1.65)

## 2012-10-06 ENCOUNTER — Ambulatory Visit (HOSPITAL_COMMUNITY): Payer: PRIVATE HEALTH INSURANCE

## 2012-10-06 LAB — MULTIPLE MYELOMA PANEL, SERUM
IgA: 61 mg/dL — ABNORMAL LOW (ref 68–379)
IgG (Immunoglobin G), Serum: 639 mg/dL — ABNORMAL LOW (ref 650–1600)
M-Spike, %: NOT DETECTED g/dL
Total Protein: 6.4 g/dL (ref 6.0–8.3)

## 2012-10-11 NOTE — Progress Notes (Signed)
Gregory Ruths, MD [address] [place] [address]  Multiple myeloma - Plan: CBC with Differential, Comprehensive metabolic panel, Multiple myeloma panel, serum, Kappa/lambda light chains  CURRENT THERAPY: Pomalyst 7 days on and 7 days off restarting on September 05, 2012 and holding Dexamethasone.  INTERVAL HISTORY: [name] 67 y.o. 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 Greggory Stallion at [facility].  He is following up with Dr. Kristian Covey for his renal function.  His PCP with the assistance of nephrology are managing the patient's BP medications.   It was re-iterated to [name] that our role is managing of his multiple myeloma.  All other chronic issues will require follow-up with PCP.  Today in the clinic, [name] was receiving his monthly Pamidronate, dosed according to his renal function.  After approximately 22-25 cc of medication, he started to have difficulty breathing and showing signs of a reaction to the medication.  As a result, the infusion was stopped and NS was run wide open.  25 mg of benadryl IV and 100 mg of Solu-Cortef IV were administered.  He was noted to be hypotensive.  He did recover with the aforementioned interventions and his vitals have been monitored closely.  He received 1 L of NS. During reaction, heart sounds were distant.  Since recovery, we had the opportunity to talk some.  He reports that he had a nice Memorial Day weekend.  He had 2 singing gigs and he reports that he received a lot of complements of his vocal abilities.   He reports that he feels better now.  I have educated him that we will keep him in the clinic for the next 2 hours or so and make sure he is stable for discharge.   I personally reviewed and went over laboratory results with the patient.  Although his Kappa free light chains continue to increase,  along with his Lambda free light chains, his Kappa:Lambda light chain ratio remains stable.  He is tolerating his Pomalyst 7 days on and 7 days off well, and he was encouraged to continue with this regimen.  He will continue to hold the Dexamethasone.    Due to his reaction to Pamidronate and his worsening renal function, Pamidronate and Zometa cannot be used.  Due to his CrCl being less than 30, Xgeva is contraindicated as well (and is not indicated for multiple myeloma at this time).  As a result, we will D/C this particular aspect of his therapy.      Past Medical History  Diagnosis Date  . Hypertension   . Multiple myeloma   . Ruptured cervical disc   . Fall resulting in striking against other object     paralyzed  . 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 Multiple myeloma 01/26/2011    has ESSENTIAL HYPERTENSION, BENIGN; BRADYCARDIA; and Multiple myeloma on his problem list.     is allergic to pamidronate.  [name] does not currently have medications on file.  Past Surgical History  Procedure Laterality Date  . Inguinal hernia repair    . Anterior cervical decomp/discectomy fusion    . Bone marrow aspirate and biopsy wiith lumbar puncture    . Melanoma excision      rt. breast  . Cervical spine surgery      Denies any headaches, dizziness, double  vision, fevers, chills, night sweats, nausea, vomiting, diarrhea, constipation, chest pain, heart palpitations, shortness of breath, blood in stool, black tarry stool, urinary pain, urinary burning, urinary frequency, hematuria.   PHYSICAL EXAMINATION  ECOG PERFORMANCE STATUS: 1 - Symptomatic but completely ambulatory  There were no vitals filed for this visit.  GENERAL:alert, no distress, well nourished, well developed, comfortable, cooperative, obese and smiling SKIN: skin color, texture, turgor are normal, no rashes or significant lesions HEAD: Normocephalic, No masses, lesions, tenderness  or abnormalities EYES: normal, Conjunctiva are pink and non-injected EARS: External ears normal OROPHARYNX:mucous membranes are moist  NECK: supple, trachea midline LYMPH:  not examined BREAST:not examined LUNGS: clear to auscultation and percussion HEART: regular rate & rhythm, no murmurs, no gallops, S1 normal and S2 normal ABDOMEN:abdomen soft, non-tender, obese and normal bowel sounds BACK: Back symmetric, no curvature., No CVA tenderness EXTREMITIES:less then 2 second capillary refill, no joint deformities, effusion, or inflammation, no skin discoloration, no clubbing, no cyanosis  NEURO: alert & oriented x 3 with fluent speech, no focal motor/sensory deficits, gait normal    LABORATORY DATA: Results for [name], [name] (MRN [id number]) as of 10/11/2012 21:53  Ref. Range 10/04/2012 09:14 10/04/2012 09:14  Sodium Latest Range: 135-145 mEq/L 143   Potassium Latest Range: 3.5-5.1 mEq/L 5.3 (H)   Chloride Latest Range: 96-112 mEq/L 109   CO2 Latest Range: 19-32 mEq/L 22   BUN Latest Range: 6-23 mg/dL 64 (H)   Creatinine Latest Range: 0.50-1.35 mg/dL 1.02 (H)   Calcium Latest Range: 8.4-10.5 mg/dL 8.3 (L)   GFR calc non Af Amer Latest Range: >90 mL/min 14 (L)   GFR calc Af Amer Latest Range: >90 mL/min 16 (L)   Glucose Latest Range: 70-99 mg/dL 725 (H)   Alkaline Phosphatase Latest Range: 39-117 U/L 52   Albumin Latest Range: 3.5-5.2 g/dL 3.5   AST Latest Range: 0-37 U/L 24   ALT Latest Range: 0-53 U/L 20   Total Protein Latest Range: 6.0-8.3 g/dL 6.9 6.4  Total Bilirubin Latest Range: 0.3-1.2 mg/dL 0.4   CRP Latest Range: <0.60 mg/dL <3.6 (L)   Albumin ELP Latest Range: 55.8-66.1 %  66.5 (H)  Alpha-1-Globulin Latest Range: 2.9-4.9 %  4.5  Alpha-2-Globulin Latest Range: 7.1-11.8 %  10.0  Beta Globulin Latest Range: 4.7-7.2 %  6.3  Beta 2 Latest Range: 3.2-6.5 %  3.2  Gamma Globulin Latest Range: 11.1-18.8 %  9.5 (L)  M-SPIKE, % No range found  NOT DETECTED  SPE Interp. No  range found  (NOTE)  Comment No range found  (NOTE)  IgG (Immunoglobin G), Serum Latest Range: 281-578-3723 mg/dL  644 (L)  IgA Latest Range: 68-379 mg/dL  61 (L)  IgM, Serum Latest Range: 41-251 mg/dL  85  Kappa free light chain Latest Range: 0.33-1.94 mg/dL 0.34 (H)   Lamda free light chains Latest Range: 0.57-2.63 mg/dL 7.42 (H)   Kappa, lamda light chain ratio Latest Range: 0.26-1.65  1.39   WBC Latest Range: 4.0-10.5 K/uL 3.5 (L)   RBC Latest Range: 4.22-5.81 MIL/uL 3.08 (L)   Hemoglobin Latest Range: 13.0-17.0 g/dL 59.5 (L)   HCT Latest Range: 39.0-52.0 % 32.0 (L)   MCV Latest Range: 78.0-100.0 fL 103.9 (H)   MCH Latest Range: 26.0-34.0 pg 34.1 (H)   MCHC Latest Range: 30.0-36.0 g/dL 63.8   RDW Latest Range: 11.5-15.5 % 14.8   Platelets Latest Range: 150-400 K/uL 45 (L)   Neutrophils Relative % Latest Range: 43-77 % 39 (L)   Lymphocytes Relative Latest Range:  12-46 % 51 (H)   Monocytes Relative Latest Range: 3-12 % 8   Eosinophils Relative Latest Range: 0-5 % 2   Basophils Relative Latest Range: 0-1 % 0   NEUT# Latest Range: 1.7-7.7 K/uL 1.4 (L)   Lymphocytes Absolute Latest Range: 0.7-4.0 K/uL 1.8   Monocytes Absolute Latest Range: 0.1-1.0 K/uL 0.3   Eosinophils Absolute Latest Range: 0.0-0.7 K/uL 0.1   Basophils Absolute Latest Range: 0.0-0.1 K/uL 0.0       ASSESSMENT:  1. 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 Greggory Stallion at [facility]. Pomalyst restarted on September 05, 2012, 7 days on and 7 days off. Will hold Dexamethasone. 2. Worsening renal function, followed by nephrology  Patient Active Problem List   Diagnosis Date Noted  . Multiple myeloma 01/26/2011  . ESSENTIAL HYPERTENSION, BENIGN 03/05/2010  . BRADYCARDIA 03/05/2010     PLAN:  1. I personally reviewed and went over laboratory results with the patient. 2. Continue with Pomalyst 7 days on and 7 days off. 3. Pamidronate supportive therapy  plan deleted due to reaction 4. Discussed case with Pharmacist.  Zometa is contraindicated due to renal function.  Xgeva (although off-label) is also contraindicated due to CrCl less than 30.  5. Reaction to Pamidronate requiring  A. 25 mg IV Benadryl  B. 100 mg IV Solu-Cortef  C. 1 L NS  4. Vital monitoring 6. Defer renal function to nephrology. 7. Will keep the patient in the clinic until later this afternoon 8. Lab work every 8 weeks: CBC diff, CMET, MM panel 9. Continue Aranesp every 3 weeks as indicated by lab results 10. Return in 8 weeks for follow-up.   All questions were answered. The patient knows to call the clinic with any problems, questions or concerns. We can certainly see the patient much sooner if necessary.  The patient and plan discussed with [name], MD and he is in agreement with the aforementioned.  KEFALAS,THOMAS

## 2012-10-12 ENCOUNTER — Encounter (HOSPITAL_BASED_OUTPATIENT_CLINIC_OR_DEPARTMENT_OTHER): Payer: PRIVATE HEALTH INSURANCE

## 2012-10-12 ENCOUNTER — Encounter (HOSPITAL_BASED_OUTPATIENT_CLINIC_OR_DEPARTMENT_OTHER): Payer: PRIVATE HEALTH INSURANCE | Admitting: Oncology

## 2012-10-12 VITALS — BP 117/63 | HR 54 | Temp 98.0°F | Resp 16

## 2012-10-12 DIAGNOSIS — C9000 Multiple myeloma not having achieved remission: Secondary | ICD-10-CM

## 2012-10-12 DIAGNOSIS — C9002 Multiple myeloma in relapse: Secondary | ICD-10-CM

## 2012-10-12 DIAGNOSIS — D649 Anemia, unspecified: Secondary | ICD-10-CM

## 2012-10-12 MED ORDER — DIPHENHYDRAMINE HCL 50 MG/ML IJ SOLN
INTRAMUSCULAR | Status: AC
  Filled 2012-10-12: qty 1

## 2012-10-12 MED ORDER — SODIUM CHLORIDE 0.9 % IV SOLN
60.0000 mg | Freq: Once | INTRAVENOUS | Status: AC
  Administered 2012-10-12: 60 mg via INTRAVENOUS
  Filled 2012-10-12: qty 20

## 2012-10-12 MED ORDER — SODIUM CHLORIDE 0.9 % IJ SOLN
10.0000 mL | INTRAMUSCULAR | Status: DC | PRN
  Administered 2012-10-12: 10 mL
  Filled 2012-10-12: qty 10

## 2012-10-12 MED ORDER — SODIUM CHLORIDE 0.9 % IV SOLN
Freq: Once | INTRAVENOUS | Status: AC
  Administered 2012-10-12: 11:00:00 via INTRAVENOUS

## 2012-10-12 MED ORDER — DIPHENHYDRAMINE HCL 50 MG/ML IJ SOLN
25.0000 mg | INTRAMUSCULAR | Status: AC
  Administered 2012-10-12: 12:00:00 via INTRAVENOUS

## 2012-10-12 MED ORDER — DARBEPOETIN ALFA-POLYSORBATE 500 MCG/ML IJ SOLN
INTRAMUSCULAR | Status: AC
  Filled 2012-10-12: qty 1

## 2012-10-12 MED ORDER — DARBEPOETIN ALFA-POLYSORBATE 500 MCG/ML IJ SOLN
500.0000 ug | Freq: Once | INTRAMUSCULAR | Status: AC
  Administered 2012-10-12: 500 ug via SUBCUTANEOUS

## 2012-10-12 MED ORDER — HYDROCORTISONE SOD SUCCINATE 100 MG IJ SOLR
INTRAMUSCULAR | Status: AC
  Filled 2012-10-12: qty 2

## 2012-10-12 MED ORDER — HYDROCORTISONE SOD SUCCINATE 100 MG IJ SOLR
100.0000 mg | INTRAMUSCULAR | Status: AC
  Administered 2012-10-12: 12:00:00 via INTRAVENOUS

## 2012-10-12 NOTE — Progress Notes (Signed)
1125 Pt reports feeling "blurred vision" and "stinging" of his eyes apx 10 mins after starting Pamidronate. Infusion stopped. VS check. Pt was hypotensive and bradycardic. IV NS at 900 cc/hr. Informed Dr.Neijstrom of adverse reaction. V.O. For benadryl 25 mg IV push STAT and solucortef 100 mg IV push STAT. Medications given as ordered. 1140 Pt reports no longer having blurred vision but still feels like he is going to "pass out". MD in to assess pt. Will hydrate with 1 liter NS.  1145 Pt states "I'm ok". Pt still hypotensive and bradycardic. 1200 remains hypotensive and bradycardic 1215 pt sleeping. 1230 pt sleeping. 1300 T.Kefalas, PA in to assess patient. 1400 pt reports feeling ok. 1500 pt reports he feels ok. Pt is steady on his feet. Denies any orthostatic complaints. Pt released from clinic. He is to report any further problems to this clinic as needed.

## 2012-10-14 ENCOUNTER — Other Ambulatory Visit (HOSPITAL_COMMUNITY): Payer: Self-pay | Admitting: Oncology

## 2012-10-14 DIAGNOSIS — T458X5D Adverse effect of other primarily systemic and hematological agents, subsequent encounter: Secondary | ICD-10-CM

## 2012-10-14 DIAGNOSIS — T458X5A Adverse effect of other primarily systemic and hematological agents, initial encounter: Secondary | ICD-10-CM | POA: Insufficient documentation

## 2012-10-21 ENCOUNTER — Other Ambulatory Visit (HOSPITAL_COMMUNITY): Payer: Self-pay | Admitting: Oncology

## 2012-10-21 DIAGNOSIS — C9000 Multiple myeloma not having achieved remission: Secondary | ICD-10-CM

## 2012-10-21 MED ORDER — POMALIDOMIDE 1 MG PO CAPS: ORAL_CAPSULE | ORAL | Status: DC

## 2012-10-25 ENCOUNTER — Encounter (HOSPITAL_COMMUNITY): Payer: PRIVATE HEALTH INSURANCE | Attending: Oncology | Admitting: Oncology

## 2012-10-25 DIAGNOSIS — C9000 Multiple myeloma not having achieved remission: Secondary | ICD-10-CM

## 2012-10-25 DIAGNOSIS — R5381 Other malaise: Secondary | ICD-10-CM

## 2012-10-25 DIAGNOSIS — C9002 Multiple myeloma in relapse: Secondary | ICD-10-CM

## 2012-10-26 NOTE — Progress Notes (Signed)
Corion walked into the clinic on his own accord and demanded to see a provider.  He was loud and obnoxious as usual.  The nurses offered to make him an appointment but he declined and demanded to see a provider.   He is seen as a work-in today.  He reports that he is having fatigued and he has "figured out the cause....the chemo pill!"  He said that he asked the nurse provided to him from the specialty pharmacy that delivers his Pomalyst if that could cause his fatigue and she reported that it could.   In Up-to-date, Pomalyst is listed to have fatigue as a side effect with the occurrence rate of 55%.   I tried to explain to Sharrieff that this fatigue is a relative side effect as many patient's who experience fatigue are secondary to the disease and past treatments.  Although I tried to explain, this, Ilian was not interested in hearing this information.  Instead, he wanted to blame the medication for his fatigue.  We went over the pharmacokinetics of the medication.  Pomalyst has a peak effect in 2-3 hours and a half life of 7.5 hours in multiple myeloma patients.   He started his week break on Sunday so he is nearly 72 hours from taking his last dose.  He continues to report fatigue to date. We reviewed the information described above about half life of the medication.  "Is there anything else you could give me to treat my cancer?"  At this time, Pomalyst is his best choice particularly due to renal function.   PLAN:  Seichi is going to take an additional week break from therapy.  As a result, he will restart on 11/07/2012. He will call us next week to update [place] on his fatigue.  I will defer any other changes in his therapy plan to Dr. Mariel Sleet.   More than 50% of the time spent with the patient was utilized for counseling and coordination of care.  Patient and plan discussed with Dr. Erline Hau and he is in agreement with the aforementioned.

## 2012-10-27 ENCOUNTER — Ambulatory Visit (HOSPITAL_COMMUNITY): Payer: PRIVATE HEALTH INSURANCE

## 2012-10-27 ENCOUNTER — Other Ambulatory Visit (HOSPITAL_COMMUNITY): Payer: PRIVATE HEALTH INSURANCE

## 2012-11-01 ENCOUNTER — Other Ambulatory Visit (HOSPITAL_COMMUNITY): Payer: Self-pay | Admitting: Oncology

## 2012-11-01 DIAGNOSIS — C9000 Multiple myeloma not having achieved remission: Secondary | ICD-10-CM

## 2012-11-01 MED ORDER — GABAPENTIN 300 MG PO CAPS: 600.0000 mg | ORAL_CAPSULE | Freq: Every day | ORAL | Status: DC

## 2012-11-04 ENCOUNTER — Telehealth (HOSPITAL_COMMUNITY): Payer: Self-pay | Admitting: Oncology

## 2012-11-04 NOTE — Telephone Encounter (Signed)
Getting stronger with additional week break from Pomalyst.  He says, I feel 100% better."  He will continue to hold Pomalyst until he hears back from Dr. Mariel Sleet with recommendations. He says, "I do not want to go back on that pill if it is going to make me feel weak like it has been doing."  [name]

## 2012-11-07 ENCOUNTER — Telehealth (HOSPITAL_COMMUNITY): Payer: Self-pay | Admitting: Oncology

## 2012-11-07 NOTE — Telephone Encounter (Signed)
Spoke with [name].  We will adjust his Pomalyst regimen to 1 mg 7 days on and 14 days off due to severe fatigue while on the medication.  He will start that today (11/07/2012) with 1 mg x 7 days.    We will get him to see Dr. Greggory Stallion for an opinion regarding future therapy options and his recommendations.   [name]

## 2012-11-17 ENCOUNTER — Other Ambulatory Visit (HOSPITAL_COMMUNITY): Payer: Self-pay | Admitting: Oncology

## 2012-11-17 DIAGNOSIS — C9000 Multiple myeloma not having achieved remission: Secondary | ICD-10-CM

## 2012-11-17 MED ORDER — POMALIDOMIDE 1 MG PO CAPS: ORAL_CAPSULE | ORAL | Status: DC

## 2012-11-21 ENCOUNTER — Telehealth (HOSPITAL_COMMUNITY): Payer: Self-pay | Admitting: Oncology

## 2012-11-21 NOTE — Telephone Encounter (Signed)
Spoke with [name].  He saw the dentist the other day and reports a "knot" under his chin.  Dentist performed Xray and it was negative.  He sees Dr. Greggory Stallion on Wednesday.  Will await for his input on [name]'s MM treatment plan.  KEFALAS,THOMAS

## 2012-11-22 ENCOUNTER — Emergency Department (HOSPITAL_COMMUNITY): Payer: PRIVATE HEALTH INSURANCE

## 2012-11-22 ENCOUNTER — Emergency Department (HOSPITAL_COMMUNITY)
Admission: EM | Admit: 2012-11-22 | Discharge: 2012-11-22 | Disposition: A | Discharge status: Home / Self Care | Payer: PRIVATE HEALTH INSURANCE | Attending: Emergency Medicine | Admitting: Emergency Medicine

## 2012-11-22 ENCOUNTER — Encounter (HOSPITAL_COMMUNITY): Payer: Self-pay | Admitting: *Deleted

## 2012-11-22 DIAGNOSIS — Z87828 Personal history of other (healed) physical injury and trauma: Secondary | ICD-10-CM | POA: Insufficient documentation

## 2012-11-22 DIAGNOSIS — Z79899 Other long term (current) drug therapy: Secondary | ICD-10-CM | POA: Insufficient documentation

## 2012-11-22 DIAGNOSIS — L03221 Cellulitis of neck: Secondary | ICD-10-CM | POA: Insufficient documentation

## 2012-11-22 DIAGNOSIS — I10 Essential (primary) hypertension: Secondary | ICD-10-CM | POA: Insufficient documentation

## 2012-11-22 DIAGNOSIS — L0211 Cutaneous abscess of neck: Secondary | ICD-10-CM | POA: Insufficient documentation

## 2012-11-22 DIAGNOSIS — R6884 Jaw pain: Secondary | ICD-10-CM | POA: Insufficient documentation

## 2012-11-22 DIAGNOSIS — Z8739 Personal history of other diseases of the musculoskeletal system and connective tissue: Secondary | ICD-10-CM | POA: Insufficient documentation

## 2012-11-22 DIAGNOSIS — C9001 Multiple myeloma in remission: Secondary | ICD-10-CM | POA: Insufficient documentation

## 2012-11-22 LAB — BASIC METABOLIC PANEL
BUN: 50 mg/dL — ABNORMAL HIGH (ref 6–23)
Chloride: 104 mEq/L (ref 96–112)
Creatinine, Ser: 3.13 mg/dL — ABNORMAL HIGH (ref 0.50–1.35)
GFR calc Af Amer: 22 mL/min — ABNORMAL LOW (ref >90)
GFR calc non Af Amer: 19 mL/min — ABNORMAL LOW (ref >90)
Glucose, Bld: 116 mg/dL — ABNORMAL HIGH (ref 70–99)

## 2012-11-22 LAB — CBC WITH DIFFERENTIAL/PLATELET
Basophils Absolute: 0 10*3/uL (ref 0.0–0.1)
Eosinophils Absolute: 0.1 10*3/uL (ref 0.0–0.7)
Eosinophils Relative: 1 % (ref 0–5)
MCH: 33.9 pg (ref 26.0–34.0)
MCV: 98.8 fL (ref 78.0–100.0)
Monocytes Absolute: 0.7 10*3/uL (ref 0.1–1.0)
Platelets: 56 10*3/uL — ABNORMAL LOW (ref 150–400)
RDW: 14.1 % (ref 11.5–15.5)

## 2012-11-22 MED ORDER — CLINDAMYCIN HCL 150 MG PO CAPS: ORAL_CAPSULE | ORAL | Status: DC

## 2012-11-22 MED ORDER — SODIUM CHLORIDE 0.9 % IV SOLN: INTRAVENOUS | Status: DC

## 2012-11-22 NOTE — ED Notes (Signed)
Pt refused d/c vitals.

## 2012-11-22 NOTE — ED Notes (Signed)
Dr. McManus at bedside,  

## 2012-11-22 NOTE — ED Notes (Signed)
Has had "knot" on frontal area of neck over last few weeks, got larger overnight.  Now having pain in neck radiating to jaws bilaterally.  Went to dentist yesterday, but he saw nothing; referred to oral surgeon who cannot see him until the 28th.  Has history of "bone cancer" - recv'd. Bone marrow tplant last year at this time.

## 2012-11-22 NOTE — ED Provider Notes (Signed)
History    CSN: [id number] Arrival date & time 11/22/12  1054  First MD Initiated Contact with Patient 11/22/12 1143     Chief Complaint  Patient presents with  . Facial Swelling  . Jaw Pain    HPI Pt was seen at 1215.  Per pt, c/o gradual onset and persistence of constant anterior neck "swelling" over the past 3 weeks. Pt states the area "seems bigger today."  Pt states he was eval by his dentist yesterday for same and "told I was ok." Denies SOB, no intra-oral edema, no hoarse voice, no drooling, no stridor, no fevers, no sore throat, no rash.    Past Medical History  Diagnosis Date  . Hypertension   . Ruptured cervical disc   . Fall resulting in striking against other object     paralyzed  . Multiple myeloma   . 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 Multiple myeloma 01/26/2011   Past Surgical History  Procedure Laterality Date  . Inguinal hernia repair    . Anterior cervical decomp/discectomy fusion    . Bone marrow aspirate and biopsy wiith lumbar puncture    . Melanoma excision      rt. breast  . Cervical spine surgery     Family History  Problem Relation Age of Onset  . Cancer Sister   . Cancer Brother    History  Substance Use Topics  . Smoking status: Never Smoker   . Smokeless tobacco: Never Used  . Alcohol Use: No    Review of Systems ROS: Statement: All systems negative except as marked or noted in the HPI; Constitutional: Negative for fever and chills. ; ; Eyes: Negative for eye pain, redness and discharge. ; ; ENMT: Negative for ear pain, hoarseness, nasal congestion, sinus pressure and sore throat. ; ; Cardiovascular: Negative for chest pain, palpitations, diaphoresis, dyspnea and peripheral edema. ; ; Respiratory: Negative for cough, wheezing and stridor. ; ; Gastrointestinal: Negative for nausea, vomiting, diarrhea, abdominal pain, blood in stool, hematemesis, jaundice and rectal bleeding. . ; ; Genitourinary: Negative  for dysuria, flank pain and hematuria. ; ; Musculoskeletal: Negative for back pain and neck pain. Negative for swelling and trauma.; ; Skin: +swelling. Negative for pruritus, rash, abrasions, blisters, bruising and skin lesion.; ; Neuro: Negative for headache, lightheadedness and neck stiffness. Negative for weakness, altered level of consciousness , altered mental status, extremity weakness, paresthesias, involuntary movement, seizure and syncope.       Allergies  Pamidronate  Home Medications   Current Outpatient Rx  Name  Route  Sig  Dispense  Refill  . dexamethasone (DECADRON) 4 MG tablet      Take 2 with breakfast once a week!   24 tablet   3   . diltiazem (CARDIZEM CD) 180 MG 24 hr capsule   Oral   Take 180 mg by mouth daily.          . fish oil-omega-3 fatty acids 1000 MG capsule   Oral   Take 2 g by mouth daily.         Marland Kitchen gabapentin (NEURONTIN) 300 MG capsule   Oral   Take 2 capsules (600 mg total) by mouth daily.   60 capsule   1   . HYDROcodone-acetaminophen (NORCO/VICODIN) 5-325 MG per tablet   Oral   Take 30 tablets by mouth every 6 (six) hours as needed for pain.   30 tablet   0   . lisinopril (  PRINIVIL,ZESTRIL) 10 MG tablet   Oral   Take 10 mg by mouth daily.           . polyethylene glycol powder (GLYCOLAX/MIRALAX) powder   Oral   Take 17 g by mouth as needed.          . pomalidomide (POMALYST) 1 MG capsule      Take 1 tablet, PO daily for 7 days on and 14 days off, then repeat   14 capsule   0   . spironolactone (ALDACTONE) 50 MG tablet   Oral   Take 1 tablet (50 mg total) by mouth daily.   30 tablet   1   . sulfamethoxazole-trimethoprim (BACTRIM,SEPTRA) 400-80 MG per tablet   Oral   Take 1 tablet by mouth. Take  1 tablet on Monday Wednesday and Friday         . Tamsulosin HCl (FLOMAX) 0.4 MG CAPS   Oral   Take 0.4 mg by mouth daily.          Marland Kitchen triamterene-hydrochlorothiazide (DYAZIDE) 37.5-25 MG per capsule      Take one  Mondays, Wednesdays, and Fridays   15 capsule   1   . Sodium Polystyrene Sulfonate (KAYEXALATE PO)   Oral   Take by mouth once a week. On 1 week  Takes 1 dose and on next week takes 2 doses during the week.          BP 145/92  Pulse 62  Temp(Src) 98.1 F (36.7 C) (Oral)  Resp 18  Ht 6' (1.829 m)  Wt 240 lb (108.863 kg)  BMI 32.54 kg/m2  SpO2 100% Physical Exam 1220: Physical examination:  Nursing notes reviewed; Vital signs and O2 SAT reviewed;  Constitutional: Well developed, Well nourished, Well hydrated, In no acute distress; Head:  Normocephalic, atraumatic; Eyes: EOMI, PERRL, No scleral icterus; ENMT: Mouth and pharynx normal, Mucous membranes moist. TM's clear bilat. Mouth and pharynx without lesions. No tonsillar exudates. No intra-oral edema. No sublingual edema. No hoarse voice, no drooling, no stridor. No pain with manipulation of larynx.;; Neck: +localized area of edema underneath anterior mandible. Supple, Full range of motion, No lymphadenopathy; Cardiovascular: Regular rate and rhythm, No gallop; Respiratory: Breath sounds clear & equal bilaterally, No rales, rhonchi, wheezes.  Speaking full sentences with ease, Normal respiratory effort/excursion; Chest: Nontender, Movement normal; Abdomen: Soft, Nontender, Nondistended, Normal bowel sounds; Genitourinary: No CVA tenderness; Extremities: Pulses normal, No tenderness, No edema, No calf edema or asymmetry.; Neuro: AA&Ox3, Major CN grossly intact.  Speech clear. Gait steady. No gross focal motor or sensory deficits in extremities.; Skin: Color normal, Warm, Dry.   ED Course  Procedures   MDM  MDM Reviewed: previous chart, nursing note and vitals Reviewed previous: labs Interpretation: labs and CT scan   Results for orders placed during the hospital encounter of 11/22/12  CBC WITH DIFFERENTIAL      Result Value Range   WBC 6.8  4.0 - 10.5 K/uL   RBC 3.30 (*) 4.22 - 5.81 MIL/uL   Hemoglobin 11.2 (*) 13.0 - 17.0 g/dL    HCT 14.7 (*) 82.9 - 52.0 %   MCV 98.8  78.0 - 100.0 fL   MCH 33.9  26.0 - 34.0 pg   MCHC 34.4  30.0 - 36.0 g/dL   RDW 56.2  13.0 - 86.5 %   Platelets 56 (*) 150 - 400 K/uL   Neutrophils Relative % 57  43 - 77 %   Neutro Abs 3.9  1.7 -  7.7 K/uL   Lymphocytes Relative 31  12 - 46 %   Lymphs Abs 2.1  0.7 - 4.0 K/uL   Monocytes Relative 10  3 - 12 %   Monocytes Absolute 0.7  0.1 - 1.0 K/uL   Eosinophils Relative 1  0 - 5 %   Eosinophils Absolute 0.1  0.0 - 0.7 K/uL   Basophils Relative 0  0 - 1 %   Basophils Absolute 0.0  0.0 - 0.1 K/uL   WBC Morphology ATYPICAL LYMPHOCYTES     Smear Review PLATELET COUNT CONFIRMED BY SMEAR    BASIC METABOLIC PANEL      Result Value Range   Sodium 139  135 - 145 mEq/L   Potassium 4.1  3.5 - 5.1 mEq/L   Chloride 104  96 - 112 mEq/L   CO2 23  19 - 32 mEq/L   Glucose, Bld 116 (*) 70 - 99 mg/dL   BUN 50 (*) 6 - 23 mg/dL   Creatinine, Ser 4.09 (*) 0.50 - 1.35 mg/dL   Calcium 8.5  8.4 - 81.1 mg/dL   GFR calc non Af Amer 19 (*) >90 mL/min   GFR calc Af Amer 22 (*) >90 mL/min   Ct Soft Tissue Neck Wo Contrast 11/22/2012   *RADIOLOGY REPORT*  Clinical Data: Facial swelling.  Jaw pain.  CT NECK WITHOUT CONTRAST  Technique:  Multidetector CT imaging of the neck was performed without intravenous contrast.  Comparison: None.  Findings: Soft tissue swelling is present just below the anterior mandible of the midline.  There is focal thickening of the platysma without a discrete collection.  No discrete mass lesion is evident. The bone windows demonstrate no osteolysis or focal dental or periodontal disease anteriorly.  There is a prominent dental caries within the first left mandibular molar.  The residual teeth are otherwise unremarkable.  No significant submental lymph nodes are present.  Reactive type submandibular lymph nodes are seen bilaterally.  Small level II lymph nodes are present bilaterally as well.  No other significant neck mass is evident.  There is  focal inflammation along the left side of the neck at the level of the thyroid cartilage which may be related prior surgery.  No discrete abscess is evident.  No significant mucosal or submucosal lesions are present.  The vocal cords are midline and symmetric.  Bone windows demonstrate extensive cervical fusion including ACDF at C3-4 and posterior fusion at C4-5, C5-6, C6-7, and C7-T1.  IMPRESSION:  1.  Focal inflammatory changes in the submental region with thickening of the platysma but most discrete abscess. 2.  Reactive sized lymph nodes and submandibular level II stations bilaterally. 3.  Asymmetric lower left sided inflammatory changes at the level of the thyroid cartilage.  Question prior surgery. 4.  Extensive postsurgical changes of the cervical spine.   Original Report Authenticated By: Marin Roberts, M.D.    Results for ZEBASTIAN, CARICO (MRN [id number]) as of 11/22/2012 14:49  Ref. Range 08/19/2012 10:00 10/04/2012 09:14 11/22/2012 12:35  BUN Latest Range: 6-23 mg/dL 65 (H) 64 (H) 50 (H)  Creatinine Latest Range: 0.50-1.35 mg/dL 2.13 (H) 0.86 (H) 5.78 (H)    1445:  Known renal insuff. WBC count normal. Pt states he "has to leave now."  Does not want to stay for IV abx dose or any other tx/dx testing. No change in assessment, though pt states the area "has gotten much smaller since I've been here."  Dx and testing d/w pt.  Questions answered.  Verb understanding, agreeable to d/c home with outpt f/u.   Laray Anger, DO 11/24/12 1239

## 2012-11-22 NOTE — ED Notes (Signed)
Pt presents to er this am for further evaluation of "knot" under chin and front of neck area, pt states that he first started having problems 3 weeks ago, area became worse since yesterday,  Is red this am, was seen by dentist yesterday with no findings. Is scheduled to follow up with oral surgeon, pt denies any fever, generalized illness, reports that the area does not bother him with eating or drinking.

## 2012-11-25 ENCOUNTER — Other Ambulatory Visit (HOSPITAL_COMMUNITY): Payer: Self-pay | Admitting: Oncology

## 2012-12-07 ENCOUNTER — Other Ambulatory Visit (HOSPITAL_COMMUNITY): Payer: PRIVATE HEALTH INSURANCE

## 2012-12-07 ENCOUNTER — Other Ambulatory Visit: Payer: Self-pay | Admitting: Oncology

## 2012-12-07 DIAGNOSIS — C9000 Multiple myeloma not having achieved remission: Secondary | ICD-10-CM

## 2012-12-08 ENCOUNTER — Encounter (HOSPITAL_COMMUNITY): Payer: Self-pay | Admitting: Oncology

## 2012-12-08 ENCOUNTER — Encounter (HOSPITAL_COMMUNITY): Payer: PRIVATE HEALTH INSURANCE | Attending: Oncology

## 2012-12-08 DIAGNOSIS — C9000 Multiple myeloma not having achieved remission: Secondary | ICD-10-CM

## 2012-12-08 LAB — COMPREHENSIVE METABOLIC PANEL
Albumin: 3.6 g/dL (ref 3.5–5.2)
BUN: 54 mg/dL — ABNORMAL HIGH (ref 6–23)
Calcium: 9.7 mg/dL (ref 8.4–10.5)
Creatinine, Ser: 3.73 mg/dL — ABNORMAL HIGH (ref 0.50–1.35)
GFR calc Af Amer: 18 mL/min — ABNORMAL LOW (ref >90)
Glucose, Bld: 117 mg/dL — ABNORMAL HIGH (ref 70–99)
Potassium: 5.5 mEq/L — ABNORMAL HIGH (ref 3.5–5.1)
Total Protein: 7.2 g/dL (ref 6.0–8.3)

## 2012-12-08 LAB — CBC WITH DIFFERENTIAL/PLATELET
Basophils Absolute: 0 10*3/uL (ref 0.0–0.1)
Basophils Relative: 0 % (ref 0–1)
Eosinophils Absolute: 0.1 10*3/uL (ref 0.0–0.7)
HCT: 31.2 % — ABNORMAL LOW (ref 39.0–52.0)
MCH: 33.9 pg (ref 26.0–34.0)
MCHC: 34 g/dL (ref 30.0–36.0)
Monocytes Absolute: 0.3 10*3/uL (ref 0.1–1.0)
Monocytes Relative: 7 % (ref 3–12)
Neutro Abs: 1.8 10*3/uL (ref 1.7–7.7)
RDW: 14.3 % (ref 11.5–15.5)

## 2012-12-08 MED ORDER — POMALIDOMIDE 1 MG PO CAPS: ORAL_CAPSULE | ORAL | Status: DC

## 2012-12-08 NOTE — Progress Notes (Unsigned)
Labs drawn today for cbc/diff,cmp,kllc,mm panel

## 2012-12-09 ENCOUNTER — Telehealth: Payer: Self-pay | Admitting: Oncology

## 2012-12-09 ENCOUNTER — Encounter (HOSPITAL_BASED_OUTPATIENT_CLINIC_OR_DEPARTMENT_OTHER): Payer: PRIVATE HEALTH INSURANCE

## 2012-12-09 VITALS — BP 129/76 | HR 58 | Resp 18

## 2012-12-09 DIAGNOSIS — N289 Disorder of kidney and ureter, unspecified: Secondary | ICD-10-CM

## 2012-12-09 DIAGNOSIS — D638 Anemia in other chronic diseases classified elsewhere: Secondary | ICD-10-CM

## 2012-12-09 DIAGNOSIS — C9000 Multiple myeloma not having achieved remission: Secondary | ICD-10-CM

## 2012-12-09 LAB — KAPPA/LAMBDA LIGHT CHAINS
Kappa free light chain: 3.76 mg/dL — ABNORMAL HIGH (ref 0.33–1.94)
Kappa, lambda light chain ratio: 1.88 — ABNORMAL HIGH (ref 0.26–1.65)
Lambda free light chains: 2 mg/dL (ref 0.57–2.63)

## 2012-12-09 MED ORDER — DARBEPOETIN ALFA-POLYSORBATE 500 MCG/ML IJ SOLN
500.0000 ug | Freq: Once | INTRAMUSCULAR | Status: AC
  Administered 2012-12-09: 500 ug via SUBCUTANEOUS

## 2012-12-09 MED ORDER — DARBEPOETIN ALFA-POLYSORBATE 500 MCG/ML IJ SOLN
INTRAMUSCULAR | Status: AC
  Filled 2012-12-09: qty 1

## 2012-12-09 NOTE — Telephone Encounter (Signed)
Pt cancelled appt , York Cerise aware

## 2012-12-09 NOTE — Telephone Encounter (Signed)
lvm for pt regarding to 8.5.14...amiled pt appt sched avs and letter

## 2012-12-09 NOTE — Progress Notes (Signed)
Arpan C Villasenor presents today for injection per MD orders. Aranesp 500 mcg administered SQ in left Abdomen. Administration without incident. Patient tolerated well.  

## 2012-12-12 ENCOUNTER — Telehealth: Payer: Self-pay | Admitting: Oncology

## 2012-12-12 LAB — MULTIPLE MYELOMA PANEL, SERUM
Albumin ELP: 63.8 % (ref 55.8–66.1)
Beta Globulin: 6.1 % (ref 4.7–7.2)
IgA: 68 mg/dL (ref 68–379)
IgM, Serum: 76 mg/dL (ref 41–251)
M-Spike, %: NOT DETECTED g/dL
Total Protein: 6.5 g/dL (ref 6.0–8.3)

## 2012-12-12 NOTE — Telephone Encounter (Signed)
S/W PT IN RE [facility]. PT STATED THAT HE WOULD CONTINUE TO SEE DR @ [facility]

## 2012-12-20 ENCOUNTER — Other Ambulatory Visit: Payer: PRIVATE HEALTH INSURANCE | Admitting: Lab

## 2012-12-20 ENCOUNTER — Ambulatory Visit: Payer: PRIVATE HEALTH INSURANCE | Admitting: Oncology

## 2012-12-20 ENCOUNTER — Ambulatory Visit: Payer: PRIVATE HEALTH INSURANCE

## 2012-12-21 ENCOUNTER — Encounter (HOSPITAL_COMMUNITY): Payer: PRIVATE HEALTH INSURANCE | Attending: Oncology | Admitting: Oncology

## 2012-12-21 ENCOUNTER — Encounter (HOSPITAL_COMMUNITY): Payer: Self-pay | Admitting: Oncology

## 2012-12-21 VITALS — BP 119/80 | HR 51 | Temp 97.3°F | Resp 16 | Wt 249.5 lb

## 2012-12-21 DIAGNOSIS — R5381 Other malaise: Secondary | ICD-10-CM | POA: Insufficient documentation

## 2012-12-21 DIAGNOSIS — R21 Rash and other nonspecific skin eruption: Secondary | ICD-10-CM

## 2012-12-21 DIAGNOSIS — T50995A Adverse effect of other drugs, medicaments and biological substances, initial encounter: Secondary | ICD-10-CM | POA: Insufficient documentation

## 2012-12-21 DIAGNOSIS — L089 Local infection of the skin and subcutaneous tissue, unspecified: Secondary | ICD-10-CM | POA: Insufficient documentation

## 2012-12-21 DIAGNOSIS — C9002 Multiple myeloma in relapse: Secondary | ICD-10-CM

## 2012-12-21 DIAGNOSIS — T782XXA Anaphylactic shock, unspecified, initial encounter: Secondary | ICD-10-CM | POA: Insufficient documentation

## 2012-12-21 DIAGNOSIS — Z888 Allergy status to other drugs, medicaments and biological substances status: Secondary | ICD-10-CM | POA: Insufficient documentation

## 2012-12-21 DIAGNOSIS — N289 Disorder of kidney and ureter, unspecified: Secondary | ICD-10-CM | POA: Insufficient documentation

## 2012-12-21 DIAGNOSIS — I10 Essential (primary) hypertension: Secondary | ICD-10-CM | POA: Insufficient documentation

## 2012-12-21 DIAGNOSIS — C9000 Multiple myeloma not having achieved remission: Secondary | ICD-10-CM | POA: Insufficient documentation

## 2012-12-21 DIAGNOSIS — W57XXXA Bitten or stung by nonvenomous insect and other nonvenomous arthropods, initial encounter: Secondary | ICD-10-CM

## 2012-12-21 DIAGNOSIS — I498 Other specified cardiac arrhythmias: Secondary | ICD-10-CM | POA: Insufficient documentation

## 2012-12-21 MED ORDER — DOXYCYCLINE HYCLATE 100 MG PO TABS: 100.0000 mg | ORAL_TABLET | Freq: Two times a day (BID) | ORAL | Status: DC

## 2012-12-21 NOTE — Progress Notes (Signed)
[name], MD [address] [place] [address]  Multiple myeloma - Plan: CBC with Differential, Comprehensive metabolic panel, Lactate dehydrogenase, C-reactive protein, Multiple myeloma panel, serum, Kappa/lambda light chains  Bug bites - Plan: doxycycline (VIBRA-TABS) 100 MG tablet  CURRENT THERAPY: Pomalyst every other day x 14 days followed by 7 day respite, then repeated.  Dexamethasone D/C'd due to patient reported intolerance.  Anaphylactic reaction to bisphosphonate therapy, D/C'd  INTERVAL HISTORY: [name] 67 y.o. 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 Greggory Stallion at [facility].   He is following up with Dr. Kristian Covey for his renal function. His PCP with the assistance of nephrology are managing the patient's BP medications.   He was recently seen by Dr. Greggory Stallion at [facility] who helped develop the patient's maintenance program with Pomalyst.  He is tolerating this regimen very well.  He feels great and his fatigue is much improved since this change.   He reports that on Saturday, he was at a friends house when he noticed a pruritic spot on his back.  He used the back of the chair he was sitting in to scratch the area.  On Sunday, he noted that he had 4 spots on his upper back that were red and pruritic.  On exam, he has 4-5 areas that are erythematous measuring up to 3 cm with a central area that is scabbed over secondary to scratching.  Other than pruritis, he denies any complaints.  They appear like insect bites without any clinical evidence for zoster or other worrisome rash.  I will treat to cover MRSA infection with Doxycycline x 7 days.   I personally reviewed and went over laboratory results with the patient.  His MM panel remains stable without an M-Spike and no increasing light chain disease.  Hematologically, he  denies any complaints and ROS questioning is negative.   Past Medical History  Diagnosis Date  . Hypertension   . Ruptured cervical disc   . Fall resulting in striking against other object     paralyzed  . Multiple myeloma   . 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 Multiple myeloma 01/26/2011    has ESSENTIAL HYPERTENSION, BENIGN; BRADYCARDIA; Multiple myeloma; and Intravenous pamidronate causing adverse effect in therapeutic use on his problem list.     is allergic to pamidronate.  [name] had no medications administered during this visit.  Past Surgical History  Procedure Laterality Date  . Inguinal hernia repair    . Anterior cervical decomp/discectomy fusion    . Bone marrow aspirate and biopsy wiith lumbar puncture    . Melanoma excision      rt. breast  . Cervical spine surgery      Denies any headaches, dizziness, double vision, fevers, chills, night sweats, nausea, vomiting, diarrhea, constipation, chest pain, heart palpitations, shortness of breath, blood in stool, black tarry stool, urinary pain, urinary burning, urinary frequency, hematuria.   PHYSICAL EXAMINATION  ECOG PERFORMANCE STATUS: 0 - Asymptomatic  Filed Vitals:   12/21/12 1207  BP: 119/80  Pulse: 51  Temp: 97.3 F (36.3 C)  Resp: 16    GENERAL:alert, no distress, well nourished, well developed, comfortable, cooperative, obese and smiling SKIN: skin color, texture, turgor are normal, erythematous areas on upper back, favoring right side measuring up to 3  cm in size with central scab HEAD: Normocephalic, No masses, lesions, tenderness or abnormalities EYES: normal, PERRLA, EOMI, Conjunctiva are pink and non-injected EARS: External ears normal OROPHARYNX:mucous membranes are moist  NECK: supple, no adenopathy, thyroid normal size, non-tender, without nodularity, no stridor, non-tender, trachea midline LYMPH:  no palpable lymphadenopathy BREAST:not examined LUNGS:  clear to auscultation and percussion HEART: regular rate & rhythm, no murmurs, no gallops, S1 normal and S2 normal ABDOMEN:abdomen soft, non-tender and normal bowel sounds BACK: Back symmetric, no curvature.  See skin exam for erythematous areas on back. EXTREMITIES:less then 2 second capillary refill, no joint deformities, effusion, or inflammation, no skin discoloration, no clubbing, no cyanosis  NEURO: alert & oriented x 3 with fluent speech, no focal motor/sensory deficits, gait normal    LABORATORY DATA: CBC    Component Value Date/Time   WBC 4.4 12/08/2012 0921   RBC 3.13* 12/08/2012 0921   HGB 10.6* 12/08/2012 0921   HCT 31.2* 12/08/2012 0921   PLT 57* 12/08/2012 0921   MCV 99.7 12/08/2012 0921   MCH 33.9 12/08/2012 0921   MCHC 34.0 12/08/2012 0921   RDW 14.3 12/08/2012 0921   LYMPHSABS 2.2 12/08/2012 0921   MONOABS 0.3 12/08/2012 0921   EOSABS 0.1 12/08/2012 0921   BASOSABS 0.0 12/08/2012 0921      Chemistry      Component Value Date/Time   NA 140 12/08/2012 0921   K 5.5* 12/08/2012 0921   CL 105 12/08/2012 0921   CO2 24 12/08/2012 0921   BUN 54* 12/08/2012 0921   CREATININE 3.73* 12/08/2012 0921   CREATININE 43.09* 01/26/2012 1052      Component Value Date/Time   CALCIUM 9.7 12/08/2012 0921   ALKPHOS 57 12/08/2012 0921   AST 25 12/08/2012 0921   ALT 23 12/08/2012 0921   BILITOT 0.3 12/08/2012 0921     Results for [name], [name] (MRN [id number]) as of 12/21/2012 12:43  Ref. Range 12/08/2012 09:21  Albumin ELP Latest Range: 55.8-66.1 % 63.8  Alpha-1-Globulin Latest Range: 2.9-4.9 % 4.1  Alpha-2-Globulin Latest Range: 7.1-11.8 % 10.6  Beta Globulin Latest Range: 4.7-7.2 % 6.1  Beta 2 Latest Range: 3.2-6.5 % 3.5  Gamma Globulin Latest Range: 11.1-18.8 % 11.9  M-SPIKE, % No range found NOT DETECTED  SPE Interp. No range found (NOTE)  Comment No range found (NOTE)  IgG (Immunoglobin G), Serum Latest Range: [phone] mg/dL 829  IgA Latest Range: 68-379 mg/dL 68  IgM, Serum Latest  Range: 41-251 mg/dL 76  Kappa free light chain Latest Range: 0.33-1.94 mg/dL 5.62 (H)  Lamda free light chains Latest Range: 0.57-2.63 mg/dL 1.30  Kappa, lamda light chain ratio Latest Range: 0.26-1.65  1.88 (H)      ASSESSMENT:  1. 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 Greggory Stallion at [facility]. Pomalyst restarted on September 05, 2012, 7 days on and 7 days off. Dexamethasone D/C'd due to patient reported intolerance. 2. Anaphylactic reaction to bisphosphonate therapy, D/C'd 3. Renal Disease, followed by nephrology 4. Skin infection, secondary to insect bites, will cover for MRSA  Patient Active Problem List   Diagnosis Date Noted  . Intravenous pamidronate causing adverse effect in therapeutic use 10/14/2012  . Multiple myeloma 01/26/2011  . ESSENTIAL HYPERTENSION, BENIGN 03/05/2010  . BRADYCARDIA 03/05/2010     PLAN:  1. I personally reviewed and went over laboratory results with the patient. 2. Labs every 3 weeks: CBC 3. Continue with Aranesp per treatment  parameters 4. Labs every 6 weeks: CBC diff, CMET, CRP, MM panel 5. Doxycycline 100 mg BID x 7 days for skin infection 6. Continue Pomalyst as described above 7. Continue follow-up with Nephrology and PCP as directed 8. Return in 3 months for follow-up.    THERAPY PLAN:  We will continue with Aranesp per treatment parameters with labs.  Will perform MM labs every 6 weeks due to stability of disease.  He is to continue with Pomalyst 1 mg every other day x 14 days with 7 day respite, then repeated.  Bisphosphonate therapy D/C'd due to anaphylactic reaction.  Dexamethasone D/C'd at patient's request due to patient reported intolerance.  At some time, I think it may be reasonable to re-introduce Dexamethasone now that he is feeling better on decreased dose and regimen of Pomalyst  All questions were answered. The patient knows to call the clinic with any  problems, questions or concerns. We can certainly see the patient much sooner if necessary.  Patient and plan discussed with Dr. Gerarda Fraction and he is in agreement with the aforementioned.  KEFALAS,THOMAS

## 2012-12-21 NOTE — Patient Instructions (Addendum)
.  [facility] Discharge Instructions  RECOMMENDATIONS MADE BY THE CONSULTANT AND ANY TEST RESULTS WILL BE SENT TO YOUR REFERRING PHYSICIAN.  EXAM FINDINGS BY THE PHYSICIAN TODAY AND SIGNS OR SYMPTOMS TO REPORT TO CLINIC OR PRIMARY PHYSICIAN: Exam per Elijah Birk today  MEDICATIONS PRESCRIBED:  Doxycyline for bites on your back  INSTRUCTIONS GIVEN AND DISCUSSED: Labs every 6 weeks  SPECIAL INSTRUCTIONS/FOLLOW-UP: 3 months  Thank you for choosing [facility] to provide your oncology and hematology care.  To afford each patient quality time with our providers, please arrive at least 15 minutes before your scheduled appointment time.  With your help, our goal is to use those 15 minutes to complete the necessary work-up to ensure our physicians have the information they need to help with your evaluation and healthcare recommendations.    Effective January 1st, 2014, we ask that you re-schedule your appointment with our physicians should you arrive 10 or more minutes late for your appointment.  We strive to give you quality time with our providers, and arriving late affects you and other patients whose appointments are after yours.    Again, thank you for choosing [facility].  Our hope is that these requests will decrease the amount of time that you wait before being seen by our physicians.       _____________________________________________________________  Should you have questions after your visit to [facility] [place], please contact our office at [phone] between the hours of 8:30 a.m. and 5:00 p.m.  Voicemails left after 4:30 p.m. will not be returned until the following business day.  For prescription refill requests, have your pharmacy contact our office with your prescription refill request.

## 2012-12-30 ENCOUNTER — Encounter (HOSPITAL_BASED_OUTPATIENT_CLINIC_OR_DEPARTMENT_OTHER): Payer: PRIVATE HEALTH INSURANCE

## 2012-12-30 ENCOUNTER — Encounter (HOSPITAL_COMMUNITY): Payer: PRIVATE HEALTH INSURANCE

## 2012-12-30 DIAGNOSIS — C9000 Multiple myeloma not having achieved remission: Secondary | ICD-10-CM

## 2012-12-30 DIAGNOSIS — N189 Chronic kidney disease, unspecified: Secondary | ICD-10-CM

## 2012-12-30 DIAGNOSIS — N289 Disorder of kidney and ureter, unspecified: Secondary | ICD-10-CM

## 2012-12-30 DIAGNOSIS — D638 Anemia in other chronic diseases classified elsewhere: Secondary | ICD-10-CM

## 2012-12-30 DIAGNOSIS — D631 Anemia in chronic kidney disease: Secondary | ICD-10-CM

## 2012-12-30 LAB — CBC WITH DIFFERENTIAL/PLATELET
Basophils Relative: 0 % (ref 0–1)
Eosinophils Absolute: 0.1 10*3/uL (ref 0.0–0.7)
Eosinophils Relative: 2 % (ref 0–5)
Lymphs Abs: 2.3 10*3/uL (ref 0.7–4.0)
MCH: 33.2 pg (ref 26.0–34.0)
MCHC: 32.9 g/dL (ref 30.0–36.0)
MCV: 100.9 fL — ABNORMAL HIGH (ref 78.0–100.0)
Monocytes Relative: 10 % (ref 3–12)
Neutrophils Relative %: 31 % — ABNORMAL LOW (ref 43–77)
Platelets: 42 10*3/uL — ABNORMAL LOW (ref 150–400)
RBC: 3.37 MIL/uL — ABNORMAL LOW (ref 4.22–5.81)
Smear Review: DECREASED

## 2012-12-30 MED ORDER — DARBEPOETIN ALFA-POLYSORBATE 500 MCG/ML IJ SOLN
INTRAMUSCULAR | Status: AC
  Filled 2012-12-30: qty 1

## 2012-12-30 MED ORDER — DARBEPOETIN ALFA-POLYSORBATE 500 MCG/ML IJ SOLN
500.0000 ug | Freq: Once | INTRAMUSCULAR | Status: AC
  Administered 2012-12-30: 500 ug via SUBCUTANEOUS

## 2012-12-30 NOTE — Progress Notes (Signed)
[name] presents today for injection per MD orders. Aranesp 500 mcg administered SQ in right Abdomen. Administration without incident. Patient tolerated well.  

## 2012-12-30 NOTE — Progress Notes (Signed)
[name] presented for labwork. Labs per MD order drawn via Peripheral Line 23 gauge needle inserted in right antecubital.  Good blood return present. Procedure without incident.  Needle removed intact. Patient tolerated procedure well.

## 2013-01-05 ENCOUNTER — Encounter: Payer: Self-pay | Admitting: Oncology

## 2013-01-09 ENCOUNTER — Other Ambulatory Visit (HOSPITAL_COMMUNITY): Payer: Self-pay | Admitting: Oncology

## 2013-01-09 DIAGNOSIS — C9000 Multiple myeloma not having achieved remission: Secondary | ICD-10-CM

## 2013-01-09 MED ORDER — POMALIDOMIDE 1 MG PO CAPS: ORAL_CAPSULE | ORAL | Status: DC

## 2013-01-20 ENCOUNTER — Encounter (HOSPITAL_BASED_OUTPATIENT_CLINIC_OR_DEPARTMENT_OTHER): Payer: PRIVATE HEALTH INSURANCE

## 2013-01-20 ENCOUNTER — Encounter (HOSPITAL_COMMUNITY): Payer: PRIVATE HEALTH INSURANCE | Attending: Oncology

## 2013-01-20 VITALS — BP 121/76 | HR 66 | Resp 16

## 2013-01-20 DIAGNOSIS — C9000 Multiple myeloma not having achieved remission: Secondary | ICD-10-CM

## 2013-01-20 DIAGNOSIS — D631 Anemia in chronic kidney disease: Secondary | ICD-10-CM

## 2013-01-20 DIAGNOSIS — T50995A Adverse effect of other drugs, medicaments and biological substances, initial encounter: Secondary | ICD-10-CM | POA: Insufficient documentation

## 2013-01-20 DIAGNOSIS — N189 Chronic kidney disease, unspecified: Secondary | ICD-10-CM

## 2013-01-20 DIAGNOSIS — D649 Anemia, unspecified: Secondary | ICD-10-CM

## 2013-01-20 LAB — CBC WITH DIFFERENTIAL/PLATELET
Basophils Relative: 0 % (ref 0–1)
Eosinophils Absolute: 0.1 10*3/uL (ref 0.0–0.7)
Hemoglobin: 11.4 g/dL — ABNORMAL LOW (ref 13.0–17.0)
MCHC: 33.4 g/dL (ref 30.0–36.0)
Monocytes Relative: 7 % (ref 3–12)
Neutro Abs: 1.2 10*3/uL — ABNORMAL LOW (ref 1.7–7.7)
Neutrophils Relative %: 32 % — ABNORMAL LOW (ref 43–77)
Platelets: 44 10*3/uL — ABNORMAL LOW (ref 150–400)
RBC: 3.37 MIL/uL — ABNORMAL LOW (ref 4.22–5.81)
Smear Review: DECREASED

## 2013-01-20 LAB — COMPREHENSIVE METABOLIC PANEL
ALT: 17 U/L (ref 0–53)
AST: 19 U/L (ref 0–37)
Albumin: 3.8 g/dL (ref 3.5–5.2)
Alkaline Phosphatase: 46 U/L (ref 39–117)
Calcium: 9.4 mg/dL (ref 8.4–10.5)
GFR calc Af Amer: 18 mL/min — ABNORMAL LOW (ref >90)
Glucose, Bld: 112 mg/dL — ABNORMAL HIGH (ref 70–99)
Potassium: 5.2 mEq/L — ABNORMAL HIGH (ref 3.5–5.1)
Sodium: 140 mEq/L (ref 135–145)
Total Protein: 7.3 g/dL (ref 6.0–8.3)

## 2013-01-20 LAB — LACTATE DEHYDROGENASE: LDH: 211 U/L (ref 94–250)

## 2013-01-20 LAB — C-REACTIVE PROTEIN: CRP: <0.5 mg/dL — ABNORMAL LOW (ref <0.60)

## 2013-01-20 MED ORDER — DARBEPOETIN ALFA-POLYSORBATE 500 MCG/ML IJ SOLN
INTRAMUSCULAR | Status: AC
  Filled 2013-01-20: qty 1

## 2013-01-20 MED ORDER — DARBEPOETIN ALFA-POLYSORBATE 500 MCG/ML IJ SOLN
500.0000 ug | Freq: Once | INTRAMUSCULAR | Status: AC
  Administered 2013-01-20: 500 ug via SUBCUTANEOUS

## 2013-01-20 NOTE — Progress Notes (Signed)
Labs drawn today for cbc/diff,crp,cmp,mm panel,kllc,ldh

## 2013-01-20 NOTE — Progress Notes (Signed)
[name] presents today for injection per MD orders. Aranesp 500 mcg administered SQ in left Abdomen. Administration without incident. Patient tolerated well.  

## 2013-01-23 LAB — KAPPA/LAMBDA LIGHT CHAINS: Kappa, lambda light chain ratio: 2.2 — ABNORMAL HIGH (ref 0.26–1.65)

## 2013-01-24 ENCOUNTER — Other Ambulatory Visit (HOSPITAL_COMMUNITY): Payer: Self-pay | Admitting: Oncology

## 2013-01-24 DIAGNOSIS — C9000 Multiple myeloma not having achieved remission: Secondary | ICD-10-CM

## 2013-01-24 LAB — MULTIPLE MYELOMA PANEL, SERUM
Albumin ELP: 66.1 % (ref 55.8–66.1)
Alpha-1-Globulin: 4.3 % (ref 2.9–4.9)
Alpha-2-Globulin: 8.6 % (ref 7.1–11.8)
Gamma Globulin: 12 % (ref 11.1–18.8)
IgA: 61 mg/dL — ABNORMAL LOW (ref 68–379)
IgG (Immunoglobin G), Serum: 880 mg/dL (ref 650–1600)
IgM, Serum: 65 mg/dL (ref 41–251)
Total Protein: 6.8 g/dL (ref 6.0–8.3)

## 2013-01-24 MED ORDER — POMALIDOMIDE 1 MG PO CAPS: ORAL_CAPSULE | ORAL | Status: DC

## 2013-02-03 ENCOUNTER — Other Ambulatory Visit (HOSPITAL_COMMUNITY): Payer: Self-pay | Admitting: Oncology

## 2013-02-03 DIAGNOSIS — C9000 Multiple myeloma not having achieved remission: Secondary | ICD-10-CM

## 2013-02-08 ENCOUNTER — Encounter: Payer: Self-pay | Admitting: Oncology

## 2013-02-10 ENCOUNTER — Encounter (HOSPITAL_COMMUNITY): Payer: PRIVATE HEALTH INSURANCE

## 2013-02-10 DIAGNOSIS — C9000 Multiple myeloma not having achieved remission: Secondary | ICD-10-CM

## 2013-02-10 LAB — CBC
HCT: 35 % — ABNORMAL LOW (ref 39.0–52.0)
Hemoglobin: 11.5 g/dL — ABNORMAL LOW (ref 13.0–17.0)
MCH: 33.4 pg (ref 26.0–34.0)
MCHC: 32.9 g/dL (ref 30.0–36.0)

## 2013-02-10 NOTE — Progress Notes (Unsigned)
[name]'s reason for visit today are for labs as scheduled per MD orders.  Venipuncture performed with a 23 gauge butterfly needle to R Antecubital.  [name] tolerated venipuncture well and without incident; questions were answered and patient was discharged.  Aranesp not indicated.  Lab Results  Component Value Date   HGB 11.5* 02/10/2013

## 2013-02-16 ENCOUNTER — Other Ambulatory Visit (HOSPITAL_COMMUNITY): Payer: Self-pay | Admitting: Oncology

## 2013-02-16 DIAGNOSIS — C9000 Multiple myeloma not having achieved remission: Secondary | ICD-10-CM

## 2013-02-16 MED ORDER — POMALIDOMIDE 1 MG PO CAPS: ORAL_CAPSULE | ORAL | Status: DC

## 2013-02-24 ENCOUNTER — Telehealth (HOSPITAL_COMMUNITY): Payer: Self-pay | Admitting: Oncology

## 2013-02-24 DIAGNOSIS — I498 Other specified cardiac arrhythmias: Secondary | ICD-10-CM

## 2013-02-24 DIAGNOSIS — C9000 Multiple myeloma not having achieved remission: Secondary | ICD-10-CM

## 2013-02-24 NOTE — Telephone Encounter (Signed)
Patient reports that he is experiencing dizziness when he changes positions from sitting/laying to standing.   I provided him education regarding orthostatic hypotension and recommended increased fluid intake.   He is going to follow-up with nephrologist in 2 weeks or so and I encouraged him to follow-up as scheduled.   He asked if I can add a lipid panel to his labs next week.  He was educated to fast prior to labs.   I will fax results to PCP.  [name]

## 2013-03-01 ENCOUNTER — Other Ambulatory Visit (HOSPITAL_COMMUNITY): Payer: Self-pay | Admitting: Oncology

## 2013-03-01 DIAGNOSIS — C9000 Multiple myeloma not having achieved remission: Secondary | ICD-10-CM

## 2013-03-01 MED ORDER — GABAPENTIN 300 MG PO CAPS: 600.0000 mg | ORAL_CAPSULE | Freq: Every day | ORAL | Status: DC

## 2013-03-03 ENCOUNTER — Encounter (HOSPITAL_COMMUNITY): Payer: PRIVATE HEALTH INSURANCE | Attending: Oncology

## 2013-03-03 ENCOUNTER — Ambulatory Visit (HOSPITAL_COMMUNITY): Payer: PRIVATE HEALTH INSURANCE

## 2013-03-03 ENCOUNTER — Other Ambulatory Visit (HOSPITAL_COMMUNITY): Payer: Self-pay | Admitting: Oncology

## 2013-03-03 DIAGNOSIS — T50995A Adverse effect of other drugs, medicaments and biological substances, initial encounter: Secondary | ICD-10-CM | POA: Insufficient documentation

## 2013-03-03 DIAGNOSIS — C9000 Multiple myeloma not having achieved remission: Secondary | ICD-10-CM | POA: Insufficient documentation

## 2013-03-03 LAB — BASIC METABOLIC PANEL
BUN: 40 mg/dL — ABNORMAL HIGH (ref 6–23)
Chloride: 107 mEq/L (ref 96–112)
Creatinine, Ser: 3.06 mg/dL — ABNORMAL HIGH (ref 0.50–1.35)
GFR calc Af Amer: 23 mL/min — ABNORMAL LOW (ref >90)
GFR calc non Af Amer: 20 mL/min — ABNORMAL LOW (ref >90)
Potassium: 4.7 mEq/L (ref 3.5–5.1)
Sodium: 140 mEq/L (ref 135–145)

## 2013-03-03 LAB — LIPID PANEL
HDL: 28 mg/dL — ABNORMAL LOW (ref >39)
LDL Cholesterol: 90 mg/dL (ref 0–99)
Triglycerides: 238 mg/dL — ABNORMAL HIGH (ref <150)

## 2013-03-03 LAB — CBC
HCT: 33.4 % — ABNORMAL LOW (ref 39.0–52.0)
Hemoglobin: 11.4 g/dL — ABNORMAL LOW (ref 13.0–17.0)
RBC: 3.33 MIL/uL — ABNORMAL LOW (ref 4.22–5.81)
RDW: 14.7 % (ref 11.5–15.5)
WBC: 4.5 10*3/uL (ref 4.0–10.5)

## 2013-03-03 NOTE — Addendum Note (Signed)
Addended by: Evelena Leyden on: 03/03/2013 02:44 PM   Modules accepted: Orders

## 2013-03-03 NOTE — Progress Notes (Signed)
Labs drawn today for lipid,vd25,cbc,bmp

## 2013-03-04 LAB — VITAMIN D 25 HYDROXY (VIT D DEFICIENCY, FRACTURES): Vit D, 25-Hydroxy: 18 ng/mL — ABNORMAL LOW (ref 30–89)

## 2013-03-06 ENCOUNTER — Other Ambulatory Visit (HOSPITAL_COMMUNITY): Payer: Self-pay | Admitting: Oncology

## 2013-03-06 ENCOUNTER — Encounter (HOSPITAL_COMMUNITY): Payer: Self-pay | Admitting: Oncology

## 2013-03-06 DIAGNOSIS — E559 Vitamin D deficiency, unspecified: Secondary | ICD-10-CM

## 2013-03-06 HISTORY — DX: Vitamin D deficiency, unspecified: E55.9

## 2013-03-14 ENCOUNTER — Other Ambulatory Visit (HOSPITAL_COMMUNITY): Payer: Self-pay | Admitting: Oncology

## 2013-03-14 DIAGNOSIS — C9000 Multiple myeloma not having achieved remission: Secondary | ICD-10-CM

## 2013-03-14 MED ORDER — POMALIDOMIDE 1 MG PO CAPS: ORAL_CAPSULE | ORAL | Status: DC

## 2013-03-24 ENCOUNTER — Encounter (HOSPITAL_BASED_OUTPATIENT_CLINIC_OR_DEPARTMENT_OTHER): Payer: PRIVATE HEALTH INSURANCE

## 2013-03-24 ENCOUNTER — Encounter (HOSPITAL_COMMUNITY): Payer: PRIVATE HEALTH INSURANCE | Attending: Oncology

## 2013-03-24 VITALS — BP 152/88 | HR 62

## 2013-03-24 DIAGNOSIS — C9000 Multiple myeloma not having achieved remission: Secondary | ICD-10-CM | POA: Insufficient documentation

## 2013-03-24 DIAGNOSIS — T50995A Adverse effect of other drugs, medicaments and biological substances, initial encounter: Secondary | ICD-10-CM | POA: Insufficient documentation

## 2013-03-24 DIAGNOSIS — D631 Anemia in chronic kidney disease: Secondary | ICD-10-CM

## 2013-03-24 DIAGNOSIS — C9002 Multiple myeloma in relapse: Secondary | ICD-10-CM

## 2013-03-24 DIAGNOSIS — N189 Chronic kidney disease, unspecified: Secondary | ICD-10-CM

## 2013-03-24 LAB — CBC
Hemoglobin: 10.1 g/dL — ABNORMAL LOW (ref 13.0–17.0)
MCH: 34.4 pg — ABNORMAL HIGH (ref 26.0–34.0)
MCHC: 34.4 g/dL (ref 30.0–36.0)
MCV: 100 fL (ref 78.0–100.0)
RBC: 2.94 MIL/uL — ABNORMAL LOW (ref 4.22–5.81)

## 2013-03-24 MED ORDER — DARBEPOETIN ALFA-POLYSORBATE 500 MCG/ML IJ SOLN
500.0000 ug | Freq: Once | INTRAMUSCULAR | Status: AC
  Administered 2013-03-24: 500 ug via SUBCUTANEOUS

## 2013-03-24 MED ORDER — DARBEPOETIN ALFA-POLYSORBATE 500 MCG/ML IJ SOLN
INTRAMUSCULAR | Status: AC
  Filled 2013-03-24: qty 1

## 2013-03-24 NOTE — Progress Notes (Signed)
Labs drawn today for cbc 

## 2013-03-24 NOTE — Progress Notes (Signed)
Valmore C Kraft presents today for injection per MD orders. Aranesp 500 mcg administered SQ in left Abdomen. Administration without incident. Patient tolerated well.  

## 2013-03-29 NOTE — Progress Notes (Signed)
[name], MD [address] [place] [address]  Multiple myeloma - Plan: CBC, CBC with Differential, Comprehensive metabolic panel, Sedimentation rate, Beta 2 microglobuline, serum, C-reactive protein, Multiple myeloma panel, serum, Kappa/lambda light chains  CURRENT THERAPY:Pomalyst every other day x 14 days followed by 7 day respite, then repeated. Dexamethasone D/C'd due to patient reported intolerance. Anaphylactic reaction to bisphosphonate therapy, D/C'd   INTERVAL HISTORY: SIAOSI ALTER 67 y.o. 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 Greggory Stallion at [facility].   He is following up with Dr. Kristian Covey for his renal function. His PCP with the assistance of nephrology are managing the patient's BP medications.   He was seen by Dr. Greggory Stallion at [facility] who helped develop the patient's maintenance program with Pomalyst. He is tolerating this regimen very well. He feels great and his fatigue is much improved since this change.   Knight is doing great.  He denies any complaints and ROS questioning is negative.   He reports that he is going to the gym and doing light weight lifting and working out some.  His goal is to lose some weight, which would be helpful for his health.   We will continue with Aranesp every 3 weeks if his Hgb meets treatment parameters.    Hematologically, he denies any complaints and ROS questioning is negative.    Past Medical History  Diagnosis Date  . Hypertension   . Ruptured cervical disc   . Fall resulting in striking against other object     paralyzed  . Multiple myeloma   . 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 Multiple myeloma 01/26/2011  . Unspecified vitamin D deficiency 03/06/2013    has ESSENTIAL HYPERTENSION, BENIGN; BRADYCARDIA; Multiple myeloma; Intravenous  pamidronate causing adverse effect in therapeutic use; and Unspecified vitamin D deficiency on his problem list.     is allergic to pamidronate.  Mr. Binion had no medications administered during this visit.  Past Surgical History  Procedure Laterality Date  . Inguinal hernia repair    . Anterior cervical decomp/discectomy fusion    . Bone marrow aspirate and biopsy wiith lumbar puncture    . Melanoma excision      rt. breast  . Cervical spine surgery      Denies any headaches, dizziness, double vision, fevers, chills, night sweats, nausea, vomiting, diarrhea, constipation, chest pain, heart palpitations, shortness of breath, blood in stool, black tarry stool, urinary pain, urinary burning, urinary frequency, hematuria.   PHYSICAL EXAMINATION  ECOG PERFORMANCE STATUS: 0 - Asymptomatic  Filed Vitals:   03/30/13 1130  BP: 156/96  Pulse: 62  Temp: 97.9 F (36.6 C)  Resp: 18    GENERAL:alert, no distress, well nourished, well developed, comfortable, cooperative, obese and smiling SKIN: skin color, texture, turgor are normal, no rashes or significant lesions HEAD: Normocephalic, No masses, lesions, tenderness or abnormalities EYES: normal, PERRLA, EOMI, Conjunctiva are pink and non-injected EARS: External ears normal OROPHARYNX:mucous membranes are moist  NECK: supple, no adenopathy, thyroid normal size, non-tender, without nodularity, no stridor, non-tender, trachea midline LYMPH:  no palpable lymphadenopathy, no hepatosplenomegaly BREAST:not examined LUNGS: clear to auscultation and percussion HEART: regular rate & rhythm, no murmurs, no gallops, S1 normal and S2 normal ABDOMEN:abdomen soft, non-tender, obese, normal bowel sounds, no masses or organomegaly and no hepatosplenomegaly  BACK: Back symmetric, no curvature., No CVA tenderness EXTREMITIES:less then 2 second capillary refill, no joint deformities, effusion, or inflammation, no edema, no skin discoloration, no  clubbing, no cyanosis  NEURO: alert & oriented x 3 with fluent speech, no focal motor/sensory deficits, gait normal    LABORATORY DATA: CBC    Component Value Date/Time   WBC 4.3 03/24/2013 0945   RBC 2.94* 03/24/2013 0945   HGB 10.1* 03/24/2013 0945   HCT 29.4* 03/24/2013 0945   PLT 54* 03/24/2013 0945   MCV 100.0 03/24/2013 0945   MCH 34.4* 03/24/2013 0945   MCHC 34.4 03/24/2013 0945   RDW 14.5 03/24/2013 0945   LYMPHSABS 2.2 01/20/2013 0905   MONOABS 0.3 01/20/2013 0905   EOSABS 0.1 01/20/2013 0905   BASOSABS 0.0 01/20/2013 0905      Chemistry      Component Value Date/Time   NA 140 03/03/2013 0903   K 4.7 03/03/2013 0903   CL 107 03/03/2013 0903   CO2 22 03/03/2013 0903   BUN 40* 03/03/2013 0903   CREATININE 3.06* 03/03/2013 0903   CREATININE 43.09* 01/26/2012 1052      Component Value Date/Time   CALCIUM 9.0 03/03/2013 0903   ALKPHOS 46 01/20/2013 0905   AST 19 01/20/2013 0905   ALT 17 01/20/2013 0905   BILITOT 0.6 01/20/2013 0905        ASSESSMENT:  1. 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 Greggory Stallion at [facility]. Pomalyst restarted on September 05, 2012, 7 days on and 7 days off. Dexamethasone D/C'd due to patient reported intolerance.  2. Anaphylactic reaction to bisphosphonate therapy, D/C'd  3. Renal Disease, followed by nephrology 4. Anemia of chronic disease secondary to #1 and #3.  On Aranesp every 3 weeks.   Patient Active Problem List   Diagnosis Date Noted  . Unspecified vitamin D deficiency 03/06/2013  . Intravenous pamidronate causing adverse effect in therapeutic use 10/14/2012  . Multiple myeloma 01/26/2011  . ESSENTIAL HYPERTENSION, BENIGN 03/05/2010  . BRADYCARDIA 03/05/2010     PLAN:  1. I personally reviewed and went over laboratory results with the patient.  2. Labs every 3 weeks: CBC  3. Continue with Aranesp per treatment parameters  4. Labs every 6 weeks: CBC diff, CMET, CRP, MM  panel  5. Continue Pomalyst as described above  6. Continue follow-up with Nephrology and PCP as directed  7. Return in 3 months for follow-up.   THERAPY PLAN:  Amelio is tolerating therapy well.  He denies any complaints.  We will continue with Aranesp therapy and perform MM labs every 6 weeks.  All questions were answered. The patient knows to call the clinic with any problems, questions or concerns. We can certainly see the patient much sooner if necessary.  Patient and plan discussed with Dr. Annamarie Dawley and she is in agreement with the aforementioned.    Chris Narasimhan

## 2013-03-30 ENCOUNTER — Encounter (HOSPITAL_BASED_OUTPATIENT_CLINIC_OR_DEPARTMENT_OTHER): Payer: PRIVATE HEALTH INSURANCE | Admitting: Oncology

## 2013-03-30 ENCOUNTER — Encounter (HOSPITAL_COMMUNITY): Payer: Self-pay | Admitting: Oncology

## 2013-03-30 VITALS — BP 156/96 | HR 62 | Temp 97.9°F | Resp 18 | Wt 249.3 lb

## 2013-03-30 DIAGNOSIS — D638 Anemia in other chronic diseases classified elsewhere: Secondary | ICD-10-CM

## 2013-03-30 DIAGNOSIS — C9000 Multiple myeloma not having achieved remission: Secondary | ICD-10-CM

## 2013-03-30 DIAGNOSIS — C9002 Multiple myeloma in relapse: Secondary | ICD-10-CM

## 2013-03-30 NOTE — Patient Instructions (Signed)
[facility] Discharge Instructions  RECOMMENDATIONS MADE BY THE CONSULTANT AND ANY TEST RESULTS WILL BE SENT TO YOUR REFERRING PHYSICIAN.  EXAM FINDINGS BY THE PHYSICIAN TODAY AND SIGNS OR SYMPTOMS TO REPORT TO CLINIC OR PRIMARY PHYSICIAN: Exam and findings as discussed by Dellis Anes, PA-C.  Will check CBC every 3 weeks and your myeloma panel every 6 weeks.   MEDICATIONS PRESCRIBED:  none  INSTRUCTIONS/FOLLOW-UP: Blood work every 3 weeks and follow-up in 3 months.  Thank you for choosing [facility] to provide your oncology and hematology care.  To afford each patient quality time with our providers, please arrive at least 15 minutes before your scheduled appointment time.  With your help, our goal is to use those 15 minutes to complete the necessary work-up to ensure our physicians have the information they need to help with your evaluation and healthcare recommendations.    Effective January 1st, 2014, we ask that you re-schedule your appointment with our physicians should you arrive 10 or more minutes late for your appointment.  We strive to give you quality time with our providers, and arriving late affects you and other patients whose appointments are after yours.    Again, thank you for choosing [facility].  Our hope is that these requests will decrease the amount of time that you wait before being seen by our physicians.       _____________________________________________________________  Should you have questions after your visit to [facility], please contact our office at [phone] between the hours of 8:30 a.m. and 5:00 p.m.  Voicemails left after 4:30 p.m. will not be returned until the following business day.  For prescription refill requests, have your pharmacy contact our office with your prescription refill request.

## 2013-04-05 ENCOUNTER — Other Ambulatory Visit (HOSPITAL_COMMUNITY): Payer: Self-pay | Admitting: Oncology

## 2013-04-05 DIAGNOSIS — C9000 Multiple myeloma not having achieved remission: Secondary | ICD-10-CM

## 2013-04-05 MED ORDER — POMALIDOMIDE 1 MG PO CAPS: ORAL_CAPSULE | ORAL | Status: DC

## 2013-04-12 ENCOUNTER — Encounter (HOSPITAL_BASED_OUTPATIENT_CLINIC_OR_DEPARTMENT_OTHER): Payer: PRIVATE HEALTH INSURANCE

## 2013-04-12 VITALS — BP 146/90 | HR 56 | Resp 18

## 2013-04-12 DIAGNOSIS — C9000 Multiple myeloma not having achieved remission: Secondary | ICD-10-CM

## 2013-04-12 DIAGNOSIS — D631 Anemia in chronic kidney disease: Secondary | ICD-10-CM

## 2013-04-12 DIAGNOSIS — N189 Chronic kidney disease, unspecified: Secondary | ICD-10-CM

## 2013-04-12 LAB — COMPREHENSIVE METABOLIC PANEL
AST: 17 U/L (ref 0–37)
Alkaline Phosphatase: 53 U/L (ref 39–117)
BUN: 42 mg/dL — ABNORMAL HIGH (ref 6–23)
CO2: 24 mEq/L (ref 19–32)
Calcium: 9.3 mg/dL (ref 8.4–10.5)
Chloride: 106 mEq/L (ref 96–112)
Creatinine, Ser: 3.38 mg/dL — ABNORMAL HIGH (ref 0.50–1.35)
GFR calc non Af Amer: 18 mL/min — ABNORMAL LOW (ref >90)
Glucose, Bld: 105 mg/dL — ABNORMAL HIGH (ref 70–99)
Total Bilirubin: 0.4 mg/dL (ref 0.3–1.2)

## 2013-04-12 LAB — CBC WITH DIFFERENTIAL/PLATELET
Eosinophils Absolute: 0.1 10*3/uL (ref 0.0–0.7)
HCT: 33.6 % — ABNORMAL LOW (ref 39.0–52.0)
Lymphocytes Relative: 52 % — ABNORMAL HIGH (ref 12–46)
Lymphs Abs: 1.8 10*3/uL (ref 0.7–4.0)
Monocytes Absolute: 0.2 10*3/uL (ref 0.1–1.0)
Monocytes Relative: 7 % (ref 3–12)
Neutro Abs: 1.4 10*3/uL — ABNORMAL LOW (ref 1.7–7.7)
Neutrophils Relative %: 39 % — ABNORMAL LOW (ref 43–77)
Platelets: 56 10*3/uL — ABNORMAL LOW (ref 150–400)
RBC: 3.31 MIL/uL — ABNORMAL LOW (ref 4.22–5.81)
WBC: 3.6 10*3/uL — ABNORMAL LOW (ref 4.0–10.5)

## 2013-04-12 LAB — SEDIMENTATION RATE: Sed Rate: 6 mm/hr (ref 0–16)

## 2013-04-12 MED ORDER — DARBEPOETIN ALFA-POLYSORBATE 500 MCG/ML IJ SOLN
500.0000 ug | Freq: Once | INTRAMUSCULAR | Status: AC
  Administered 2013-04-12: 500 ug via SUBCUTANEOUS

## 2013-04-12 MED ORDER — DARBEPOETIN ALFA-POLYSORBATE 500 MCG/ML IJ SOLN
INTRAMUSCULAR | Status: AC
  Filled 2013-04-12: qty 1

## 2013-04-12 NOTE — Progress Notes (Signed)
[name] presents today for injection per MD orders. Aranesp 500 mcg administered SQ in right Abdomen. Administration without incident. Patient tolerated well.  

## 2013-04-12 NOTE — Progress Notes (Signed)
Labs drawn today for kllc,mm,cbc/diff,sed rate,cmp,crp,b39mic

## 2013-04-14 LAB — KAPPA/LAMBDA LIGHT CHAINS: Lambda free light chains: 2.53 mg/dL (ref 0.57–2.63)

## 2013-04-17 LAB — MULTIPLE MYELOMA PANEL, SERUM
Albumin ELP: 61.8 % (ref 55.8–66.1)
Alpha-1-Globulin: 7.4 % — ABNORMAL HIGH (ref 2.9–4.9)
Alpha-2-Globulin: 9.4 % (ref 7.1–11.8)
Beta 2: 3.4 % (ref 3.2–6.5)
Beta Globulin: 6.2 % (ref 4.7–7.2)
IgA: 71 mg/dL (ref 68–379)
IgM, Serum: 86 mg/dL (ref 41–251)
Total Protein: 6.4 g/dL (ref 6.0–8.3)

## 2013-04-21 ENCOUNTER — Other Ambulatory Visit (HOSPITAL_COMMUNITY): Payer: Self-pay | Admitting: Oncology

## 2013-04-21 DIAGNOSIS — C9000 Multiple myeloma not having achieved remission: Secondary | ICD-10-CM

## 2013-04-21 MED ORDER — POMALIDOMIDE 1 MG PO CAPS: ORAL_CAPSULE | ORAL | Status: DC

## 2013-05-03 ENCOUNTER — Ambulatory Visit (HOSPITAL_COMMUNITY): Payer: PRIVATE HEALTH INSURANCE

## 2013-05-03 ENCOUNTER — Encounter (HOSPITAL_COMMUNITY): Payer: PRIVATE HEALTH INSURANCE | Attending: Oncology

## 2013-05-03 DIAGNOSIS — T50995A Adverse effect of other drugs, medicaments and biological substances, initial encounter: Secondary | ICD-10-CM | POA: Insufficient documentation

## 2013-05-03 DIAGNOSIS — C9000 Multiple myeloma not having achieved remission: Secondary | ICD-10-CM | POA: Insufficient documentation

## 2013-05-03 LAB — CBC
Hemoglobin: 12.4 g/dL — ABNORMAL LOW (ref 13.0–17.0)
MCHC: 32.8 g/dL (ref 30.0–36.0)
MCV: 103.6 fL — ABNORMAL HIGH (ref 78.0–100.0)
Platelets: 55 10*3/uL — ABNORMAL LOW (ref 150–400)
RDW: 15.3 % (ref 11.5–15.5)

## 2013-05-03 NOTE — Progress Notes (Signed)
Hgb results 12.4 - Aranesp injection held.

## 2013-05-03 NOTE — Progress Notes (Signed)
[name] presented for labwork. Labs per MD order drawn via Peripheral Line 23 gauge needle inserted in right antecubital  Good blood return present. Procedure without incident.  Needle removed intact. Patient tolerated procedure well.

## 2013-05-10 ENCOUNTER — Encounter: Payer: Self-pay | Admitting: Oncology

## 2013-05-16 ENCOUNTER — Other Ambulatory Visit (HOSPITAL_COMMUNITY): Payer: Self-pay | Admitting: Oncology

## 2013-05-16 DIAGNOSIS — C9000 Multiple myeloma not having achieved remission: Secondary | ICD-10-CM

## 2013-05-16 MED ORDER — POMALIDOMIDE 1 MG PO CAPS: ORAL_CAPSULE | ORAL | Status: DC

## 2013-05-24 ENCOUNTER — Encounter (HOSPITAL_COMMUNITY): Payer: PRIVATE HEALTH INSURANCE | Attending: Oncology

## 2013-05-24 DIAGNOSIS — C9000 Multiple myeloma not having achieved remission: Secondary | ICD-10-CM | POA: Insufficient documentation

## 2013-05-24 DIAGNOSIS — E559 Vitamin D deficiency, unspecified: Secondary | ICD-10-CM | POA: Insufficient documentation

## 2013-05-24 DIAGNOSIS — T50995A Adverse effect of other drugs, medicaments and biological substances, initial encounter: Secondary | ICD-10-CM | POA: Insufficient documentation

## 2013-05-24 LAB — COMPREHENSIVE METABOLIC PANEL
ALK PHOS: 46 U/L (ref 39–117)
ALT: 23 U/L (ref 0–53)
AST: 22 U/L (ref 0–37)
Albumin: 3.7 g/dL (ref 3.5–5.2)
BILIRUBIN TOTAL: 0.5 mg/dL (ref 0.3–1.2)
BUN: 47 mg/dL — AB (ref 6–23)
CHLORIDE: 107 meq/L (ref 96–112)
CO2: 23 mEq/L (ref 19–32)
Calcium: 9.5 mg/dL (ref 8.4–10.5)
Creatinine, Ser: 3.61 mg/dL — ABNORMAL HIGH (ref 0.50–1.35)
GFR, EST AFRICAN AMERICAN: 19 mL/min — AB (ref >90)
GFR, EST NON AFRICAN AMERICAN: 16 mL/min — AB (ref >90)
GLUCOSE: 105 mg/dL — AB (ref 70–99)
POTASSIUM: 5.8 meq/L — AB (ref 3.7–5.3)
Sodium: 144 mEq/L (ref 137–147)
Total Protein: 7.1 g/dL (ref 6.0–8.3)

## 2013-05-24 LAB — CBC WITH DIFFERENTIAL/PLATELET
BASOS PCT: 1 % (ref 0–1)
Basophils Absolute: 0 10*3/uL (ref 0.0–0.1)
Eosinophils Absolute: 0.1 10*3/uL (ref 0.0–0.7)
Eosinophils Relative: 3 % (ref 0–5)
HEMATOCRIT: 34.5 % — AB (ref 39.0–52.0)
HEMOGLOBIN: 11.5 g/dL — AB (ref 13.0–17.0)
LYMPHS ABS: 2 10*3/uL (ref 0.7–4.0)
Lymphocytes Relative: 48 % — ABNORMAL HIGH (ref 12–46)
MCH: 33.5 pg (ref 26.0–34.0)
MCHC: 33.3 g/dL (ref 30.0–36.0)
MCV: 100.6 fL — ABNORMAL HIGH (ref 78.0–100.0)
MONO ABS: 0.4 10*3/uL (ref 0.1–1.0)
MONOS PCT: 9 % (ref 3–12)
NEUTROS ABS: 1.7 10*3/uL (ref 1.7–7.7)
Neutrophils Relative %: 40 % — ABNORMAL LOW (ref 43–77)
Platelets: 65 10*3/uL — ABNORMAL LOW (ref 150–400)
RBC: 3.43 MIL/uL — AB (ref 4.22–5.81)
RDW: 14.2 % (ref 11.5–15.5)
WBC: 4.1 10*3/uL (ref 4.0–10.5)

## 2013-05-24 LAB — SEDIMENTATION RATE: Sed Rate: 12 mm/hr (ref 0–16)

## 2013-05-24 LAB — C-REACTIVE PROTEIN: CRP: <0.5 mg/dL — ABNORMAL LOW (ref <0.60)

## 2013-05-24 NOTE — Progress Notes (Signed)
Labs drawn today for b37mic,crp,cbc/diff,cmp,kllc,mm,sed rate,vd25

## 2013-05-25 LAB — BETA 2 MICROGLOBULIN, SERUM: Beta-2 Microglobulin: 7.1 mg/L — ABNORMAL HIGH (ref 1.01–1.73)

## 2013-05-25 LAB — KAPPA/LAMBDA LIGHT CHAINS
KAPPA FREE LGHT CHN: 4.66 mg/dL — AB (ref 0.33–1.94)
Kappa, lambda light chain ratio: 1.83 — ABNORMAL HIGH (ref 0.26–1.65)
LAMDA FREE LIGHT CHAINS: 2.55 mg/dL (ref 0.57–2.63)

## 2013-05-25 LAB — VITAMIN D 25 HYDROXY (VIT D DEFICIENCY, FRACTURES): VIT D 25 HYDROXY: 24 ng/mL — AB (ref 30–89)

## 2013-05-26 LAB — MULTIPLE MYELOMA PANEL, SERUM
Albumin ELP: 64.3 % (ref 55.8–66.1)
Alpha-1-Globulin: 4.4 % (ref 2.9–4.9)
Alpha-2-Globulin: 9.5 % (ref 7.1–11.8)
BETA 2: 4.1 % (ref 3.2–6.5)
BETA GLOBULIN: 5.9 % (ref 4.7–7.2)
GAMMA GLOBULIN: 11.8 % (ref 11.1–18.8)
IgA: 72 mg/dL (ref 68–379)
IgG (Immunoglobin G), Serum: 797 mg/dL (ref 650–1600)
IgM, Serum: 80 mg/dL (ref 41–251)
M-SPIKE, %: NOT DETECTED g/dL
Total Protein: 6.7 g/dL (ref 6.0–8.3)

## 2013-06-06 ENCOUNTER — Other Ambulatory Visit (HOSPITAL_COMMUNITY): Payer: Self-pay | Admitting: Oncology

## 2013-06-06 ENCOUNTER — Other Ambulatory Visit (HOSPITAL_COMMUNITY): Payer: PRIVATE HEALTH INSURANCE

## 2013-06-06 DIAGNOSIS — C9000 Multiple myeloma not having achieved remission: Secondary | ICD-10-CM

## 2013-06-06 MED ORDER — POMALIDOMIDE 1 MG PO CAPS: ORAL_CAPSULE | ORAL | Status: DC

## 2013-06-09 ENCOUNTER — Other Ambulatory Visit (HOSPITAL_COMMUNITY): Payer: Self-pay | Admitting: Oncology

## 2013-06-09 DIAGNOSIS — C9000 Multiple myeloma not having achieved remission: Secondary | ICD-10-CM

## 2013-06-09 MED ORDER — POMALIDOMIDE 1 MG PO CAPS: ORAL_CAPSULE | ORAL | Status: DC

## 2013-06-14 ENCOUNTER — Encounter (HOSPITAL_BASED_OUTPATIENT_CLINIC_OR_DEPARTMENT_OTHER): Payer: PRIVATE HEALTH INSURANCE

## 2013-06-14 VITALS — BP 133/82 | HR 57 | Resp 18

## 2013-06-14 DIAGNOSIS — N039 Chronic nephritic syndrome with unspecified morphologic changes: Secondary | ICD-10-CM

## 2013-06-14 DIAGNOSIS — C9000 Multiple myeloma not having achieved remission: Secondary | ICD-10-CM

## 2013-06-14 DIAGNOSIS — D631 Anemia in chronic kidney disease: Secondary | ICD-10-CM

## 2013-06-14 DIAGNOSIS — N189 Chronic kidney disease, unspecified: Secondary | ICD-10-CM

## 2013-06-14 LAB — CBC
HCT: 31.5 % — ABNORMAL LOW (ref 39.0–52.0)
Hemoglobin: 10.9 g/dL — ABNORMAL LOW (ref 13.0–17.0)
MCH: 34.5 pg — AB (ref 26.0–34.0)
MCHC: 34.6 g/dL (ref 30.0–36.0)
MCV: 99.7 fL (ref 78.0–100.0)
Platelets: 55 10*3/uL — ABNORMAL LOW (ref 150–400)
RBC: 3.16 MIL/uL — ABNORMAL LOW (ref 4.22–5.81)
RDW: 14.1 % (ref 11.5–15.5)
WBC: 4.6 10*3/uL (ref 4.0–10.5)

## 2013-06-14 MED ORDER — DARBEPOETIN ALFA-POLYSORBATE 500 MCG/ML IJ SOLN
INTRAMUSCULAR | Status: AC
  Filled 2013-06-14: qty 1

## 2013-06-14 MED ORDER — DARBEPOETIN ALFA-POLYSORBATE 500 MCG/ML IJ SOLN
500.0000 ug | Freq: Once | INTRAMUSCULAR | Status: AC
Start: 2013-06-14 — End: 2013-06-14
  Administered 2013-06-14: 500 ug via SUBCUTANEOUS

## 2013-06-14 NOTE — Progress Notes (Signed)
Reo C Puzio presents today for injection per MD orders. Aranesp 500 mcg administered SQ in left Abdomen. Administration without incident. Patient tolerated well.  

## 2013-06-14 NOTE — Progress Notes (Signed)
Labs drawn today for cbc 

## 2013-06-27 ENCOUNTER — Other Ambulatory Visit (HOSPITAL_COMMUNITY): Payer: Self-pay | Admitting: Oncology

## 2013-06-27 DIAGNOSIS — C9000 Multiple myeloma not having achieved remission: Secondary | ICD-10-CM

## 2013-06-27 MED ORDER — POMALIDOMIDE 1 MG PO CAPS: ORAL_CAPSULE | ORAL | Status: DC

## 2013-06-29 ENCOUNTER — Ambulatory Visit (HOSPITAL_COMMUNITY): Payer: PRIVATE HEALTH INSURANCE | Admitting: Oncology

## 2013-07-03 ENCOUNTER — Telehealth (HOSPITAL_COMMUNITY): Payer: Self-pay | Admitting: Oncology

## 2013-07-03 DIAGNOSIS — E559 Vitamin D deficiency, unspecified: Secondary | ICD-10-CM

## 2013-07-03 DIAGNOSIS — C9000 Multiple myeloma not having achieved remission: Secondary | ICD-10-CM

## 2013-07-03 NOTE — Telephone Encounter (Signed)
[name] wanted to have his lipids checked this week with his labs.  He is not on cholesterol medication and I recommended we wait another 2 months since they were last done on 03/03/2013.  He is agreeable.  He wants his Vit D level checked and so I have added that lab work to his upcoming lab tests.  Additionally, he would like to reschedule his upcoming appointment on Wednesday to Friday due to the upcoming inclement weather.  This is communicated with Lendell Caprice.  KEFALAS,THOMAS

## 2013-07-03 NOTE — Telephone Encounter (Signed)
No answer.  Message left on voicemail.  [name]

## 2013-07-05 ENCOUNTER — Encounter (HOSPITAL_COMMUNITY): Payer: PRIVATE HEALTH INSURANCE

## 2013-07-05 ENCOUNTER — Ambulatory Visit (HOSPITAL_COMMUNITY): Payer: PRIVATE HEALTH INSURANCE | Admitting: Oncology

## 2013-07-07 ENCOUNTER — Encounter (HOSPITAL_COMMUNITY): Payer: PRIVATE HEALTH INSURANCE | Attending: Oncology | Admitting: Oncology

## 2013-07-07 ENCOUNTER — Encounter (HOSPITAL_COMMUNITY): Payer: Self-pay | Admitting: Oncology

## 2013-07-07 ENCOUNTER — Encounter (HOSPITAL_COMMUNITY): Payer: PRIVATE HEALTH INSURANCE

## 2013-07-07 ENCOUNTER — Encounter (HOSPITAL_BASED_OUTPATIENT_CLINIC_OR_DEPARTMENT_OTHER): Payer: PRIVATE HEALTH INSURANCE

## 2013-07-07 VITALS — BP 136/86 | HR 54 | Temp 97.7°F | Resp 18 | Wt 248.7 lb

## 2013-07-07 DIAGNOSIS — C9000 Multiple myeloma not having achieved remission: Secondary | ICD-10-CM

## 2013-07-07 DIAGNOSIS — T50995A Adverse effect of other drugs, medicaments and biological substances, initial encounter: Secondary | ICD-10-CM | POA: Insufficient documentation

## 2013-07-07 DIAGNOSIS — N184 Chronic kidney disease, stage 4 (severe): Secondary | ICD-10-CM

## 2013-07-07 DIAGNOSIS — D638 Anemia in other chronic diseases classified elsewhere: Secondary | ICD-10-CM

## 2013-07-07 DIAGNOSIS — E559 Vitamin D deficiency, unspecified: Secondary | ICD-10-CM

## 2013-07-07 DIAGNOSIS — C9002 Multiple myeloma in relapse: Secondary | ICD-10-CM

## 2013-07-07 LAB — COMPREHENSIVE METABOLIC PANEL
ALK PHOS: 43 U/L (ref 39–117)
ALT: 22 U/L (ref 0–53)
AST: 22 U/L (ref 0–37)
Albumin: 3.4 g/dL — ABNORMAL LOW (ref 3.5–5.2)
BILIRUBIN TOTAL: 0.4 mg/dL (ref 0.3–1.2)
BUN: 51 mg/dL — ABNORMAL HIGH (ref 6–23)
CO2: 24 meq/L (ref 19–32)
CREATININE: 3.57 mg/dL — AB (ref 0.50–1.35)
Calcium: 8.5 mg/dL (ref 8.4–10.5)
Chloride: 105 mEq/L (ref 96–112)
GFR calc Af Amer: 19 mL/min — ABNORMAL LOW (ref >90)
GFR, EST NON AFRICAN AMERICAN: 17 mL/min — AB (ref >90)
Glucose, Bld: 133 mg/dL — ABNORMAL HIGH (ref 70–99)
POTASSIUM: 4.7 meq/L (ref 3.7–5.3)
Sodium: 141 mEq/L (ref 137–147)
Total Protein: 6.2 g/dL (ref 6.0–8.3)

## 2013-07-07 LAB — CBC WITH DIFFERENTIAL/PLATELET
Basophils Absolute: 0 10*3/uL (ref 0.0–0.1)
Basophils Relative: 0 % (ref 0–1)
Eosinophils Absolute: 0.1 10*3/uL (ref 0.0–0.7)
Eosinophils Relative: 3 % (ref 0–5)
HCT: 33.9 % — ABNORMAL LOW (ref 39.0–52.0)
Hemoglobin: 11.3 g/dL — ABNORMAL LOW (ref 13.0–17.0)
Lymphocytes Relative: 51 % — ABNORMAL HIGH (ref 12–46)
Lymphs Abs: 2 10*3/uL (ref 0.7–4.0)
MCH: 33.7 pg (ref 26.0–34.0)
MCHC: 33.3 g/dL (ref 30.0–36.0)
MCV: 101.2 fL — ABNORMAL HIGH (ref 78.0–100.0)
Monocytes Absolute: 0.4 10*3/uL (ref 0.1–1.0)
Monocytes Relative: 10 % (ref 3–12)
Neutro Abs: 1.4 10*3/uL — ABNORMAL LOW (ref 1.7–7.7)
Neutrophils Relative %: 36 % — ABNORMAL LOW (ref 43–77)
Platelets: 51 10*3/uL — ABNORMAL LOW (ref 150–400)
RBC: 3.35 MIL/uL — ABNORMAL LOW (ref 4.22–5.81)
RDW: 14.7 % (ref 11.5–15.5)
WBC: 3.8 10*3/uL — ABNORMAL LOW (ref 4.0–10.5)

## 2013-07-07 LAB — C-REACTIVE PROTEIN: CRP: <0.5 mg/dL — ABNORMAL LOW (ref <0.60)

## 2013-07-07 LAB — SEDIMENTATION RATE: SED RATE: 10 mm/h (ref 0–16)

## 2013-07-07 MED ORDER — DARBEPOETIN ALFA-POLYSORBATE 500 MCG/ML IJ SOLN
INTRAMUSCULAR | Status: AC
  Filled 2013-07-07: qty 1

## 2013-07-07 MED ORDER — DARBEPOETIN ALFA-POLYSORBATE 500 MCG/ML IJ SOLN
500.0000 ug | Freq: Once | INTRAMUSCULAR | Status: AC
  Administered 2013-07-07: 500 ug via SUBCUTANEOUS

## 2013-07-07 NOTE — Patient Instructions (Signed)
[facility] Discharge Instructions  RECOMMENDATIONS MADE BY THE CONSULTANT AND ANY TEST RESULTS WILL BE SENT TO YOUR REFERRING PHYSICIAN.  EXAM FINDINGS BY THE PHYSICIAN TODAY AND SIGNS OR SYMPTOMS TO REPORT TO CLINIC OR PRIMARY PHYSICIAN: Exam and findings as discussed by Robynn Pane, PA-C.  Your last labs were stable.  Your hemoglobin is 11.3 and you will receive your aranesp today.  MEDICATIONS PRESCRIBED:  none  INSTRUCTIONS/FOLLOW-UP: Labs every 3 weeks and follow-up in 3 months.  Thank you for choosing [facility] to provide your oncology and hematology care.  To afford each patient quality time with our providers, please arrive at least 15 minutes before your scheduled appointment time.  With your help, our goal is to use those 15 minutes to complete the necessary work-up to ensure our physicians have the information they need to help with your evaluation and healthcare recommendations.    Effective January 1st, 2014, we ask that you re-schedule your appointment with our physicians should you arrive 10 or more minutes late for your appointment.  We strive to give you quality time with our providers, and arriving late affects you and other patients whose appointments are after yours.    Again, thank you for choosing [facility].  Our hope is that these requests will decrease the amount of time that you wait before being seen by our physicians.       _____________________________________________________________  Should you have questions after your visit to [facility], please contact our office at [phone] between the hours of 8:30 a.m. and 5:00 p.m.  Voicemails left after 4:30 p.m. will not be returned until the following business day.  For prescription refill requests, have your pharmacy contact our office with your prescription refill request.

## 2013-07-07 NOTE — Progress Notes (Signed)
[name] presents today for injection per MD orders. Aranesp 500 mcg administered SQ in right Abdomen. Administration without incident. Patient tolerated well.  

## 2013-07-07 NOTE — Progress Notes (Signed)
Labs drawn and sent to the lab. Pt. Tolerated well.

## 2013-07-07 NOTE — Progress Notes (Signed)
MCGOUGH,WILLIAM M, MD [address] [place] [address]  Multiple myeloma - Plan: amLODipine (NORVASC) 5 MG tablet, carvedilol (COREG) 6.25 MG tablet, magnesium hydroxide (MILK OF MAGNESIA) 400 MG/5ML suspension, darbepoetin alfa-polysorbate (ARANESP) injection 500 mcg, DG Bone Survey Met  CURRENT THERAPY:Pomalyst every other day x 14 days followed by 7 day respite, then repeated. Dexamethasone D/C'd due to patient reported intolerance. Anaphylactic reaction to bisphosphonate therapy, D/C'd  INTERVAL HISTORY: [name] 68 y.o. 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 Marcell Anger at [facility].  I personally reviewed and went over laboratory results with the patient.  The results are noted within this dictation.  His Hgb is 11.3 g/dL and therefore he meets Aranespt treatment today.  His parameter calls for holding of Aranesp for Hgb > 11.4 g/dL.  He denies any complaints and he feels great.  ROS questioning is negative.   He is walking at the [facility], nearly 2 miles daily.  He is attempting to be as active as possible.  He does not have many questions today and with a lack of complaints we discussed other things such unrelated to malignancy.   He is in a good talkative mood today.  Past Medical History  Diagnosis Date  . Hypertension   . Ruptured cervical disc   . Fall resulting in striking against other object     paralyzed  . Multiple myeloma   . 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 Multiple myeloma 01/26/2011  . Unspecified vitamin D deficiency 03/06/2013    has ESSENTIAL HYPERTENSION, BENIGN; BRADYCARDIA; Multiple myeloma; Intravenous pamidronate causing adverse effect in therapeutic use; and Unspecified vitamin D deficiency on his problem list.     is allergic to pamidronate.  We administered  darbepoetin alfa-polysorbate.  Past Surgical History  Procedure Laterality Date  . Inguinal hernia repair    . Anterior cervical decomp/discectomy fusion    . Bone marrow aspirate and biopsy wiith lumbar puncture    . Melanoma excision      rt. breast  . Cervical spine surgery      Denies any headaches, dizziness, double vision, fevers, chills, night sweats, nausea, vomiting, diarrhea, constipation, chest pain, heart palpitations, shortness of breath, blood in stool, black tarry stool, urinary pain, urinary burning, urinary frequency, hematuria.   PHYSICAL EXAMINATION  ECOG PERFORMANCE STATUS: 0 - Asymptomatic  Filed Vitals:   07/07/13 1240  BP: 151/85  Pulse: 54  Temp: 97.7 F (36.5 C)  Resp: 18    GENERAL:alert, no distress, well nourished, well developed, comfortable, cooperative, obese and smiling SKIN: skin color, texture, turgor are normal, no rashes or significant lesions HEAD: Normocephalic, No masses, lesions, tenderness or abnormalities EYES: normal, PERRLA, EOMI, Conjunctiva are pink and non-injected EARS: External ears normal OROPHARYNX:mucous membranes are moist  NECK: supple, no adenopathy, thyroid normal size, non-tender, without nodularity, no stridor, non-tender, trachea midline LYMPH:  no palpable lymphadenopathy BREAST:not examined LUNGS: clear to auscultation and percussion HEART: regular rate & rhythm, no murmurs, no gallops, S1 normal and S2 normal ABDOMEN:abdomen soft, non-tender, obese and normal bowel sounds BACK: Back symmetric, no curvature. EXTREMITIES:less then 2 second capillary refill, no joint deformities, effusion, or inflammation, no edema, no skin discoloration, no clubbing, no cyanosis  NEURO: alert & oriented x 3 with fluent speech, no focal motor/sensory deficits,  gait normal   LABORATORY DATA: CBC    Component Value Date/Time   WBC 3.8* 07/07/2013 0912   RBC 3.35* 07/07/2013 0912   HGB 11.3* 07/07/2013 0912   HCT 33.9* 07/07/2013  0912   PLT 51* 07/07/2013 0912   MCV 101.2* 07/07/2013 0912   MCH 33.7 07/07/2013 0912   MCHC 33.3 07/07/2013 0912   RDW 14.7 07/07/2013 0912   LYMPHSABS 2.0 07/07/2013 0912   MONOABS 0.4 07/07/2013 0912   EOSABS 0.1 07/07/2013 0912   BASOSABS 0.0 07/07/2013 0912      Chemistry      Component Value Date/Time   NA 141 07/07/2013 0912   K 4.7 07/07/2013 0912   CL 105 07/07/2013 0912   CO2 24 07/07/2013 0912   BUN 51* 07/07/2013 0912   CREATININE 3.57* 07/07/2013 0912   CREATININE 43.09* 01/26/2012 1052      Component Value Date/Time   CALCIUM 8.5 07/07/2013 0912   ALKPHOS 43 07/07/2013 0912   AST 22 07/07/2013 0912   ALT 22 07/07/2013 0912   BILITOT 0.4 07/07/2013 0912     Results for [name], [name] (MRN [id number]) as of 07/07/2013 13:17  Ref. Range 05/24/2013 09:17  Beta-2 Microglobulin Latest Range: 1.01-1.73 mg/L 7.10 (H)  CRP Latest Range: <0.60 mg/dL <0.5 (L)  Vit D, 25-Hydroxy Latest Range: 30-89 ng/mL 24 (L)  Albumin ELP Latest Range: 55.8-66.1 % 64.3  Alpha-1-Globulin Latest Range: 2.9-4.9 % 4.4  Alpha-2-Globulin Latest Range: 7.1-11.8 % 9.5  Beta Globulin Latest Range: 4.7-7.2 % 5.9  Beta 2 Latest Range: 3.2-6.5 % 4.1  Gamma Globulin Latest Range: 11.1-18.8 % 11.8  M-SPIKE, % No range found NOT DETECTED  SPE Interp. No range found (NOTE)  Comment No range found (NOTE)  IgG (Immunoglobin G), Serum Latest Range: 801-807-8452 mg/dL 797  IgA Latest Range: 68-379 mg/dL 72  IgM, Serum Latest Range: 41-251 mg/dL 80  Kappa free light chain Latest Range: 0.33-1.94 mg/dL 4.66 (H)  Lamda free light chains Latest Range: 0.57-2.63 mg/dL 2.55  Kappa, lamda light chain ratio Latest Range: 0.26-1.65  1.83 (H)     PENDING LABS: MM panel, Vit D level, B2M, CRP    ASSESSMENT:  1. 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 Marcell Anger at [place] restarted on September 05, 2012, 7 days on and 7 days off. Dexamethasone  D/C'd due to patient reported intolerance.  2. Anaphylactic reaction to bisphosphonate therapy, D/C'd  3. Chronic Kidney disease, grade 4, followed by nephrology  4. Anemia of chronic disease secondary to #1 and #3. On Aranesp every 3 weeks  Patient Active Problem List   Diagnosis Date Noted  . Unspecified vitamin D deficiency 03/06/2013  . Intravenous pamidronate causing adverse effect in therapeutic use 10/14/2012  . Multiple myeloma 01/26/2011  . ESSENTIAL HYPERTENSION, BENIGN 03/05/2010  . BRADYCARDIA 03/05/2010    PLAN:  1. I personally reviewed and went over laboratory results with the patient.  The results are noted within this dictation. 2. Labs today: CBC diff, CMET, MM panel, CRP, B2M, Vit D level 3. Aranesp today 4. Supportive therapy plan reviewed. 5. Labs every 3 weeks: CBC 6. Labs every 6 weeks: CBC diff, CMET, CRP, MM panel, B2M 7. Annual bone survey in March 2015 8. Return in 3 months for follow-up   THERAPY PLAN:  He will continue with Pomalyst as prescribed and outlined above.  We will monitor his labs and support his counts.  We will monitor multiple myeloma markers and be on the look out for relapse.  All questions were answered. The patient knows to call the clinic with any problems, questions or concerns. We can certainly see the patient much sooner if necessary.  Patient and plan discussed with Dr. Farrel Gobble and he is in agreement with the aforementioned.   [name]

## 2013-07-08 LAB — VITAMIN D 25 HYDROXY (VIT D DEFICIENCY, FRACTURES): Vit D, 25-Hydroxy: 23 ng/mL — ABNORMAL LOW (ref 30–89)

## 2013-07-10 LAB — BETA 2 MICROGLOBULIN, SERUM: Beta-2 Microglobulin: 8.07 mg/L — ABNORMAL HIGH (ref 1.01–1.73)

## 2013-07-11 LAB — MULTIPLE MYELOMA PANEL, SERUM
ALBUMIN ELP: 64.4 % (ref 55.8–66.1)
Alpha-1-Globulin: 4.6 % (ref 2.9–4.9)
Alpha-2-Globulin: 9.3 % (ref 7.1–11.8)
BETA 2: 4 % (ref 3.2–6.5)
Beta Globulin: 5.8 % (ref 4.7–7.2)
GAMMA GLOBULIN: 11.9 % (ref 11.1–18.8)
IgA: 72 mg/dL (ref 68–379)
IgG (Immunoglobin G), Serum: 708 mg/dL (ref 650–1600)
IgM, Serum: 70 mg/dL (ref 41–251)
M-Spike, %: NOT DETECTED g/dL
Total Protein: 5.9 g/dL — ABNORMAL LOW (ref 6.0–8.3)

## 2013-07-11 LAB — KAPPA/LAMBDA LIGHT CHAINS
KAPPA, LAMDA LIGHT CHAIN RATIO: 1.64 (ref 0.26–1.65)
Kappa free light chain: 5.25 mg/dL — ABNORMAL HIGH (ref 0.33–1.94)
LAMDA FREE LIGHT CHAINS: 3.21 mg/dL — AB (ref 0.57–2.63)

## 2013-07-12 ENCOUNTER — Other Ambulatory Visit (HOSPITAL_COMMUNITY): Payer: Self-pay | Admitting: Oncology

## 2013-07-12 ENCOUNTER — Telehealth (HOSPITAL_COMMUNITY): Payer: Self-pay | Admitting: Oncology

## 2013-07-12 DIAGNOSIS — C9000 Multiple myeloma not having achieved remission: Secondary | ICD-10-CM

## 2013-07-12 MED ORDER — POMALIDOMIDE 1 MG PO CAPS: ORAL_CAPSULE | ORAL | Status: DC

## 2013-07-12 NOTE — Telephone Encounter (Signed)
Patient called requesting a return telephone.  He wanted to review his lab work.  He labs are stable.   Adeliz Tonkinson

## 2013-07-28 ENCOUNTER — Encounter (HOSPITAL_COMMUNITY): Payer: PRIVATE HEALTH INSURANCE | Attending: Oncology

## 2013-07-28 ENCOUNTER — Encounter (HOSPITAL_BASED_OUTPATIENT_CLINIC_OR_DEPARTMENT_OTHER): Payer: PRIVATE HEALTH INSURANCE

## 2013-07-28 ENCOUNTER — Ambulatory Visit (HOSPITAL_COMMUNITY)
Admission: RE | Admit: 2013-07-28 | Discharge: 2013-07-28 | Disposition: A | Discharge status: Home / Self Care | Payer: PRIVATE HEALTH INSURANCE | Source: Ambulatory Visit | Attending: Oncology | Admitting: Oncology

## 2013-07-28 VITALS — BP 139/83 | HR 57 | Resp 16

## 2013-07-28 DIAGNOSIS — D631 Anemia in chronic kidney disease: Secondary | ICD-10-CM

## 2013-07-28 DIAGNOSIS — C9000 Multiple myeloma not having achieved remission: Secondary | ICD-10-CM

## 2013-07-28 DIAGNOSIS — T50995A Adverse effect of other drugs, medicaments and biological substances, initial encounter: Secondary | ICD-10-CM | POA: Insufficient documentation

## 2013-07-28 DIAGNOSIS — N184 Chronic kidney disease, stage 4 (severe): Secondary | ICD-10-CM

## 2013-07-28 DIAGNOSIS — D638 Anemia in other chronic diseases classified elsewhere: Secondary | ICD-10-CM

## 2013-07-28 DIAGNOSIS — N039 Chronic nephritic syndrome with unspecified morphologic changes: Secondary | ICD-10-CM

## 2013-07-28 LAB — CBC
HEMATOCRIT: 31.3 % — AB (ref 39.0–52.0)
HEMOGLOBIN: 10.7 g/dL — AB (ref 13.0–17.0)
MCH: 34.1 pg — ABNORMAL HIGH (ref 26.0–34.0)
MCHC: 34.2 g/dL (ref 30.0–36.0)
MCV: 99.7 fL (ref 78.0–100.0)
Platelets: 33 10*3/uL — ABNORMAL LOW (ref 150–400)
RBC: 3.14 MIL/uL — ABNORMAL LOW (ref 4.22–5.81)
RDW: 14.6 % (ref 11.5–15.5)
WBC: 2.8 10*3/uL — AB (ref 4.0–10.5)

## 2013-07-28 MED ORDER — DARBEPOETIN ALFA-POLYSORBATE 500 MCG/ML IJ SOLN
INTRAMUSCULAR | Status: AC
  Filled 2013-07-28: qty 1

## 2013-07-28 MED ORDER — DARBEPOETIN ALFA-POLYSORBATE 500 MCG/ML IJ SOLN
500.0000 ug | Freq: Once | INTRAMUSCULAR | Status: AC
  Administered 2013-07-28: 500 ug via SUBCUTANEOUS

## 2013-07-28 NOTE — Progress Notes (Signed)
Labs drawn today for cbc 

## 2013-07-28 NOTE — Progress Notes (Signed)
[name] presents today for injection per MD orders. Aranesp 500 mcg administered SQ in left Abdomen. Administration without incident. Patient tolerated well.  

## 2013-08-01 ENCOUNTER — Ambulatory Visit (HOSPITAL_COMMUNITY): Payer: PRIVATE HEALTH INSURANCE | Admitting: Physical Therapy

## 2013-08-04 ENCOUNTER — Other Ambulatory Visit (HOSPITAL_COMMUNITY): Payer: Self-pay | Admitting: Oncology

## 2013-08-04 DIAGNOSIS — C9000 Multiple myeloma not having achieved remission: Secondary | ICD-10-CM

## 2013-08-04 MED ORDER — POMALIDOMIDE 1 MG PO CAPS: ORAL_CAPSULE | ORAL | Status: DC

## 2013-08-08 ENCOUNTER — Ambulatory Visit (HOSPITAL_COMMUNITY)
Admission: RE | Admit: 2013-08-08 | Discharge: 2013-08-08 | Disposition: A | Discharge status: Home / Self Care | Payer: PRIVATE HEALTH INSURANCE | Source: Ambulatory Visit | Attending: Oncology | Admitting: Oncology

## 2013-08-08 DIAGNOSIS — I10 Essential (primary) hypertension: Secondary | ICD-10-CM | POA: Insufficient documentation

## 2013-08-08 DIAGNOSIS — I890 Lymphedema, not elsewhere classified: Secondary | ICD-10-CM | POA: Insufficient documentation

## 2013-08-08 DIAGNOSIS — Z9481 Bone marrow transplant status: Secondary | ICD-10-CM | POA: Insufficient documentation

## 2013-08-08 DIAGNOSIS — IMO0001 Reserved for inherently not codable concepts without codable children: Secondary | ICD-10-CM | POA: Insufficient documentation

## 2013-08-08 NOTE — Evaluation (Signed)
Physical Therapy Evaluation  Patient Details  Name: [name] MRN: [id number] Date of Birth: 1945/06/11  Today's Date: 08/08/2013 Time: 1345-1430 PT Time Calculation (min): 45 min Charge:  eval             Visit#: 1 of 12  Re-eval: 09/07/13 Assessment Diagnosis: lymphedema Prior Therapy: none  Authorization: [employer] medicare     Past Medical History:  Past Medical History  Diagnosis Date  . Hypertension   . Ruptured cervical disc   . Fall resulting in striking against other object     paralyzed  . Multiple myeloma   . 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 Multiple myeloma 01/26/2011  . Unspecified vitamin D deficiency 03/06/2013   Past Surgical History:  Past Surgical History  Procedure Laterality Date  . Inguinal hernia repair    . Anterior cervical decomp/discectomy fusion    . Bone marrow aspirate and biopsy wiith lumbar puncture    . Melanoma excision      rt. breast  . Cervical spine surgery      Subjective Symptoms/Limitations Symptoms: Pt states that he was dx with multiple myeloma in 12/2004.  He went through a bone marrow transplant in 2007; he had a second transplant in July 2013.  He states that after his second transplant he noted increased swelling.  He states that he is on fluid pills but he does not like using them due to his fear or going into kidney failure.   He states that his right leg tends to swell greater than the left. Pertinent History: as above  How long can you sit comfortably?: no problem  How long can you stand comfortably?: Pt becomes tired after about 15 minutes How long can you walk comfortably?: Pt becomes tired after about 15 minutes. Pain Assessment Currently in Pain?: No/denies     Observation/Other Assessments Observations: Pt abdominal and LE appear swollen Other Assessments: abdominal measures 893.8 cm at umbilicus;  Pt volume of Rt LE is 4308.46; Lt is 4180.69    Exercise/Treatments Manual  Therapy Manual Therapy: Other (comment) Other Manual Therapy: Pt recieved manual decongestive therapy using supraclavicular, deep and superficial abdominal, and anterior Rt inguinal/axillary; Lt inguinal/axillary anastomosis.  Physical Therapy Assessment and Plan PT Assessment and Plan Clinical Impression Statement: Pt is a 68 yo males with multiple myeloma who states he has had increased swelling in his abdominal and LE since his last bone marrow transplant.  [name] was advised to obtain biker shorts and spandex top to add compression to his upper legs and abdominal area.  Pt will benefit from decongestive therapy to reduce swelling prior to obtaining compression wrapping PT Frequency: Min 3X/week PT Plan: manual techniques as well as wrapping Rt LE once bandages are obtained.  If pt has good results with Rt we will use bandaging on the Lt as well     Goals Home Exercise Program Pt/caregiver will Perform Home Exercise Program: For increased ROM PT Short Term Goals Time to Complete Short Term Goals: 2 weeks PT Short Term Goal 1: Pt to verbalize good skin care habits. PT Short Term Goal 2: Pt fluid to be decreased by 30% PT Short Term Goal 3: Pt to have obtained and to be wearing bikers shorts as well as a spandex top PT Long Term Goals Time to Complete Long Term Goals: 4 weeks PT Long Term Goal 1: fluid to be decreased by 50% PT Long Term Goal 2: Pt to  be able to verbalize the maintenance aspect of decongestive therapy and to be able to verbalize where to get measured for garment   Problem List Patient Active Problem List   Diagnosis Date Noted  . Lymphedema of lower extremity 08/08/2013  . Unspecified vitamin D deficiency 03/06/2013  . Intravenous pamidronate causing adverse effect in therapeutic use 10/14/2012  . Multiple myeloma 01/26/2011  . ESSENTIAL HYPERTENSION, BENIGN 03/05/2010  . BRADYCARDIA 03/05/2010    PT Plan of Care PT Home Exercise Plan: given  GP Functional  Assessment Tool Used: clinical judgement Functional Limitation: Other PT primary Other PT Primary Current Status (X4758): At least 40 percent but less than 60 percent impaired, limited or restricted Other PT Primary Goal Status (V0746): At least 20 percent but less than 40 percent impaired, limited or restricted  RUSSELL,CINDY 08/08/2013, 3:53 PM  Physician Documentation Your signature is required to indicate approval of the treatment plan as stated above.  Please sign and either send electronically or make a copy of this report for your files and return this physician signed original.   Please mark one 1.__approve of plan  2. ___approve of plan with the following conditions.   ______________________________                                                          _____________________ Physician Signature                                                                                                             Date

## 2013-08-10 ENCOUNTER — Ambulatory Visit (HOSPITAL_COMMUNITY)
Admission: RE | Admit: 2013-08-10 | Discharge: 2013-08-10 | Disposition: A | Discharge status: Home / Self Care | Payer: PRIVATE HEALTH INSURANCE | Source: Ambulatory Visit | Attending: Oncology | Admitting: Oncology

## 2013-08-10 NOTE — Progress Notes (Signed)
Physical Therapy Treatment Patient Details  Name: [name] MRN: [id number] Date of Birth: 09-Apr-1946  Today's Date: 08/10/2013 Time: 1435-1520 PT Time Calculation (min): 45 min  Visit#: 2 of 12  Re-eval: 09/07/13 Authorization: [employer] medicare  Authorization Visit#: 2 of 10  Charges:  Manual 45'  Subjective: Symptoms/Limitations Symptoms: Pt [facility] and [facility] did not have bandages in stock; states he went to [facility], they were going to order more and would cost him $90.  States he would rather try the hose first.  Reports he lost approx 1# after last session.   Objective: Manual Therapy Manual Therapy: Other (comment) Other Manual Therapy: Pt received manual decongestive therapy using supraclavicular, deep and superficial abdominal, and anterior/posterior inguinal/axillary anastomosis bilaterally  Physical Therapy Assessment and Plan PT Assessment and Plan Clinical Impression Statement: Pt comes with compression shorts on today with plans to get a compression top today.  Discussed mechanism of compression garments and bandages with patient.  Pt reports  he would like to try the garments first as he does not have swelling in the morning, only after his legs become in dependent position.  Pt  also with noted hemosiderin staining distal bilateral LE's with indication of venous insufficiency.  Pt given order for garments (20-30 mg) to have MD sign (discussed with evaluating therapist, Azucena Freed, PT and she agrees with plan).  Instructed to make appointment first thing in the morning.  Pt verbalized understanding.  Manual lymph drainage completed both anterior and posteriorly today.  PT Frequency: Min 3X/week PT Plan: Continue manual lymph drainage with use of torso, thigh and bilat LE compression garments.  If LE's not with good results will try bandaging to Rt LE.     Problem List Patient Active Problem List   Diagnosis Date Noted  . Lymphedema of lower  extremity 08/08/2013  . Unspecified vitamin D deficiency 03/06/2013  . Intravenous pamidronate causing adverse effect in therapeutic use 10/14/2012  . Multiple myeloma 01/26/2011  . ESSENTIAL HYPERTENSION, BENIGN 03/05/2010  . BRADYCARDIA 03/05/2010    PT Plan of Care PT Home Exercise Plan: given   Teena Irani, PTA/CLT 08/10/2013, 4:04 PM

## 2013-08-14 ENCOUNTER — Ambulatory Visit (HOSPITAL_COMMUNITY)
Admission: RE | Admit: 2013-08-14 | Discharge: 2013-08-14 | Disposition: A | Discharge status: Home / Self Care | Payer: PRIVATE HEALTH INSURANCE | Source: Ambulatory Visit | Attending: Family Medicine | Admitting: Family Medicine

## 2013-08-14 DIAGNOSIS — I890 Lymphedema, not elsewhere classified: Secondary | ICD-10-CM

## 2013-08-14 NOTE — Progress Notes (Signed)
Physical Therapy Treatment Patient Details  Name: [name] MRN: [id number] Date of Birth: 03-30-1946  Today's Date: 08/14/2013 Time: 1345-1430 PT Time Calculation (min): 45 min Charge:  Manual 1350-1430  Visit#: 3 of 12  Re-eval: 09/07/13    Authorization: [employer] medicare  Authorization Time Period:    Authorization Visit#: 3 of 10   Subjective: Symptoms/Limitations Symptoms: Pt states he recieved his compression stockings today.  Pt has compression shirt and shorts on at appointment time.   Precautions/Restrictions     Exercise/Treatments      Manual Therapy Other Manual Therapy: Pt recieved manual decongesitve therapy including supraclavicular, deep and superficial abdominal and anterior/postereiro inguinal/axillary anastomosisi and LE  B   Physical Therapy Assessment and Plan PT Assessment and Plan Clinical Impression Statement: Pt has decreased edema in LE but remains congested in his abdominal area.  Manual concentrated in abdominal area to decrese congestion,.   PT Plan: Pt to be remeasured next treatment to assess how fluid is moving.    Goals    Problem List Patient Active Problem List   Diagnosis Date Noted  . Lymphedema of lower extremity 08/08/2013  . Unspecified vitamin D deficiency 03/06/2013  . Intravenous pamidronate causing adverse effect in therapeutic use 10/14/2012  . Multiple myeloma 01/26/2011  . ESSENTIAL HYPERTENSION, BENIGN 03/05/2010  . BRADYCARDIA 03/05/2010       GP    RUSSELL,CINDY 08/14/2013, 4:33 PM

## 2013-08-16 ENCOUNTER — Ambulatory Visit (HOSPITAL_COMMUNITY)
Admission: RE | Admit: 2013-08-16 | Discharge: 2013-08-16 | Disposition: A | Discharge status: Home / Self Care | Payer: PRIVATE HEALTH INSURANCE | Source: Ambulatory Visit | Attending: Family Medicine | Admitting: Family Medicine

## 2013-08-16 DIAGNOSIS — I10 Essential (primary) hypertension: Secondary | ICD-10-CM | POA: Insufficient documentation

## 2013-08-16 DIAGNOSIS — Z9481 Bone marrow transplant status: Secondary | ICD-10-CM | POA: Insufficient documentation

## 2013-08-16 DIAGNOSIS — IMO0001 Reserved for inherently not codable concepts without codable children: Secondary | ICD-10-CM | POA: Insufficient documentation

## 2013-08-16 DIAGNOSIS — I890 Lymphedema, not elsewhere classified: Secondary | ICD-10-CM | POA: Insufficient documentation

## 2013-08-16 NOTE — Progress Notes (Signed)
Physical Therapy Treatment Patient Details  Name: [name] MRN: [id number] Date of Birth: 1945-11-20  Today's Date: 08/16/2013 Time: 1350-1445 PT Time Calculation (min): 55 min  Visit#: 4 of 12  Re-eval: 09/07/13 Authorization: [employer] medicare  Authorization Visit#: 4 of 10  Charges:  Manual 50  Subjective: Symptoms/Limitations Symptoms: Pt comes today frustrated with his progress.  States he weighed himself today and he still weighs 248#.  States his belly feels no different.  Objective: Manual Therapy Manual Therapy: Other (comment) Other Manual Therapy: Bilateral LE's and abdomen measured today followed by manual decongestive therapy including supraclavicular, deep and superficial abdominal and anterior/posterior inguinal/axillary anastomosis and bilateral LE's   Bilateral LE's/Abdomen measurements (cm converted to cc) Rt LE:  4294.74 cc (was 4308.46 cc on 08/08/13) -- 13.72 cc loss Lt LE:   4354.73 cc (was 4180.69 cc on 08/08/13) -- 174 cc gain Abdomen:  624 cm at umbilicus (was 469 cm on 08/08/13)  -- No change  Physical Therapy Assessment and Plan PT Assessment and Plan Clinical Impression Statement: Bilateral LE's and abdominal measured today.  Rt LE measured with 13.7 cc loss, Lt LE with 174 cc gain in fluid.  Abdominal circumference (at umbilicus) is remained unchanged at 124 cm.  Educated patient on importance of wearing LE garments and resuming his walking program.  No induration felt today in bilateral LE's, however abdominal region with tightness.  Pt reported his kidneys failed in 2006 and his urologist has expressed concern with possibility of future dialysis.  Unsure at this point how kidneys are functioning but has blood work tomorrow to check his levels.  Pt agreed to continue manual lymph drainage to assist with fluid removal.  Concentrated manual techniques to abdomen today.   PT Plan: Continue manual lymph drainage and education.  Measure weekly.     Problem  List Patient Active Problem List   Diagnosis Date Noted  . Lymphedema of lower extremity 08/08/2013  . Unspecified vitamin D deficiency 03/06/2013  . Intravenous pamidronate causing adverse effect in therapeutic use 10/14/2012  . Multiple myeloma 01/26/2011  . ESSENTIAL HYPERTENSION, BENIGN 03/05/2010  . BRADYCARDIA 03/05/2010      Teena Irani, PTA/CLT 08/16/2013, 5:28 PM

## 2013-08-17 ENCOUNTER — Encounter (HOSPITAL_COMMUNITY): Payer: PRIVATE HEALTH INSURANCE | Attending: Oncology

## 2013-08-17 ENCOUNTER — Encounter (HOSPITAL_BASED_OUTPATIENT_CLINIC_OR_DEPARTMENT_OTHER): Payer: PRIVATE HEALTH INSURANCE

## 2013-08-17 VITALS — BP 147/84 | HR 64 | Resp 16

## 2013-08-17 DIAGNOSIS — C9000 Multiple myeloma not having achieved remission: Secondary | ICD-10-CM

## 2013-08-17 DIAGNOSIS — D631 Anemia in chronic kidney disease: Secondary | ICD-10-CM

## 2013-08-17 DIAGNOSIS — T50995A Adverse effect of other drugs, medicaments and biological substances, initial encounter: Secondary | ICD-10-CM | POA: Insufficient documentation

## 2013-08-17 DIAGNOSIS — N184 Chronic kidney disease, stage 4 (severe): Secondary | ICD-10-CM

## 2013-08-17 DIAGNOSIS — N039 Chronic nephritic syndrome with unspecified morphologic changes: Secondary | ICD-10-CM

## 2013-08-17 LAB — CBC WITH DIFFERENTIAL/PLATELET
BASOS ABS: 0 10*3/uL (ref 0.0–0.1)
Basophils Relative: 0 % (ref 0–1)
EOS PCT: 3 % (ref 0–5)
Eosinophils Absolute: 0.1 10*3/uL (ref 0.0–0.7)
HCT: 30.2 % — ABNORMAL LOW (ref 39.0–52.0)
Hemoglobin: 9.8 g/dL — ABNORMAL LOW (ref 13.0–17.0)
LYMPHS ABS: 1.6 10*3/uL (ref 0.7–4.0)
Lymphocytes Relative: 54 % — ABNORMAL HIGH (ref 12–46)
MCH: 33.6 pg (ref 26.0–34.0)
MCHC: 32.5 g/dL (ref 30.0–36.0)
MCV: 103.4 fL — AB (ref 78.0–100.0)
MONO ABS: 0.4 10*3/uL (ref 0.1–1.0)
Monocytes Relative: 12 % (ref 3–12)
Neutro Abs: 0.9 10*3/uL — ABNORMAL LOW (ref 1.7–7.7)
Neutrophils Relative %: 30 % — ABNORMAL LOW (ref 43–77)
PLATELETS: 38 10*3/uL — AB (ref 150–400)
RBC: 2.92 MIL/uL — ABNORMAL LOW (ref 4.22–5.81)
RDW: 14.8 % (ref 11.5–15.5)
WBC: 2.9 10*3/uL — ABNORMAL LOW (ref 4.0–10.5)

## 2013-08-17 LAB — COMPREHENSIVE METABOLIC PANEL
ALT: 17 U/L (ref 0–53)
AST: 18 U/L (ref 0–37)
Albumin: 3.5 g/dL (ref 3.5–5.2)
Alkaline Phosphatase: 45 U/L (ref 39–117)
BUN: 55 mg/dL — ABNORMAL HIGH (ref 6–23)
CALCIUM: 8.4 mg/dL (ref 8.4–10.5)
CO2: 22 meq/L (ref 19–32)
CREATININE: 3.88 mg/dL — AB (ref 0.50–1.35)
Chloride: 107 mEq/L (ref 96–112)
GFR, EST AFRICAN AMERICAN: 17 mL/min — AB (ref >90)
GFR, EST NON AFRICAN AMERICAN: 15 mL/min — AB (ref >90)
GLUCOSE: 102 mg/dL — AB (ref 70–99)
Potassium: 4.9 mEq/L (ref 3.7–5.3)
Sodium: 141 mEq/L (ref 137–147)
Total Bilirubin: 0.5 mg/dL (ref 0.3–1.2)
Total Protein: 7 g/dL (ref 6.0–8.3)

## 2013-08-17 LAB — C-REACTIVE PROTEIN: CRP: 0.6 mg/dL — ABNORMAL HIGH (ref <0.60)

## 2013-08-17 LAB — SEDIMENTATION RATE: Sed Rate: 15 mm/hr (ref 0–16)

## 2013-08-17 MED ORDER — DARBEPOETIN ALFA-POLYSORBATE 500 MCG/ML IJ SOLN
500.0000 ug | Freq: Once | INTRAMUSCULAR | Status: AC
  Administered 2013-08-17: 500 ug via SUBCUTANEOUS

## 2013-08-17 MED ORDER — DARBEPOETIN ALFA-POLYSORBATE 500 MCG/ML IJ SOLN
INTRAMUSCULAR | Status: AC
  Filled 2013-08-17: qty 1

## 2013-08-17 NOTE — Progress Notes (Signed)
[name] presents today for injection per MD orders. Aranesp 500 mcg administered SQ in right Abdomen. Administration without incident. Patient tolerated well.  

## 2013-08-18 ENCOUNTER — Inpatient Hospital Stay (HOSPITAL_COMMUNITY)
Admission: RE | Admit: 2013-08-18 | Payer: PRIVATE HEALTH INSURANCE | Source: Ambulatory Visit | Admitting: Physical Therapy

## 2013-08-18 LAB — KAPPA/LAMBDA LIGHT CHAINS
KAPPA FREE LGHT CHN: 9.4 mg/dL — AB (ref 0.33–1.94)
Kappa, lambda light chain ratio: 1.55 (ref 0.26–1.65)
Lambda free light chains: 6.08 mg/dL — ABNORMAL HIGH (ref 0.57–2.63)

## 2013-08-18 NOTE — Progress Notes (Signed)
Labs drawn via PIV stick per Pam, Phlebotomist.   

## 2013-08-21 ENCOUNTER — Ambulatory Visit (HOSPITAL_COMMUNITY): Payer: PRIVATE HEALTH INSURANCE | Admitting: Physical Therapy

## 2013-08-21 ENCOUNTER — Telehealth (HOSPITAL_COMMUNITY): Payer: Self-pay | Admitting: Oncology

## 2013-08-21 ENCOUNTER — Encounter (HOSPITAL_COMMUNITY): Payer: PRIVATE HEALTH INSURANCE

## 2013-08-21 LAB — MULTIPLE MYELOMA PANEL, SERUM
Albumin ELP: 61.9 % (ref 55.8–66.1)
Alpha-1-Globulin: 5.3 % — ABNORMAL HIGH (ref 2.9–4.9)
Alpha-2-Globulin: 9.6 % (ref 7.1–11.8)
Beta 2: 2.9 % — ABNORMAL LOW (ref 3.2–6.5)
Beta Globulin: 5.9 % (ref 4.7–7.2)
GAMMA GLOBULIN: 14.4 % (ref 11.1–18.8)
IGA: 79 mg/dL (ref 68–379)
IgG (Immunoglobin G), Serum: 937 mg/dL (ref 650–1600)
IgM, Serum: 106 mg/dL (ref 41–251)
M-SPIKE, %: NOT DETECTED g/dL
Total Protein: 6.1 g/dL (ref 6.0–8.3)

## 2013-08-21 LAB — BETA 2 MICROGLOBULIN, SERUM: BETA 2 MICROGLOBULIN: 17.8 mg/L — AB (ref <=2.51)

## 2013-08-21 NOTE — Telephone Encounter (Signed)
Patient wanted to review lab work.  I personally reviewed and went over laboratory results with the patient.  The results are noted within this dictation.  He notes his Hgb is 9.8 g/dL.  Over the weekend, he contact ed the specialty pharmacy to ask about Pomalyst causing anemia.  He was informed that it can.  As a result, he wanted to stop the medication.  I advised him not to stop his Pomalyst.   He has many causes of his anemia including his renal disease that is followed by nephrology, Dr. Hinda Lenis.    I spent 15 minutes on the phone with him.  [name],Gregory

## 2013-08-22 ENCOUNTER — Encounter (HOSPITAL_COMMUNITY): Payer: PRIVATE HEALTH INSURANCE

## 2013-08-23 ENCOUNTER — Ambulatory Visit (HOSPITAL_COMMUNITY): Payer: PRIVATE HEALTH INSURANCE | Admitting: *Deleted

## 2013-08-28 ENCOUNTER — Other Ambulatory Visit (HOSPITAL_COMMUNITY): Payer: Self-pay | Admitting: Oncology

## 2013-08-28 ENCOUNTER — Ambulatory Visit (HOSPITAL_COMMUNITY): Payer: PRIVATE HEALTH INSURANCE | Admitting: Physical Therapy

## 2013-08-28 ENCOUNTER — Telehealth (HOSPITAL_COMMUNITY): Payer: Self-pay | Admitting: Oncology

## 2013-08-28 DIAGNOSIS — C9000 Multiple myeloma not having achieved remission: Secondary | ICD-10-CM

## 2013-08-28 MED ORDER — POMALIDOMIDE 1 MG PO CAPS: ORAL_CAPSULE | ORAL | Status: DC

## 2013-08-28 NOTE — Telephone Encounter (Signed)
[place] has seen Dr. Theador Hawthorne (nephrology) who has made a number of recommendations regarding [name]'s renal function including:  1. Lasix daily x 7 days and then every other day  2. Less eating out and cooking at home to control Na intake.  Edy reached out to his pharmacist who informed him that amlodipine can cause "fluid build-up."  As a result, Rush has reached out to his cardiologist.  From a hematology standpoint, his anemia is multifactorial and is from past bone marrow transplants, multiple myeloma treatment, and poor renal function.  Unfortunately, in my opinion, Damarien needs to maintain his Pomalyst and we will support his Hgb with ESA therapy.  It may be worth while to increase the frequency of Aranesp for some time to boost his Hgb.  Baird Cancer 08/28/2013

## 2013-08-30 ENCOUNTER — Other Ambulatory Visit (HOSPITAL_COMMUNITY): Payer: Self-pay | Admitting: Oncology

## 2013-08-30 DIAGNOSIS — C9000 Multiple myeloma not having achieved remission: Secondary | ICD-10-CM

## 2013-08-30 MED ORDER — POMALIDOMIDE 1 MG PO CAPS: ORAL_CAPSULE | ORAL | Status: DC

## 2013-09-08 ENCOUNTER — Encounter (HOSPITAL_BASED_OUTPATIENT_CLINIC_OR_DEPARTMENT_OTHER): Payer: PRIVATE HEALTH INSURANCE

## 2013-09-08 VITALS — BP 129/80 | HR 54 | Resp 18

## 2013-09-08 DIAGNOSIS — C9000 Multiple myeloma not having achieved remission: Secondary | ICD-10-CM

## 2013-09-08 DIAGNOSIS — N039 Chronic nephritic syndrome with unspecified morphologic changes: Secondary | ICD-10-CM

## 2013-09-08 DIAGNOSIS — D631 Anemia in chronic kidney disease: Secondary | ICD-10-CM

## 2013-09-08 DIAGNOSIS — N189 Chronic kidney disease, unspecified: Secondary | ICD-10-CM

## 2013-09-08 LAB — CBC
HCT: 34.5 % — ABNORMAL LOW (ref 39.0–52.0)
Hemoglobin: 11.1 g/dL — ABNORMAL LOW (ref 13.0–17.0)
MCH: 32.7 pg (ref 26.0–34.0)
MCHC: 32.2 g/dL (ref 30.0–36.0)
MCV: 101.8 fL — ABNORMAL HIGH (ref 78.0–100.0)
PLATELETS: 52 10*3/uL — AB (ref 150–400)
RBC: 3.39 MIL/uL — ABNORMAL LOW (ref 4.22–5.81)
RDW: 14.3 % (ref 11.5–15.5)
WBC: 4.1 10*3/uL (ref 4.0–10.5)

## 2013-09-08 MED ORDER — DARBEPOETIN ALFA-POLYSORBATE 500 MCG/ML IJ SOLN
INTRAMUSCULAR | Status: AC
  Filled 2013-09-08: qty 1

## 2013-09-08 MED ORDER — DARBEPOETIN ALFA-POLYSORBATE 500 MCG/ML IJ SOLN
500.0000 ug | Freq: Once | INTRAMUSCULAR | Status: AC
  Administered 2013-09-08: 500 ug via SUBCUTANEOUS

## 2013-09-08 NOTE — Progress Notes (Signed)
[name] presents today for injection per MD orders. Aranesp 500 mcg administered SQ in left Abdomen. Administration without incident. Patient tolerated well.  

## 2013-09-08 NOTE — Progress Notes (Signed)
Labs drawn today for cbc 

## 2013-09-15 ENCOUNTER — Other Ambulatory Visit (HOSPITAL_COMMUNITY): Payer: Self-pay | Admitting: Oncology

## 2013-09-15 DIAGNOSIS — C9000 Multiple myeloma not having achieved remission: Secondary | ICD-10-CM

## 2013-09-15 MED ORDER — POMALIDOMIDE 1 MG PO CAPS: ORAL_CAPSULE | ORAL | Status: DC

## 2013-09-26 NOTE — Progress Notes (Signed)
Gregory M, MD [address] [place] [address]  Multiple myeloma - Plan: NM Bone Scan Whole Body  CURRENT THERAPY: Pomalyst every other day x 14 days followed by 7 day respite, then repeated. Dexamethasone D/C'd due to patient reported intolerance. Anaphylactic reaction to bisphosphonate therapy, D/C'd  INTERVAL HISTORY: [name] 68 y.o. 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 Marcell Anger at [facility].  I personally reviewed and went over laboratory results with the patient.  The results are noted within this dictation. His renal function is acutely worse, but he has been diagnosed with a URI and treated with an injection (I suspect Rocephin but I do not have those records).  I have asked him to follow-up with nephrology for this.  He reports that he is still producing urine without any complications with urine formation.  I personally reviewed and went over radiographic studies with the patient.  The results are noted within this dictation.  Bone survey is negative for any metastatic bone mets.  He notes that he was diagnosed by his primary care provider with a upper respiratory tract infection.  He was treated with an "injection" but he does not know exactly what it was.  I question whether it was rocephin.  He was also prescribed a Z-Pak and told to start that this weekend if not better.  However, he notes that he is feeling better today compared to yesterday.  Hematologically, he denies any complaints and ROS questioning is negative.    Past Medical History  Diagnosis Date  . Hypertension   . Ruptured cervical disc   . Fall resulting in striking against other object     paralyzed  . Multiple myeloma   . 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 Multiple myeloma 01/26/2011  . Unspecified  vitamin D deficiency 03/06/2013    has ESSENTIAL HYPERTENSION, BENIGN; BRADYCARDIA; Multiple myeloma; Intravenous pamidronate causing adverse effect in therapeutic use; Unspecified vitamin D deficiency; and Lymphedema of lower extremity on his problem list.     is allergic to pamidronate.  [name] had no medications administered during this visit.  Past Surgical History  Procedure Laterality Date  . Inguinal hernia repair    . Anterior cervical decomp/discectomy fusion    . Bone marrow aspirate and biopsy wiith lumbar puncture    . Melanoma excision      rt. breast  . Cervical spine surgery      Denies any headaches, dizziness, double vision, fevers, chills, night sweats, nausea, vomiting, diarrhea, constipation, chest pain, heart palpitations, shortness of breath, blood in stool, black tarry stool, urinary pain, urinary burning, urinary frequency, hematuria.   PHYSICAL EXAMINATION  ECOG PERFORMANCE STATUS: 1 - Symptomatic but completely ambulatory  Filed Vitals:   09/29/13 1040  BP: 118/78  Pulse: 65  Temp: 98.4 F (36.9 C)  Resp: 18    GENERAL:alert, no distress, well nourished, well developed, comfortable, cooperative, obese and smiling SKIN: skin color, texture, turgor are normal, no rashes or significant lesions HEAD: Normocephalic, No masses, lesions, tenderness or abnormalities EYES: normal, PERRLA, EOMI, Conjunctiva are pink and non-injected EARS: External ears normal OROPHARYNX:mucous membranes are moist  NECK: supple, trachea midline LYMPH:  not examined BREAST:not examined LUNGS: not examined HEART: not examined ABDOMEN:obese BACK: Back symmetric, no curvature. EXTREMITIES:no skin discoloration, no  clubbing, no cyanosis  NEURO: alert & oriented x 3 with fluent speech, no focal motor/sensory deficits, gait normal   LABORATORY DATA: CBC    Component Value Date/Time   WBC 4.0 09/29/2013 0853   RBC 3.56* 09/29/2013 0853   HGB 11.7* 09/29/2013 0853    HCT 35.5* 09/29/2013 0853   PLT 53* 09/29/2013 0853   MCV 99.7 09/29/2013 0853   MCH 32.9 09/29/2013 0853   MCHC 33.0 09/29/2013 0853   RDW 15.4 09/29/2013 0853   LYMPHSABS 2.1 09/29/2013 0853   MONOABS 0.4 09/29/2013 0853   EOSABS 0.1 09/29/2013 0853   BASOSABS 0.0 09/29/2013 0853      Chemistry      Component Value Date/Time   NA 139 09/29/2013 0853   K 4.7 09/29/2013 0853   CL 101 09/29/2013 0853   CO2 19 09/29/2013 0853   BUN 89* 09/29/2013 0853   CREATININE 5.09* 09/29/2013 0853   CREATININE 43.09* 01/26/2012 1052      Component Value Date/Time   CALCIUM 8.9 09/29/2013 0853   ALKPHOS 44 09/29/2013 0853   AST 25 09/29/2013 0853   ALT 18 09/29/2013 0853   BILITOT 0.5 09/29/2013 0853       RADIOGRAPHIC STUDIES:  07/28/2013  CLINICAL DATA: Multiple myeloma.  EXAM:  METASTATIC BONE SURVEY  COMPARISON: 07/29/2012 and 01/13/2005  FINDINGS:  There are no new lesions. Small lucencies in the proximal humeri are  stable. Post surgical changes in the cervical spine and diffuse  degenerative changes in the thoracic and lumbar spine are unchanged.  IMPRESSION:  Stable appearance of the skeleton since the prior study. No new  lesions. Small lucencies in the proximal humeri are unchanged. I  suspect these may represent benign vascular channels.  Electronically Signed  By: Rozetta Nunnery [name].D.  On: 07/28/2013 11:28     ASSESSMENT:  1. 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 Marcell Anger at [place] restarted on September 05, 2012, 7 days on and 7 days off. Dexamethasone D/C'd due to patient reported intolerance.  2. Anaphylactic reaction to bisphosphonate therapy, D/C'd  3. Chronic Kidney disease, grade 4, followed by nephrology  4. Anemia of chronic disease secondary to #1 and #3. On Aranesp every 3 weeks 5. Acute renal worsening 6. URI, treated by primary care provider  Patient Active Problem List   Diagnosis  Date Noted  . Lymphedema of lower extremity 08/08/2013  . Unspecified vitamin D deficiency 03/06/2013  . Intravenous pamidronate causing adverse effect in therapeutic use 10/14/2012  . Multiple myeloma 01/26/2011  . ESSENTIAL HYPERTENSION, BENIGN 03/05/2010  . BRADYCARDIA 03/05/2010     PLAN:  1. I personally reviewed and went over laboratory results with the patient. The results are noted within this dictation.  2. Labs today: CBC diff, CMET, MM panel, CRP, B2M, Vit D level  3. Aranesp deferred today for a Hgb of 11.7 g/dL 4. Supportive therapy plan reviewed.  5. Labs every 3 weeks: CBC  6. Labs every 6 weeks: CBC diff, CMET, CRP, MM panel, B2M  7. Recommend follow-up with nephrologist regarding renal function. 8. Continue Pomalyst every other day x 14 days with 1 week break. 9. If not feeling better over the weekend, I recommend he start the prescribed Z-Pak by primary care provider.  10. Return in 3 months for follow-up   THERAPY PLAN:  He will continue with Pomalyst as prescribed and outlined above. We will monitor his labs and  support his counts. We will monitor multiple myeloma markers and be on the look out for relapse.   All questions were answered. The patient knows to call the clinic with any problems, questions or concerns. We can certainly see the patient much sooner if necessary.  Patient and plan discussed with Dr. Farrel Gobble and he is in agreement with the aforementioned.   Baird Cancer 09/29/2013

## 2013-09-28 ENCOUNTER — Other Ambulatory Visit (HOSPITAL_COMMUNITY): Payer: Self-pay

## 2013-09-29 ENCOUNTER — Encounter (HOSPITAL_COMMUNITY): Payer: PRIVATE HEALTH INSURANCE

## 2013-09-29 ENCOUNTER — Encounter (HOSPITAL_COMMUNITY): Payer: PRIVATE HEALTH INSURANCE | Attending: Oncology | Admitting: Oncology

## 2013-09-29 VITALS — BP 118/78 | HR 65 | Temp 98.4°F | Resp 18 | Wt 237.0 lb

## 2013-09-29 DIAGNOSIS — C9000 Multiple myeloma not having achieved remission: Secondary | ICD-10-CM | POA: Insufficient documentation

## 2013-09-29 DIAGNOSIS — J069 Acute upper respiratory infection, unspecified: Secondary | ICD-10-CM

## 2013-09-29 DIAGNOSIS — T50995A Adverse effect of other drugs, medicaments and biological substances, initial encounter: Secondary | ICD-10-CM | POA: Insufficient documentation

## 2013-09-29 DIAGNOSIS — N184 Chronic kidney disease, stage 4 (severe): Secondary | ICD-10-CM

## 2013-09-29 DIAGNOSIS — N259 Disorder resulting from impaired renal tubular function, unspecified: Secondary | ICD-10-CM | POA: Insufficient documentation

## 2013-09-29 DIAGNOSIS — D631 Anemia in chronic kidney disease: Secondary | ICD-10-CM

## 2013-09-29 DIAGNOSIS — C9002 Multiple myeloma in relapse: Secondary | ICD-10-CM

## 2013-09-29 DIAGNOSIS — N039 Chronic nephritic syndrome with unspecified morphologic changes: Secondary | ICD-10-CM

## 2013-09-29 LAB — COMPREHENSIVE METABOLIC PANEL
ALBUMIN: 3.6 g/dL (ref 3.5–5.2)
ALK PHOS: 44 U/L (ref 39–117)
ALT: 18 U/L (ref 0–53)
AST: 25 U/L (ref 0–37)
BUN: 89 mg/dL — ABNORMAL HIGH (ref 6–23)
CO2: 19 mEq/L (ref 19–32)
Calcium: 8.9 mg/dL (ref 8.4–10.5)
Chloride: 101 mEq/L (ref 96–112)
Creatinine, Ser: 5.09 mg/dL — ABNORMAL HIGH (ref 0.50–1.35)
GFR calc Af Amer: 12 mL/min — ABNORMAL LOW (ref >90)
GFR calc non Af Amer: 11 mL/min — ABNORMAL LOW (ref >90)
GLUCOSE: 100 mg/dL — AB (ref 70–99)
POTASSIUM: 4.7 meq/L (ref 3.7–5.3)
SODIUM: 139 meq/L (ref 137–147)
TOTAL PROTEIN: 7.3 g/dL (ref 6.0–8.3)
Total Bilirubin: 0.5 mg/dL (ref 0.3–1.2)

## 2013-09-29 LAB — CBC WITH DIFFERENTIAL/PLATELET
BASOS PCT: 0 % (ref 0–1)
Basophils Absolute: 0 10*3/uL (ref 0.0–0.1)
Eosinophils Absolute: 0.1 10*3/uL (ref 0.0–0.7)
Eosinophils Relative: 2 % (ref 0–5)
HCT: 35.5 % — ABNORMAL LOW (ref 39.0–52.0)
Hemoglobin: 11.7 g/dL — ABNORMAL LOW (ref 13.0–17.0)
LYMPHS ABS: 2.1 10*3/uL (ref 0.7–4.0)
Lymphocytes Relative: 51 % — ABNORMAL HIGH (ref 12–46)
MCH: 32.9 pg (ref 26.0–34.0)
MCHC: 33 g/dL (ref 30.0–36.0)
MCV: 99.7 fL (ref 78.0–100.0)
Monocytes Absolute: 0.4 10*3/uL (ref 0.1–1.0)
Monocytes Relative: 10 % (ref 3–12)
NEUTROS PCT: 37 % — AB (ref 43–77)
Neutro Abs: 1.5 10*3/uL — ABNORMAL LOW (ref 1.7–7.7)
PLATELETS: 53 10*3/uL — AB (ref 150–400)
RBC: 3.56 MIL/uL — AB (ref 4.22–5.81)
RDW: 15.4 % (ref 11.5–15.5)
WBC: 4 10*3/uL (ref 4.0–10.5)

## 2013-09-29 LAB — C-REACTIVE PROTEIN: CRP: 3 mg/dL — ABNORMAL HIGH (ref <0.60)

## 2013-09-29 LAB — SEDIMENTATION RATE: Sed Rate: 25 mm/hr — ABNORMAL HIGH (ref 0–16)

## 2013-09-29 NOTE — Patient Instructions (Signed)
[facility] Discharge Instructions  RECOMMENDATIONS MADE BY THE CONSULTANT AND ANY TEST RESULTS WILL BE SENT TO YOUR REFERRING PHYSICIAN.  MEDICATIONS PRESCRIBED:  None  INSTRUCTIONS GIVEN AND DISCUSSED: Recommend follow-up with Nephrologist regarding renal function.  Acute exacerbation of BUN + Creatinine may be secondary to injection given to you by primary care provider.  SPECIAL INSTRUCTIONS/FOLLOW-UP: Labs every 3 weeks.   Return in 3 months for follow-up Please call the clinic with any questions, concerns, or issues related to your multiple myeloma care.   Thank you for choosing [facility] to provide your oncology and hematology care.  To afford each patient quality time with our providers, please arrive at least 15 minutes before your scheduled appointment time.  With your help, our goal is to use those 15 minutes to complete the necessary work-up to ensure our physicians have the information they need to help with your evaluation and healthcare recommendations.    Effective January 1st, 2014, we ask that you re-schedule your appointment with our physicians should you arrive 10 or more minutes late for your appointment.  We strive to give you quality time with our providers, and arriving late affects you and other patients whose appointments are after yours.    Again, thank you for choosing [facility].  Our hope is that these requests will decrease the amount of time that you wait before being seen by our physicians.       _____________________________________________________________  Should you have questions after your visit to [facility], please contact our office at [phone] between the hours of 8:30 a.m. and 5:00 p.m.  Voicemails left after 4:30 p.m. will not be returned until the following business day.  For prescription refill requests, have your pharmacy contact our office with your prescription refill  request.

## 2013-10-02 ENCOUNTER — Telehealth (HOSPITAL_COMMUNITY): Payer: Self-pay | Admitting: Oncology

## 2013-10-02 ENCOUNTER — Other Ambulatory Visit (HOSPITAL_COMMUNITY): Payer: Self-pay | Admitting: Oncology

## 2013-10-02 DIAGNOSIS — N289 Disorder of kidney and ureter, unspecified: Secondary | ICD-10-CM

## 2013-10-02 LAB — KAPPA/LAMBDA LIGHT CHAINS
Kappa free light chain: 5.9 mg/dL — ABNORMAL HIGH (ref 0.33–1.94)
Kappa, lambda light chain ratio: 1.41 (ref 0.26–1.65)
Lambda free light chains: 4.18 mg/dL — ABNORMAL HIGH (ref 0.57–2.63)

## 2013-10-02 LAB — BETA 2 MICROGLOBULIN, SERUM: Beta-2 Microglobulin: 30.3 mg/L — ABNORMAL HIGH (ref <=2.51)

## 2013-10-02 NOTE — Telephone Encounter (Signed)
Returning telephone call.  His kidney physician would like lab work performed on Thursday.  He would like to have it performed at the [facility].  I told him to request an order and have it faxed to [facility] or call us and I would be glad to arrange for a lab appointment.   Baird Cancer 10/02/2013

## 2013-10-03 LAB — MULTIPLE MYELOMA PANEL, SERUM
ALPHA-1-GLOBULIN: 5.2 % — AB (ref 2.9–4.9)
ALPHA-2-GLOBULIN: 9.8 % (ref 7.1–11.8)
Albumin ELP: 62.4 % (ref 55.8–66.1)
Beta 2: 3.3 % (ref 3.2–6.5)
Beta Globulin: 5.3 % (ref 4.7–7.2)
Gamma Globulin: 14 % (ref 11.1–18.8)
IGA: 96 mg/dL (ref 68–379)
IGM, SERUM: 92 mg/dL (ref 41–251)
IgG (Immunoglobin G), Serum: 1030 mg/dL (ref 650–1600)
M-Spike, %: NOT DETECTED g/dL
TOTAL PROTEIN: 7 g/dL (ref 6.0–8.3)

## 2013-10-05 ENCOUNTER — Encounter (HOSPITAL_BASED_OUTPATIENT_CLINIC_OR_DEPARTMENT_OTHER): Payer: PRIVATE HEALTH INSURANCE

## 2013-10-05 DIAGNOSIS — N259 Disorder resulting from impaired renal tubular function, unspecified: Secondary | ICD-10-CM

## 2013-10-05 DIAGNOSIS — N289 Disorder of kidney and ureter, unspecified: Secondary | ICD-10-CM

## 2013-10-05 LAB — COMPREHENSIVE METABOLIC PANEL
ALT: 30 U/L (ref 0–53)
AST: 29 U/L (ref 0–37)
Albumin: 3.5 g/dL (ref 3.5–5.2)
Alkaline Phosphatase: 44 U/L (ref 39–117)
BILIRUBIN TOTAL: 0.4 mg/dL (ref 0.3–1.2)
BUN: 85 mg/dL — ABNORMAL HIGH (ref 6–23)
CALCIUM: 8.4 mg/dL (ref 8.4–10.5)
CHLORIDE: 103 meq/L (ref 96–112)
CO2: 22 mEq/L (ref 19–32)
CREATININE: 4.3 mg/dL — AB (ref 0.50–1.35)
GFR calc non Af Amer: 13 mL/min — ABNORMAL LOW (ref >90)
GFR, EST AFRICAN AMERICAN: 15 mL/min — AB (ref >90)
GLUCOSE: 171 mg/dL — AB (ref 70–99)
Potassium: 5 mEq/L (ref 3.7–5.3)
Sodium: 140 mEq/L (ref 137–147)
Total Protein: 7 g/dL (ref 6.0–8.3)

## 2013-10-12 ENCOUNTER — Other Ambulatory Visit (HOSPITAL_COMMUNITY): Payer: Self-pay | Admitting: Hematology and Oncology

## 2013-10-12 ENCOUNTER — Telehealth (HOSPITAL_COMMUNITY): Payer: Self-pay | Admitting: Oncology

## 2013-10-12 DIAGNOSIS — C9000 Multiple myeloma not having achieved remission: Secondary | ICD-10-CM

## 2013-10-12 MED ORDER — POMALIDOMIDE 1 MG PO CAPS
ORAL_CAPSULE | ORAL | Status: DC
Start: 2013-10-12 — End: 2013-10-26

## 2013-10-12 NOTE — Telephone Encounter (Signed)
Pharmacy contacted and requested that only 7 Pomalyst tablets to be dispensed.  Per pharmacy a new prescription with that change will have to be faxed.  Can not be taken as a telephone order.  New prescription will need to be faxed to [phone].

## 2013-10-12 NOTE — Telephone Encounter (Signed)
Message copied by Mellissa Kohut on Thu Oct 12, 2013 11:07 AM ------      Message from: Farrel Gobble A      Created: Thu Oct 12, 2013 10:43 AM       [facility] and have [facility] dispense only 7 Pomalyst tablets.  Thanks. Dr.F ------

## 2013-10-12 NOTE — Telephone Encounter (Signed)
Echo called in reference to [place] prescription - [name]'s usual quantity is for 7 tablets, and he was written for 10 tablets.  [name] does not want the 3 extra tablets.  If it were anyone else, I'd argue to just hold over the tablets until next time, but he is truly too complicated to manage and understand that on his own after speaking with him.

## 2013-10-13 NOTE — Progress Notes (Signed)
Labs drawn

## 2013-10-20 ENCOUNTER — Encounter (HOSPITAL_COMMUNITY): Payer: PRIVATE HEALTH INSURANCE | Attending: Oncology

## 2013-10-20 ENCOUNTER — Encounter (HOSPITAL_BASED_OUTPATIENT_CLINIC_OR_DEPARTMENT_OTHER): Payer: PRIVATE HEALTH INSURANCE

## 2013-10-20 VITALS — BP 135/87 | HR 59 | Temp 98.8°F | Resp 18

## 2013-10-20 DIAGNOSIS — D631 Anemia in chronic kidney disease: Secondary | ICD-10-CM

## 2013-10-20 DIAGNOSIS — C9000 Multiple myeloma not having achieved remission: Secondary | ICD-10-CM | POA: Insufficient documentation

## 2013-10-20 DIAGNOSIS — N184 Chronic kidney disease, stage 4 (severe): Secondary | ICD-10-CM

## 2013-10-20 DIAGNOSIS — T50995A Adverse effect of other drugs, medicaments and biological substances, initial encounter: Secondary | ICD-10-CM | POA: Insufficient documentation

## 2013-10-20 DIAGNOSIS — N039 Chronic nephritic syndrome with unspecified morphologic changes: Secondary | ICD-10-CM

## 2013-10-20 DIAGNOSIS — C9002 Multiple myeloma in relapse: Secondary | ICD-10-CM

## 2013-10-20 LAB — CBC
HCT: 31.9 % — ABNORMAL LOW (ref 39.0–52.0)
HEMOGLOBIN: 10.7 g/dL — AB (ref 13.0–17.0)
MCH: 32.9 pg (ref 26.0–34.0)
MCHC: 33.5 g/dL (ref 30.0–36.0)
MCV: 98.2 fL (ref 78.0–100.0)
Platelets: 45 10*3/uL — ABNORMAL LOW (ref 150–400)
RBC: 3.25 MIL/uL — ABNORMAL LOW (ref 4.22–5.81)
RDW: 15 % (ref 11.5–15.5)
WBC: 4.5 10*3/uL (ref 4.0–10.5)

## 2013-10-20 MED ORDER — DARBEPOETIN ALFA-POLYSORBATE 500 MCG/ML IJ SOLN
INTRAMUSCULAR | Status: AC
  Filled 2013-10-20: qty 1

## 2013-10-20 MED ORDER — DARBEPOETIN ALFA-POLYSORBATE 500 MCG/ML IJ SOLN
500.0000 ug | Freq: Once | INTRAMUSCULAR | Status: AC
  Administered 2013-10-20: 500 ug via SUBCUTANEOUS

## 2013-10-20 NOTE — Progress Notes (Signed)
LABS DRAWN FOR CBC 

## 2013-10-20 NOTE — Progress Notes (Signed)
[name] presents today for injection per MD orders. Aranesp 500 mcg administered SQ in left Abdomen. Administration without incident. Patient tolerated well.

## 2013-10-24 ENCOUNTER — Other Ambulatory Visit (HOSPITAL_COMMUNITY): Payer: Self-pay | Admitting: Oncology

## 2013-10-26 ENCOUNTER — Other Ambulatory Visit (HOSPITAL_COMMUNITY): Payer: Self-pay | Admitting: Oncology

## 2013-10-26 DIAGNOSIS — C9000 Multiple myeloma not having achieved remission: Secondary | ICD-10-CM

## 2013-10-26 MED ORDER — POMALIDOMIDE 1 MG PO CAPS: ORAL_CAPSULE | ORAL | Status: DC

## 2013-11-10 ENCOUNTER — Encounter (HOSPITAL_BASED_OUTPATIENT_CLINIC_OR_DEPARTMENT_OTHER): Payer: PRIVATE HEALTH INSURANCE

## 2013-11-10 VITALS — BP 155/79 | HR 64 | Resp 18

## 2013-11-10 DIAGNOSIS — N184 Chronic kidney disease, stage 4 (severe): Secondary | ICD-10-CM

## 2013-11-10 DIAGNOSIS — D631 Anemia in chronic kidney disease: Secondary | ICD-10-CM

## 2013-11-10 DIAGNOSIS — N039 Chronic nephritic syndrome with unspecified morphologic changes: Secondary | ICD-10-CM

## 2013-11-10 DIAGNOSIS — C9000 Multiple myeloma not having achieved remission: Secondary | ICD-10-CM

## 2013-11-10 LAB — COMPREHENSIVE METABOLIC PANEL
ALBUMIN: 3.7 g/dL (ref 3.5–5.2)
ALT: 19 U/L (ref 0–53)
AST: 20 U/L (ref 0–37)
Alkaline Phosphatase: 42 U/L (ref 39–117)
BUN: 72 mg/dL — AB (ref 6–23)
CALCIUM: 8.5 mg/dL (ref 8.4–10.5)
CO2: 24 mEq/L (ref 19–32)
CREATININE: 4.16 mg/dL — AB (ref 0.50–1.35)
Chloride: 101 mEq/L (ref 96–112)
GFR calc Af Amer: 16 mL/min — ABNORMAL LOW (ref >90)
GFR calc non Af Amer: 14 mL/min — ABNORMAL LOW (ref >90)
Glucose, Bld: 101 mg/dL — ABNORMAL HIGH (ref 70–99)
Potassium: 4.7 mEq/L (ref 3.7–5.3)
Sodium: 139 mEq/L (ref 137–147)
TOTAL PROTEIN: 6.8 g/dL (ref 6.0–8.3)
Total Bilirubin: 0.5 mg/dL (ref 0.3–1.2)

## 2013-11-10 LAB — CBC WITH DIFFERENTIAL/PLATELET
BASOS ABS: 0 10*3/uL (ref 0.0–0.1)
Basophils Relative: 0 % (ref 0–1)
EOS PCT: 2 % (ref 0–5)
Eosinophils Absolute: 0.1 10*3/uL (ref 0.0–0.7)
HCT: 31.9 % — ABNORMAL LOW (ref 39.0–52.0)
Hemoglobin: 10.6 g/dL — ABNORMAL LOW (ref 13.0–17.0)
LYMPHS ABS: 1.8 10*3/uL (ref 0.7–4.0)
LYMPHS PCT: 49 % — AB (ref 12–46)
MCH: 34 pg (ref 26.0–34.0)
MCHC: 33.2 g/dL (ref 30.0–36.0)
MCV: 102.2 fL — AB (ref 78.0–100.0)
Monocytes Absolute: 0.4 10*3/uL (ref 0.1–1.0)
Monocytes Relative: 11 % (ref 3–12)
NEUTROS ABS: 1.4 10*3/uL — AB (ref 1.7–7.7)
NEUTROS PCT: 38 % — AB (ref 43–77)
Platelets: 44 10*3/uL — ABNORMAL LOW (ref 150–400)
RBC: 3.12 MIL/uL — AB (ref 4.22–5.81)
RDW: 16.7 % — AB (ref 11.5–15.5)
Smear Review: DECREASED
WBC: 3.6 10*3/uL — AB (ref 4.0–10.5)

## 2013-11-10 LAB — C-REACTIVE PROTEIN

## 2013-11-10 LAB — SEDIMENTATION RATE: SED RATE: 15 mm/h (ref 0–16)

## 2013-11-10 MED ORDER — DARBEPOETIN ALFA-POLYSORBATE 500 MCG/ML IJ SOLN
INTRAMUSCULAR | Status: AC
  Filled 2013-11-10: qty 1

## 2013-11-10 MED ORDER — DARBEPOETIN ALFA-POLYSORBATE 500 MCG/ML IJ SOLN
500.0000 ug | Freq: Once | INTRAMUSCULAR | Status: AC
  Administered 2013-11-10: 500 ug via SUBCUTANEOUS

## 2013-11-10 NOTE — Progress Notes (Signed)
LABS DRAWN FOR MM, KLLC, CBCD, CMP,ESR, B2M, CRP,

## 2013-11-10 NOTE — Progress Notes (Signed)
Jacqualine Code presents today for injection per MD orders. Aranesp 500 mcg administered SQ in right lower abdomen. Administration without incident. Patient tolerated well.

## 2013-11-13 LAB — KAPPA/LAMBDA LIGHT CHAINS
Kappa free light chain: 7.25 mg/dL — ABNORMAL HIGH (ref 0.33–1.94)
Kappa, lambda light chain ratio: 2.3 — ABNORMAL HIGH (ref 0.26–1.65)
Lambda free light chains: 3.15 mg/dL — ABNORMAL HIGH (ref 0.57–2.63)

## 2013-11-13 LAB — BETA 2 MICROGLOBULIN, SERUM: BETA 2 MICROGLOBULIN: 14.4 mg/L — AB (ref <=2.51)

## 2013-11-14 LAB — MULTIPLE MYELOMA PANEL, SERUM
ALBUMIN ELP: 63.4 % (ref 55.8–66.1)
ALPHA-1-GLOBULIN: 4.5 % (ref 2.9–4.9)
Alpha-2-Globulin: 9.1 % (ref 7.1–11.8)
Beta 2: 3.5 % (ref 3.2–6.5)
Beta Globulin: 5.7 % (ref 4.7–7.2)
Gamma Globulin: 13.8 % (ref 11.1–18.8)
IGA: 97 mg/dL (ref 68–379)
IGG (IMMUNOGLOBIN G), SERUM: 885 mg/dL (ref 650–1600)
IGM, SERUM: 83 mg/dL (ref 41–251)
M-Spike, %: NOT DETECTED g/dL
TOTAL PROTEIN: 6.4 g/dL (ref 6.0–8.3)

## 2013-11-23 ENCOUNTER — Other Ambulatory Visit (HOSPITAL_COMMUNITY): Payer: Self-pay | Admitting: Oncology

## 2013-11-23 DIAGNOSIS — C9000 Multiple myeloma not having achieved remission: Secondary | ICD-10-CM

## 2013-11-23 MED ORDER — POMALIDOMIDE 1 MG PO CAPS: ORAL_CAPSULE | ORAL | Status: DC

## 2013-11-29 ENCOUNTER — Telehealth (HOSPITAL_COMMUNITY): Payer: Self-pay | Admitting: Oncology

## 2013-11-29 NOTE — Telephone Encounter (Signed)
[name] wanted to review his labs from June.  I personally reviewed and went over laboratory results with the patient.  The results are noted within this dictation.  [name] reports that his his renal physician has requested additional blood work, but I do not see any special orders outside of our typical orders.  [name] will contact them tomorrow.   [name] 11/29/2013

## 2013-12-01 ENCOUNTER — Encounter (HOSPITAL_COMMUNITY): Payer: PRIVATE HEALTH INSURANCE | Attending: Oncology

## 2013-12-01 ENCOUNTER — Encounter (HOSPITAL_BASED_OUTPATIENT_CLINIC_OR_DEPARTMENT_OTHER): Payer: PRIVATE HEALTH INSURANCE

## 2013-12-01 VITALS — BP 157/79 | HR 54 | Resp 18

## 2013-12-01 DIAGNOSIS — C9000 Multiple myeloma not having achieved remission: Secondary | ICD-10-CM | POA: Diagnosis not present

## 2013-12-01 DIAGNOSIS — T50995A Adverse effect of other drugs, medicaments and biological substances, initial encounter: Secondary | ICD-10-CM | POA: Diagnosis not present

## 2013-12-01 DIAGNOSIS — D631 Anemia in chronic kidney disease: Secondary | ICD-10-CM

## 2013-12-01 DIAGNOSIS — N189 Chronic kidney disease, unspecified: Secondary | ICD-10-CM

## 2013-12-01 DIAGNOSIS — N039 Chronic nephritic syndrome with unspecified morphologic changes: Secondary | ICD-10-CM

## 2013-12-01 LAB — CBC
HCT: 33.1 % — ABNORMAL LOW (ref 39.0–52.0)
Hemoglobin: 11 g/dL — ABNORMAL LOW (ref 13.0–17.0)
MCH: 34.4 pg — ABNORMAL HIGH (ref 26.0–34.0)
MCHC: 33.2 g/dL (ref 30.0–36.0)
MCV: 103.4 fL — ABNORMAL HIGH (ref 78.0–100.0)
Platelets: 45 10*3/uL — ABNORMAL LOW (ref 150–400)
RBC: 3.2 MIL/uL — ABNORMAL LOW (ref 4.22–5.81)
RDW: 16.1 % — AB (ref 11.5–15.5)
WBC: 3.9 10*3/uL — ABNORMAL LOW (ref 4.0–10.5)

## 2013-12-01 MED ORDER — DARBEPOETIN ALFA-POLYSORBATE 500 MCG/ML IJ SOLN
INTRAMUSCULAR | Status: AC
  Filled 2013-12-01: qty 1

## 2013-12-01 MED ORDER — DARBEPOETIN ALFA-POLYSORBATE 500 MCG/ML IJ SOLN
500.0000 ug | Freq: Once | INTRAMUSCULAR | Status: AC
  Administered 2013-12-01: 500 ug via SUBCUTANEOUS

## 2013-12-01 NOTE — Progress Notes (Signed)
LABS DRAWN FOR CBC.  ALSO SENT OUT MG FOR DR [facility]

## 2013-12-01 NOTE — Progress Notes (Signed)
Jacqualine Code presents today for injection per MD orders. Aranesp 56mcg administered SQ in left Abdomen by Ofilia Neas RN. Administration without incident. Patient tolerated well.

## 2013-12-02 ENCOUNTER — Emergency Department (HOSPITAL_COMMUNITY): Payer: PRIVATE HEALTH INSURANCE

## 2013-12-02 ENCOUNTER — Other Ambulatory Visit: Payer: Self-pay

## 2013-12-02 ENCOUNTER — Emergency Department (HOSPITAL_COMMUNITY)
Admission: EM | Admit: 2013-12-02 | Discharge: 2013-12-02 | Disposition: A | Discharge status: Home / Self Care | Payer: PRIVATE HEALTH INSURANCE | Attending: Emergency Medicine | Admitting: Emergency Medicine

## 2013-12-02 ENCOUNTER — Encounter (HOSPITAL_COMMUNITY): Payer: Self-pay | Admitting: Emergency Medicine

## 2013-12-02 DIAGNOSIS — R42 Dizziness and giddiness: Secondary | ICD-10-CM | POA: Diagnosis present

## 2013-12-02 DIAGNOSIS — Z87898 Personal history of other specified conditions: Secondary | ICD-10-CM | POA: Diagnosis not present

## 2013-12-02 DIAGNOSIS — Z79899 Other long term (current) drug therapy: Secondary | ICD-10-CM | POA: Diagnosis not present

## 2013-12-02 DIAGNOSIS — E559 Vitamin D deficiency, unspecified: Secondary | ICD-10-CM | POA: Diagnosis not present

## 2013-12-02 DIAGNOSIS — I10 Essential (primary) hypertension: Secondary | ICD-10-CM | POA: Diagnosis not present

## 2013-12-02 LAB — CBC WITH DIFFERENTIAL/PLATELET
BASOS PCT: 0 % (ref 0–1)
Basophils Absolute: 0 10*3/uL (ref 0.0–0.1)
EOS ABS: 0.1 10*3/uL (ref 0.0–0.7)
Eosinophils Relative: 2 % (ref 0–5)
HCT: 30.5 % — ABNORMAL LOW (ref 39.0–52.0)
HEMOGLOBIN: 10.3 g/dL — AB (ref 13.0–17.0)
Lymphocytes Relative: 50 % — ABNORMAL HIGH (ref 12–46)
Lymphs Abs: 1.8 10*3/uL (ref 0.7–4.0)
MCH: 34.2 pg — ABNORMAL HIGH (ref 26.0–34.0)
MCHC: 33.8 g/dL (ref 30.0–36.0)
MCV: 101.3 fL — ABNORMAL HIGH (ref 78.0–100.0)
MONO ABS: 0.3 10*3/uL (ref 0.1–1.0)
Monocytes Relative: 9 % (ref 3–12)
NEUTROS ABS: 1.4 10*3/uL — AB (ref 1.7–7.7)
NEUTROS PCT: 39 % — AB (ref 43–77)
Platelets: 42 10*3/uL — ABNORMAL LOW (ref 150–400)
RBC: 3.01 MIL/uL — AB (ref 4.22–5.81)
RDW: 16 % — AB (ref 11.5–15.5)
WBC: 3.7 10*3/uL — AB (ref 4.0–10.5)

## 2013-12-02 LAB — COMPREHENSIVE METABOLIC PANEL
ALBUMIN: 3.3 g/dL — AB (ref 3.5–5.2)
ALT: 18 U/L (ref 0–53)
AST: 19 U/L (ref 0–37)
Alkaline Phosphatase: 39 U/L (ref 39–117)
Anion gap: 13 (ref 5–15)
BUN: 67 mg/dL — ABNORMAL HIGH (ref 6–23)
CO2: 19 meq/L (ref 19–32)
CREATININE: 4.2 mg/dL — AB (ref 0.50–1.35)
Calcium: 9.1 mg/dL (ref 8.4–10.5)
Chloride: 104 mEq/L (ref 96–112)
GFR calc Af Amer: 16 mL/min — ABNORMAL LOW (ref >90)
GFR, EST NON AFRICAN AMERICAN: 13 mL/min — AB (ref >90)
Glucose, Bld: 112 mg/dL — ABNORMAL HIGH (ref 70–99)
Potassium: 4.5 mEq/L (ref 3.7–5.3)
SODIUM: 136 meq/L — AB (ref 137–147)
TOTAL PROTEIN: 6 g/dL (ref 6.0–8.3)
Total Bilirubin: 0.3 mg/dL (ref 0.3–1.2)

## 2013-12-02 MED ORDER — MECLIZINE HCL 25 MG PO TABS: 25.0000 mg | ORAL_TABLET | Freq: Three times a day (TID) | ORAL | Status: DC | PRN

## 2013-12-02 MED ORDER — MECLIZINE HCL 12.5 MG PO TABS
25.0000 mg | ORAL_TABLET | Freq: Once | ORAL | Status: AC
  Administered 2013-12-02: 25 mg via ORAL
  Filled 2013-12-02: qty 2

## 2013-12-02 NOTE — ED Notes (Addendum)
Pt states dizziness began Friday when he woke up. States he becomes dizzy when he stands up. NAD. Pt currently has a steady gait.

## 2013-12-02 NOTE — Discharge Instructions (Signed)
Dizziness °Dizziness is a common problem. It is a feeling of unsteadiness or light-headedness. You may feel like you are about to faint. Dizziness can lead to injury if you stumble or fall. A person of any age group can suffer from dizziness, but dizziness is more common in older adults. °CAUSES  °Dizziness can be caused by many different things, including: °· Middle ear problems. °· Standing for too long. °· Infections. °· An allergic reaction. °· Aging. °· An emotional response to something, such as the sight of blood. °· Side effects of medicines. °· Tiredness. °· Problems with circulation or blood pressure. °· Excessive use of alcohol or medicines, or illegal drug use. °· Breathing too fast (hyperventilation). °· An irregular heart rhythm (arrhythmia). °· A low red blood cell count (anemia). °· Pregnancy. °· Vomiting, diarrhea, fever, or other illnesses that cause body fluid loss (dehydration). °· Diseases or conditions such as Parkinson's disease, high blood pressure (hypertension), diabetes, and thyroid problems. °· Exposure to extreme heat. °DIAGNOSIS  °Your health care provider will ask about your symptoms, perform a physical exam, and perform an electrocardiogram (ECG) to record the electrical activity of your heart. Your health care provider may also perform other heart or blood tests to determine the cause of your dizziness. These may include: °· Transthoracic echocardiogram (TTE). During echocardiography, sound waves are used to evaluate how blood flows through your heart. °· Transesophageal echocardiogram (TEE). °· Cardiac monitoring. This allows your health care provider to monitor your heart rate and rhythm in real time. °· Holter monitor. This is a portable device that records your heartbeat and can help diagnose heart arrhythmias. It allows your health care provider to track your heart activity for several days if needed. °· Stress tests by exercise or by giving medicine that makes the heart beat  faster. °TREATMENT  °Treatment of dizziness depends on the cause of your symptoms and can vary greatly. °HOME CARE INSTRUCTIONS  °· Drink enough fluids to keep your urine clear or pale yellow. This is especially important in very hot weather. In older adults, it is also important in cold weather. °· Take your medicine exactly as directed if your dizziness is caused by medicines. When taking blood pressure medicines, it is especially important to get up slowly. °¨ Rise slowly from chairs and steady yourself until you feel okay. °¨ In the morning, first sit up on the side of the bed. When you feel okay, stand slowly while holding onto something until you know your balance is fine. °· Move your legs often if you need to stand in one place for a long time. Tighten and relax your muscles in your legs while standing. °· Have someone stay with you for 1-2 days if dizziness continues to be a problem. Do this until you feel you are well enough to stay alone. Have the person call your health care provider if he or she notices changes in you that are concerning. °· Do not drive or use heavy machinery if you feel dizzy. °· Do not drink alcohol. °SEEK IMMEDIATE MEDICAL CARE IF:  °· Your dizziness or light-headedness gets worse. °· You feel nauseous or vomit. °· You have problems talking, walking, or using your arms, hands, or legs. °· You feel weak. °· You are not thinking clearly or you have trouble forming sentences. It may take a friend or family member to notice this. °· You have chest pain, abdominal pain, shortness of breath, or sweating. °· Your vision changes. °· You notice   any bleeding.  You have side effects from medicine that seems to be getting worse rather than better. MAKE SURE YOU:   Understand these instructions.  Will watch your condition.  Will get help right away if you are not doing well or get worse. Document Released: 10/28/2000 Document Revised: 05/09/2013 Document Reviewed: 11/21/2010 [facility]  Patient Information 2015 [place], [place]. This information is not intended to replace advice given to you by your health care provider. Make sure you discuss any questions you have with your health care provider.  Return for any new or worse symptoms. Take the Antivert as needed for the dizziness. Followup with your doctor in the next few days.

## 2013-12-02 NOTE — ED Provider Notes (Signed)
CSN: [id number]     Arrival date & time 12/02/13  1708 History   First MD Initiated Contact with Patient 12/02/13 1718     Chief Complaint  Patient presents with  . Dizziness     (Consider location/radiation/quality/duration/timing/severity/associated sxs/prior Treatment) The history is provided by the patient.   68 year old male with onset of dizziness lightheadedness Friday morning worse with movement but not worse with moving his head rapidly. Worse more when he stands up at first. Also felt a little off balance. No weakness to his arms or legs no numbness. No visual changes. No true room spinning. A shunt without any recent change in his blood pressure medicines. He was told to increase his blood pressure pills on Friday but symptoms were present prior to doing that. Patient has a history of multiple myeloma followed by hemoptysis tears. Patient also has known renal insufficiency. Patient used to be a Actuary per L. The renal insufficiency creatinine up [place] that so they stopped that. Patient currently taking hydralazine and Coreg for the elevated blood pressure.  Past Medical History  Diagnosis Date  . Hypertension   . Ruptured cervical disc   . Fall resulting in striking against other object     paralyzed  . Multiple myeloma   . 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 Multiple myeloma 01/26/2011  . Unspecified vitamin D deficiency 03/06/2013   Past Surgical History  Procedure Laterality Date  . Inguinal hernia repair    . Anterior cervical decomp/discectomy fusion    . Bone marrow aspirate and biopsy wiith lumbar puncture    . Melanoma excision      rt. breast  . Cervical spine surgery     Family History  Problem Relation Age of Onset  . Cancer Sister   . Cancer Brother    History  Substance Use Topics  . Smoking status: Never Smoker   . Smokeless tobacco: Never Used  . Alcohol Use: No    Review of Systems  Constitutional: Negative for  fever and fatigue.  HENT: Negative for congestion.   Eyes: Negative for visual disturbance.  Respiratory: Negative for shortness of breath.   Cardiovascular: Negative for chest pain.  Gastrointestinal: Negative for nausea, vomiting and abdominal pain.  Genitourinary: Negative for hematuria.  Musculoskeletal: Positive for myalgias.  Skin: Negative for rash.  Neurological: Positive for dizziness and light-headedness. Negative for speech difficulty, weakness, numbness and headaches.  Hematological: Does not bruise/bleed easily.  Psychiatric/Behavioral: Negative for confusion.      Allergies  Pamidronate  Home Medications   Prior to Admission medications   Medication Sig Start Date End Date Taking? Authorizing Provider  carvedilol (COREG) 6.25 MG tablet Take 6.25 mg by mouth 2 (two) times daily with a meal.  06/12/13  Yes Historical Provider, MD  fish oil-omega-3 fatty acids 1000 MG capsule Take 2 g by mouth daily.   Yes Historical Provider, MD  gabapentin (NEURONTIN) 300 MG capsule Take 300 mg by mouth daily.   Yes Historical Provider, MD  hydrALAZINE (APRESOLINE) 25 MG tablet Take 50 mg by mouth 3 (three) times daily.   Yes Historical Provider, MD  magnesium hydroxide (MILK OF MAGNESIA) 400 MG/5ML suspension Take 30 mLs by mouth daily as needed for mild constipation.   Yes Historical Provider, MD  pomalidomide (POMALYST) 1 MG capsule Take 1 tablet every other day 14 days and then 7 days off, then repeat 11/23/13  Yes Baird Cancer, PA-C  torsemide [facility])  20 MG tablet Take 40 mg by mouth every other day.  09/07/13  Yes Historical Provider, MD  Cholecalciferol (VITAMIN D-3) 1000 UNITS CAPS Take 1 capsule by mouth daily.    Historical Provider, MD  Cyanocobalamin (B-12 PO) Take 1 tablet by mouth daily.    Historical Provider, MD  meclizine (ANTIVERT) 25 MG tablet Take 1 tablet (25 mg total) by mouth 3 (three) times daily as needed for dizziness. 12/02/13   Fredia Sorrow, MD  sodium  polystyrene (KAYEXALATE) 15 GM/60ML suspension Take 15 g by mouth once a week. Sundays    Historical Provider, MD   BP 175/90  Pulse 50  Temp(Src) 98.2 F (36.8 C) (Oral)  Resp 12  Ht 6' (1.829 m)  Wt 245 lb (111.131 kg)  BMI 33.22 kg/m2  SpO2 96% Physical Exam  Nursing note and vitals reviewed. Constitutional: He is oriented to person, place, and time. He appears well-developed and well-nourished. No distress.  HENT:  Head: Normocephalic.  Mouth/Throat: Oropharynx is clear and moist.  Eyes: Conjunctivae and EOM are normal. Pupils are equal, round, and reactive to light.  Neck: Normal range of motion.  Cardiovascular: Normal rate and regular rhythm.   Pulmonary/Chest: Effort normal and breath sounds normal.  Abdominal: Soft. Bowel sounds are normal. There is no tenderness.  Musculoskeletal: Normal range of motion. He exhibits no edema and no tenderness.  Neurological: He is alert and oriented to person, place, and time. No cranial nerve deficit. He exhibits normal muscle tone. Coordination normal.  Skin: Skin is warm. No rash noted.    ED Course  Procedures (including critical care time) Labs Review Labs Reviewed  COMPREHENSIVE METABOLIC PANEL - Abnormal; Notable for the following:    Sodium 136 (*)    Glucose, Bld 112 (*)    BUN 67 (*)    Creatinine, Ser 4.20 (*)    Albumin 3.3 (*)    GFR calc non Af Amer 13 (*)    GFR calc Af Amer 16 (*)    All other components within normal limits  CBC WITH DIFFERENTIAL - Abnormal; Notable for the following:    WBC 3.7 (*)    RBC 3.01 (*)    Hemoglobin 10.3 (*)    HCT 30.5 (*)    MCV 101.3 (*)    MCH 34.2 (*)    RDW 16.0 (*)    Platelets 42 (*)    Neutrophils Relative % 39 (*)    Neutro Abs 1.4 (*)    Lymphocytes Relative 50 (*)    All other components within normal limits    Imaging Review Dg Chest 2 View  12/02/2013   CLINICAL DATA:  Dizziness.  Weakness.  EXAM: CHEST  2 VIEW  COMPARISON:  None.  FINDINGS: The heart size  and mediastinal contours are within normal limits. Both lungs are clear. No evidence pleural effusion. Old fracture deformity of the right lateral seventh rib is noted.  IMPRESSION: No active cardiopulmonary disease.   Electronically Signed   By: Earle Gell M.D.   On: 12/02/2013 20:00   Ct Head Wo Contrast  12/02/2013   CLINICAL DATA:  Dizziness  EXAM: CT HEAD WITHOUT CONTRAST  TECHNIQUE: Contiguous axial images were obtained from the base of the skull through the vertex without intravenous contrast.  COMPARISON:  None.  FINDINGS: There is no evidence of mass effect, midline shift, or extra-axial fluid collections. There is no evidence of a space-occupying lesion or intracranial hemorrhage. There is no evidence of a cortical-based  area of acute infarction. There is generalized cerebral atrophy. There is periventricular white matter low attenuation likely secondary to microangiopathy.  The ventricles and sulci are appropriate for the patient's age. The basal cisterns are patent.  Visualized portions of the orbits are unremarkable. Small left maxillary sinus air-fluid level. Cerebrovascular atherosclerotic calcifications are noted.  The osseous structures are unremarkable.  IMPRESSION: No acute intracranial pathology.   Electronically Signed   By: Kathreen Devoid   On: 12/02/2013 19:55     EKG Interpretation None      Date: 12/02/2013  Rate: 53  Rhythm: sinus arrhythmia  QRS Axis: normal  Intervals: normal  ST/T Wave abnormalities: nonspecific ST/T changes and early repolarization  Conduction Disutrbances:nonspecific intraventricular conduction delay  Narrative Interpretation:   Old EKG Reviewed: none available        MDM   Final diagnoses:  Dizziness   The patient presented for dizziness feeling started yesterday morning. No true room spinning but worse with getting up and came down a little off balance. No focal neuro deficits. EKG without any significant arrhythmias. Head CT was  negative. Patient's lab work essentially baseline for him. Patient improved here with Antivert. The patient was thinking that maybe his blood pressure meds was playing a role. Patient able to ambulate without difficulty blood pressure did not drop when he stood up actually went up. So no evidence of any orthostatic hypotension or any symptomatic orthostatic changes. Patient will followup with his doctor for blood pressure rechecked. We'll continue the Antivert return for any new or worse symptoms.     Fredia Sorrow, MD 12/02/13 2101

## 2013-12-02 NOTE — ED Notes (Signed)
Pt. Up to bathroom by himself, pt. Reminded to call staff if he needs to get up due to his dizziness.

## 2013-12-08 ENCOUNTER — Other Ambulatory Visit (HOSPITAL_COMMUNITY): Payer: Self-pay | Admitting: Oncology

## 2013-12-08 DIAGNOSIS — C9000 Multiple myeloma not having achieved remission: Secondary | ICD-10-CM

## 2013-12-08 MED ORDER — POMALIDOMIDE 1 MG PO CAPS: ORAL_CAPSULE | ORAL | Status: DC

## 2013-12-22 ENCOUNTER — Encounter (HOSPITAL_COMMUNITY): Payer: PRIVATE HEALTH INSURANCE | Attending: Oncology

## 2013-12-22 ENCOUNTER — Ambulatory Visit (HOSPITAL_COMMUNITY): Payer: PRIVATE HEALTH INSURANCE

## 2013-12-22 DIAGNOSIS — T50995A Adverse effect of other drugs, medicaments and biological substances, initial encounter: Secondary | ICD-10-CM | POA: Diagnosis not present

## 2013-12-22 DIAGNOSIS — C9000 Multiple myeloma not having achieved remission: Secondary | ICD-10-CM | POA: Diagnosis not present

## 2013-12-22 LAB — CBC WITH DIFFERENTIAL/PLATELET
BASOS ABS: 0 10*3/uL (ref 0.0–0.1)
Basophils Relative: 0 % (ref 0–1)
Eosinophils Absolute: 0.1 10*3/uL (ref 0.0–0.7)
Eosinophils Relative: 2 % (ref 0–5)
HCT: 35.9 % — ABNORMAL LOW (ref 39.0–52.0)
HEMOGLOBIN: 11.9 g/dL — AB (ref 13.0–17.0)
Lymphocytes Relative: 54 % — ABNORMAL HIGH (ref 12–46)
Lymphs Abs: 2.3 10*3/uL (ref 0.7–4.0)
MCH: 34.4 pg — ABNORMAL HIGH (ref 26.0–34.0)
MCHC: 33.1 g/dL (ref 30.0–36.0)
MCV: 103.8 fL — ABNORMAL HIGH (ref 78.0–100.0)
MONOS PCT: 7 % (ref 3–12)
Monocytes Absolute: 0.3 10*3/uL (ref 0.1–1.0)
NEUTROS ABS: 1.6 10*3/uL — AB (ref 1.7–7.7)
Neutrophils Relative %: 37 % — ABNORMAL LOW (ref 43–77)
Platelets: 46 10*3/uL — ABNORMAL LOW (ref 150–400)
RBC: 3.46 MIL/uL — ABNORMAL LOW (ref 4.22–5.81)
RDW: 15.6 % — ABNORMAL HIGH (ref 11.5–15.5)
WBC: 4.2 10*3/uL (ref 4.0–10.5)

## 2013-12-22 LAB — COMPREHENSIVE METABOLIC PANEL
ALBUMIN: 3.6 g/dL (ref 3.5–5.2)
ALK PHOS: 41 U/L (ref 39–117)
ALT: 17 U/L (ref 0–53)
AST: 21 U/L (ref 0–37)
Anion gap: 14 (ref 5–15)
BILIRUBIN TOTAL: 0.5 mg/dL (ref 0.3–1.2)
BUN: 56 mg/dL — AB (ref 6–23)
CHLORIDE: 103 meq/L (ref 96–112)
CO2: 21 mEq/L (ref 19–32)
Calcium: 8.3 mg/dL — ABNORMAL LOW (ref 8.4–10.5)
Creatinine, Ser: 3.84 mg/dL — ABNORMAL HIGH (ref 0.50–1.35)
GFR calc Af Amer: 17 mL/min — ABNORMAL LOW (ref >90)
GFR calc non Af Amer: 15 mL/min — ABNORMAL LOW (ref >90)
Glucose, Bld: 101 mg/dL — ABNORMAL HIGH (ref 70–99)
Potassium: 4.5 mEq/L (ref 3.7–5.3)
Sodium: 138 mEq/L (ref 137–147)
Total Protein: 6.7 g/dL (ref 6.0–8.3)

## 2013-12-22 LAB — SEDIMENTATION RATE: Sed Rate: 10 mm/hr (ref 0–16)

## 2013-12-22 LAB — C-REACTIVE PROTEIN

## 2013-12-22 NOTE — Progress Notes (Signed)
Patient notified

## 2013-12-22 NOTE — Progress Notes (Signed)
LABS DRAWN FOR CBCD,CMP,ESR,B2M,CRP,MM,KLLC

## 2013-12-25 LAB — BETA 2 MICROGLOBULIN, SERUM: Beta-2 Microglobulin: 12.3 mg/L — ABNORMAL HIGH (ref <=2.51)

## 2013-12-25 LAB — KAPPA/LAMBDA LIGHT CHAINS
Kappa free light chain: 6.73 mg/dL — ABNORMAL HIGH (ref 0.33–1.94)
Kappa, lambda light chain ratio: 2.26 — ABNORMAL HIGH (ref 0.26–1.65)
LAMDA FREE LIGHT CHAINS: 2.98 mg/dL — AB (ref 0.57–2.63)

## 2013-12-26 LAB — MULTIPLE MYELOMA PANEL, SERUM
ALBUMIN ELP: 63.3 % (ref 55.8–66.1)
ALPHA-2-GLOBULIN: 8.9 % (ref 7.1–11.8)
Alpha-1-Globulin: 4.5 % (ref 2.9–4.9)
Beta 2: 4 % (ref 3.2–6.5)
Beta Globulin: 5.8 % (ref 4.7–7.2)
Gamma Globulin: 13.5 % (ref 11.1–18.8)
IGA: 97 mg/dL (ref 68–379)
IGG (IMMUNOGLOBIN G), SERUM: 917 mg/dL (ref 650–1600)
IGM, SERUM: 87 mg/dL (ref 41–251)
M-Spike, %: NOT DETECTED g/dL
TOTAL PROTEIN: 6.4 g/dL (ref 6.0–8.3)

## 2013-12-29 ENCOUNTER — Ambulatory Visit (HOSPITAL_COMMUNITY): Payer: PRIVATE HEALTH INSURANCE | Admitting: Oncology

## 2013-12-30 ENCOUNTER — Encounter (HOSPITAL_COMMUNITY): Payer: Self-pay | Admitting: Oncology

## 2013-12-30 DIAGNOSIS — N185 Chronic kidney disease, stage 5: Secondary | ICD-10-CM

## 2013-12-30 DIAGNOSIS — D638 Anemia in other chronic diseases classified elsewhere: Secondary | ICD-10-CM

## 2013-12-30 HISTORY — DX: Chronic kidney disease, stage 5: N18.5

## 2013-12-30 HISTORY — DX: Anemia in other chronic diseases classified elsewhere: D63.8

## 2013-12-30 NOTE — Progress Notes (Signed)
[name], [place] [address]  Multiple myeloma  Anemia of chronic disease  Chronic renal disease, stage 5, glomerular filtration rate less than or equal to 15 mL/min/1.73 square meter  CURRENT THERAPY: Pomalyst every other day x 14 days followed by 7 day respite, then repeated. Dexamethasone D/C'd due to patient reported intolerance. Anaphylactic reaction to bisphosphonate therapy, D/C'd.  Aranesp 500 mcg every 21 days for Hgb less than or equal to 11 g/dL.   INTERVAL HISTORY: [name] 68 y.o. 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 Marcell Anger at [facility].  Chart reviewed.  ED notes noted for dizziness, negative work-up including CT of head and negative EKG.  Improved with antivert.  I personally reviewed and went over laboratory results with the patient.  The results are noted within this dictation.  Kappa and lambda light chains are noted to be elevated over the past 6 months or so, with a recent elevation in kappa:lambda light chain ratio that is is stable over the past 2 months.  M-spike continues to be non-detectable.  I personally reviewed and went over radiographic studies with the patient.  The results are noted within this dictation.    He reports that he is feeling well.  He is following-up with his cardiologist for BP control.  His BP today is solid.  Hematologically, he denies any complaints and ROS questioning is negative.   Past Medical History  Diagnosis Date  . Hypertension   . Ruptured cervical disc   . Fall resulting in striking against other object     paralyzed  . Multiple myeloma   . 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 Multiple myeloma 01/26/2011  . Unspecified vitamin D deficiency 03/06/2013  . Anemia of chronic disease 12/30/2013  . Chronic renal disease,  stage 5, glomerular filtration rate less than or equal to 15 mL/min/1.73 square meter 12/30/2013    has ESSENTIAL HYPERTENSION, BENIGN; BRADYCARDIA; Multiple myeloma; Intravenous pamidronate causing adverse effect in therapeutic use; Unspecified vitamin D deficiency; Lymphedema of lower extremity; Anemia of chronic disease; and Chronic renal disease, stage 5, glomerular filtration rate less than or equal to 15 mL/min/1.73 square meter on his problem list.     is allergic to pamidronate.  Mr. Jansson does not currently have medications on file.  Past Surgical History  Procedure Laterality Date  . Inguinal hernia repair    . Anterior cervical decomp/discectomy fusion    . Bone marrow aspirate and biopsy wiith lumbar puncture    . Melanoma excision      rt. breast  . Cervical spine surgery      Denies any headaches, dizziness, double vision, fevers, chills, night sweats, nausea, vomiting, diarrhea, constipation, chest pain, heart palpitations, shortness of breath, blood in stool, black tarry stool, urinary pain, urinary burning, urinary frequency, hematuria.   PHYSICAL EXAMINATION  ECOG PERFORMANCE STATUS: 0 - Asymptomatic  Filed Vitals:   01/01/14 1445  BP: 135/84  Pulse: 49  Temp: 98.1 F (36.7 C)  Resp: 18    GENERAL:alert, no distress, well nourished, well developed, comfortable, cooperative, obese and smiling SKIN: skin color, texture, turgor are normal, no rashes or significant lesions HEAD: Normocephalic, No masses, lesions, tenderness or abnormalities EYES: normal, PERRLA, EOMI, Conjunctiva are pink and non-injected EARS: External ears normal OROPHARYNX:lips, buccal mucosa, and tongue  normal and mucous membranes are moist  NECK: supple, no adenopathy, thyroid normal size, non-tender, without nodularity, no stridor, non-tender, trachea midline LYMPH:  no palpable lymphadenopathy BREAST:not examined LUNGS: clear to auscultation  HEART: regular rate & rhythm, no murmurs,  no gallops, S1 normal and S2 normal ABDOMEN:abdomen soft, non-tender, obese, normal bowel sounds and no masses or organomegaly BACK: Back symmetric, no curvature. EXTREMITIES:less then 2 second capillary refill, no joint deformities, effusion, or inflammation, no edema, no skin discoloration, no clubbing, no cyanosis  NEURO: alert & oriented x 3 with fluent speech, no focal motor/sensory deficits, gait normal   LABORATORY DATA: CBC    Component Value Date/Time   WBC 4.2 12/22/2013 0858   RBC 3.46* 12/22/2013 0858   HGB 11.9* 12/22/2013 0858   HCT 35.9* 12/22/2013 0858   PLT 46* 12/22/2013 0858   MCV 103.8* 12/22/2013 0858   MCH 34.4* 12/22/2013 0858   MCHC 33.1 12/22/2013 0858   RDW 15.6* 12/22/2013 0858   LYMPHSABS 2.3 12/22/2013 0858   MONOABS 0.3 12/22/2013 0858   EOSABS 0.1 12/22/2013 0858   BASOSABS 0.0 12/22/2013 0858      Chemistry      Component Value Date/Time   NA 138 12/22/2013 0858   K 4.5 12/22/2013 0858   CL 103 12/22/2013 0858   CO2 21 12/22/2013 0858   BUN 56* 12/22/2013 0858   CREATININE 3.84* 12/22/2013 0858   CREATININE 43.09* 01/26/2012 1052      Component Value Date/Time   CALCIUM 8.3* 12/22/2013 0858   ALKPHOS 41 12/22/2013 0858   AST 21 12/22/2013 0858   ALT 17 12/22/2013 0858   BILITOT 0.5 12/22/2013 0858     Lab Results  Component Value Date   ESRSEDRATE 10 12/22/2013   Results for AUDLEY, HINOJOS (MRN [id number]) as of 12/30/2013 10:54  Ref. Range 12/22/2013 08:58  Beta-2 Microglobulin Latest Range: <=2.51 mg/L 12.30 (H)   Lab Results  Component Value Date   CRP <0.5* 12/22/2013   Results for FINNEGAN, GATTA (MRN [id number]) as of 12/30/2013 10:54  Ref. Range 12/22/2013 08:57  Albumin ELP Latest Range: 55.8-66.1 % 63.3  Alpha-1-Globulin Latest Range: 2.9-4.9 % 4.5  Alpha-2-Globulin Latest Range: 7.1-11.8 % 8.9  Beta Globulin Latest Range: 4.7-7.2 % 5.8  Beta 2 Latest Range: 3.2-6.5 % 4.0  Gamma Globulin Latest Range: 11.1-18.8 % 13.5  M-SPIKE, % No range found NOT DETECTED  SPE  Interp. No range found (NOTE)  Comment No range found (NOTE)  IgG (Immunoglobin G), Serum Latest Range: 318-439-0193 mg/dL 917  IgA Latest Range: 68-379 mg/dL 97  IgM, Serum Latest Range: 41-251 mg/dL 87   Results for SUNIL, HUE (MRN [id number]) as of 12/30/2013 10:54  Ref. Range 12/22/2013 08:58  Kappa free light chain Latest Range: 0.33-1.94 mg/dL 6.73 (H)  Lamda free light chains Latest Range: 0.57-2.63 mg/dL 2.98 (H)  Kappa, lamda light chain ratio Latest Range: 0.26-1.65  2.26 (H)   Results for BODEN, STUCKY (MRN [id number]) as of 12/30/2013 10:54  Ref. Range 11/10/2013 09:09  Kappa free light chain Latest Range: 0.33-1.94 mg/dL 7.25 (H)  Lamda free light chains Latest Range: 0.57-2.63 mg/dL 3.15 (H)  Kappa, lamda light chain ratio Latest Range: 0.26-1.65  2.30 (H)    Results for KYCEN, SPALLA (MRN [id number]) as of 12/30/2013 10:54  Ref. Range 09/29/2013 08:53  Kappa free light chain Latest Range: 0.33-1.94 mg/dL 5.90 (H)  Lamda free light chains Latest Range: 0.57-2.63 mg/dL 4.18 (H)  Kappa, lamda  light chain ratio Latest Range: 0.26-1.65  1.41      RADIOGRAPHIC STUDIES:  12/02/2013  CLINICAL DATA: Dizziness  EXAM:  CT HEAD WITHOUT CONTRAST  TECHNIQUE:  Contiguous axial images were obtained from the base of the skull  through the vertex without intravenous contrast.  COMPARISON: None.  FINDINGS:  There is no evidence of mass effect, midline shift, or extra-axial  fluid collections. There is no evidence of a space-occupying lesion  or intracranial hemorrhage. There is no evidence of a cortical-based  area of acute infarction. There is generalized cerebral atrophy.  There is periventricular white matter low attenuation likely  secondary to microangiopathy.  The ventricles and sulci are appropriate for the patient's age. The  basal cisterns are patent.  Visualized portions of the orbits are unremarkable. Small left  maxillary sinus air-fluid level. Cerebrovascular  atherosclerotic  calcifications are noted.  The osseous structures are unremarkable.  IMPRESSION:  No acute intracranial pathology.  Electronically Signed  By: Kathreen Devoid  On: 12/02/2013 19:55    ASSESSMENT:  1. 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 Marcell Anger at [place] restarted on September 05, 2012, 7 days on and 7 days off. Dexamethasone D/C'd due to patient reported intolerance.  2. Anaphylactic reaction to bisphosphonate therapy, D/C'd  3. Chronic Kidney disease, stage 5, followed by nephrology  4. Anemia of chronic disease secondary to #1 and #3. On Aranesp every 3 weeks  Patient Active Problem List   Diagnosis Date Noted  . Anemia of chronic disease 12/30/2013  . Chronic renal disease, stage 5, glomerular filtration rate less than or equal to 15 mL/min/1.73 square meter 12/30/2013  . Lymphedema of lower extremity 08/08/2013  . Unspecified vitamin D deficiency 03/06/2013  . Intravenous pamidronate causing adverse effect in therapeutic use 10/14/2012  . Multiple myeloma 01/26/2011  . ESSENTIAL HYPERTENSION, BENIGN 03/05/2010  . BRADYCARDIA 03/05/2010     PLAN:  1. I personally reviewed and went over laboratory results with the patient.  The results are noted within this dictation. 2. I personally reviewed and went over radiographic studies with the patient.  The results are noted within this dictation.   3. Chart reviewed 4. Labs every 3 weeks: CBC  5. Labs every 6 weeks: CBC diff, CMET, CRP, MM panel, B2M  6. Supportive therapy plan reviewed.  7. Recommend continued follow-up with nephrologist regarding renal function. 8. Return in 3 months for follow-up   THERAPY PLAN:  He will continue with Pomalyst as prescribed and outlined above. We will monitor his labs and support his counts. We will monitor multiple myeloma markers and be on the look out for relapse.   All questions were  answered. The patient knows to call the clinic with any problems, questions or concerns. We can certainly see the patient much sooner if necessary.  Patient and plan discussed with Dr. Farrel Gobble and he is in agreement with the aforementioned.   Lakeita Panther 01/01/2014

## 2014-01-01 ENCOUNTER — Encounter (HOSPITAL_BASED_OUTPATIENT_CLINIC_OR_DEPARTMENT_OTHER): Payer: PRIVATE HEALTH INSURANCE | Admitting: Oncology

## 2014-01-01 ENCOUNTER — Encounter (HOSPITAL_COMMUNITY): Payer: Self-pay | Admitting: Oncology

## 2014-01-01 VITALS — BP 135/84 | HR 49 | Temp 98.1°F | Resp 18 | Wt 250.0 lb

## 2014-01-01 DIAGNOSIS — C9000 Multiple myeloma not having achieved remission: Secondary | ICD-10-CM

## 2014-01-01 DIAGNOSIS — D638 Anemia in other chronic diseases classified elsewhere: Secondary | ICD-10-CM

## 2014-01-01 DIAGNOSIS — N185 Chronic kidney disease, stage 5: Secondary | ICD-10-CM

## 2014-01-01 NOTE — Patient Instructions (Signed)
[facility] Discharge Instructions  RECOMMENDATIONS MADE BY THE CONSULTANT AND ANY TEST RESULTS WILL BE SENT TO YOUR REFERRING PHYSICIAN.  EXAM FINDINGS BY THE PHYSICIAN TODAY AND SIGNS OR SYMPTOMS TO REPORT TO CLINIC OR PRIMARY PHYSICIAN: Exam and findings as discussed by Robynn Pane, PA-C.  No changes at this time.  Labs every 3 weeks and aranesp if needed.  MEDICATIONS PRESCRIBED:  Continue pamolyst  INSTRUCTIONS/FOLLOW-UP: Labs every 3 weeks and office visit in 3 months.  Thank you for choosing [facility] to provide your oncology and hematology care.  To afford each patient quality time with our providers, please arrive at least 15 minutes before your scheduled appointment time.  With your help, our goal is to use those 15 minutes to complete the necessary work-up to ensure our physicians have the information they need to help with your evaluation and healthcare recommendations.    Effective January 1st, 2014, we ask that you re-schedule your appointment with our physicians should you arrive 10 or more minutes late for your appointment.  We strive to give you quality time with our providers, and arriving late affects you and other patients whose appointments are after yours.    Again, thank you for choosing [facility].  Our hope is that these requests will decrease the amount of time that you wait before being seen by our physicians.       _____________________________________________________________  Should you have questions after your visit to [facility], please contact our office at [phone] between the hours of 8:30 a.m. and 4:30 p.m.  Voicemails left after 4:30 p.m. will not be returned until the following business day.  For prescription refill requests, have your pharmacy contact our office with your prescription refill request.    _______________________________________________________________  We hope that  we have given you very good care.  You may receive a patient satisfaction survey in the mail, please complete it and return it as soon as possible.  We value your feedback!  _______________________________________________________________  Have you asked about our STAR program?  STAR stands for Survivorship Training and Rehabilitation, and this is a nationally recognized cancer care program that focuses on survivorship and rehabilitation.  Cancer and cancer treatments may cause problems, such as, pain, making you feel tired and keeping you from doing the things that you need or want to do. Cancer rehabilitation can help. Our goal is to reduce these troubling effects and help you have the best quality of life possible.  You may receive a survey from a nurse that asks questions about your current state of health.  Based on the survey results, all eligible patients will be referred to the [facility] program for an evaluation so we can better serve you!  A frequently asked questions sheet is available upon request.

## 2014-01-04 ENCOUNTER — Other Ambulatory Visit (HOSPITAL_COMMUNITY): Payer: Self-pay

## 2014-01-10 ENCOUNTER — Emergency Department (HOSPITAL_COMMUNITY): Payer: PRIVATE HEALTH INSURANCE

## 2014-01-10 ENCOUNTER — Encounter (HOSPITAL_COMMUNITY): Payer: Self-pay | Admitting: Emergency Medicine

## 2014-01-10 ENCOUNTER — Emergency Department (HOSPITAL_COMMUNITY)
Admission: EM | Admit: 2014-01-10 | Discharge: 2014-01-10 | Disposition: A | Discharge status: Home / Self Care | Payer: PRIVATE HEALTH INSURANCE | Attending: Emergency Medicine | Admitting: Emergency Medicine

## 2014-01-10 DIAGNOSIS — R05 Cough: Secondary | ICD-10-CM | POA: Diagnosis not present

## 2014-01-10 DIAGNOSIS — Z79899 Other long term (current) drug therapy: Secondary | ICD-10-CM | POA: Diagnosis not present

## 2014-01-10 DIAGNOSIS — E559 Vitamin D deficiency, unspecified: Secondary | ICD-10-CM | POA: Insufficient documentation

## 2014-01-10 DIAGNOSIS — R509 Fever, unspecified: Secondary | ICD-10-CM | POA: Insufficient documentation

## 2014-01-10 DIAGNOSIS — J029 Acute pharyngitis, unspecified: Secondary | ICD-10-CM | POA: Diagnosis not present

## 2014-01-10 DIAGNOSIS — N185 Chronic kidney disease, stage 5: Secondary | ICD-10-CM | POA: Insufficient documentation

## 2014-01-10 DIAGNOSIS — Z87828 Personal history of other (healed) physical injury and trauma: Secondary | ICD-10-CM | POA: Diagnosis not present

## 2014-01-10 DIAGNOSIS — C9001 Multiple myeloma in remission: Secondary | ICD-10-CM | POA: Insufficient documentation

## 2014-01-10 DIAGNOSIS — R059 Cough, unspecified: Secondary | ICD-10-CM | POA: Insufficient documentation

## 2014-01-10 DIAGNOSIS — M549 Dorsalgia, unspecified: Secondary | ICD-10-CM | POA: Diagnosis not present

## 2014-01-10 DIAGNOSIS — R6889 Other general symptoms and signs: Secondary | ICD-10-CM | POA: Insufficient documentation

## 2014-01-10 DIAGNOSIS — Z862 Personal history of diseases of the blood and blood-forming organs and certain disorders involving the immune mechanism: Secondary | ICD-10-CM | POA: Insufficient documentation

## 2014-01-10 DIAGNOSIS — I120 Hypertensive chronic kidney disease with stage 5 chronic kidney disease or end stage renal disease: Secondary | ICD-10-CM | POA: Insufficient documentation

## 2014-01-10 LAB — CBC WITH DIFFERENTIAL/PLATELET
Basophils Absolute: 0 10*3/uL (ref 0.0–0.1)
Basophils Relative: 0 % (ref 0–1)
EOS ABS: 0.1 10*3/uL (ref 0.0–0.7)
EOS PCT: 1 % (ref 0–5)
HCT: 29 % — ABNORMAL LOW (ref 39.0–52.0)
HEMOGLOBIN: 10.2 g/dL — AB (ref 13.0–17.0)
LYMPHS ABS: 1.2 10*3/uL (ref 0.7–4.0)
Lymphocytes Relative: 22 % (ref 12–46)
MCH: 34.8 pg — AB (ref 26.0–34.0)
MCHC: 35.2 g/dL (ref 30.0–36.0)
MCV: 99 fL (ref 78.0–100.0)
MONO ABS: 0.6 10*3/uL (ref 0.1–1.0)
Monocytes Relative: 10 % (ref 3–12)
NEUTROS PCT: 67 % (ref 43–77)
Neutro Abs: 3.6 10*3/uL (ref 1.7–7.7)
Platelets: 48 10*3/uL — ABNORMAL LOW (ref 150–400)
RBC: 2.93 MIL/uL — ABNORMAL LOW (ref 4.22–5.81)
RDW: 14.3 % (ref 11.5–15.5)
Smear Review: DECREASED
WBC: 5.5 10*3/uL (ref 4.0–10.5)

## 2014-01-10 LAB — URINALYSIS, ROUTINE W REFLEX MICROSCOPIC
Bilirubin Urine: NEGATIVE
GLUCOSE, UA: NEGATIVE mg/dL
Ketones, ur: NEGATIVE mg/dL
LEUKOCYTES UA: NEGATIVE
Nitrite: NEGATIVE
PH: 5.5 (ref 5.0–8.0)
Protein, ur: 100 mg/dL — AB
Specific Gravity, Urine: 1.02 (ref 1.005–1.030)
Urobilinogen, UA: 0.2 mg/dL (ref 0.0–1.0)

## 2014-01-10 LAB — URINE MICROSCOPIC-ADD ON

## 2014-01-10 LAB — BASIC METABOLIC PANEL
Anion gap: 17 — ABNORMAL HIGH (ref 5–15)
BUN: 72 mg/dL — AB (ref 6–23)
CO2: 18 meq/L — AB (ref 19–32)
Calcium: 8.1 mg/dL — ABNORMAL LOW (ref 8.4–10.5)
Chloride: 100 mEq/L (ref 96–112)
Creatinine, Ser: 5.12 mg/dL — ABNORMAL HIGH (ref 0.50–1.35)
GFR calc Af Amer: 12 mL/min — ABNORMAL LOW (ref >90)
GFR, EST NON AFRICAN AMERICAN: 11 mL/min — AB (ref >90)
GLUCOSE: 120 mg/dL — AB (ref 70–99)
POTASSIUM: 5 meq/L (ref 3.7–5.3)
SODIUM: 135 meq/L — AB (ref 137–147)

## 2014-01-10 LAB — RAPID STREP SCREEN (MED CTR MEBANE ONLY): Streptococcus, Group A Screen (Direct): NEGATIVE

## 2014-01-10 LAB — LACTIC ACID, PLASMA: Lactic Acid, Venous: 1.2 mmol/L (ref 0.5–2.2)

## 2014-01-10 NOTE — ED Notes (Signed)
Patient states he was seen by PCP on Monday of this week and was started on antibiotics and steroids, but doesn't know why. Patient states he has had a cough and right rib pain. Patient is A&OX4, but c/o general body weakness.

## 2014-01-10 NOTE — ED Notes (Signed)
Patient verbalizes understanding of discharge instructions, home care, and follow up care. Patient ambulatory out of department at this time 

## 2014-01-10 NOTE — ED Notes (Signed)
Not feeling well for several days, states started on z-pack and steroids but states he does not know what for

## 2014-01-10 NOTE — ED Provider Notes (Signed)
CSN: [id number]     Arrival date & time 01/10/14  1959 History  This chart was scribed for Tanna Furry, MD by Randa Evens, ED Scribe. This patient was seen in room APA01/APA01 and the patient's care was started at 8:14 PM.      Chief Complaint  Patient presents with  . Fever   HPI HPI Comments: [name] is a 68 y.o. male who presents to the Emergency Department complaining of sore throat onset 6 days prior. He states he has associated cough fever, back pain, and sneezing. He states he went to his PCP 3 days ago. He states that he received a  steroid shot and prescribed a Z- pack that provided no relief. He denies SOB, nausea, vomiting, ear pain, dysuria, or rash. He states he has a Hx of multiple myeloma. He states he is currently in remission. He states he takes a chemo 7 chemo pills over the course of 2 weeks. He states that he alternates taking the pill every other day.  Remission for myeloma. States he takes a chemo pill. He states that he takes 7 pills alternating every other day. He states that recently one these pills caused him to vomit after taking it. He states he takes aranesp every 21 days for his anemia.     Past Medical History  Diagnosis Date  . Hypertension   . Ruptured cervical disc   . Fall resulting in striking against other object     paralyzed  . Multiple myeloma   . 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 Multiple myeloma 01/26/2011  . Unspecified vitamin D deficiency 03/06/2013  . Anemia of chronic disease 12/30/2013  . Chronic renal disease, stage 5, glomerular filtration rate less than or equal to 15 mL/min/1.73 square meter 12/30/2013   Past Surgical History  Procedure Laterality Date  . Inguinal hernia repair    . Anterior cervical decomp/discectomy fusion    . Bone marrow aspirate and biopsy wiith lumbar puncture    . Melanoma excision      rt. breast  . Cervical spine surgery     Family History  Problem Relation Age  of Onset  . Cancer Sister   . Cancer Brother    History  Substance Use Topics  . Smoking status: Never Smoker   . Smokeless tobacco: Never Used  . Alcohol Use: No    Review of Systems  Constitutional: Positive for fever. Negative for chills, diaphoresis, appetite change and fatigue.  HENT: Positive for sneezing and sore throat. Negative for ear pain, mouth sores and trouble swallowing.   Eyes: Negative for visual disturbance.  Respiratory: Positive for cough. Negative for chest tightness, shortness of breath and wheezing.   Cardiovascular: Negative for chest pain.  Gastrointestinal: Negative for nausea, vomiting, abdominal pain, diarrhea and abdominal distention.  Endocrine: Negative for polydipsia, polyphagia and polyuria.  Genitourinary: Negative for dysuria, frequency and hematuria.  Musculoskeletal: Positive for back pain. Negative for gait problem.  Skin: Negative for color change, pallor and rash.  Neurological: Negative for dizziness, syncope, light-headedness and headaches.  Hematological: Does not bruise/bleed easily.  Psychiatric/Behavioral: Negative for behavioral problems and confusion.    Allergies  Pamidronate  Home Medications   Prior to Admission medications   Medication Sig Start Date End Date Taking? Authorizing Provider  azithromycin (ZITHROMAX Z-PAK) 250 MG tablet Take 250-500 mg by mouth daily. Started 01/08/14   Yes Historical Provider, MD  Cholecalciferol (VITAMIN D-3) 1000 UNITS  CAPS Take 1 capsule by mouth daily.   Yes Historical Provider, MD  fish oil-omega-3 fatty acids 1000 MG capsule Take 2 g by mouth daily.   Yes Historical Provider, MD  metoprolol succinate (TOPROL XL) 25 MG 24 hr tablet Take 25 mg by mouth 2 (two) times daily. 12/18/13  Yes Historical Provider, MD  NIFEdipine (PROCARDIA XL) 60 MG 24 hr tablet Take 60 mg by mouth daily. 12/21/13 12/21/14 Yes Historical Provider, MD  torsemide (DEMADEX) 20 MG tablet Take 40 mg by mouth daily as needed  (swelling).  09/07/13  Yes Historical Provider, MD  magnesium hydroxide (MILK OF MAGNESIA) 400 MG/5ML suspension Take 30 mLs by mouth daily as needed for mild constipation.    Historical Provider, MD  meclizine (ANTIVERT) 25 MG tablet Take 1 tablet (25 mg total) by mouth 3 (three) times daily as needed for dizziness. 12/02/13   Fredia Sorrow, MD  sodium polystyrene (KAYEXALATE) 15 GM/60ML suspension Take 15 g by mouth once a week. Sundays    Historical Provider, MD   Triage Vitals: BP 125/77  Pulse 76  Temp(Src) 102 F (38.9 C) (Oral)  Resp 20  Wt 247 lb (112.038 kg)  SpO2 95%  Physical Exam  Nursing note and vitals reviewed. Constitutional: He is oriented to person, place, and time. He appears well-developed and well-nourished. No distress.  HENT:  Head: Normocephalic.  Eyes: Conjunctivae are normal. Pupils are equal, round, and reactive to light. No scleral icterus.  Neck: Normal range of motion. Neck supple. No thyromegaly present.  Cardiovascular: Normal rate and regular rhythm.  Exam reveals no gallop and no friction rub.   No murmur heard. Pulmonary/Chest: Effort normal and breath sounds normal. No respiratory distress. He has no wheezes. He has no rales.  Bilateral slightly diminished breath sounds, no crackles   Abdominal: Soft. Bowel sounds are normal. He exhibits no distension. There is no tenderness. There is no rebound.  Musculoskeletal: Normal range of motion. He exhibits tenderness.  Mid thoracic tenderness normal appearance no redness  Neurological: He is alert and oriented to person, place, and time.  Skin: Skin is warm and dry. No rash noted.  Psychiatric: He has a normal mood and affect. His behavior is normal.    ED Course  Procedures (including critical care time) DIAGNOSTIC STUDIES: Oxygen Saturation is 95% on RA, normal by my interpretation.    COORDINATION OF CARE: 8:20 PM-Discussed treatment plan which includes CXR, Strep Screen, CBC panel, CMP, UA) with  pt at bedside and pt agreed to plan.     Labs Review Labs Reviewed  CBC WITH DIFFERENTIAL - Abnormal; Notable for the following:    RBC 2.93 (*)    Hemoglobin 10.2 (*)    HCT 29.0 (*)    MCH 34.8 (*)    Platelets 48 (*)    All other components within normal limits  BASIC METABOLIC PANEL - Abnormal; Notable for the following:    Sodium 135 (*)    CO2 18 (*)    Glucose, Bld 120 (*)    BUN 72 (*)    Creatinine, Ser 5.12 (*)    Calcium 8.1 (*)    GFR calc non Af Amer 11 (*)    GFR calc Af Amer 12 (*)    Anion gap 17 (*)    All other components within normal limits  URINALYSIS, ROUTINE W REFLEX MICROSCOPIC - Abnormal; Notable for the following:    Hgb urine dipstick LARGE (*)    Protein, ur 100 (*)  All other components within normal limits  URINE MICROSCOPIC-ADD ON - Abnormal; Notable for the following:    Squamous Epithelial / LPF FEW (*)    Bacteria, UA FEW (*)    All other components within normal limits  RAPID STREP SCREEN  URINE CULTURE  CULTURE, GROUP A STREP  LACTIC ACID, PLASMA    Imaging Review Dg Chest 2 View  01/10/2014   CLINICAL DATA:  Fever and chills; history of multiple myeloma  EXAM: CHEST  2 VIEW  COMPARISON:  PA and lateral chest x-ray of December 02, 2013.  FINDINGS: The lungs are adequately inflated. There is no focal infiltrate. There is a small right pleural effusion layering posteriorly. The heart and pulmonary vascularity are normal. There is mild tortuosity of the descending thoracic aorta. There is cortical irregularity of the posterior lateral aspects of the right seventh and eighth ribs. There are prominent costovertebral junctions at T5, T6, and T7.  IMPRESSION: There is a new small right pleural effusion layering posteriorly. There is no evidence of CHF nor pneumonia.   Electronically Signed   By: David  Martinique   On: 01/10/2014 20:55     EKG Interpretation None      MDM   Final diagnoses:  Fever, unspecified fever cause     WBC 5.5. 67%  neutrophils. Not neutropenic. Chest x-ray showing only trace right pleural effusion. Renal insufficiency at his baseline. Within the last 6 months he has been as high as 5.1, as low as 3.8 a creatinine. He is 5.1 today. I think that he is appropriate for home treatment. Followup with his primary care physician. Return to the ER with acute changes.    I personally performed the services described in this documentation, which was scribed in my presence. The recorded information has been reviewed and is accurate.      Tanna Furry, MD 01/10/14 2300

## 2014-01-10 NOTE — Discharge Instructions (Signed)
No obvious cause for your fever is found in your evaluation tonight. Followup with her primary care physician if not improving. Continue the antibiotic prescribed by your physician. Tylenol as needed for fever.  Fever, Adult A fever is a temperature of 100.4 F (38 C) or above.  HOME CARE  Take fever medicine as told by your doctor. Do not  take aspirin for fever if you are younger than 66 years of age.  If you are given antibiotic medicine, take it as told. Finish the medicine even if you start to feel better.  Rest.  Drink enough fluids to keep your pee (urine) clear or pale yellow. Do not drink alcohol.  Take a bath or shower with room temperature water. Do not use ice water or alcohol sponge baths.  Wear lightweight, loose clothes. GET HELP RIGHT AWAY IF:   You are short of breath or have trouble breathing.  You are very weak.  You are dizzy or you pass out (faint).  You are very thirsty or are making little or no urine.  You have new pain.  You throw up (vomit) or have watery poop (diarrhea).  You keep throwing up or having watery poop for more than 1 to 2 days.  You have a stiff neck or light bothers your eyes.  You have a skin rash.  You have a fever or problems (symptoms) that last for more than 2 to 3 days.  You have a fever and your problems quickly get worse.  You keep throwing up the fluids you drink.  You do not feel better after 3 days.  You have new problems. MAKE SURE YOU:   Understand these instructions.  Will watch your condition.  Will get help right away if you are not doing well or get worse. Document Released: 02/11/2008 Document Revised: 07/27/2011 Document Reviewed: 03/05/2011 [facility] Patient Information 2015 [place], [place]. This information is not intended to replace advice given to you by your health care provider. Make sure you discuss any questions you have with your health care provider.

## 2014-01-12 ENCOUNTER — Encounter (HOSPITAL_BASED_OUTPATIENT_CLINIC_OR_DEPARTMENT_OTHER): Payer: PRIVATE HEALTH INSURANCE

## 2014-01-12 ENCOUNTER — Other Ambulatory Visit (HOSPITAL_COMMUNITY): Payer: PRIVATE HEALTH INSURANCE

## 2014-01-12 VITALS — BP 128/75 | HR 60 | Temp 98.0°F | Resp 20

## 2014-01-12 DIAGNOSIS — D631 Anemia in chronic kidney disease: Secondary | ICD-10-CM

## 2014-01-12 DIAGNOSIS — C9000 Multiple myeloma not having achieved remission: Secondary | ICD-10-CM

## 2014-01-12 DIAGNOSIS — N039 Chronic nephritic syndrome with unspecified morphologic changes: Secondary | ICD-10-CM

## 2014-01-12 DIAGNOSIS — N189 Chronic kidney disease, unspecified: Secondary | ICD-10-CM

## 2014-01-12 LAB — URINE CULTURE
COLONY COUNT: NO GROWTH
Culture: NO GROWTH

## 2014-01-12 MED ORDER — DARBEPOETIN ALFA-POLYSORBATE 500 MCG/ML IJ SOLN
500.0000 ug | Freq: Once | INTRAMUSCULAR | Status: AC
Start: 2014-01-12 — End: 2014-01-12
  Administered 2014-01-12: 500 ug via SUBCUTANEOUS

## 2014-01-12 MED ORDER — DARBEPOETIN ALFA-POLYSORBATE 500 MCG/ML IJ SOLN
INTRAMUSCULAR | Status: AC
  Filled 2014-01-12: qty 1

## 2014-01-12 NOTE — Progress Notes (Signed)
[name] presents today for injection per MD orders. Aranesp 500 mcg administered SQ in right Abdomen. Administration without incident. Patient tolerated well.

## 2014-01-13 LAB — CULTURE, GROUP A STREP

## 2014-01-19 ENCOUNTER — Other Ambulatory Visit (HOSPITAL_COMMUNITY): Payer: Self-pay | Admitting: Internal Medicine

## 2014-01-19 DIAGNOSIS — R079 Chest pain, unspecified: Secondary | ICD-10-CM

## 2014-01-19 DIAGNOSIS — IMO0002 Reserved for concepts with insufficient information to code with codable children: Secondary | ICD-10-CM

## 2014-01-23 ENCOUNTER — Ambulatory Visit (HOSPITAL_COMMUNITY)
Admission: RE | Admit: 2014-01-23 | Discharge: 2014-01-23 | Disposition: A | Discharge status: Home / Self Care | Payer: PRIVATE HEALTH INSURANCE | Source: Ambulatory Visit | Attending: Internal Medicine | Admitting: Internal Medicine

## 2014-01-23 DIAGNOSIS — J90 Pleural effusion, not elsewhere classified: Secondary | ICD-10-CM | POA: Insufficient documentation

## 2014-01-23 DIAGNOSIS — M546 Pain in thoracic spine: Secondary | ICD-10-CM | POA: Insufficient documentation

## 2014-01-23 DIAGNOSIS — R079 Chest pain, unspecified: Secondary | ICD-10-CM

## 2014-01-23 DIAGNOSIS — R071 Chest pain on breathing: Secondary | ICD-10-CM | POA: Insufficient documentation

## 2014-01-23 DIAGNOSIS — R05 Cough: Secondary | ICD-10-CM | POA: Insufficient documentation

## 2014-01-23 DIAGNOSIS — C9000 Multiple myeloma not having achieved remission: Secondary | ICD-10-CM | POA: Insufficient documentation

## 2014-01-23 DIAGNOSIS — M949 Disorder of cartilage, unspecified: Secondary | ICD-10-CM

## 2014-01-23 DIAGNOSIS — M899 Disorder of bone, unspecified: Secondary | ICD-10-CM | POA: Diagnosis not present

## 2014-01-23 DIAGNOSIS — Z981 Arthrodesis status: Secondary | ICD-10-CM | POA: Insufficient documentation

## 2014-01-23 DIAGNOSIS — R059 Cough, unspecified: Secondary | ICD-10-CM | POA: Diagnosis not present

## 2014-01-23 DIAGNOSIS — IMO0002 Reserved for concepts with insufficient information to code with codable children: Secondary | ICD-10-CM

## 2014-01-24 ENCOUNTER — Encounter (HOSPITAL_COMMUNITY): Payer: Self-pay | Admitting: Lab

## 2014-01-24 ENCOUNTER — Other Ambulatory Visit (HOSPITAL_COMMUNITY): Payer: Self-pay | Admitting: Oncology

## 2014-01-24 ENCOUNTER — Telehealth (HOSPITAL_COMMUNITY): Payer: Self-pay | Admitting: Oncology

## 2014-01-24 DIAGNOSIS — C9000 Multiple myeloma not having achieved remission: Secondary | ICD-10-CM

## 2014-01-24 MED ORDER — POMALIDOMIDE 1 MG PO CAPS: ORAL_CAPSULE | ORAL | Status: DC

## 2014-01-24 NOTE — Progress Notes (Signed)
Referral sent and faxed to [facility] for Dr Kathyrn Sheriff on 8/9

## 2014-01-24 NOTE — Telephone Encounter (Signed)
I personally reviewed and went over radiographic studies with the patient.  The results are noted within this dictation.    I have discussed the case with Dr. Marcell Anger.  He agrees that we should get a biopsy of the lesion, to prove that the lesion if from multiple myeloma and not another malignancy.  At the same time as the biopsy, it would be nice if a kyphoplasty or vertebralplasty can be performed since Mark[facility] may require radiation to this area and it may become a vunerable spot for compression.  Clent reports that the CT scan was performed because he has having increased back pain.  "I get the pain from time to time and the chiropractor works it out."  However, this time, the chiropractor was not able to make his back pain better and recommended he see his primary care physician.  This led to a CT of thoracic spine.  I have reviewed Spero's imaging studies and history.  I do not see mention of this lesion.  Of notes, PET scan in 2011 was negative for any skeletal lesions.  We have to assume this is new and treat it as such.  His serum labs are stable.  I would not change his systemic therapy at this time.  If this is shown to be a plasmacytoma, I would treat it as such with stabilization of the vertebral body as mentioned above, followed by spot radiation.  Patient and plan discussed with Dr. Farrel Gobble and Dr. Lucia Gaskins and they are in agreement with the aforementioned.   Albirda Shiel 01/24/2014

## 2014-01-25 ENCOUNTER — Other Ambulatory Visit (HOSPITAL_COMMUNITY): Payer: Self-pay | Admitting: Neurosurgery

## 2014-01-26 ENCOUNTER — Telehealth (HOSPITAL_COMMUNITY): Payer: Self-pay | Admitting: Emergency Medicine

## 2014-01-26 NOTE — Telephone Encounter (Signed)
Telephone call to [name] to notify him that i spoke with [name] himself to verifiy that he should continue taking his chemotherapy medication prior to his procedure next week

## 2014-01-29 ENCOUNTER — Encounter (HOSPITAL_COMMUNITY): Payer: Self-pay | Admitting: *Deleted

## 2014-01-29 NOTE — Discharge Instructions (Signed)
Wound Care Keep incision covered and dry .  Do not put any creams, lotions, or ointments on incision.  Activity Walk each and every day, increasing distance each day. No lifting greater than 5 lbs.  Avoid excessive neck motion. No driving for 2 weeks; may ride as a passenger locally.  Diet Resume your normal diet.  Return to Work Will be discussed at you follow up appointment. Call Your Doctor If Any of These Occur Redness, drainage, or swelling at the wound.  Temperature greater than 101 degrees. Severe pain not relieved by pain medication. Incision starts to come apart. Follow Up Appt Call today for appointment in 1-2 weeks ([phone]) or for problems.

## 2014-01-29 NOTE — Progress Notes (Signed)
[name] though that he will arrive at 1300 and surgery would be right then, he became upset when he found out that surgery is at 1525. I asked [name] to bring a list of current medications and name and dose of any medications he has taken in past month, patient complained about that, "I dont know why that matters"  I explained to patient why we need to know the name of medications and he calmed down.

## 2014-01-29 NOTE — Progress Notes (Signed)
01/29/14 1859  OBSTRUCTIVE SLEEP APNEA  Have you ever been diagnosed with sleep apnea through a sleep study? No  Do you snore loudly (loud enough to be heard through closed doors)?  0  Do you often feel tired, fatigued, or sleepy during the daytime? 1  Has anyone observed you stop breathing during your sleep? 0  Do you have, or are you being treated for high blood pressure? 1  BMI more than 35 kg/m2? 0  Age over 66 years old? 1  Neck circumference greater than 40 cm/16 inches? 1 (16.5)  Gender: 1  Obstructive Sleep Apnea Score 5  Score 4 or greater  Results sent to PCP

## 2014-01-30 ENCOUNTER — Encounter (HOSPITAL_COMMUNITY)
Admission: RE | Disposition: A | Discharge status: Home / Self Care | Payer: Self-pay | Source: Ambulatory Visit | Attending: Neurosurgery

## 2014-01-30 ENCOUNTER — Observation Stay (HOSPITAL_COMMUNITY): Payer: PRIVATE HEALTH INSURANCE

## 2014-01-30 ENCOUNTER — Inpatient Hospital Stay (HOSPITAL_COMMUNITY): Payer: PRIVATE HEALTH INSURANCE | Admitting: Anesthesiology

## 2014-01-30 ENCOUNTER — Observation Stay (HOSPITAL_COMMUNITY)
Admission: RE | Admit: 2014-01-30 | Discharge: 2014-01-30 | Disposition: A | Discharge status: Home / Self Care | Payer: PRIVATE HEALTH INSURANCE | Source: Ambulatory Visit | Attending: Neurosurgery | Admitting: Neurosurgery

## 2014-01-30 ENCOUNTER — Encounter (HOSPITAL_COMMUNITY): Payer: Self-pay | Admitting: *Deleted

## 2014-01-30 ENCOUNTER — Encounter (HOSPITAL_COMMUNITY): Payer: PRIVATE HEALTH INSURANCE | Admitting: Anesthesiology

## 2014-01-30 DIAGNOSIS — C9000 Multiple myeloma not having achieved remission: Secondary | ICD-10-CM | POA: Diagnosis not present

## 2014-01-30 DIAGNOSIS — N185 Chronic kidney disease, stage 5: Secondary | ICD-10-CM | POA: Insufficient documentation

## 2014-01-30 DIAGNOSIS — I120 Hypertensive chronic kidney disease with stage 5 chronic kidney disease or end stage renal disease: Secondary | ICD-10-CM | POA: Insufficient documentation

## 2014-01-30 DIAGNOSIS — M8448XA Pathological fracture, other site, initial encounter for fracture: Secondary | ICD-10-CM | POA: Diagnosis present

## 2014-01-30 DIAGNOSIS — E559 Vitamin D deficiency, unspecified: Secondary | ICD-10-CM | POA: Diagnosis not present

## 2014-01-30 DIAGNOSIS — M8440XA Pathological fracture, unspecified site, initial encounter for fracture: Secondary | ICD-10-CM | POA: Diagnosis present

## 2014-01-30 DIAGNOSIS — Z9481 Bone marrow transplant status: Secondary | ICD-10-CM | POA: Diagnosis not present

## 2014-01-30 HISTORY — DX: Constipation, unspecified: K59.00

## 2014-01-30 HISTORY — PX: KYPHOPLASTY: SHX5884

## 2014-01-30 LAB — CBC
HEMATOCRIT: 31.9 % — AB (ref 39.0–52.0)
HEMOGLOBIN: 10.8 g/dL — AB (ref 13.0–17.0)
MCH: 34.7 pg — ABNORMAL HIGH (ref 26.0–34.0)
MCHC: 33.9 g/dL (ref 30.0–36.0)
MCV: 102.6 fL — AB (ref 78.0–100.0)
Platelets: 42 10*3/uL — ABNORMAL LOW (ref 150–400)
RBC: 3.11 MIL/uL — ABNORMAL LOW (ref 4.22–5.81)
RDW: 15.3 % (ref 11.5–15.5)
WBC: 5.3 10*3/uL (ref 4.0–10.5)

## 2014-01-30 LAB — BASIC METABOLIC PANEL
ANION GAP: 18 — AB (ref 5–15)
BUN: 66 mg/dL — ABNORMAL HIGH (ref 6–23)
CHLORIDE: 106 meq/L (ref 96–112)
CO2: 15 meq/L — AB (ref 19–32)
CREATININE: 4.58 mg/dL — AB (ref 0.50–1.35)
Calcium: 8.2 mg/dL — ABNORMAL LOW (ref 8.4–10.5)
GFR calc Af Amer: 14 mL/min — ABNORMAL LOW (ref >90)
GFR calc non Af Amer: 12 mL/min — ABNORMAL LOW (ref >90)
GLUCOSE: 96 mg/dL (ref 70–99)
Potassium: 4.9 mEq/L (ref 3.7–5.3)
Sodium: 139 mEq/L (ref 137–147)

## 2014-01-30 LAB — SURGICAL PCR SCREEN
MRSA, PCR: NEGATIVE
Staphylococcus aureus: NEGATIVE

## 2014-01-30 SURGERY — KYPHOPLASTY
Anesthesia: General | Site: Back | Laterality: Bilateral

## 2014-01-30 MED ORDER — HYDROMORPHONE HCL PF 1 MG/ML IJ SOLN: 0.2500 mg | INTRAMUSCULAR | Status: DC | PRN

## 2014-01-30 MED ORDER — SODIUM CHLORIDE 0.9 % IR SOLN
Status: DC | PRN
  Administered 2014-01-30: 1000 mL

## 2014-01-30 MED ORDER — LIDOCAINE HCL 4 % MT SOLN
OROMUCOSAL | Status: DC | PRN
  Administered 2014-01-30: 4 mL via TOPICAL

## 2014-01-30 MED ORDER — PROPOFOL 10 MG/ML IV BOLUS
INTRAVENOUS | Status: DC | PRN
  Administered 2014-01-30: 120 mg via INTRAVENOUS

## 2014-01-30 MED ORDER — LIDOCAINE HCL (CARDIAC) 20 MG/ML IV SOLN
INTRAVENOUS | Status: DC | PRN
  Administered 2014-01-30: 50 mg via INTRAVENOUS

## 2014-01-30 MED ORDER — LIDOCAINE-EPINEPHRINE 1 %-1:100000 IJ SOLN
INTRAMUSCULAR | Status: DC | PRN
  Administered 2014-01-30: 2 mL

## 2014-01-30 MED ORDER — NEOSTIGMINE METHYLSULFATE 10 MG/10ML IV SOLN
INTRAVENOUS | Status: AC
  Filled 2014-01-30: qty 1

## 2014-01-30 MED ORDER — DEXAMETHASONE SODIUM PHOSPHATE 4 MG/ML IJ SOLN
INTRAMUSCULAR | Status: AC
  Filled 2014-01-30: qty 1

## 2014-01-30 MED ORDER — ONDANSETRON HCL 4 MG/2ML IJ SOLN
INTRAMUSCULAR | Status: DC | PRN
  Administered 2014-01-30: 4 mg via INTRAVENOUS

## 2014-01-30 MED ORDER — GLYCOPYRROLATE 0.2 MG/ML IJ SOLN
INTRAMUSCULAR | Status: DC | PRN
  Administered 2014-01-30: .8 mg via INTRAVENOUS

## 2014-01-30 MED ORDER — ROCURONIUM BROMIDE 100 MG/10ML IV SOLN
INTRAVENOUS | Status: DC | PRN
  Administered 2014-01-30: 30 mg via INTRAVENOUS

## 2014-01-30 MED ORDER — MIDAZOLAM HCL 2 MG/2ML IJ SOLN
INTRAMUSCULAR | Status: AC
  Filled 2014-01-30: qty 2

## 2014-01-30 MED ORDER — LIDOCAINE HCL (CARDIAC) 20 MG/ML IV SOLN
INTRAVENOUS | Status: AC
  Filled 2014-01-30: qty 10

## 2014-01-30 MED ORDER — IOHEXOL 300 MG/ML  SOLN
INTRAMUSCULAR | Status: DC | PRN
  Administered 2014-01-30: 50 mL

## 2014-01-30 MED ORDER — EPHEDRINE SULFATE 50 MG/ML IJ SOLN
INTRAMUSCULAR | Status: AC
  Filled 2014-01-30: qty 1

## 2014-01-30 MED ORDER — DEXAMETHASONE SODIUM PHOSPHATE 4 MG/ML IJ SOLN
INTRAMUSCULAR | Status: DC | PRN
  Administered 2014-01-30: 4 mg via INTRAVENOUS

## 2014-01-30 MED ORDER — ARTIFICIAL TEARS OP OINT
TOPICAL_OINTMENT | OPHTHALMIC | Status: DC | PRN
  Administered 2014-01-30: 1 via OPHTHALMIC

## 2014-01-30 MED ORDER — FENTANYL CITRATE 0.05 MG/ML IJ SOLN
INTRAMUSCULAR | Status: AC
  Filled 2014-01-30: qty 5

## 2014-01-30 MED ORDER — MIDAZOLAM HCL 5 MG/5ML IJ SOLN
INTRAMUSCULAR | Status: DC | PRN
  Administered 2014-01-30 (×2): 1 mg via INTRAVENOUS

## 2014-01-30 MED ORDER — PROPOFOL 10 MG/ML IV BOLUS
INTRAVENOUS | Status: AC
  Filled 2014-01-30: qty 20

## 2014-01-30 MED ORDER — FENTANYL CITRATE 0.05 MG/ML IJ SOLN
INTRAMUSCULAR | Status: DC | PRN
  Administered 2014-01-30 (×2): 50 ug via INTRAVENOUS

## 2014-01-30 MED ORDER — GLYCOPYRROLATE 0.2 MG/ML IJ SOLN
INTRAMUSCULAR | Status: AC
  Filled 2014-01-30: qty 1

## 2014-01-30 MED ORDER — GLYCOPYRROLATE 0.2 MG/ML IJ SOLN
INTRAMUSCULAR | Status: AC
  Filled 2014-01-30: qty 3

## 2014-01-30 MED ORDER — MUPIROCIN 2 % EX OINT
TOPICAL_OINTMENT | CUTANEOUS | Status: AC
  Administered 2014-01-30: 1 via TOPICAL
  Filled 2014-01-30: qty 22

## 2014-01-30 MED ORDER — BUPIVACAINE HCL (PF) 0.5 % IJ SOLN
INTRAMUSCULAR | Status: DC | PRN
  Administered 2014-01-30: 2 mL

## 2014-01-30 MED ORDER — SODIUM CHLORIDE 0.9 % IJ SOLN
INTRAMUSCULAR | Status: AC
  Filled 2014-01-30: qty 10

## 2014-01-30 MED ORDER — ARTIFICIAL TEARS OP OINT
TOPICAL_OINTMENT | OPHTHALMIC | Status: AC
Start: 2014-01-30 — End: 2014-01-30
  Filled 2014-01-30: qty 3.5

## 2014-01-30 MED ORDER — NEOSTIGMINE METHYLSULFATE 10 MG/10ML IV SOLN
INTRAVENOUS | Status: DC | PRN
  Administered 2014-01-30: 5 mg via INTRAVENOUS

## 2014-01-30 MED ORDER — EPHEDRINE SULFATE 50 MG/ML IJ SOLN
INTRAMUSCULAR | Status: DC | PRN
  Administered 2014-01-30: 5 mg via INTRAVENOUS
  Administered 2014-01-30: 10 mg via INTRAVENOUS
  Administered 2014-01-30: 5 mg via INTRAVENOUS

## 2014-01-30 MED ORDER — SODIUM CHLORIDE 0.9 % IV SOLN
INTRAVENOUS | Status: DC
  Administered 2014-01-30 (×2): via INTRAVENOUS

## 2014-01-30 MED ORDER — MUPIROCIN 2 % EX OINT
1.0000 "application " | TOPICAL_OINTMENT | Freq: Once | CUTANEOUS | Status: AC
  Administered 2014-01-30: 1 via TOPICAL

## 2014-01-30 MED ORDER — HYDROCODONE-ACETAMINOPHEN 5-325 MG PO TABS: 1.0000 | ORAL_TABLET | Freq: Every day | ORAL | Status: DC | PRN

## 2014-01-30 MED ORDER — CEFAZOLIN SODIUM-DEXTROSE 2-3 GM-% IV SOLR
INTRAVENOUS | Status: AC
  Administered 2014-01-30: 2 g via INTRAVENOUS
  Filled 2014-01-30: qty 50

## 2014-01-30 SURGICAL SUPPLY — 48 items
ADH SKN CLS APL DERMABOND .7 (GAUZE/BANDAGES/DRESSINGS)
ADH SKN CLS LQ APL DERMABOND (GAUZE/BANDAGES/DRESSINGS) ×1
BLADE SURG 15 STRL LF DISP TIS (BLADE) ×1 IMPLANT
BLADE SURG 15 STRL SS (BLADE) ×3
BLADE SURG ROTATE 9660 (MISCELLANEOUS) IMPLANT
CEMENT KYPHON C01A KIT/MIXER (Cement) ×2 IMPLANT
CONT SPEC 4OZ CLIKSEAL STRL BL (MISCELLANEOUS) ×6 IMPLANT
DERMABOND ADHESIVE PROPEN (GAUZE/BANDAGES/DRESSINGS) ×2
DERMABOND ADVANCED (GAUZE/BANDAGES/DRESSINGS)
DERMABOND ADVANCED .7 DNX12 (GAUZE/BANDAGES/DRESSINGS) ×1 IMPLANT
DERMABOND ADVANCED .7 DNX6 (GAUZE/BANDAGES/DRESSINGS) IMPLANT
DEVICE BIOPSY BONE KYPHX (INSTRUMENTS) ×2 IMPLANT
DRAPE C-ARM 42X72 X-RAY (DRAPES) ×3 IMPLANT
DRAPE LAPAROTOMY 100X72X124 (DRAPES) ×3 IMPLANT
DRAPE PROXIMA HALF (DRAPES) ×3 IMPLANT
DRAPE SURG 17X23 STRL (DRAPES) ×1 IMPLANT
DRSG TELFA 3X8 NADH (GAUZE/BANDAGES/DRESSINGS) ×3 IMPLANT
DURAPREP 26ML APPLICATOR (WOUND CARE) ×3 IMPLANT
GAUZE SPONGE 4X4 16PLY XRAY LF (GAUZE/BANDAGES/DRESSINGS) ×3 IMPLANT
GLOVE BIOGEL PI IND STRL 7.0 (GLOVE) IMPLANT
GLOVE BIOGEL PI IND STRL 7.5 (GLOVE) ×1 IMPLANT
GLOVE BIOGEL PI INDICATOR 7.0 (GLOVE) ×2
GLOVE BIOGEL PI INDICATOR 7.5 (GLOVE) ×2
GLOVE ECLIPSE 7.0 STRL STRAW (GLOVE) ×3 IMPLANT
GLOVE EXAM NITRILE LRG STRL (GLOVE) IMPLANT
GLOVE EXAM NITRILE MD LF STRL (GLOVE) IMPLANT
GLOVE EXAM NITRILE XL STR (GLOVE) IMPLANT
GLOVE EXAM NITRILE XS STR PU (GLOVE) IMPLANT
GLOVE SURG SS PI 7.0 STRL IVOR (GLOVE) ×4 IMPLANT
GOWN STRL REUS W/ TWL LRG LVL3 (GOWN DISPOSABLE) ×2 IMPLANT
GOWN STRL REUS W/ TWL XL LVL3 (GOWN DISPOSABLE) IMPLANT
GOWN STRL REUS W/TWL 2XL LVL3 (GOWN DISPOSABLE) IMPLANT
GOWN STRL REUS W/TWL LRG LVL3 (GOWN DISPOSABLE) ×3
GOWN STRL REUS W/TWL XL LVL3 (GOWN DISPOSABLE) ×3
KIT BASIN OR (CUSTOM PROCEDURE TRAY) ×3 IMPLANT
KIT ROOM TURNOVER OR (KITS) ×3 IMPLANT
NDL HYPO 25X1 1.5 SAFETY (NEEDLE) ×1 IMPLANT
NEEDLE HYPO 25X1 1.5 SAFETY (NEEDLE) ×3 IMPLANT
NS IRRIG 1000ML POUR BTL (IV SOLUTION) ×3 IMPLANT
PACK SURGICAL SETUP 50X90 (CUSTOM PROCEDURE TRAY) ×3 IMPLANT
PAD ARMBOARD 7.5X6 YLW CONV (MISCELLANEOUS) ×9 IMPLANT
PAD DRESSING TELFA 3X8 NADH (GAUZE/BANDAGES/DRESSINGS) IMPLANT
SPECIMEN JAR SMALL (MISCELLANEOUS) IMPLANT
SUT VICRYL 3-0 RB1 18 ABS (SUTURE) ×3 IMPLANT
SYR CONTROL 10ML LL (SYRINGE) ×4 IMPLANT
TOWEL OR 17X24 6PK STRL BLUE (TOWEL DISPOSABLE) ×3 IMPLANT
TOWEL OR 17X26 10 PK STRL BLUE (TOWEL DISPOSABLE) ×3 IMPLANT
TRAY KYPHOPAK 20/3 ONESTEP 1ST (MISCELLANEOUS) ×2 IMPLANT

## 2014-01-30 NOTE — Op Note (Signed)
PREOP DIAGNOSIS: T11 pathologic fx   POSTOP DIAGNOSIS: Same  PROCEDURE: 1. Biopsy of right T11 vertebral body 2. T11 Kyphoplasty  SURGEON: Dr. Consuella Lose, MD  ASSISTANT: None  ANESTHESIA: GETA  EBL: Minimal  SPECIMENS: Right T11 vertebral body for permanent pathology  DRAINS: None  COMPLICATIONS: None immediate  CONDITION: Hemodynamically stable to PCAU  HISTORY: [name] is a 68 y.o. yo male who was initially seen in the outpatient clinic, referred by his oncologist with a history of multiple myeloma. The patient recently was experiencing chills chest pain and underwent CT scan of the thorax which demonstrated a lytic lesion in the T11 vertebral body, as well as multiple smaller lytic lesions elsewhere in the thoracic spine. He was also experiencing some right-sided back pain. With these findings, biopsy with kyphoplasty was indicated. The risks and benefits of the procedure were explained in detail to the patient. After all his questions were answered, informed consent was obtained.  PROCEDURE IN DETAIL: After informed consent was obtained and witnessed, the patient was brought to the operating room. After induction of anesthesia, the patient was positioned on the operative table in the prone position. All pressure points were meticulously padded. Fluoroscopy was used to mark out the projection of the T11 pedicles on the skin. Skin incision was then marked out and prepped and draped in the usual sterile fashion.  After time out was conducted, right-sided stab incision was made and Jamshidi needle was introduced. Under AP and lateral fluoroscopic guidance, Jamshidi needle was advanced into the posterior portion of the vertebral body, just distal to the pedicle.  a biopsy at this location was then taken, and a good core of bone as well as bone marrow aspirate was obtained. This was sent for permanent pathology. At this point, the balloon was introduced and inflated to  approximately 120 psi, totaling approximately 3.5 cc. Approximately 4.5cc of PMMA cement was injected  through the right-sided Jamshidi needle  under fluoroscopy, taking care to preserve the posterior cortex. The trocar was replaced,  and the Jamshidi needle removed.  Stab incision was then closed with 3-0 vicryl suture and standard skin glue. The patient was then transferred to the stretcher and taken to the PACU in stable hemodynamic condition.  At the end of the case all sponge, needle, and instrument counts were correct.

## 2014-01-30 NOTE — H&P (Signed)
CC:  Back pain  HPI: [name] is a 68 year old man seen for initial consultation in the office, as a referral from Dr. Barnet Glasgow. 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 He recently underwent CT scan of the chest because of back and right sided chest pain. This demonstrated lytic lesions, largest on the right at T11 suspicious for recurrence of the myeloma. He was therefore referred for biopsy and possible kyphoplasty of this area.   The patient states that he does continue to have pain in the mid part of his back, as well as off to the right side, near his shoulder blade. He does not have any difficulty walking, changes in bladder function, or numbness or tingling in the legs. Over the past few weeks, he says that the pain has gotten better.   PMH: Past Medical History  Diagnosis Date  . Hypertension   . Ruptured cervical disc   . Fall resulting in striking against other object     paralyzed  . Multiple myeloma   . 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 Multiple myeloma 01/26/2011  . Unspecified vitamin D deficiency 03/06/2013  . Anemia of chronic disease 12/30/2013  . Chronic renal disease, stage 5, glomerular filtration rate less than or equal to 15 mL/min/1.73 square meter 12/30/2013  . Constipation     PSH: Past Surgical History  Procedure Laterality Date  . Inguinal hernia repair Bilateral   . Anterior cervical decomp/discectomy fusion    . Bone marrow aspirate and biopsy wiith lumbar puncture    . Melanoma excision      rt. breast  . Cervical spine surgery      SH: History  Substance Use Topics  . Smoking status: Never Smoker   . Smokeless tobacco: Never Used  . Alcohol Use: No    MEDS: Prior to Admission medications   Medication Sig Start Date End Date Taking? Authorizing Provider  HYDROcodone-acetaminophen (NORCO/VICODIN) 5-325 MG per  tablet Take 1 tablet by mouth daily as needed for severe pain.  01/18/14  Yes Historical Provider, MD  magnesium hydroxide (MILK OF MAGNESIA) 400 MG/5ML suspension Take 30 mLs by mouth daily as needed for mild constipation.   Yes Historical Provider, MD  metoprolol succinate (TOPROL XL) 25 MG 24 hr tablet Take 25 mg by mouth 2 (two) times daily. 12/18/13  Yes Historical Provider, MD  NIFEdipine (PROCARDIA XL) 60 MG 24 hr tablet Take 60 mg by mouth daily. 12/21/13 12/21/14 Yes Historical Provider, MD  pomalidomide (POMALYST) 1 MG capsule Take 1 tablet every other day 14 days and then 7 days off, then repeat 01/24/14  Yes Manon Hilding Kefalas, PA-C  pomalidomide (POMALYST) 1 MG capsule Take 1 mg by mouth See admin instructions. Current course started 01/29/14:  Take 1 capsule (1 mg) by mouth every other day for 14 days, hold for 7 days then repeat   Yes Historical Provider, MD  sodium polystyrene (KAYEXALATE) 15 GM/60ML suspension Take 15 g by mouth once a week. Sundays   Yes Historical Provider, MD  torsemide (DEMADEX) 20 MG tablet Take 40 mg by mouth daily as needed (swelling/fluid buildup).  09/07/13  Yes Historical Provider, MD  azithromycin (ZITHROMAX) 250 MG tablet Take 250-500 mg by mouth daily. Completed 01/27/14:  Take 2 capsules (500 mg) by mouth 1st day, then take 1 capsule (250 mg) daily on days 2-5  Historical Provider, MD  Cholecalciferol (VITAMIN D-3) 1000 UNITS CAPS Take 1,000 Units by mouth daily.     Historical Provider, MD  omega-3 acid ethyl esters (LOVAZA) 1 G capsule Take 1 g by mouth 2 (two) times daily.    Historical Provider, MD    ALLERGY: Allergies  Allergen Reactions  . Pamidronate Anaphylaxis    ROS: ROS  NEUROLOGIC EXAM: Awake, alert, oriented Memory and concentration grossly intact Speech fluent, appropriate CN grossly intact Motor exam: Upper Extremities Deltoid Bicep Tricep Grip  Right 5/5 5/5 5/5 5/5  Left 5/5 5/5 5/5 5/5   Lower Extremity IP Quad PF DF EHL  Right  5/5 5/5 5/5 5/5 5/5  Left 5/5 5/5 5/5 5/5 5/5   Sensation grossly intact to LT  IMGAING: CT of the thoracic spine was reviewed which demonstrates a lytic lesion on the right side of T11 extending into the right pedicle. There do appear to be other smaller lytic lesions in multiple other vertebrae of the thoracic spine.  IMPRESSION: 68 year old man with likely myeloma involving the T11 vertebral body, and right-sided back pain. Biopsy is requested for confirmation by his oncologist, and if this does turn out to be myeloma, he may be a candidate for spine radiation in the future. Given the size of this lesion, he does also appear to be at risk for pathologic fracture.  PLAN: - T11 biopsy and kyphoplasty - Likely home post-procedure

## 2014-01-30 NOTE — Anesthesia Preprocedure Evaluation (Addendum)
Anesthesia Evaluation  Patient identified by MRN, date of birth, ID band Patient awake    Reviewed: Allergy & Precautions, H&P , NPO status , Patient's Chart, lab work & pertinent test results, reviewed documented beta blocker date and time   Airway Mallampati: III TM Distance: >3 FB Neck ROM: Full    Dental no notable dental hx. (+) Partial Upper, Partial Lower, Dental Advisory Given   Pulmonary neg pulmonary ROS,  breath sounds clear to auscultation  Pulmonary exam normal       Cardiovascular hypertension, Pt. on medications and Pt. on home beta blockers Rhythm:Regular Rate:Normal     Neuro/Psych negative neurological ROS  negative psych ROS   GI/Hepatic negative GI ROS, Neg liver ROS,   Endo/Other  negative endocrine ROS  Renal/GU Renal disease  negative genitourinary   Musculoskeletal   Abdominal   Peds  Hematology negative hematology ROS (+) anemia , Multiple myeloma   Anesthesia Other Findings   Reproductive/Obstetrics negative OB ROS                          Anesthesia Physical Anesthesia Plan  ASA: III  Anesthesia Plan: General   Post-op Pain Management:    Induction: Intravenous  Airway Management Planned: Oral ETT  Additional Equipment:   Intra-op Plan:   Post-operative Plan: Extubation in OR  Informed Consent: I have reviewed the patients History and Physical, chart, labs and discussed the procedure including the risks, benefits and alternatives for the proposed anesthesia with the patient or authorized representative who has indicated his/her understanding and acceptance.   Dental advisory given  Plan Discussed with: CRNA  Anesthesia Plan Comments:         Anesthesia Quick Evaluation  

## 2014-01-30 NOTE — Anesthesia Postprocedure Evaluation (Signed)
  Anesthesia Post-op Note  Patient: [name]  Procedure(s) Performed: Procedure(s) with comments: Thoracic eleven Kyphoplasty (Bilateral) - Thoracic eleven Kyphoplasty  Patient Location: PACU  Anesthesia Type:General  Level of Consciousness: awake and alert   Airway and Oxygen Therapy: Patient Spontanous Breathing  Post-op Pain: none  Post-op Assessment: Post-op Vital signs reviewed, Patient's Cardiovascular Status Stable and Respiratory Function Stable  Post-op Vital Signs: Reviewed  Filed Vitals:   01/30/14 1619  BP: 149/83  Pulse: 75  Temp: 36.5 C  Resp:     Complications: No apparent anesthesia complications

## 2014-01-30 NOTE — Transfer of Care (Signed)
Immediate Anesthesia Transfer of Care Note  Patient: [name]  Procedure(s) Performed: Procedure(s) with comments: Thoracic eleven Kyphoplasty (Bilateral) - Thoracic eleven Kyphoplasty  Patient Location: PACU  Anesthesia Type:General  Level of Consciousness: sedated  Airway & Oxygen Therapy: Patient Spontanous Breathing and Patient connected to nasal cannula oxygen  Post-op Assessment: Report given to PACU RN, Post -op Vital signs reviewed and stable and Patient moving all extremities  Post vital signs: Reviewed and stable  Complications: No apparent anesthesia complications

## 2014-01-31 ENCOUNTER — Telehealth (HOSPITAL_COMMUNITY): Payer: Self-pay | Admitting: Oncology

## 2014-01-31 NOTE — Telephone Encounter (Signed)
Sakari called the clinic back at 1540 hours.   He reports that Dr. Nolon Rod office has referred him to Dr. Tressie Stalker, despite their reliance on the [facility] to work-up Koree's newly found lesion in the T-spine which included a referral to Dr. Kathyrn Sheriff (Neurosurgeon) for biopsy and kyphoplasty/vertebroplasty.    I have deferred the decision to transition the patient's care to Dr. Tressie Stalker to the patient, and Dyllon says "I ain't goin to him."  [facility] 01/31/2014

## 2014-01-31 NOTE — Telephone Encounter (Signed)
Marquis call reporting issues associated with his postop recovery.  "Did I have a tube down my throat?"  I have read the op note and it appears that he underwent general anesthesia.  Thwerefore, he was probably intubated.  I recommended tea with honey ("I can't take honey...its bad for my kidneys) and throat lozenges.    He also reports that he has been having some difficulties urinating today.  He reports that he had 1 episode or urine today, but it was inadequate by the patient's standards.  I suspect he had a Foley in place during his operation and this may be a reaction to that. He denies any burning or pain with urination. He denies any pelvic pain associated with urinary retention at this time. He reports that he did contact his surgeon and he is waiting for a return telephone call. I've asked the patient going back later this afternoon if he has not heard from the surgeon.  KEFALAS,THOMAS 01/31/2014

## 2014-02-01 ENCOUNTER — Encounter (HOSPITAL_COMMUNITY): Payer: Self-pay | Admitting: Neurosurgery

## 2014-02-02 ENCOUNTER — Telehealth (HOSPITAL_COMMUNITY): Payer: Self-pay | Admitting: Oncology

## 2014-02-02 NOTE — Telephone Encounter (Signed)
Patient called with complaints of constipation. He reports his last solid bowel movement was prior to his surgical intervention. He reports he did smoke a magnesia last night and he has not had abdominal yet this morning. I recommend you repeat milk of magnesia this morning.  His throat is getting better from intubation from surgery.  KEFALAS,THOMAS 02/02/2014

## 2014-02-08 ENCOUNTER — Encounter (HOSPITAL_COMMUNITY): Payer: PRIVATE HEALTH INSURANCE | Attending: Oncology

## 2014-02-08 ENCOUNTER — Encounter (HOSPITAL_BASED_OUTPATIENT_CLINIC_OR_DEPARTMENT_OTHER): Payer: PRIVATE HEALTH INSURANCE

## 2014-02-08 VITALS — BP 142/80 | HR 52 | Temp 97.7°F | Resp 18

## 2014-02-08 DIAGNOSIS — N183 Chronic kidney disease, stage 3 unspecified: Secondary | ICD-10-CM

## 2014-02-08 DIAGNOSIS — C9000 Multiple myeloma not having achieved remission: Secondary | ICD-10-CM | POA: Diagnosis present

## 2014-02-08 DIAGNOSIS — D631 Anemia in chronic kidney disease: Secondary | ICD-10-CM

## 2014-02-08 DIAGNOSIS — N039 Chronic nephritic syndrome with unspecified morphologic changes: Secondary | ICD-10-CM

## 2014-02-08 DIAGNOSIS — D638 Anemia in other chronic diseases classified elsewhere: Secondary | ICD-10-CM

## 2014-02-08 LAB — CBC WITH DIFFERENTIAL/PLATELET
Basophils Absolute: 0 10*3/uL (ref 0.0–0.1)
Basophils Relative: 1 % (ref 0–1)
Eosinophils Absolute: 0.1 10*3/uL (ref 0.0–0.7)
Eosinophils Relative: 2 % (ref 0–5)
HEMATOCRIT: 30.5 % — AB (ref 39.0–52.0)
HEMOGLOBIN: 10.4 g/dL — AB (ref 13.0–17.0)
LYMPHS PCT: 51 % — AB (ref 12–46)
Lymphs Abs: 2.2 10*3/uL (ref 0.7–4.0)
MCH: 34.4 pg — ABNORMAL HIGH (ref 26.0–34.0)
MCHC: 34.1 g/dL (ref 30.0–36.0)
MCV: 101 fL — ABNORMAL HIGH (ref 78.0–100.0)
MONO ABS: 0.3 10*3/uL (ref 0.1–1.0)
Monocytes Relative: 6 % (ref 3–12)
NEUTROS ABS: 1.7 10*3/uL (ref 1.7–7.7)
NEUTROS PCT: 40 % — AB (ref 43–77)
Platelets: 55 10*3/uL — ABNORMAL LOW (ref 150–400)
RBC: 3.02 MIL/uL — ABNORMAL LOW (ref 4.22–5.81)
RDW: 14.4 % (ref 11.5–15.5)
WBC: 4.3 10*3/uL (ref 4.0–10.5)

## 2014-02-08 LAB — COMPREHENSIVE METABOLIC PANEL
ALT: 11 U/L (ref 0–53)
AST: 20 U/L (ref 0–37)
Albumin: 3.3 g/dL — ABNORMAL LOW (ref 3.5–5.2)
Alkaline Phosphatase: 46 U/L (ref 39–117)
Anion gap: 14 (ref 5–15)
BUN: 74 mg/dL — ABNORMAL HIGH (ref 6–23)
CHLORIDE: 104 meq/L (ref 96–112)
CO2: 18 meq/L — AB (ref 19–32)
CREATININE: 4.98 mg/dL — AB (ref 0.50–1.35)
Calcium: 7.8 mg/dL — ABNORMAL LOW (ref 8.4–10.5)
GFR calc Af Amer: 13 mL/min — ABNORMAL LOW (ref >90)
GFR, EST NON AFRICAN AMERICAN: 11 mL/min — AB (ref >90)
Glucose, Bld: 97 mg/dL (ref 70–99)
Potassium: 5.2 mEq/L (ref 3.7–5.3)
Sodium: 136 mEq/L — ABNORMAL LOW (ref 137–147)
Total Bilirubin: 0.4 mg/dL (ref 0.3–1.2)
Total Protein: 6.8 g/dL (ref 6.0–8.3)

## 2014-02-08 LAB — C-REACTIVE PROTEIN: CRP: <0.5 mg/dL — ABNORMAL LOW (ref <0.60)

## 2014-02-08 LAB — SEDIMENTATION RATE: SED RATE: 39 mm/h — AB (ref 0–16)

## 2014-02-08 MED ORDER — DARBEPOETIN ALFA-POLYSORBATE 500 MCG/ML IJ SOLN
500.0000 ug | Freq: Once | INTRAMUSCULAR | Status: AC
  Administered 2014-02-08: 500 ug via SUBCUTANEOUS

## 2014-02-08 NOTE — Progress Notes (Signed)
[name] presents today for injection per the provider's orders.  Aranesp administration without incident; see MAR for injection details.  Patient tolerated procedure well and without incident.  No questions or complaints noted at this time. 

## 2014-02-08 NOTE — Progress Notes (Signed)
LABS FOR CBCD,CMP,ESR,B2M,CRPP,MM,KLLC

## 2014-02-09 ENCOUNTER — Telehealth (HOSPITAL_COMMUNITY): Payer: Self-pay | Admitting: Hematology and Oncology

## 2014-02-09 LAB — KAPPA/LAMBDA LIGHT CHAINS
Kappa free light chain: 7.31 mg/dL — ABNORMAL HIGH (ref 0.33–1.94)
Kappa, lambda light chain ratio: 2.12 — ABNORMAL HIGH (ref 0.26–1.65)
Lambda free light chains: 3.45 mg/dL — ABNORMAL HIGH (ref 0.57–2.63)

## 2014-02-12 LAB — MULTIPLE MYELOMA PANEL, SERUM
ALBUMIN ELP: 60.6 % (ref 55.8–66.1)
ALPHA-1-GLOBULIN: 5.6 % — AB (ref 2.9–4.9)
ALPHA-2-GLOBULIN: 10.8 % (ref 7.1–11.8)
BETA 2: 4.3 % (ref 3.2–6.5)
Beta Globulin: 6 % (ref 4.7–7.2)
Gamma Globulin: 12.7 % (ref 11.1–18.8)
IGA: 132 mg/dL (ref 68–379)
IGG (IMMUNOGLOBIN G), SERUM: 785 mg/dL (ref 650–1600)
IGM, SERUM: 93 mg/dL (ref 41–251)
M-Spike, %: NOT DETECTED g/dL
Total Protein: 6.4 g/dL (ref 6.0–8.3)

## 2014-02-12 LAB — BETA 2 MICROGLOBULIN, SERUM: Beta-2 Microglobulin: 15 mg/L — ABNORMAL HIGH (ref <=2.51)

## 2014-02-13 ENCOUNTER — Telehealth (HOSPITAL_COMMUNITY): Payer: Self-pay | Admitting: Oncology

## 2014-02-13 NOTE — Telephone Encounter (Signed)
I personally reviewed and went over pathology results with the patient.  Pathology results from biopsy of T11 were negative for plasmacytoma or other malignancy.  No need for further biopsy or intervention.    Patient and plan discussed with Dr. Farrel Gobble and he is in agreement with the aforementioned.    KEFALAS,THOMAS 02/13/2014

## 2014-02-16 ENCOUNTER — Other Ambulatory Visit (HOSPITAL_COMMUNITY): Payer: Self-pay

## 2014-02-16 DIAGNOSIS — N185 Chronic kidney disease, stage 5: Secondary | ICD-10-CM

## 2014-02-20 ENCOUNTER — Encounter (HOSPITAL_BASED_OUTPATIENT_CLINIC_OR_DEPARTMENT_OTHER): Payer: PRIVATE HEALTH INSURANCE

## 2014-02-20 ENCOUNTER — Telehealth (HOSPITAL_COMMUNITY): Payer: Self-pay | Admitting: Oncology

## 2014-02-20 ENCOUNTER — Encounter (HOSPITAL_COMMUNITY): Payer: PRIVATE HEALTH INSURANCE | Attending: Oncology

## 2014-02-20 DIAGNOSIS — C9000 Multiple myeloma not having achieved remission: Secondary | ICD-10-CM | POA: Diagnosis not present

## 2014-02-20 DIAGNOSIS — Z23 Encounter for immunization: Secondary | ICD-10-CM

## 2014-02-20 DIAGNOSIS — N185 Chronic kidney disease, stage 5: Secondary | ICD-10-CM

## 2014-02-20 LAB — RENAL FUNCTION PANEL
ANION GAP: 15 (ref 5–15)
Albumin: 3.6 g/dL (ref 3.5–5.2)
BUN: 68 mg/dL — AB (ref 6–23)
CO2: 21 mEq/L (ref 19–32)
Calcium: 9.4 mg/dL (ref 8.4–10.5)
Chloride: 101 mEq/L (ref 96–112)
Creatinine, Ser: 5.38 mg/dL — ABNORMAL HIGH (ref 0.50–1.35)
GFR calc Af Amer: 12 mL/min — ABNORMAL LOW (ref >90)
GFR calc non Af Amer: 10 mL/min — ABNORMAL LOW (ref >90)
GLUCOSE: 107 mg/dL — AB (ref 70–99)
Phosphorus: 6.6 mg/dL — ABNORMAL HIGH (ref 2.3–4.6)
Potassium: 5.4 mEq/L — ABNORMAL HIGH (ref 3.7–5.3)
Sodium: 137 mEq/L (ref 137–147)

## 2014-02-20 MED ORDER — INFLUENZA VAC SPLIT QUAD 0.5 ML IM SUSY
0.5000 mL | PREFILLED_SYRINGE | Freq: Once | INTRAMUSCULAR | Status: AC
  Administered 2014-02-20: 0.5 mL via INTRAMUSCULAR
  Filled 2014-02-20: qty 0.5

## 2014-02-20 NOTE — Telephone Encounter (Signed)
I personally reviewed and went over laboratory results with the patient.  The results are noted within this dictation.  He sees nephrologist tomorrow AM.  Robynn Pane 02/20/2014

## 2014-02-20 NOTE — Patient Instructions (Signed)
[facility] Discharge Instructions  RECOMMENDATIONS MADE BY THE CONSULTANT AND ANY TEST RESULTS WILL BE SENT TO YOUR REFERRING PHYSICIAN.  You were given your flu shot today.  Information sheet was provided. Please call for any questions or concerns.   Thank you for choosing [facility] to provide your oncology and hematology care.  To afford each patient quality time with our providers, please arrive at least 15 minutes before your scheduled appointment time.  With your help, our goal is to use those 15 minutes to complete the necessary work-up to ensure our physicians have the information they need to help with your evaluation and healthcare recommendations.    Effective January 1st, 2014, we ask that you re-schedule your appointment with our physicians should you arrive 10 or more minutes late for your appointment.  We strive to give you quality time with our providers, and arriving late affects you and other patients whose appointments are after yours.    Again, thank you for choosing [facility].  Our hope is that these requests will decrease the amount of time that you wait before being seen by our physicians.       _____________________________________________________________  Should you have questions after your visit to [facility], please contact our office at [phone] between the hours of 8:30 a.m. and 4:30 p.m.  Voicemails left after 4:30 p.m. will not be returned until the following business day.  For prescription refill requests, have your pharmacy contact our office with your prescription refill request.    _______________________________________________________________  We hope that we have given you very good care.  You may receive a patient satisfaction survey in the mail, please complete it and return it as soon as possible.  We value your  feedback!  _______________________________________________________________  Have you asked about our STAR program?  STAR stands for Survivorship Training and Rehabilitation, and this is a nationally recognized cancer care program that focuses on survivorship and rehabilitation.  Cancer and cancer treatments may cause problems, such as, pain, making you feel tired and keeping you from doing the things that you need or want to do. Cancer rehabilitation can help. Our goal is to reduce these troubling effects and help you have the best quality of life possible.  You may receive a survey from a nurse that asks questions about your current state of health.  Based on the survey results, all eligible patients will be referred to the [facility] program for an evaluation so we can better serve you!  A frequently asked questions sheet is available upon request.

## 2014-02-20 NOTE — Progress Notes (Signed)
LABS FOR RENAL

## 2014-02-20 NOTE — Progress Notes (Signed)
Patient was given flu shot to left deltoid. Pt tolerated well.

## 2014-02-23 ENCOUNTER — Telehealth (HOSPITAL_COMMUNITY): Payer: Self-pay | Admitting: Oncology

## 2014-02-23 NOTE — Telephone Encounter (Signed)
Patient saw nephrology.  He is recommended to have shunt placed for dialysis preparation.  Ancil reports that he was told he will need dialysis by 2016.  He wishes to have a second opinion.  "This is a big decision for me."  He said that the nephrologist expects [name] will be in the hospital in the next two months due to progressive renal disease needing dialysis.  Due to the gravity of this decision, [name] wants a second opinion and I will absolutely set that up for him.    Camillia Marcy 02/23/2014

## 2014-03-01 ENCOUNTER — Encounter (HOSPITAL_BASED_OUTPATIENT_CLINIC_OR_DEPARTMENT_OTHER): Payer: PRIVATE HEALTH INSURANCE

## 2014-03-01 ENCOUNTER — Encounter (HOSPITAL_COMMUNITY): Payer: PRIVATE HEALTH INSURANCE | Attending: Hematology and Oncology

## 2014-03-01 VITALS — BP 147/84 | HR 63 | Temp 98.6°F | Resp 16

## 2014-03-01 DIAGNOSIS — C9000 Multiple myeloma not having achieved remission: Secondary | ICD-10-CM

## 2014-03-01 DIAGNOSIS — D631 Anemia in chronic kidney disease: Secondary | ICD-10-CM

## 2014-03-01 DIAGNOSIS — N185 Chronic kidney disease, stage 5: Secondary | ICD-10-CM

## 2014-03-01 LAB — CBC
HEMATOCRIT: 30 % — AB (ref 39.0–52.0)
HEMOGLOBIN: 10.3 g/dL — AB (ref 13.0–17.0)
MCH: 34.8 pg — ABNORMAL HIGH (ref 26.0–34.0)
MCHC: 34.3 g/dL (ref 30.0–36.0)
MCV: 101.4 fL — AB (ref 78.0–100.0)
Platelets: 56 10*3/uL — ABNORMAL LOW (ref 150–400)
RBC: 2.96 MIL/uL — ABNORMAL LOW (ref 4.22–5.81)
RDW: 14.8 % (ref 11.5–15.5)
WBC: 5 10*3/uL (ref 4.0–10.5)

## 2014-03-01 MED ORDER — DARBEPOETIN ALFA-POLYSORBATE 500 MCG/ML IJ SOLN
INTRAMUSCULAR | Status: AC
  Filled 2014-03-01: qty 1

## 2014-03-01 MED ORDER — DARBEPOETIN ALFA-POLYSORBATE 500 MCG/ML IJ SOLN
500.0000 ug | Freq: Once | INTRAMUSCULAR | Status: AC
  Administered 2014-03-01: 500 ug via SUBCUTANEOUS

## 2014-03-01 NOTE — Progress Notes (Signed)
[name] presents today for injection per MD orders. Aranesp 500 mcg administered SQ in left Abdomen. Administration without incident. Patient tolerated well.

## 2014-03-01 NOTE — Progress Notes (Signed)
LABS FOR CBC 

## 2014-03-01 NOTE — Patient Instructions (Signed)
[facility] Discharge Instructions  RECOMMENDATIONS MADE BY THE CONSULTANT AND ANY TEST RESULTS WILL BE SENT TO YOUR REFERRING PHYSICIAN.  INSTRUCTIONS/FOLLOW-UP: Hemoglobin 10.3 today. Aranesp injection given. Continue lab work and injection every 3 weeks. Return to clinic as scheduled.  Thank you for choosing [facility] to provide your oncology and hematology care.  To afford each patient quality time with our providers, please arrive at least 15 minutes before your scheduled appointment time.  With your help, our goal is to use those 15 minutes to complete the necessary work-up to ensure our physicians have the information they need to help with your evaluation and healthcare recommendations.    Effective January 1st, 2014, we ask that you re-schedule your appointment with our physicians should you arrive 10 or more minutes late for your appointment.  We strive to give you quality time with our providers, and arriving late affects you and other patients whose appointments are after yours.    Again, thank you for choosing [facility].  Our hope is that these requests will decrease the amount of time that you wait before being seen by our physicians.       _____________________________________________________________  Should you have questions after your visit to [facility], please contact our office at [phone] between the hours of 8:30 a.m. and 4:30 p.m.  Voicemails left after 4:30 p.m. will not be returned until the following business day.  For prescription refill requests, have your pharmacy contact our office with your prescription refill request.    _______________________________________________________________  We hope that we have given you very good care.  You may receive a patient satisfaction survey in the mail, please complete it and return it as soon as possible.  We value your  feedback!  _______________________________________________________________  Have you asked about our STAR program?  STAR stands for Survivorship Training and Rehabilitation, and this is a nationally recognized cancer care program that focuses on survivorship and rehabilitation.  Cancer and cancer treatments may cause problems, such as, pain, making you feel tired and keeping you from doing the things that you need or want to do. Cancer rehabilitation can help. Our goal is to reduce these troubling effects and help you have the best quality of life possible.  You may receive a survey from a nurse that asks questions about your current state of health.  Based on the survey results, all eligible patients will be referred to the [facility] program for an evaluation so we can better serve you!  A frequently asked questions sheet is available upon request.

## 2014-03-08 ENCOUNTER — Telehealth (HOSPITAL_COMMUNITY): Payer: Self-pay | Admitting: Oncology

## 2014-03-08 DIAGNOSIS — C9000 Multiple myeloma not having achieved remission: Secondary | ICD-10-CM

## 2014-03-08 MED ORDER — POMALIDOMIDE 1 MG PO CAPS: ORAL_CAPSULE | ORAL | Status: DC

## 2014-03-08 NOTE — Telephone Encounter (Signed)
[name] has an appointment with [facility] in [place] tomorrow AM due to a recent cancellation.    I will refill his Pomalyst.  I have not received a refill request, but he is due for a refill.   KEFALAS,THOMAS 03/08/2014

## 2014-03-22 ENCOUNTER — Encounter (HOSPITAL_COMMUNITY): Payer: PRIVATE HEALTH INSURANCE | Attending: Hematology and Oncology

## 2014-03-22 ENCOUNTER — Encounter (HOSPITAL_COMMUNITY): Payer: PRIVATE HEALTH INSURANCE | Attending: Oncology

## 2014-03-22 DIAGNOSIS — D631 Anemia in chronic kidney disease: Secondary | ICD-10-CM

## 2014-03-22 DIAGNOSIS — N185 Chronic kidney disease, stage 5: Secondary | ICD-10-CM | POA: Diagnosis not present

## 2014-03-22 DIAGNOSIS — C9000 Multiple myeloma not having achieved remission: Secondary | ICD-10-CM | POA: Diagnosis not present

## 2014-03-22 LAB — CBC WITH DIFFERENTIAL/PLATELET
BASOS PCT: 0 % (ref 0–1)
Basophils Absolute: 0 10*3/uL (ref 0.0–0.1)
EOS ABS: 0.1 10*3/uL (ref 0.0–0.7)
Eosinophils Relative: 2 % (ref 0–5)
HCT: 33.3 % — ABNORMAL LOW (ref 39.0–52.0)
Hemoglobin: 11.3 g/dL — ABNORMAL LOW (ref 13.0–17.0)
Lymphocytes Relative: 56 % — ABNORMAL HIGH (ref 12–46)
Lymphs Abs: 2.6 10*3/uL (ref 0.7–4.0)
MCH: 34.1 pg — AB (ref 26.0–34.0)
MCHC: 33.9 g/dL (ref 30.0–36.0)
MCV: 100.6 fL — ABNORMAL HIGH (ref 78.0–100.0)
Monocytes Absolute: 0.2 10*3/uL (ref 0.1–1.0)
Monocytes Relative: 5 % (ref 3–12)
NEUTROS PCT: 37 % — AB (ref 43–77)
Neutro Abs: 1.7 10*3/uL (ref 1.7–7.7)
PLATELETS: 63 10*3/uL — AB (ref 150–400)
RBC: 3.31 MIL/uL — AB (ref 4.22–5.81)
RDW: 14.9 % (ref 11.5–15.5)
WBC: 4.6 10*3/uL (ref 4.0–10.5)

## 2014-03-22 LAB — COMPREHENSIVE METABOLIC PANEL
ALBUMIN: 3.9 g/dL (ref 3.5–5.2)
ALK PHOS: 46 U/L (ref 39–117)
ALT: 16 U/L (ref 0–53)
ANION GAP: 18 — AB (ref 5–15)
AST: 17 U/L (ref 0–37)
BUN: 90 mg/dL — ABNORMAL HIGH (ref 6–23)
CO2: 21 mEq/L (ref 19–32)
Calcium: 8.3 mg/dL — ABNORMAL LOW (ref 8.4–10.5)
Chloride: 102 mEq/L (ref 96–112)
Creatinine, Ser: 6.4 mg/dL — ABNORMAL HIGH (ref 0.50–1.35)
GFR calc Af Amer: 9 mL/min — ABNORMAL LOW (ref >90)
GFR calc non Af Amer: 8 mL/min — ABNORMAL LOW (ref >90)
Glucose, Bld: 104 mg/dL — ABNORMAL HIGH (ref 70–99)
POTASSIUM: 3.7 meq/L (ref 3.7–5.3)
SODIUM: 141 meq/L (ref 137–147)
TOTAL PROTEIN: 7.5 g/dL (ref 6.0–8.3)
Total Bilirubin: 0.4 mg/dL (ref 0.3–1.2)

## 2014-03-22 LAB — SEDIMENTATION RATE: Sed Rate: 30 mm/hr — ABNORMAL HIGH (ref 0–16)

## 2014-03-22 LAB — C-REACTIVE PROTEIN

## 2014-03-22 MED ORDER — DARBEPOETIN ALFA 500 MCG/ML IJ SOSY
500.0000 ug | PREFILLED_SYRINGE | Freq: Once | INTRAMUSCULAR | Status: AC
  Administered 2014-03-22: 500 ug via SUBCUTANEOUS
  Filled 2014-03-22: qty 1

## 2014-03-22 NOTE — Progress Notes (Signed)
[name] presents today for injection per MD orders. Aranesp 500 mcg administered SQ in left Abdomen. Administration without incident. Patient tolerated well.

## 2014-03-22 NOTE — Patient Instructions (Signed)
[facility] Discharge Instructions  RECOMMENDATIONS MADE BY THE CONSULTANT AND ANY TEST RESULTS WILL BE SENT TO YOUR REFERRING PHYSICIAN.  INSTRUCTIONS/FOLLOW-UP: Hemoglobin 11.3 today. Aranesp 500 mcg administered as ordered.  Return to clinic every 21 days as scheduled for lab work and injection. Report any issues/concerns to clinic as needed prior to appointments.  Thank you for choosing [facility] to provide your oncology and hematology care.  To afford each patient quality time with our providers, please arrive at least 15 minutes before your scheduled appointment time.  With your help, our goal is to use those 15 minutes to complete the necessary work-up to ensure our physicians have the information they need to help with your evaluation and healthcare recommendations.    Effective January 1st, 2014, we ask that you re-schedule your appointment with our physicians should you arrive 10 or more minutes late for your appointment.  We strive to give you quality time with our providers, and arriving late affects you and other patients whose appointments are after yours.    Again, thank you for choosing [facility].  Our hope is that these requests will decrease the amount of time that you wait before being seen by our physicians.       _____________________________________________________________  Should you have questions after your visit to [facility], please contact our office at [phone] between the hours of 8:30 a.m. and 4:30 p.m.  Voicemails left after 4:30 p.m. will not be returned until the following business day.  For prescription refill requests, have your pharmacy contact our office with your prescription refill request.    _______________________________________________________________  We hope that we have given you very good care.  You may receive a patient satisfaction survey in the mail, please complete it and  return it as soon as possible.  We value your feedback!  _______________________________________________________________  Have you asked about our STAR program?  STAR stands for Survivorship Training and Rehabilitation, and this is a nationally recognized cancer care program that focuses on survivorship and rehabilitation.  Cancer and cancer treatments may cause problems, such as, pain, making you feel tired and keeping you from doing the things that you need or want to do. Cancer rehabilitation can help. Our goal is to reduce these troubling effects and help you have the best quality of life possible.  You may receive a survey from a nurse that asks questions about your current state of health.  Based on the survey results, all eligible patients will be referred to the [facility] program for an evaluation so we can better serve you!  A frequently asked questions sheet is available upon request.

## 2014-03-22 NOTE — Progress Notes (Signed)
LABS FOR CBCD,CMP,ESR,B2M,CRP,MM,KLLC

## 2014-03-23 LAB — KAPPA/LAMBDA LIGHT CHAINS
KAPPA FREE LGHT CHN: 9.61 mg/dL — AB (ref 0.33–1.94)
Kappa, lambda light chain ratio: 1.93 — ABNORMAL HIGH (ref 0.26–1.65)
Lambda free light chains: 4.99 mg/dL — ABNORMAL HIGH (ref 0.57–2.63)

## 2014-03-26 LAB — MULTIPLE MYELOMA PANEL, SERUM
ALPHA-1-GLOBULIN: 5.1 % — AB (ref 2.9–4.9)
ALPHA-2-GLOBULIN: 9.8 % (ref 7.1–11.8)
Albumin ELP: 61.1 % (ref 55.8–66.1)
Beta 2: 4.2 % (ref 3.2–6.5)
Beta Globulin: 5.9 % (ref 4.7–7.2)
Gamma Globulin: 13.9 % (ref 11.1–18.8)
IGG (IMMUNOGLOBIN G), SERUM: 933 mg/dL (ref 650–1600)
IgA: 127 mg/dL (ref 68–379)
IgM, Serum: 165 mg/dL (ref 41–251)
M-Spike, %: NOT DETECTED g/dL
SPE Interp.: 1
Total Protein: 6.6 g/dL (ref 6.0–8.3)

## 2014-03-26 LAB — BETA 2 MICROGLOBULIN, SERUM: Beta-2 Microglobulin: 19.2 mg/L — ABNORMAL HIGH (ref <=2.51)

## 2014-03-29 ENCOUNTER — Other Ambulatory Visit (HOSPITAL_COMMUNITY): Payer: Self-pay | Admitting: Oncology

## 2014-03-29 DIAGNOSIS — C9000 Multiple myeloma not having achieved remission: Secondary | ICD-10-CM

## 2014-03-29 MED ORDER — POMALIDOMIDE 1 MG PO CAPS: ORAL_CAPSULE | ORAL | Status: DC

## 2014-04-01 NOTE — Progress Notes (Signed)
[name], [place] [address]  Multiple myeloma  Anemia of chronic disease  Chronic renal disease, stage 5, glomerular filtration rate less than or equal to 15 mL/min/1.73 square meter  Constipation, unspecified constipation type  CURRENT THERAPY: Pomalyst every other day x 14 days followed by 7 day respite, then repeated. Dexamethasone D/C'd due to patient reported intolerance. Anaphylactic reaction to bisphosphonate therapy, D/C'd. Aranesp 500 mcg every 21 days for Hgb less than or equal to 11 g/dL.  INTERVAL HISTORY: [name] 68 y.o. 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 Marcell [name] at [facility].  I personally reviewed and went over laboratory results with the patient.  The results are noted within this dictation.  I personally reviewed and went over radiographic studies with the patient.  The results are noted within this dictation.    I personally reviewed and went over pathology results with the patient.  [name] has been followed by nephrology due to his chronic renal disease and it is recommended to him to undergo planning for stent placement in preparation for potential future hemodialysis.  This has been recommended by his primary nephrologist and by a second nephrologist when he went to [facility] for a second opinion regarding this major life alteration.  [name] is undergoing discussion for shunt placement with specialists coming up in December 2015.  He knows that Dr. Marcell [name] is retiring soon and he is interested in seeing him before he retires.  My understanding is that Dr. Marcell [name] is retiring the end of 2015.  [name] would like to see him before he leaves and maybe meet Dr. Marjory [name] colleague who will be consuming his clinic patients.  From an [facility] standpoint, we would be interested in learning  what Dr. Marcell [name] may recommend if/when [name] fails Pomalyst with the current landscape of multiple myeloma drugs while keeping in mind the potential for [name] to need hemodialysis in the future.  Is there a role for Daratumumab?  Hopefully we can learn that from [facility] upcoming visit.  Hematologically, he is tolerating Pomalyst and his labs are stable with multiple myeloma labs except for a mild elevation of kappa and lamba light chains that could be reactive from his renal disease.  His calcium is stable and WNL.  His hemoglobin is excellent with ESA therapy.  Recent kyphoplasty and removal of new vertebral lesion was not identified as a plasmcytoma.  Overall, from a multiple myeloma standpoint, he is very stable.  He does note some constipation and requests recommendations on medications for his BM regimen.  I recommended Senokot-S 1-2 tablets one to two times daily.  This will not interfere with his renal function.  Hematologically, he denies any complaints and ROS questioning is negative.   Past Medical History  Diagnosis Date  . Hypertension   . Ruptured cervical disc   . Fall resulting in striking against other object     paralyzed  . Multiple myeloma   . 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 Multiple myeloma 01/26/2011  . Unspecified vitamin D deficiency 03/06/2013  . Anemia of chronic disease 12/30/2013  . Chronic renal disease, stage 5, glomerular filtration rate less than or equal to 15 mL/min/1.73 square meter 12/30/2013  . Constipation     has ESSENTIAL HYPERTENSION, BENIGN; BRADYCARDIA; Multiple myeloma; Intravenous pamidronate causing  adverse effect in therapeutic use; Unspecified vitamin D deficiency; Lymphedema of lower extremity; Anemia of chronic disease; Chronic renal disease, stage 5, glomerular filtration rate less than or equal to 15 mL/min/1.73 square meter; and Pathologic fracture on his problem list.     is allergic to pamidronate.  Mr.  [name] had no medications administered during this visit.  Past Surgical History  Procedure Laterality Date  . Inguinal hernia repair Bilateral   . Anterior cervical decomp/discectomy fusion    . Bone marrow aspirate and biopsy wiith lumbar puncture    . Melanoma excision      rt. breast  . Cervical spine surgery    . Kyphoplasty Bilateral 01/30/2014    Procedure: Thoracic eleven Kyphoplasty;  Surgeon: Consuella Lose, MD;  Location: [facility];  Service: Neurosurgery;  Laterality: Bilateral;  Thoracic eleven Kyphoplasty    Denies any headaches, dizziness, double vision, fevers, chills, night sweats, nausea, vomiting, diarrhea, constipation, chest pain, heart palpitations, shortness of breath, blood in stool, black tarry stool, urinary pain, urinary burning, urinary frequency, hematuria.   PHYSICAL EXAMINATION  ECOG PERFORMANCE STATUS: 1 - Symptomatic but completely ambulatory  Filed Vitals:   04/04/14 1437  BP: 138/81  Pulse: 57  Temp: 98.3 F (36.8 C)  Resp: 18    GENERAL:alert, no distress, well nourished, well developed, comfortable, cooperative, obese and smiling SKIN: skin color, texture, turgor are normal, no rashes or significant lesions HEAD: Normocephalic, No masses, lesions, tenderness or abnormalities EYES: normal, PERRLA, EOMI, Conjunctiva are pink and non-injected EARS: External ears normal OROPHARYNX:lips, buccal mucosa, and tongue normal and mucous membranes are moist  NECK: supple, no adenopathy, thyroid normal size, non-tender, without nodularity, no stridor, non-tender, trachea midline LYMPH:  no palpable lymphadenopathy BREAST:not examined LUNGS: clear to auscultation  HEART: regular rate & rhythm, no murmurs, no gallops, S1 normal and S2 normal ABDOMEN:abdomen soft, non-tender, obese, normal bowel sounds and no masses or organomegaly BACK: Back symmetric, no curvature. EXTREMITIES:less then 2 second capillary refill, no joint deformities, effusion,  or inflammation, no skin discoloration, no clubbing, no cyanosis, positive findings:  edema Bilateral ankle edema, trace to 1+ pitting  NEURO: alert & oriented x 3 with fluent speech, no focal motor/sensory deficits, gait normal   LABORATORY DATA: CBC    Component Value Date/Time   WBC 4.6 03/22/2014 0904   RBC 3.31* 03/22/2014 0904   HGB 11.3* 03/22/2014 0904   HCT 33.3* 03/22/2014 0904   PLT 63* 03/22/2014 0904   MCV 100.6* 03/22/2014 0904   MCH 34.1* 03/22/2014 0904   MCHC 33.9 03/22/2014 0904   RDW 14.9 03/22/2014 0904   LYMPHSABS 2.6 03/22/2014 0904   MONOABS 0.2 03/22/2014 0904   EOSABS 0.1 03/22/2014 0904   BASOSABS 0.0 03/22/2014 0904      Chemistry      Component Value Date/Time   NA 141 03/22/2014 0904   K 3.7 03/22/2014 0904   CL 102 03/22/2014 0904   CO2 21 03/22/2014 0904   BUN 90* 03/22/2014 0904   CREATININE 6.40* 03/22/2014 0904   CREATININE 43.09* 01/26/2012 1052      Component Value Date/Time   CALCIUM 8.3* 03/22/2014 0904   ALKPHOS 46 03/22/2014 0904   AST 17 03/22/2014 0904   ALT 16 03/22/2014 0904   BILITOT 0.4 03/22/2014 0904      Results for [name], [name] (MRN [id number]) as of 04/01/2014 12:14  Ref. Range 03/22/2014 09:04 03/22/2014 09:04  Beta-2 Microglobulin Latest Range: <=2.51 mg/L 19.20 (H)  CRP Latest Range: <0.60 mg/dL <0.5 (L)   Albumin ELP Latest Range: 55.8-66.1 %  61.1  Alpha-1-Globulin Latest Range: 2.9-4.9 %  5.1 (H)  Alpha-2-Globulin Latest Range: 7.1-11.8 %  9.8  Beta Globulin Latest Range: 4.7-7.2 %  5.9  Beta 2 Latest Range: 3.2-6.5 %  4.2  Gamma Globulin Latest Range: 11.1-18.8 %  13.9  M-SPIKE, % No range found  NOT DETECTED  SPE Interp. No range found  1 region.  Comment No range found  (NOTE)  IgG (Immunoglobin G), Serum Latest Range: 515-130-5833 mg/dL  933  IgA Latest Range: 68-379 mg/dL  127  IgM, Serum Latest Range: 41-251 mg/dL  165  Kappa free light chain Latest Range: 0.33-1.94 mg/dL 9.61 (H)   Lamda free  light chains Latest Range: 0.57-2.63 mg/dL 4.99 (H)   Kappa, lamda light chain ratio Latest Range: 0.26-1.65  1.93 (H)    Results for [name], [name] (MRN [id number]) as of 04/01/2014 12:14  Ref. Range 03/22/2014 09:04  Sed Rate Latest Range: 0-16 mm/hr 30 (H)       RADIOGRAPHIC STUDIES:  01/24/2014  ADDENDUM REPORT: 01/24/2014 08:59  ADDENDUM: Study discussed by telephone with PA Rowan Blase, covering for Dr. Redmond School on 01/24/2014 at 08:57. We discussed the T11 lesion, but also at the time of our phone conversation I realized there are multiple more subtle additional lytic lesions in the thoracic spine, most notably a 12 mm lesion in the anterior T8 vertebral body. These are best depicted on sagittal images (series 8). The appearance is compatible with active and fairly widespread thoracic multiple myeloma.   Electronically Signed  By: Lars Pinks M.D.  On: 01/24/2014 08:59      Study Result     CLINICAL DATA: 68 year old male with right side thoracic and chest wall pain. Right scapula pain. Initial encounter. Periods of coughing 2-3 weeks ago. Multiple myeloma.  EXAM: CT THORACIC SPINE WITHOUT CONTRAST  TECHNIQUE: Multidetector CT imaging of the thoracic spine was performed without intravenous contrast administration. Multiplanar CT image reconstructions were also generated.  COMPARISON: Chest radiographs 01/10/2014 and earlier. PET-CT 04/04/2010. Chest CT 01/09/2005.  FINDINGS: Partially visible lower cervical ACDF and posterior laminar hr hardware. Posterior hardware extends to the T1 level, including a transpedicular screw on the right. Cervicothoracic junction is normally aligned.  Osteopenia. Upper thoracic vertebrae are intact. There is bulky sclerotic osteophytosis about the right costovertebral junctions from T5 to T7, with associated abnormal sclerosis of the medial posterior right ribs at these levels.  There is a lytic lesion  replacing much of the T11 vertebral body, tracking into the right lamina via the pedicle, and eroding the posterior cortex with early ventral epidural extension (series 3, image 120). The lesion encompasses 39 x 33 x 21 mm (AP by transverse by CC) The right pedicle is mildly expanded. No definite tumor in the right T11 foramen. No associated compression fracture.  The T12 level it is intact. No other thoracic spine lytic lesion identified. Other posterior ribs appear intact.  The medial aspect of the scapula a are visible, and demonstrate heterogeneous bone mineralization. No definite medial scapula fracture or lytic mass.  Negative visualized posterior paraspinal soft tissues.  Small layering right pleural effusion. Streaky and dependent pulmonary opacity in the lower lungs most resembles atelectasis. Partially visible renal atrophy. Partially visible cardiomegaly. Coronary artery calcified atherosclerosis. No mediastinal lymphadenopathy.  IMPRESSION: 1. 3-4 cm lytic lesion of the T11 vertebral body, compatible with multiple myeloma, eroding the posterior cortex and ventral  epidural extension. 2. No pathologic fracture at this time. 3. Abnormal appearance of the right T5-T7 posterior ribs and costovertebral junctions suggesting treated or healed myeloma this lesions. 4. Partially visible extensive lower cervical spine to T1 postoperative changes with fusion hardware. 5. Small layering right pleural effusion.  Electronically Signed: By: Lars Pinks M.D. On: 01/24/2014 08:43       PATHOLOGY:  01/30/2014  Diagnosis Bone, biopsy, right T11 - BONE MARROW WITH TRILINEAGE HEMATOPOIESIS. Microscopic Comment The sections show abundant fragments representing bone marrow material displaying a mixture of myeloid cell types. Large clusters or sheets of plasma cells are not identified. To further evaluate this process, immunohistochemical stains for CD138, kappa, lambda,  CD117, CD34, E-Cadherin, myeloperoxidase, and cytokeratin AE1/AE3 were performed with appropriate controls. CD138 highlights the relatively minor plasma cell component present in the background, which appears to show a polyclonal staining pattern for kappa and lambda light chains. No cytokeratin positivity or significant CD117 or CD34 positivity is identified. Myeloperoxidase highlights the granulocytic component and E-Cadherin highlights some of the early erythroid cells. Overall, there is no obvious tumor identified in this material and hence clinical correlation is recommended. (BNS:ecj 02/05/2014) Gregory Greenhouse MD Pathologist, Electronic Signature (Case signed 02/06/2014)     ASSESSMENT:  1. 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 Marcell [name] at [facility] restarted on September 05, 2012, 7 days on and 7 days off. Dexamethasone D/C'd due to patient reported intolerance.  2. Anaphylactic reaction to bisphosphonate therapy, D/C'd  3. Chronic Kidney disease, stage 5, followed by nephrology  4. Anemia of chronic disease secondary to #1 and #3. On Aranesp every 3 weeks  Patient Active Problem List   Diagnosis Date Noted  . Pathologic fracture 01/30/2014  . Anemia of chronic disease 12/30/2013  . Chronic renal disease, stage 5, glomerular filtration rate less than or equal to 15 mL/min/1.73 square meter 12/30/2013  . Lymphedema of lower extremity 08/08/2013  . Unspecified vitamin D deficiency 03/06/2013  . Intravenous pamidronate causing adverse effect in therapeutic use 10/14/2012  . Multiple myeloma 01/26/2011  . ESSENTIAL HYPERTENSION, BENIGN 03/05/2010  . BRADYCARDIA 03/05/2010     PLAN:  1. I personally reviewed and went over laboratory results with the patient.  The results are noted within this dictation. 2. I personally reviewed and went over radiographic studies with the patient.  The results are  noted within this dictation.   3. I personally reviewed and went over pathology results with the patient. 4. Labs every 3 weeks: CBC  5. Labs every 6 weeks: CBC diff, CMET, CRP, MM panel, B2M  6. Supportive therapy plan reviewed.  7. Recommend continued follow-up with nephrologist regarding renal function. 8. Referral to Dr. Marcell [name] for follow-up appointment prior to retirement.  Interested in learning treatment options for [place] he were to fail Pomalyst given the current treatment options for multiple myeloma, while being mindful of [place]'s impending hemodialysis requirement. 9. Recommend Senokot-S 1-2 tablets 1-2 times per day as a bowel regimen 10. Return in 3 months for follow-up   THERAPY PLAN:  He will continue with Pomalyst as prescribed and outlined above. We will monitor his labs and support his counts. We will monitor multiple myeloma markers and be on the look out for relapse.  All questions were answered. The patient knows to call the clinic with any problems, questions or concerns. We can certainly see the patient much sooner if necessary.  Patient and plan  discussed with Dr. Farrel Gobble and he is in agreement with the aforementioned.   KEFALAS,THOMAS 04/04/2014

## 2014-04-02 ENCOUNTER — Encounter: Payer: Self-pay | Admitting: Vascular Surgery

## 2014-04-02 ENCOUNTER — Other Ambulatory Visit: Payer: Self-pay

## 2014-04-02 DIAGNOSIS — Z01810 Encounter for preprocedural cardiovascular examination: Secondary | ICD-10-CM

## 2014-04-02 DIAGNOSIS — N185 Chronic kidney disease, stage 5: Secondary | ICD-10-CM

## 2014-04-04 ENCOUNTER — Encounter (HOSPITAL_BASED_OUTPATIENT_CLINIC_OR_DEPARTMENT_OTHER): Payer: PRIVATE HEALTH INSURANCE | Admitting: Oncology

## 2014-04-04 VITALS — BP 138/81 | HR 57 | Temp 98.3°F | Resp 18

## 2014-04-04 DIAGNOSIS — D630 Anemia in neoplastic disease: Secondary | ICD-10-CM

## 2014-04-04 DIAGNOSIS — N185 Chronic kidney disease, stage 5: Secondary | ICD-10-CM

## 2014-04-04 DIAGNOSIS — D638 Anemia in other chronic diseases classified elsewhere: Secondary | ICD-10-CM

## 2014-04-04 DIAGNOSIS — D631 Anemia in chronic kidney disease: Secondary | ICD-10-CM

## 2014-04-04 DIAGNOSIS — C9000 Multiple myeloma not having achieved remission: Secondary | ICD-10-CM

## 2014-04-04 DIAGNOSIS — K5900 Constipation, unspecified: Secondary | ICD-10-CM

## 2014-04-04 NOTE — Patient Instructions (Signed)
[facility] Discharge Instructions  RECOMMENDATIONS MADE BY THE CONSULTANT AND ANY TEST RESULTS WILL BE SENT TO YOUR REFERRING PHYSICIAN.  For your bowels, you can use Senokot-S 1-2 tablets twice daily to keep your stools soft. Labs every 3 weeks for Aranesp injection Labs every 6 weeks for multiple myeloma surveillance Return in 12 weeks for follow-up.  Thank you for choosing [facility] to provide your oncology and hematology care.  To afford each patient quality time with our providers, please arrive at least 15 minutes before your scheduled appointment time.  With your help, our goal is to use those 15 minutes to complete the necessary work-up to ensure our physicians have the information they need to help with your evaluation and healthcare recommendations.    Effective January 1st, 2014, we ask that you re-schedule your appointment with our physicians should you arrive 10 or more minutes late for your appointment.  We strive to give you quality time with our providers, and arriving late affects you and other patients whose appointments are after yours.    Again, thank you for choosing [facility].  Our hope is that these requests will decrease the amount of time that you wait before being seen by our physicians.       _____________________________________________________________  Should you have questions after your visit to [facility], please contact our office at [phone] between the hours of 8:30 a.m. and 5:00 p.m.  Voicemails left after 4:30 p.m. will not be returned until the following business day.  For prescription refill requests, have your pharmacy contact our office with your prescription refill request.

## 2014-04-09 ENCOUNTER — Encounter (HOSPITAL_BASED_OUTPATIENT_CLINIC_OR_DEPARTMENT_OTHER): Payer: PRIVATE HEALTH INSURANCE

## 2014-04-09 ENCOUNTER — Encounter (HOSPITAL_COMMUNITY): Payer: PRIVATE HEALTH INSURANCE

## 2014-04-09 DIAGNOSIS — D630 Anemia in neoplastic disease: Secondary | ICD-10-CM

## 2014-04-09 DIAGNOSIS — C9000 Multiple myeloma not having achieved remission: Secondary | ICD-10-CM

## 2014-04-09 LAB — CBC
HCT: 34.6 % — ABNORMAL LOW (ref 39.0–52.0)
Hemoglobin: 11.9 g/dL — ABNORMAL LOW (ref 13.0–17.0)
MCH: 34.6 pg — ABNORMAL HIGH (ref 26.0–34.0)
MCHC: 34.4 g/dL (ref 30.0–36.0)
MCV: 100.6 fL — ABNORMAL HIGH (ref 78.0–100.0)
Platelets: 60 10*3/uL — ABNORMAL LOW (ref 150–400)
RBC: 3.44 MIL/uL — AB (ref 4.22–5.81)
RDW: 15.2 % (ref 11.5–15.5)
WBC: 5.4 10*3/uL (ref 4.0–10.5)

## 2014-04-09 NOTE — Progress Notes (Signed)
LABS FOR CBC 

## 2014-04-09 NOTE — Progress Notes (Signed)
Hemoglobin 11.9 today. Aranesp not given/not needed, treatment parameters not met. Return in 3 weeks as scheduled.

## 2014-04-11 ENCOUNTER — Encounter (HOSPITAL_COMMUNITY): Payer: PRIVATE HEALTH INSURANCE

## 2014-04-16 ENCOUNTER — Other Ambulatory Visit (HOSPITAL_COMMUNITY): Payer: Self-pay | Admitting: Oncology

## 2014-04-16 DIAGNOSIS — C9000 Multiple myeloma not having achieved remission: Secondary | ICD-10-CM

## 2014-04-16 MED ORDER — POMALIDOMIDE 1 MG PO CAPS: ORAL_CAPSULE | ORAL | Status: DC

## 2014-04-19 ENCOUNTER — Telehealth (HOSPITAL_COMMUNITY): Payer: Self-pay | Admitting: Oncology

## 2014-04-20 ENCOUNTER — Other Ambulatory Visit (HOSPITAL_COMMUNITY): Payer: Self-pay | Admitting: Oncology

## 2014-04-20 DIAGNOSIS — C9000 Multiple myeloma not having achieved remission: Secondary | ICD-10-CM

## 2014-04-20 DIAGNOSIS — D638 Anemia in other chronic diseases classified elsewhere: Secondary | ICD-10-CM

## 2014-04-23 ENCOUNTER — Telehealth (HOSPITAL_COMMUNITY): Payer: Self-pay | Admitting: Oncology

## 2014-04-23 NOTE — Telephone Encounter (Signed)
I am returning [facility] phone call from the [facility].  "I am so tired and fatigued I can't get back up if I fall to my knees."  "I knows its that chemo-pill."  I recommended he hold Pomalyst x 2 weeks and he responds, "Don't you think you should run that past Dr. Marcell Anger first?"  Therefore, I have called Dr. Marcell Anger.  He is retiring in 2 weeks and thinks it is appropriate for Yvan to see Dr. Norma Fredrickson after the New year to establish his care with him.  This is absolutely appropriate.    I called [place] back to relay this information to [place].    He will hold Pomalyst x 4 weeks and see if he improves.  Obie Silos 04/23/2014

## 2014-04-25 ENCOUNTER — Encounter (HOSPITAL_BASED_OUTPATIENT_CLINIC_OR_DEPARTMENT_OTHER): Payer: PRIVATE HEALTH INSURANCE

## 2014-04-25 ENCOUNTER — Encounter (HOSPITAL_COMMUNITY): Payer: PRIVATE HEALTH INSURANCE | Attending: Oncology

## 2014-04-25 DIAGNOSIS — N185 Chronic kidney disease, stage 5: Secondary | ICD-10-CM

## 2014-04-25 DIAGNOSIS — D631 Anemia in chronic kidney disease: Secondary | ICD-10-CM

## 2014-04-25 DIAGNOSIS — C9000 Multiple myeloma not having achieved remission: Secondary | ICD-10-CM

## 2014-04-25 LAB — CBC
HEMATOCRIT: 30.1 % — AB (ref 39.0–52.0)
HEMOGLOBIN: 10.5 g/dL — AB (ref 13.0–17.0)
MCH: 34.3 pg — ABNORMAL HIGH (ref 26.0–34.0)
MCHC: 34.9 g/dL (ref 30.0–36.0)
MCV: 98.4 fL (ref 78.0–100.0)
Platelets: 50 10*3/uL — ABNORMAL LOW (ref 150–400)
RBC: 3.06 MIL/uL — ABNORMAL LOW (ref 4.22–5.81)
RDW: 14.4 % (ref 11.5–15.5)
WBC: 4.4 10*3/uL (ref 4.0–10.5)

## 2014-04-25 MED ORDER — DARBEPOETIN ALFA 500 MCG/ML IJ SOSY
PREFILLED_SYRINGE | INTRAMUSCULAR | Status: AC
  Filled 2014-04-25: qty 1

## 2014-04-25 MED ORDER — DARBEPOETIN ALFA 500 MCG/ML IJ SOSY
500.0000 ug | PREFILLED_SYRINGE | Freq: Once | INTRAMUSCULAR | Status: AC
  Administered 2014-04-25: 500 ug via SUBCUTANEOUS

## 2014-04-25 NOTE — Progress Notes (Signed)
[name] presents today for injection per the provider's orders.  aranesp administration without incident; see MAR for injection details.  Patient tolerated procedure well and without incident.  No questions or complaints noted at this time.

## 2014-04-25 NOTE — Progress Notes (Signed)
LABS FOR CBC+LABS FOR DR [facility]

## 2014-04-26 ENCOUNTER — Encounter: Payer: Self-pay | Admitting: Vascular Surgery

## 2014-04-27 ENCOUNTER — Ambulatory Visit (INDEPENDENT_AMBULATORY_CARE_PROVIDER_SITE_OTHER)
Admission: RE | Admit: 2014-04-27 | Discharge: 2014-04-27 | Disposition: A | Discharge status: Home / Self Care | Payer: PRIVATE HEALTH INSURANCE | Source: Ambulatory Visit | Attending: Vascular Surgery | Admitting: Vascular Surgery

## 2014-04-27 ENCOUNTER — Ambulatory Visit (HOSPITAL_COMMUNITY)
Admission: RE | Admit: 2014-04-27 | Discharge: 2014-04-27 | Disposition: A | Discharge status: Home / Self Care | Payer: PRIVATE HEALTH INSURANCE | Source: Ambulatory Visit | Attending: Vascular Surgery | Admitting: Vascular Surgery

## 2014-04-27 ENCOUNTER — Encounter: Payer: Self-pay | Admitting: Vascular Surgery

## 2014-04-27 ENCOUNTER — Ambulatory Visit (INDEPENDENT_AMBULATORY_CARE_PROVIDER_SITE_OTHER): Payer: PRIVATE HEALTH INSURANCE | Admitting: Vascular Surgery

## 2014-04-27 ENCOUNTER — Encounter (HOSPITAL_COMMUNITY): Payer: Self-pay | Admitting: *Deleted

## 2014-04-27 ENCOUNTER — Other Ambulatory Visit: Payer: Self-pay

## 2014-04-27 VITALS — BP 176/94 | HR 52 | Resp 18 | Ht 71.0 in | Wt 246.1 lb

## 2014-04-27 DIAGNOSIS — N185 Chronic kidney disease, stage 5: Secondary | ICD-10-CM | POA: Insufficient documentation

## 2014-04-27 DIAGNOSIS — Z01810 Encounter for preprocedural cardiovascular examination: Secondary | ICD-10-CM | POA: Diagnosis not present

## 2014-04-27 NOTE — Progress Notes (Signed)
  Referred by:  William McGough, MD [address] [place], [place] [address]  Reason for referral: New access  History of Present Illness  [name] is a 68 y.o. (08/06/1945) male with history chemotherapy for MM who presents for evaluation for permanent access.  The patient is right hand dominant.  The patient has not had previous access procedures.  Previous central venous cannulation procedures include: none.  He denies mediport placement.  The patient has never had a PPM placed.  Additionally, on ROS: numbness in both hands and feet due to chemo  Past Medical History  Diagnosis Date  . Hypertension   . Ruptured cervical disc   . Fall resulting in striking against other object     paralyzed  . Multiple myeloma   . 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 Multiple myeloma 01/26/2011  . Unspecified vitamin D deficiency 03/06/2013  . Anemia of chronic disease 12/30/2013  . Chronic renal disease, stage 5, glomerular filtration rate less than or equal to 15 mL/min/1.73 square meter 12/30/2013  . Constipation     Past Surgical History  Procedure Laterality Date  . Inguinal hernia repair Bilateral   . Anterior cervical decomp/discectomy fusion    . Bone marrow aspirate and biopsy wiith lumbar puncture    . Melanoma excision      rt. breast  . Cervical spine surgery    . Kyphoplasty Bilateral 01/30/2014    Procedure: Thoracic eleven Kyphoplasty;  Surgeon: Neelesh Nundkumar, MD;  Location: [facility];  Service: Neurosurgery;  Laterality: Bilateral;  Thoracic eleven Kyphoplasty    History   Social History  . Marital Status: Divorced    Spouse Name: N/A    Number of Children: N/A  . Years of Education: N/A   Occupational History  . Not on file.   Social History Main Topics  . Smoking status: Never Smoker   . Smokeless tobacco: Never Used  . Alcohol Use: No  . Drug Use: No  . Sexual Activity: Not on file   Other Topics Concern  .  Not on file   Social History Narrative    Family History  Problem Relation Age of Onset  . Cancer Sister   . Cancer Brother     Current Outpatient Prescriptions on File Prior to Visit  Medication Sig Dispense Refill  . Cholecalciferol (VITAMIN D-3) 1000 UNITS CAPS Take 1,000 Units by mouth daily.     . metoprolol succinate (TOPROL XL) 25 MG 24 hr tablet Take 25 mg by mouth daily.     . NIFEdipine (PROCARDIA XL) 60 MG 24 hr tablet Take 60 mg by mouth daily. 60 mg am and 30 mg at night    . NIFEdipine (PROCARDIA-XL/ADALAT-CC/NIFEDICAL-XL) 30 MG 24 hr tablet     . omega-3 acid ethyl esters (LOVAZA) 1 G capsule Take 1 g by mouth 2 (two) times daily.    . pomalidomide (POMALYST) 1 MG capsule Take 1 tablet every other day 14 days and then 7 days off, then repeat 7 capsule 0  . sodium polystyrene (KAYEXALATE) 15 GM/60ML suspension Take 15 g by mouth once a week. Sundays    . torsemide (DEMADEX) 20 MG tablet Take 40 mg by mouth daily as needed (swelling/fluid buildup).     . magnesium hydroxide (MILK OF MAGNESIA) 400 MG/5ML suspension Take 30 mLs by mouth daily as needed for mild constipation.     No current facility-administered medications on file prior to   visit.    Allergies  Allergen Reactions  . Pamidronate Anaphylaxis    REVIEW OF SYSTEMS:  (Positives checked otherwise negative)  CARDIOVASCULAR:  [] chest pain, [] chest pressure, [] palpitations, [] shortness of breath when laying flat, [] shortness of breath with exertion,  [] pain in feet when walking, [] pain in feet when laying flat, [] history of blood clot in veins (DVT), [] history of phlebitis, [x] swelling in legs, [] varicose veins  PULMONARY:  [] productive cough, [] asthma, [] wheezing  NEUROLOGIC:  [x] weakness in arms or legs, [x] numbness in arms or legs, [] difficulty speaking or slurred speech, [] temporary loss of vision in one eye, [] dizziness  HEMATOLOGIC:  [] bleeding problems, [] problems with blood  clotting too easily  MUSCULOSKEL:  [] joint pain, [] joint swelling  GASTROINTEST:  [] vomiting blood, [] blood in stool     GENITOURINARY:  [] burning with urination, [] blood in urine  PSYCHIATRIC:  [] history of major depression  INTEGUMENTARY:  [] rashes, [] ulcers  CONSTITUTIONAL:  [] fever, [x] chills  Physical Examination  Filed Vitals:   04/27/14 1213  BP: 176/94  Pulse: 52  Resp: 18  Height: 5' 11" (1.803 m)  Weight: 246 lb 1.6 oz (111.63 kg)   Body mass index is 34.34 kg/(m^2).  General: A&O x 3, WDWN, angry and hostile  Head: [place]/AT  Ear/Nose/Throat: Hearing grossly intact, nares w/o erythema or drainage, oropharynx w/o Erythema/Exudate, Mallampati score: 3  Eyes: PERRLA, EOMI  Neck: Supple, no nuchal rigidity, no palpable LAD  Pulmonary: Sym exp, good air movt, CTAB, no rales, rhonchi, & wheezing  Cardiac: RRR, Nl S1, S2, no Murmurs, rubs or gallops  Vascular: Vessel Right Left  Radial Palpable Palpable  Ulnar Not Palpable Not Palpable  Brachial Palpable Palpable  Carotid Palpable, without bruit Palpable, without bruit  Aorta Not palpable N/A  Femoral Palpable Palpable  Popliteal Not palpable Not palpable  PT Faintly Palpable Faintly Palpable  DP Faintly Palpable Faintly Palpable   Gastrointestinal: soft, NTND, -G/R, - HSM, - masses, - CVAT B  Musculoskeletal: M/S 5/5 throughout , Extremities without ischemic changes , on Sonosite: neither brachial vein large enough for fistula, R cephalic vein might be big enough for fistula  Neurologic: CN 2-12 intact , Pain and light touch intact in extremities , Motor exam as listed above  Psychiatric: Judgment intact, Angry and hostile  Dermatologic: See M/S exam for extremity exam, no rashes otherwise noted  Lymph : No Cervical, Axillary, or Inguinal lymphadenopathy   Non-Invasive Vascular Imaging  Vein Mapping  (Date: 04/27/2014):   R arm: acceptable vein conduits include none  L arm: acceptable  vein conduits include none  BUE Doppler (Date: 04/27/2014):   R arm:   Brachial: 5.6 mm, tri  Radial: 2.6 mm, tri  Ulnar: 1.9 mm, tri  L arm:   Brachial: 5.9 mm, tri  Radial: 3.3 mm, tri  Ulnar: 1.6 mm, tri  Outside Studies/Documentation 10 pages of outside documents were reviewed including: outpatient nephrology chart.  Medical Decision Making  Nickolas C Discher is a 68 y.o. male who presents with chronic kidney disease stage V   Based on vein mapping and examination, this patient's permanent access options include: no fistula options.  On my own Sonosite evaluation, his only fistula option is a R BC AVF, but obviously this is a high risk for inability to complete given the marginal size.  I would not proceed with   AVG placement immediately.  I had an extensive discussion with this patient in regards to the nature of access surgery, including risk, benefits, and alternatives.    The patient is aware that the risks of access surgery include but are not limited to: bleeding, infection, steal syndrome, nerve damage, ischemic monomelic neuropathy, failure of access to mature, and possible need for additional access procedures in the future.  The patient has agreed to proceed with the above procedure which will be scheduled 14 DEC 15.  Montrell Cessna, MD [facility] Office: [phone] Pager: [phone]  04/27/2014, 1:19 PM     

## 2014-04-29 MED ORDER — DEXTROSE 5 % IV SOLN
1.5000 g | INTRAVENOUS | Status: AC
  Administered 2014-04-30: 1.5 g via INTRAVENOUS
  Filled 2014-04-29: qty 1.5

## 2014-04-30 ENCOUNTER — Encounter (HOSPITAL_COMMUNITY)
Admission: RE | Disposition: A | Discharge status: Home / Self Care | Payer: Self-pay | Source: Ambulatory Visit | Attending: Vascular Surgery

## 2014-04-30 ENCOUNTER — Ambulatory Visit (HOSPITAL_COMMUNITY): Payer: PRIVATE HEALTH INSURANCE | Admitting: Certified Registered Nurse Anesthetist

## 2014-04-30 ENCOUNTER — Ambulatory Visit (HOSPITAL_COMMUNITY)
Admission: RE | Admit: 2014-04-30 | Discharge: 2014-04-30 | Disposition: A | Discharge status: Home / Self Care | Payer: PRIVATE HEALTH INSURANCE | Source: Ambulatory Visit | Attending: Vascular Surgery | Admitting: Vascular Surgery

## 2014-04-30 ENCOUNTER — Encounter (HOSPITAL_COMMUNITY): Payer: Self-pay | Admitting: *Deleted

## 2014-04-30 DIAGNOSIS — E559 Vitamin D deficiency, unspecified: Secondary | ICD-10-CM | POA: Insufficient documentation

## 2014-04-30 DIAGNOSIS — N185 Chronic kidney disease, stage 5: Secondary | ICD-10-CM | POA: Diagnosis not present

## 2014-04-30 DIAGNOSIS — N184 Chronic kidney disease, stage 4 (severe): Secondary | ICD-10-CM | POA: Diagnosis not present

## 2014-04-30 DIAGNOSIS — I120 Hypertensive chronic kidney disease with stage 5 chronic kidney disease or end stage renal disease: Secondary | ICD-10-CM | POA: Insufficient documentation

## 2014-04-30 HISTORY — PX: AV FISTULA PLACEMENT: SHX1204

## 2014-04-30 LAB — POCT I-STAT 4, (NA,K, GLUC, HGB,HCT)
Glucose, Bld: 102 mg/dL — ABNORMAL HIGH (ref 70–99)
HCT: 28 % — ABNORMAL LOW (ref 39.0–52.0)
Hemoglobin: 9.5 g/dL — ABNORMAL LOW (ref 13.0–17.0)
Potassium: 4.1 mEq/L (ref 3.7–5.3)
Sodium: 140 mEq/L (ref 137–147)

## 2014-04-30 SURGERY — ARTERIOVENOUS (AV) FISTULA CREATION
Anesthesia: General | Site: Arm Upper | Laterality: Right

## 2014-04-30 MED ORDER — PROPOFOL INFUSION 10 MG/ML OPTIME
INTRAVENOUS | Status: DC | PRN
  Administered 2014-04-30: 75 ug/kg/min via INTRAVENOUS

## 2014-04-30 MED ORDER — PHENYLEPHRINE HCL 10 MG/ML IJ SOLN
10.0000 mg | INTRAVENOUS | Status: DC | PRN
  Administered 2014-04-30: 5 ug/min via INTRAVENOUS

## 2014-04-30 MED ORDER — SURGIFOAM 100 EX MISC
CUTANEOUS | Status: DC | PRN
  Administered 2014-04-30: 20 mL via TOPICAL

## 2014-04-30 MED ORDER — MIDAZOLAM HCL 5 MG/5ML IJ SOLN
INTRAMUSCULAR | Status: DC | PRN
  Administered 2014-04-30: 2 mg via INTRAVENOUS

## 2014-04-30 MED ORDER — THROMBIN 20000 UNITS EX SOLR
CUTANEOUS | Status: AC
  Filled 2014-04-30: qty 20000

## 2014-04-30 MED ORDER — PROPOFOL 10 MG/ML IV BOLUS
INTRAVENOUS | Status: AC
  Filled 2014-04-30: qty 20

## 2014-04-30 MED ORDER — CHLORHEXIDINE GLUCONATE CLOTH 2 % EX PADS: 6.0000 | MEDICATED_PAD | Freq: Once | CUTANEOUS | Status: DC

## 2014-04-30 MED ORDER — SODIUM CHLORIDE 0.9 % IV SOLN
INTRAVENOUS | Status: DC
  Administered 2014-04-30: 09:00:00 via INTRAVENOUS

## 2014-04-30 MED ORDER — ONDANSETRON HCL 4 MG/2ML IJ SOLN
INTRAMUSCULAR | Status: DC | PRN
  Administered 2014-04-30: 4 mg via INTRAVENOUS

## 2014-04-30 MED ORDER — FENTANYL CITRATE 0.05 MG/ML IJ SOLN
INTRAMUSCULAR | Status: AC
  Filled 2014-04-30: qty 5

## 2014-04-30 MED ORDER — FENTANYL CITRATE 0.05 MG/ML IJ SOLN
INTRAMUSCULAR | Status: DC | PRN
  Administered 2014-04-30 (×2): 25 ug via INTRAVENOUS

## 2014-04-30 MED ORDER — BUPIVACAINE HCL (PF) 0.5 % IJ SOLN
INTRAMUSCULAR | Status: DC | PRN
  Administered 2014-04-30: 3.5 mL

## 2014-04-30 MED ORDER — 0.9 % SODIUM CHLORIDE (POUR BTL) OPTIME
TOPICAL | Status: DC | PRN
  Administered 2014-04-30: 1000 mL

## 2014-04-30 MED ORDER — LIDOCAINE HCL (CARDIAC) 20 MG/ML IV SOLN
INTRAVENOUS | Status: DC | PRN
Start: 2014-04-30 — End: 2014-04-30
  Administered 2014-04-30: 40 mg via INTRAVENOUS

## 2014-04-30 MED ORDER — LIDOCAINE-EPINEPHRINE (PF) 1 %-1:200000 IJ SOLN
INTRAMUSCULAR | Status: DC | PRN
  Administered 2014-04-30: 3.5 mL

## 2014-04-30 MED ORDER — SODIUM CHLORIDE 0.9 % IR SOLN
Status: DC | PRN
  Administered 2014-04-30: 500 mL

## 2014-04-30 MED ORDER — OXYCODONE-ACETAMINOPHEN 5-325 MG PO TABS: 1.0000 | ORAL_TABLET | Freq: Four times a day (QID) | ORAL | Status: DC | PRN

## 2014-04-30 MED ORDER — PROPOFOL 10 MG/ML IV BOLUS
INTRAVENOUS | Status: DC | PRN
  Administered 2014-04-30: 20 mg via INTRAVENOUS

## 2014-04-30 MED ORDER — MIDAZOLAM HCL 2 MG/2ML IJ SOLN
INTRAMUSCULAR | Status: AC
  Filled 2014-04-30: qty 2

## 2014-04-30 MED ORDER — LIDOCAINE-EPINEPHRINE (PF) 1 %-1:200000 IJ SOLN
INTRAMUSCULAR | Status: AC
  Filled 2014-04-30: qty 10

## 2014-04-30 SURGICAL SUPPLY — 34 items
ARMBAND PINK RESTRICT EXTREMIT (MISCELLANEOUS) ×3 IMPLANT
BLADE SURG 10 STRL SS (BLADE) ×3 IMPLANT
CANISTER SUCTION 2500CC (MISCELLANEOUS) ×3 IMPLANT
CLIP TI MEDIUM 6 (CLIP) ×3 IMPLANT
CLIP TI WIDE RED SMALL 6 (CLIP) ×3 IMPLANT
COVER PROBE W GEL 5X96 (DRAPES) ×3 IMPLANT
COVER SURGICAL LIGHT HANDLE (MISCELLANEOUS) ×3 IMPLANT
DECANTER SPIKE VIAL GLASS SM (MISCELLANEOUS) ×3 IMPLANT
ELECT REM PT RETURN 9FT ADLT (ELECTROSURGICAL) ×3
ELECTRODE REM PT RTRN 9FT ADLT (ELECTROSURGICAL) ×1 IMPLANT
GLOVE BIO SURGEON STRL SZ 6.5 (GLOVE) ×2 IMPLANT
GLOVE BIO SURGEON STRL SZ7 (GLOVE) ×3 IMPLANT
GLOVE BIO SURGEONS STRL SZ 6.5 (GLOVE) ×2
GLOVE BIOGEL PI IND STRL 6.5 (GLOVE) IMPLANT
GLOVE BIOGEL PI IND STRL 7.5 (GLOVE) ×1 IMPLANT
GLOVE BIOGEL PI INDICATOR 6.5 (GLOVE) ×6
GLOVE BIOGEL PI INDICATOR 7.5 (GLOVE) ×2
GOWN STRL REUS W/ TWL LRG LVL3 (GOWN DISPOSABLE) ×3 IMPLANT
GOWN STRL REUS W/TWL LRG LVL3 (GOWN DISPOSABLE) ×9
KIT BASIN OR (CUSTOM PROCEDURE TRAY) ×3 IMPLANT
KIT ROOM TURNOVER OR (KITS) ×3 IMPLANT
LIQUID BAND (GAUZE/BANDAGES/DRESSINGS) ×3 IMPLANT
NS IRRIG 1000ML POUR BTL (IV SOLUTION) ×3 IMPLANT
PACK CV ACCESS (CUSTOM PROCEDURE TRAY) ×3 IMPLANT
PAD ARMBOARD 7.5X6 YLW CONV (MISCELLANEOUS) ×6 IMPLANT
PROBE PENCIL 8 MHZ STRL DISP (MISCELLANEOUS) ×2 IMPLANT
SPONGE SURGIFOAM ABS GEL 100 (HEMOSTASIS) ×2 IMPLANT
SUT MNCRL AB 4-0 PS2 18 (SUTURE) ×3 IMPLANT
SUT PROLENE 6 0 BV (SUTURE) ×2 IMPLANT
SUT PROLENE 7 0 BV 1 (SUTURE) ×3 IMPLANT
SUT VIC AB 3-0 SH 27 (SUTURE) ×3
SUT VIC AB 3-0 SH 27X BRD (SUTURE) ×1 IMPLANT
UNDERPAD 30X30 INCONTINENT (UNDERPADS AND DIAPERS) ×3 IMPLANT
WATER STERILE IRR 1000ML POUR (IV SOLUTION) ×3 IMPLANT

## 2014-04-30 NOTE — Anesthesia Preprocedure Evaluation (Signed)
Anesthesia Evaluation  Patient identified by MRN, date of birth, ID band Patient awake    Reviewed: Allergy & Precautions, H&P , NPO status , Patient's Chart, lab work & pertinent test results  Airway Mallampati: II  TM Distance: >3 FB     Dental  (+) Dental Advisory Given, Partial Lower, Partial Upper   Pulmonary  breath sounds clear to auscultation        Cardiovascular hypertension, Rhythm:Regular Rate:Normal     Neuro/Psych    GI/Hepatic   Endo/Other    Renal/GU      Musculoskeletal   Abdominal   Peds  Hematology   Anesthesia Other Findings   Reproductive/Obstetrics                             Anesthesia Physical Anesthesia Plan  ASA: III  Anesthesia Plan: General   Post-op Pain Management:    Induction: Intravenous  Airway Management Planned: LMA  Additional Equipment:   Intra-op Plan:   Post-operative Plan:   Informed Consent: I have reviewed the patients History and Physical, chart, labs and discussed the procedure including the risks, benefits and alternatives for the proposed anesthesia with the patient or authorized representative who has indicated his/her understanding and acceptance.   Dental advisory given  Plan Discussed with: CRNA and Anesthesiologist  Anesthesia Plan Comments: (ESRD needs dialysis access k-4.1 Multiple myeloma Hypertension Thrombocytopenia Platelet count 50,000  Plan GA with LMA  Roberts Gaudy )        Anesthesia Quick Evaluation

## 2014-04-30 NOTE — H&P (View-Only) (Signed)
Referred by:  Elsie Lincoln, MD [address] [place], [place] [address]  Reason for referral: New access  History of Present Illness  UNKNOWN SCHLEYER is a 68 y.o. (1946/02/09) male with history chemotherapy for MM who presents for evaluation for permanent access.  The patient is right hand dominant.  The patient has not had previous access procedures.  Previous central venous cannulation procedures include: none.  He denies mediport placement.  The patient has never had a PPM placed.  Additionally, on ROS: numbness in both hands and feet due to chemo  Past Medical History  Diagnosis Date  . Hypertension   . Ruptured cervical disc   . Fall resulting in striking against other object     paralyzed  . Multiple myeloma   . 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 Multiple myeloma 01/26/2011  . Unspecified vitamin D deficiency 03/06/2013  . Anemia of chronic disease 12/30/2013  . Chronic renal disease, stage 5, glomerular filtration rate less than or equal to 15 mL/min/1.73 square meter 12/30/2013  . Constipation     Past Surgical History  Procedure Laterality Date  . Inguinal hernia repair Bilateral   . Anterior cervical decomp/discectomy fusion    . Bone marrow aspirate and biopsy wiith lumbar puncture    . Melanoma excision      rt. breast  . Cervical spine surgery    . Kyphoplasty Bilateral 01/30/2014    Procedure: Thoracic eleven Kyphoplasty;  Surgeon: Consuella Lose, MD;  Location: [facility];  Service: Neurosurgery;  Laterality: Bilateral;  Thoracic eleven Kyphoplasty    History   Social History  . Marital Status: Divorced    Spouse Name: N/A    Number of Children: N/A  . Years of Education: N/A   Occupational History  . Not on file.   Social History Main Topics  . Smoking status: Never Smoker   . Smokeless tobacco: Never Used  . Alcohol Use: No  . Drug Use: No  . Sexual Activity: Not on file   Other Topics Concern  .  Not on file   Social History Narrative    Family History  Problem Relation Age of Onset  . Cancer Sister   . Cancer Brother     Current Outpatient Prescriptions on File Prior to Visit  Medication Sig Dispense Refill  . Cholecalciferol (VITAMIN D-3) 1000 UNITS CAPS Take 1,000 Units by mouth daily.     . metoprolol succinate (TOPROL XL) 25 MG 24 hr tablet Take 25 mg by mouth daily.     Marland Kitchen NIFEdipine (PROCARDIA XL) 60 MG 24 hr tablet Take 60 mg by mouth daily. 60 mg am and 30 mg at night    . NIFEdipine (PROCARDIA-XL/ADALAT-CC/NIFEDICAL-XL) 30 MG 24 hr tablet     . omega-3 acid ethyl esters (LOVAZA) 1 G capsule Take 1 g by mouth 2 (two) times daily.    . pomalidomide (POMALYST) 1 MG capsule Take 1 tablet every other day 14 days and then 7 days off, then repeat 7 capsule 0  . sodium polystyrene (KAYEXALATE) 15 GM/60ML suspension Take 15 g by mouth once a week. Sundays    . torsemide (DEMADEX) 20 MG tablet Take 40 mg by mouth daily as needed (swelling/fluid buildup).     . magnesium hydroxide (MILK OF MAGNESIA) 400 MG/5ML suspension Take 30 mLs by mouth daily as needed for mild constipation.     No current facility-administered medications on file prior to  visit.    Allergies  Allergen Reactions  . Pamidronate Anaphylaxis    REVIEW OF SYSTEMS:  (Positives checked otherwise negative)  CARDIOVASCULAR:  [] chest pain, [] chest pressure, [] palpitations, [] shortness of breath when laying flat, [] shortness of breath with exertion,  [] pain in feet when walking, [] pain in feet when laying flat, [] history of blood clot in veins (DVT), [] history of phlebitis, [x] swelling in legs, [] varicose veins  PULMONARY:  [] productive cough, [] asthma, [] wheezing  NEUROLOGIC:  [x] weakness in arms or legs, [x] numbness in arms or legs, [] difficulty speaking or slurred speech, [] temporary loss of vision in one eye, [] dizziness  HEMATOLOGIC:  [] bleeding problems, [] problems with blood  clotting too easily  MUSCULOSKEL:  [] joint pain, [] joint swelling  GASTROINTEST:  [] vomiting blood, [] blood in stool     GENITOURINARY:  [] burning with urination, [] blood in urine  PSYCHIATRIC:  [] history of major depression  INTEGUMENTARY:  [] rashes, [] ulcers  CONSTITUTIONAL:  [] fever, [x] chills  Physical Examination  Filed Vitals:   04/27/14 1213  BP: 176/94  Pulse: 52  Resp: 18  Height: 5' 11" (1.803 m)  Weight: 246 lb 1.6 oz (111.63 kg)   Body mass index is 34.34 kg/(m^2).  General: A&O x 3, WDWN, angry and hostile  Head: [place]/AT  Ear/Nose/Throat: Hearing grossly intact, nares w/o erythema or drainage, oropharynx w/o Erythema/Exudate, Mallampati score: 3  Eyes: PERRLA, EOMI  Neck: Supple, no nuchal rigidity, no palpable LAD  Pulmonary: Sym exp, good air movt, CTAB, no rales, rhonchi, & wheezing  Cardiac: RRR, Nl S1, S2, no Murmurs, rubs or gallops  Vascular: Vessel Right Left  Radial Palpable Palpable  Ulnar Not Palpable Not Palpable  Brachial Palpable Palpable  Carotid Palpable, without bruit Palpable, without bruit  Aorta Not palpable N/A  Femoral Palpable Palpable  Popliteal Not palpable Not palpable  PT Faintly Palpable Faintly Palpable  DP Faintly Palpable Faintly Palpable   Gastrointestinal: soft, NTND, -G/R, - HSM, - masses, - CVAT B  Musculoskeletal: M/S 5/5 throughout , Extremities without ischemic changes , on Sonosite: neither brachial vein large enough for fistula, R cephalic vein might be big enough for fistula  Neurologic: CN 2-12 intact , Pain and light touch intact in extremities , Motor exam as listed above  Psychiatric: Judgment intact, Angry and hostile  Dermatologic: See M/S exam for extremity exam, no rashes otherwise noted  Lymph : No Cervical, Axillary, or Inguinal lymphadenopathy   Non-Invasive Vascular Imaging  Vein Mapping  (Date: 04/27/2014):   R arm: acceptable vein conduits include none  L arm: acceptable  vein conduits include none  BUE Doppler (Date: 04/27/2014):   R arm:   Brachial: 5.6 mm, tri  Radial: 2.6 mm, tri  Ulnar: 1.9 mm, tri  L arm:   Brachial: 5.9 mm, tri  Radial: 3.3 mm, tri  Ulnar: 1.6 mm, tri  Outside Studies/Documentation 10 pages of outside documents were reviewed including: outpatient nephrology chart.  Medical Decision Making  LLEWELYN SHEAFFER is a 68 y.o. male who presents with chronic kidney disease stage V   Based on vein mapping and examination, this patient's permanent access options include: no fistula options.  On my own Sonosite evaluation, his only fistula option is a R BC AVF, but obviously this is a high risk for inability to complete given the marginal size.  I would not proceed with  AVG placement immediately.  I had an extensive discussion with this patient in regards to the nature of access surgery, including risk, benefits, and alternatives.    The patient is aware that the risks of access surgery include but are not limited to: bleeding, infection, steal syndrome, nerve damage, ischemic monomelic neuropathy, failure of access to mature, and possible need for additional access procedures in the future.  The patient has agreed to proceed with the above procedure which will be scheduled 14 DEC 15.  Adele Barthel, MD [facility] Office: [phone] Pager: [phone]  04/27/2014, 1:19 PM

## 2014-04-30 NOTE — Transfer of Care (Signed)
Immediate Anesthesia Transfer of Care Note  Patient: [name]  Procedure(s) Performed: Procedure(s): BRACHIOCEPHALIC ARTERIOVENOUS (AV) FISTULA CREATION (Right)  Patient Location: PACU  Anesthesia Type:MAC  Level of Consciousness: awake, alert  and oriented  Airway & Oxygen Therapy: Patient Spontanous Breathing and Patient connected to nasal cannula oxygen  Post-op Assessment: Report given to PACU RN and Post -op Vital signs reviewed and stable  Post vital signs: Reviewed and stable  Complications: No apparent anesthesia complications

## 2014-04-30 NOTE — Discharge Instructions (Signed)
What to eat: ° °For your first meals, you should eat lightly; only small meals initially.  If you do not have nausea, you may eat larger meals.  Avoid spicy, greasy and heavy food.   ° °General Anesthesia, Adult, Care After  °Refer to this sheet in the next few weeks. These instructions provide you with information on caring for yourself after your procedure. Your health care provider may also give you more specific instructions. Your treatment has been planned according to current medical practices, but problems sometimes occur. Call your health care provider if you have any problems or questions after your procedure.  °WHAT TO EXPECT AFTER THE PROCEDURE  °After the procedure, it is typical to experience:  °Sleepiness.  °Nausea and vomiting. °HOME CARE INSTRUCTIONS  °For the first 24 hours after general anesthesia:  °Have a responsible person with you.  °Do not drive a car. If you are alone, do not take public transportation.  °Do not drink alcohol.  °Do not take medicine that has not been prescribed by your health care provider.  °Do not sign important papers or make important decisions.  °You may resume a normal diet and activities as directed by your health care provider.  °Change bandages (dressings) as directed.  °If you have questions or problems that seem related to general anesthesia, call the hospital and ask for the anesthetist or anesthesiologist on call. °SEEK MEDICAL CARE IF:  °You have nausea and vomiting that continue the day after anesthesia.  °You develop a rash. °SEEK IMMEDIATE MEDICAL CARE IF:  °You have difficulty breathing.  °You have chest pain.  °You have any allergic problems. °Document Released: 08/10/2000 Document Revised: 01/04/2013 Document Reviewed: 11/17/2012  °ExitCare® Patient Information ©2014 [employer].  ° °Tissue Adhesive Wound Care  ° ° °Some cuts and wounds can be closed with tissue adhesive. Adhesive is like glue. It holds the skin together and helps a wound heal faster.  This adhesive goes away on its own as the wound heals.  °HOME CARE  °Showers are allowed. Do not soak the wound in water. Do not take baths, swim, or use hot tubs. Do not use soaps or creams on your wound.  °If a bandage (dressing) was put on, change it as often as told by your doctor.  °Keep the bandage dry.  °Do not scratch, pick, or rub the adhesive.  °Do not put tape over the adhesive. The adhesive could come off.  °Protect the wound from another injury.  °Protect the wound from sun and tanning beds.  °Only take medicine as told by your doctor.  °Keep all doctor visits as told. °GET HELP RIGHT AWAY IF:  °Your wound is red, puffy (swollen), hot, or tender.  °You get a rash after the glue is put on.  °You have more pain in the wound.  °You have a red streak going away from the wound.  °You have yellowish-white fluid (pus) coming from the wound.  °You have more bleeding.  °You have a fever.  °You have chills and start to shake.  °You notice a bad smell coming from the wound.  °Your wound or adhesive breaks open. °MAKE SURE YOU:  °Understand these instructions.  °Will watch your condition.  °Will get help right away if you are not doing well or get worse. °Document Released: 02/11/2008 Document Revised: 02/22/2013 Document Reviewed: 11/23/2012  °ExitCare® Patient Information ©2015 [employer]. This information is not intended to replace advice given to you by your health care provider.   Make sure you discuss any questions you have with your health care provider.  ° ° °

## 2014-04-30 NOTE — Anesthesia Postprocedure Evaluation (Signed)
  Anesthesia Post-op Note  Patient: [name]  Procedure(s) Performed: Procedure(s): BRACHIOCEPHALIC ARTERIOVENOUS (AV) FISTULA CREATION (Right)  Patient Location: PACU  Anesthesia Type:MAC  Level of Consciousness: awake, alert  and oriented  Airway and Oxygen Therapy: Patient Spontanous Breathing  Post-op Pain: mild  Post-op Assessment: Post-op Vital signs reviewed, Patient's Cardiovascular Status Stable, Respiratory Function Stable, Patent Airway, No signs of Nausea or vomiting and Pain level controlled  Post-op Vital Signs: stable  Last Vitals:  Filed Vitals:   04/30/14 1150  BP: 127/63  Pulse: 56  Temp: 37 C  Resp: 18    Complications: No apparent anesthesia complications

## 2014-04-30 NOTE — Op Note (Signed)
OPERATIVE NOTE   PROCEDURE: right brachiocephalic arteriovenous fistula placement  PRE-OPERATIVE DIAGNOSIS: chronic kidney disease stage IV-V   POST-OPERATIVE DIAGNOSIS: same as above   SURGEON: Adele Barthel, MD  ASSISTANT(S): Silva Bandy, PAC   ANESTHESIA: local and MAC  ESTIMATED BLOOD LOSS: 50 cc  FINDING(S): 1.  Sclerotic cephalic vein 2.  Palpable thrill and faintly palpable radial pulse at end of case  SPECIMEN(S):  none  INDICATIONS:   [name] is a 68 y.o. male who presents with chronic kidney disease stage IV-V.  On his preoperative vein mapping, his veins were sclerotic and small.  On my own exam, I found a marginal right cephalic vein in the right arm.  I discussed with him this was his only chance at a fistula.  The patient is scheduled for right brachiocephalic arteriovenous fistula placement.  The patient is aware the risks include but are not limited to: bleeding, infection, steal syndrome, nerve damage, ischemic monomelic neuropathy, failure to mature, and need for additional procedures.  The patient is aware of the risks of the procedure and elects to proceed forward.  DESCRIPTION: After full informed written consent was obtained from the patient, the patient was brought back to the operating room and placed supine upon the operating table.  Prior to induction, the patient received IV antibiotics.   After obtaining adequate anesthesia, the patient was then prepped and draped in the standard fashion for a right arm access procedure.  I turned my attention first to identifying the patient's cephalic vein and brachial artery.  Using SonoSite guidance, the location of these vessels were marked out on the skin.   At this point, I injected local anesthetic to obtain a field block of the antecubitum.  In total, I injected about 10 mL of a 1:1 mixture of 0.5% Marcaine without epinephrine and 1% lidocaine with epinephrine.  I made a transverse incision at the level of the  antecubitum and dissected through the subcutaneous tissue and fascia to gain exposure of the brachial artery.  This was noted to be 4 mm in diameter externally.  This was dissected out proximally and distally and controlled with vessel loops .  I then dissected out the cephalic vein.  This was noted to be 3-4 mm in diameter externally.  The distal segment of the vein was ligated with a  2-0 silk, and the vein was transected.  The proximal segment was iinterrogated with serial dilators.  The vein accepted up to a 4 mm dilator without any difficulty.  Externally the vein appeared to be thickened and scarred.  I then instilled the heparinized saline into the vein and clamped it.  At this point, I reset my exposure of the brachial artery and placed the artery under tension proximally and distally.  I made an arteriotomy with a #11 blade, and then I extended the arteriotomy with a Potts scissor.  I injected heparinized saline proximal and distal to this arteriotomy.  The vein was then sewn to the artery in an end-to-side configuration with a running stitch of 7-0 Prolene.  Prior to completing this anastomosis, I allowed the vein and artery to backbleed.  There was no evidence of clot from any vessels.  I completed the anastomosis in the usual fashion and then released all vessel loops and clamps.  There was a palpable thrill in the venous outflow, and there was a faintly palpable radial pulse.  On doppler exam, there was <50% augmentation in radial signal with venous  outflow compression.  At this point, I irrigated out the surgical wound.  There was no further active bleeding.  The subcutaneous tissue was reapproximated with a running stitch of 3-0 Vicryl.  The skin was then reapproximated with a running subcuticular stitch of 4-0 Vicryl.  The skin was then cleaned, dried, and reinforced with Dermabond.  The patient tolerated this procedure well.   COMPLICATIONS: none  CONDITION: stable  Adele Barthel, MD [facility] Office: [phone] Pager: [phone]  04/30/2014, 10:44 AM

## 2014-04-30 NOTE — Interval H&P Note (Signed)
[facility] [place]  History and Physical Update  The patient was interviewed and re-examined.  The patient's previous History and Physical has been reviewed and is unchanged from my recent consult.  There is no change in the plan of care: R arm exploration, possible R BC AVF.  The patient is aware that his veins are marginal at best for arteriovenous fistula placement.  Adele Barthel, MD [facility] [place] Office: [phone] Pager: [phone]  04/30/2014, 7:11 AM

## 2014-05-01 ENCOUNTER — Encounter (HOSPITAL_COMMUNITY): Payer: Self-pay | Admitting: Vascular Surgery

## 2014-05-02 ENCOUNTER — Telehealth: Payer: Self-pay | Admitting: Vascular Surgery

## 2014-05-02 NOTE — Telephone Encounter (Addendum)
-----   Message from [name], RN sent at 04/30/2014 10:59 AM EST ----- Regarding: Schedule   ----- Message -----    From: Conrad Ridgway, MD    Sent: 04/30/2014  10:47 AM      To: [facility] [address]  [name] [id number] 1946/03/29  PROCEDURE: right brachiocephalic arteriovenous fistula placement  Asst: Silva Bandy, [facility]   Follow-up: 6 weeks   05/02/14: lm for pt re appt, dpm

## 2014-05-03 ENCOUNTER — Other Ambulatory Visit (HOSPITAL_COMMUNITY): Payer: Self-pay | Admitting: Oncology

## 2014-05-16 ENCOUNTER — Encounter (HOSPITAL_BASED_OUTPATIENT_CLINIC_OR_DEPARTMENT_OTHER): Payer: PRIVATE HEALTH INSURANCE

## 2014-05-16 ENCOUNTER — Encounter (HOSPITAL_COMMUNITY): Payer: Self-pay

## 2014-05-16 ENCOUNTER — Encounter (HOSPITAL_COMMUNITY): Payer: PRIVATE HEALTH INSURANCE | Attending: Hematology & Oncology

## 2014-05-16 DIAGNOSIS — D638 Anemia in other chronic diseases classified elsewhere: Secondary | ICD-10-CM | POA: Diagnosis not present

## 2014-05-16 DIAGNOSIS — C9000 Multiple myeloma not having achieved remission: Secondary | ICD-10-CM

## 2014-05-16 DIAGNOSIS — N185 Chronic kidney disease, stage 5: Secondary | ICD-10-CM | POA: Diagnosis not present

## 2014-05-16 LAB — CBC WITH DIFFERENTIAL/PLATELET
Basophils Absolute: 0 10*3/uL (ref 0.0–0.1)
Basophils Relative: 0 % (ref 0–1)
Eosinophils Absolute: 0.1 10*3/uL (ref 0.0–0.7)
Eosinophils Relative: 2 % (ref 0–5)
HCT: 31.5 % — ABNORMAL LOW (ref 39.0–52.0)
Hemoglobin: 10.5 g/dL — ABNORMAL LOW (ref 13.0–17.0)
LYMPHS ABS: 2.9 10*3/uL (ref 0.7–4.0)
Lymphocytes Relative: 54 % — ABNORMAL HIGH (ref 12–46)
MCH: 34 pg (ref 26.0–34.0)
MCHC: 33.3 g/dL (ref 30.0–36.0)
MCV: 101.9 fL — AB (ref 78.0–100.0)
Monocytes Absolute: 0.2 10*3/uL (ref 0.1–1.0)
Monocytes Relative: 3 % (ref 3–12)
NEUTROS ABS: 2.1 10*3/uL (ref 1.7–7.7)
Neutrophils Relative %: 40 % — ABNORMAL LOW (ref 43–77)
Platelets: 47 10*3/uL — ABNORMAL LOW (ref 150–400)
RBC: 3.09 MIL/uL — AB (ref 4.22–5.81)
RDW: 15.1 % (ref 11.5–15.5)
Smear Review: DECREASED
WBC: 5.3 10*3/uL (ref 4.0–10.5)

## 2014-05-16 LAB — COMPREHENSIVE METABOLIC PANEL
ALBUMIN: 3.9 g/dL (ref 3.5–5.2)
ALT: 16 U/L (ref 0–53)
ANION GAP: 13 (ref 5–15)
AST: 19 U/L (ref 0–37)
Alkaline Phosphatase: 32 U/L — ABNORMAL LOW (ref 39–117)
BUN: 92 mg/dL — ABNORMAL HIGH (ref 6–23)
CALCIUM: 8.8 mg/dL (ref 8.4–10.5)
CO2: 20 mmol/L (ref 19–32)
Chloride: 106 mEq/L (ref 96–112)
Creatinine, Ser: 6.7 mg/dL — ABNORMAL HIGH (ref 0.50–1.35)
GFR calc Af Amer: 9 mL/min — ABNORMAL LOW (ref >90)
GFR calc non Af Amer: 8 mL/min — ABNORMAL LOW (ref >90)
GLUCOSE: 110 mg/dL — AB (ref 70–99)
POTASSIUM: 3.6 mmol/L (ref 3.5–5.1)
SODIUM: 139 mmol/L (ref 135–145)
TOTAL PROTEIN: 6.6 g/dL (ref 6.0–8.3)
Total Bilirubin: 0.5 mg/dL (ref 0.3–1.2)

## 2014-05-16 LAB — C-REACTIVE PROTEIN: CRP: <0.5 mg/dL — ABNORMAL LOW (ref <0.60)

## 2014-05-16 LAB — LACTATE DEHYDROGENASE: LDH: 192 U/L (ref 94–250)

## 2014-05-16 LAB — SEDIMENTATION RATE: Sed Rate: 12 mm/hr (ref 0–16)

## 2014-05-16 MED ORDER — DARBEPOETIN ALFA 500 MCG/ML IJ SOSY
500.0000 ug | PREFILLED_SYRINGE | Freq: Once | INTRAMUSCULAR | Status: AC
  Administered 2014-05-16: 500 ug via SUBCUTANEOUS

## 2014-05-16 MED ORDER — DARBEPOETIN ALFA 500 MCG/ML IJ SOSY
PREFILLED_SYRINGE | INTRAMUSCULAR | Status: AC
  Filled 2014-05-16: qty 1

## 2014-05-16 NOTE — Progress Notes (Signed)
See progress notes

## 2014-05-16 NOTE — Progress Notes (Signed)
[name] presents today for injection per the provider's orders.  Aranesp administration without incident; see MAR for injection details.  Patient tolerated procedure well and without incident.  No questions or complaints noted at this time.

## 2014-05-16 NOTE — Patient Instructions (Signed)
[facility] Discharge Instructions  RECOMMENDATIONS MADE BY THE CONSULTANT AND ANY TEST RESULTS WILL BE SENT TO YOUR REFERRING PHYSICIAN.  Return as scheduled for labs, injections, and office visit.  Thank you for choosing [facility] to provide your oncology and hematology care.  To afford each patient quality time with our providers, please arrive at least 15 minutes before your scheduled appointment time.  With your help, our goal is to use those 15 minutes to complete the necessary work-up to ensure our physicians have the information they need to help with your evaluation and healthcare recommendations.    Effective January 1st, 2014, we ask that you re-schedule your appointment with our physicians should you arrive 10 or more minutes late for your appointment.  We strive to give you quality time with our providers, and arriving late affects you and other patients whose appointments are after yours.    Again, thank you for choosing [facility].  Our hope is that these requests will decrease the amount of time that you wait before being seen by our physicians.       _____________________________________________________________  Should you have questions after your visit to [facility], please contact our office at [phone] between the hours of 8:30 a.m. and 4:30 p.m.  Voicemails left after 4:30 p.m. will not be returned until the following business day.  For prescription refill requests, have your pharmacy contact our office with your prescription refill request.    _______________________________________________________________  We hope that we have given you very good care.  You may receive a patient satisfaction survey in the mail, please complete it and return it as soon as possible.  We value your feedback!  _______________________________________________________________  Have you asked about our STAR program?  STAR  stands for Survivorship Training and Rehabilitation, and this is a nationally recognized cancer care program that focuses on survivorship and rehabilitation.  Cancer and cancer treatments may cause problems, such as, pain, making you feel tired and keeping you from doing the things that you need or want to do. Cancer rehabilitation can help. Our goal is to reduce these troubling effects and help you have the best quality of life possible.  You may receive a survey from a nurse that asks questions about your current state of health.  Based on the survey results, all eligible patients will be referred to the [facility] program for an evaluation so we can better serve you!  A frequently asked questions sheet is available upon request.

## 2014-05-16 NOTE — Progress Notes (Signed)
0915 Called to lab station. Lab tech unable to draw labs. Upon talking with patient reports he is feeling "weird". Patient was pale, beginning to be diaphoretic. Patient reports he did not eat he was fasting for blood work. VS check, patient hypotensive. Gave patient ginger ale and peanut butter crackers. Remained hypotensive. Apx 15 mins later VSS, patient feels ok, diaphoresis has stopped. Had patient wait in lobby until lab test resulted. Meriel Flavors notified of patients condition. No Lyndy Russman orders received.

## 2014-05-17 LAB — KAPPA/LAMBDA LIGHT CHAINS
KAPPA, LAMDA LIGHT CHAIN RATIO: 2.53 — AB (ref 0.26–1.65)
Kappa free light chain: 7.98 mg/dL — ABNORMAL HIGH (ref 0.33–1.94)
Lambda free light chains: 3.16 mg/dL — ABNORMAL HIGH (ref 0.57–2.63)

## 2014-05-18 DIAGNOSIS — Z992 Dependence on renal dialysis: Secondary | ICD-10-CM

## 2014-05-18 HISTORY — DX: Dependence on renal dialysis: Z99.2

## 2014-05-21 LAB — MULTIPLE MYELOMA PANEL, SERUM
ALPHA-2-GLOBULIN: 9.2 % (ref 7.1–11.8)
Albumin ELP: 63.2 % (ref 55.8–66.1)
Alpha-1-Globulin: 4.8 % (ref 2.9–4.9)
BETA 2: 4.1 % (ref 3.2–6.5)
BETA GLOBULIN: 5.8 % (ref 4.7–7.2)
GAMMA GLOBULIN: 12.9 % (ref 11.1–18.8)
IgA: 102 mg/dL (ref 68–379)
IgG (Immunoglobin G), Serum: 774 mg/dL (ref 650–1600)
IgM, Serum: 89 mg/dL (ref 41–251)
M-SPIKE, %: NOT DETECTED g/dL
Total Protein: 6.6 g/dL (ref 6.0–8.3)

## 2014-05-21 LAB — BETA 2 MICROGLOBULIN, SERUM: Beta-2 Microglobulin: 18.7 mg/L — ABNORMAL HIGH (ref <=2.51)

## 2014-06-04 ENCOUNTER — Other Ambulatory Visit (HOSPITAL_COMMUNITY): Payer: Self-pay | Admitting: Oncology

## 2014-06-04 DIAGNOSIS — C9000 Multiple myeloma not having achieved remission: Secondary | ICD-10-CM

## 2014-06-04 MED ORDER — POMALIDOMIDE 1 MG PO CAPS: ORAL_CAPSULE | ORAL | Status: DC

## 2014-06-06 ENCOUNTER — Encounter (HOSPITAL_COMMUNITY): Payer: Medicare Other | Attending: Hematology & Oncology

## 2014-06-06 ENCOUNTER — Encounter (HOSPITAL_BASED_OUTPATIENT_CLINIC_OR_DEPARTMENT_OTHER): Payer: Medicare Other

## 2014-06-06 ENCOUNTER — Encounter (HOSPITAL_COMMUNITY): Payer: Self-pay

## 2014-06-06 VITALS — BP 162/81 | HR 61 | Temp 98.2°F | Resp 18

## 2014-06-06 DIAGNOSIS — N185 Chronic kidney disease, stage 5: Secondary | ICD-10-CM | POA: Insufficient documentation

## 2014-06-06 DIAGNOSIS — D631 Anemia in chronic kidney disease: Secondary | ICD-10-CM | POA: Diagnosis not present

## 2014-06-06 DIAGNOSIS — C9000 Multiple myeloma not having achieved remission: Secondary | ICD-10-CM | POA: Insufficient documentation

## 2014-06-06 DIAGNOSIS — N189 Chronic kidney disease, unspecified: Secondary | ICD-10-CM

## 2014-06-06 DIAGNOSIS — I10 Essential (primary) hypertension: Secondary | ICD-10-CM | POA: Insufficient documentation

## 2014-06-06 LAB — CBC
HEMATOCRIT: 31 % — AB (ref 39.0–52.0)
Hemoglobin: 10.6 g/dL — ABNORMAL LOW (ref 13.0–17.0)
MCH: 34.6 pg — ABNORMAL HIGH (ref 26.0–34.0)
MCHC: 34.2 g/dL (ref 30.0–36.0)
MCV: 101.3 fL — ABNORMAL HIGH (ref 78.0–100.0)
PLATELETS: 36 10*3/uL — AB (ref 150–400)
RBC: 3.06 MIL/uL — ABNORMAL LOW (ref 4.22–5.81)
RDW: 14.9 % (ref 11.5–15.5)
WBC: 6.2 10*3/uL (ref 4.0–10.5)

## 2014-06-06 MED ORDER — DARBEPOETIN ALFA 100 MCG/0.5ML IJ SOSY
PREFILLED_SYRINGE | INTRAMUSCULAR | Status: AC
  Filled 2014-06-06: qty 0.5

## 2014-06-06 MED ORDER — DARBEPOETIN ALFA 100 MCG/0.5ML IJ SOSY
50.0000 ug | PREFILLED_SYRINGE | Freq: Once | INTRAMUSCULAR | Status: AC
  Administered 2014-06-06: 50 ug via SUBCUTANEOUS

## 2014-06-06 NOTE — Patient Instructions (Signed)
[place] at [facility] Discharge Instructions  RECOMMENDATIONS MADE BY THE CONSULTANT AND ANY TEST RESULTS WILL BE SENT TO YOUR REFERRING PHYSICIAN.  Today you received Aranesp injection. Return as scheduled for office visit.   Thank you for choosing [place] at [facility] to provide your oncology and hematology care.  To afford each patient quality time with our provider, please arrive at least 15 minutes before your scheduled appointment time.    You need to re-schedule your appointment should you arrive 10 or more minutes late.  We strive to give you quality time with our providers, and arriving late affects you and other patients whose appointments are after yours.  Also, if you no show three or more times for appointments you may be dismissed from the clinic at the providers discretion.     Again, thank you for choosing [facility].  Our hope is that these requests will decrease the amount of time that you wait before being seen by our physicians.       _____________________________________________________________  Should you have questions after your visit to [facility], please contact our office at [phone] between the hours of 8:30 a.m. and 4:30 p.m.  Voicemails left after 4:30 p.m. will not be returned until the following business day.  For prescription refill requests, have your pharmacy contact our office.

## 2014-06-06 NOTE — Progress Notes (Signed)
Labs for cbc,ferr

## 2014-06-06 NOTE — Progress Notes (Signed)
1050:  WORTH KOBER presents today for injection per the provider's orders.  Aranesp 50 mcg administration without incident; see MAR for injection details.  Patient tolerated procedure well and without incident.  No questions or complaints noted at this time.

## 2014-06-07 LAB — FERRITIN: Ferritin: 240 ng/mL (ref 22–322)

## 2014-06-11 ENCOUNTER — Ambulatory Visit (HOSPITAL_COMMUNITY): Payer: Medicaid Other

## 2014-06-11 ENCOUNTER — Encounter (HOSPITAL_BASED_OUTPATIENT_CLINIC_OR_DEPARTMENT_OTHER): Payer: Medicare Other | Admitting: Hematology & Oncology

## 2014-06-11 ENCOUNTER — Encounter: Payer: Self-pay | Admitting: Family

## 2014-06-11 VITALS — BP 151/73 | HR 64 | Temp 98.2°F | Resp 20 | Wt 246.3 lb

## 2014-06-11 DIAGNOSIS — N185 Chronic kidney disease, stage 5: Secondary | ICD-10-CM

## 2014-06-11 DIAGNOSIS — Z9484 Stem cells transplant status: Secondary | ICD-10-CM

## 2014-06-11 DIAGNOSIS — C9000 Multiple myeloma not having achieved remission: Secondary | ICD-10-CM | POA: Diagnosis not present

## 2014-06-11 DIAGNOSIS — R0683 Snoring: Secondary | ICD-10-CM

## 2014-06-11 DIAGNOSIS — R5383 Other fatigue: Secondary | ICD-10-CM | POA: Diagnosis not present

## 2014-06-11 DIAGNOSIS — Z992 Dependence on renal dialysis: Secondary | ICD-10-CM

## 2014-06-11 NOTE — Progress Notes (Signed)
Gregory Kilts, MD [place] [address]  DIAGNOSIS:  Multiple myeloma 01/13/2005 after presenting with groin pain, nausea, vomiting, loss of appetite and renal failure with hyperkalemia and anemia.  Bone Marrow - 10-15% atypical plasma cells  Urine protein: 5.79 gm/gm creat. UPEP 94% and UFIX free kappa light chains. Serum protein electrophoresis showed 0.41 gm/dl of kappa light chain. Beta II microglobulin (01/14/05): 12,587 mcg/ml (12.6). BUN/Creat. 103 / 6.5 . At the time of his diagnosis in 2006 he also underwent a cervical laminectomy of C-4, 5, 6, and 7 because of a mass causing cervical cord compression. He underwent fusion of C-4, 5, 6, 7 and T-1 with vertex rod instrumentation and morselized autograph fusion.  Chronic renal disease, stage 5, glomerular filtration rate less than or equal to 15 mL/min/1.73 square meter  Followed by Dr. Marcell Anger at [facility], s/p 2 autologous BM transplants.  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 Marcell Anger at [facility] restarted on September 05, 2012, 7 days on and 7 days off. Dexamethasone D/C'd due to patient reported intolerance.    Anaphylactic reaction to bisphosphonate therapy, D/C'd   CURRENT THERAPY: Pomalyst every other day x 14 days followed by 7 day respite, then repeated. Dexamethasone D/C'd due to patient reported intolerance.  Aranesp 500 mcg every 21 days for Hgb less than or equal to 11 g/dL.   INTERVAL HISTORY: [name] 69 y.o. male returns for follow-up of his multiple myeloma. He was on pomalyst 1 mg every other day. He has discontinued this because he states he simply felt too weak and tired. He reports a noticeable improvement in his energy level since discontinuing his medication. He has recently undergone "shunt placement" in anticipation of hemodialysis. He has not been back to [facility] for his myeloma since Dr. Marcell Anger retired.  He has a hard time dealing with his chronic illness.  He does not like the limitations that it imposes on him. He states he is frustrated where his shunt was placed in his right arm because he can no longer lift anything heavier than a gallon of milk. He looks remarkably well considering his multiple comorbidities.  MEDICAL HISTORY: Past Medical History  Diagnosis Date  . Hypertension   . Ruptured cervical disc   . Fall resulting in striking against other object     paralyzed  . Multiple myeloma   . 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 Multiple myeloma 01/26/2011  . Unspecified vitamin D deficiency 03/06/2013  . Anemia of chronic disease 12/30/2013  . Chronic renal disease, stage 5, glomerular filtration rate less than or equal to 15 mL/min/1.73 square meter 12/30/2013  . Constipation     has ESSENTIAL HYPERTENSION, BENIGN; BRADYCARDIA; Multiple myeloma; Intravenous pamidronate causing adverse effect in therapeutic use; Unspecified vitamin D deficiency; Lymphedema of lower extremity; Anemia of chronic disease; Chronic renal disease, stage 5, glomerular filtration rate less than or equal to 15 mL/min/1.73 square meter; Pathologic fracture; and Anemia in chronic kidney disease on his problem list.     is allergic to pamidronate.  Mr. Dematteo does not currently have medications on file.  SURGICAL HISTORY: Past Surgical History  Procedure Laterality Date  . Inguinal hernia repair Bilateral   . Anterior cervical decomp/discectomy fusion    . Bone marrow aspirate and biopsy wiith lumbar puncture    . Melanoma excision      rt. breast  . Cervical spine surgery    .  Kyphoplasty Bilateral 01/30/2014    Procedure: Thoracic eleven Kyphoplasty;  Surgeon: Consuella Lose, MD;  Location: [facility];  Service: Neurosurgery;  Laterality: Bilateral;  Thoracic eleven Kyphoplasty  . Av fistula placement Right 04/30/2014    Procedure: BRACHIOCEPHALIC ARTERIOVENOUS (AV) FISTULA CREATION;  Surgeon: Conrad Girard, MD;   Location: [facility];  Service: Vascular;  Laterality: Right;    SOCIAL HISTORY: History   Social History  . Marital Status: Divorced    Spouse Name: N/A  . Number of Children: N/A  . Years of Education: N/A   Occupational History  . Not on file.   Social History Main Topics  . Smoking status: Never Smoker   . Smokeless tobacco: Never Used  . Alcohol Use: No  . Drug Use: No  . Sexual Activity: Not on file   Other Topics Concern  . Not on file   Social History Narrative    FAMILY HISTORY: Family History  Problem Relation Age of Onset  . Cancer Sister   . Cancer Brother     Review of Systems  Constitutional: Positive for malaise/fatigue.  Eyes: Negative.   Respiratory: Negative.   Cardiovascular: Negative.   Gastrointestinal: Negative.   Genitourinary: Negative.   Musculoskeletal: Positive for joint pain and neck pain.  Skin: Negative.   Neurological: Positive for weakness. Negative for dizziness, tingling, seizures and loss of consciousness.  Endo/Heme/Allergies: Negative.   Psychiatric/Behavioral: Negative.     PHYSICAL EXAMINATION  ECOG PERFORMANCE STATUS: 1 - Symptomatic but completely ambulatory  Filed Vitals:   06/11/14 1400  BP: 151/73  Pulse: 64  Temp: 98.2 F (36.8 C)  Resp: 20    Physical Exam  Constitutional: He is oriented to person, place, and time and well-developed, well-nourished, and in no distress.  HENT:  Head: Normocephalic and atraumatic.  Nose: Nose normal.  Mouth/Throat: Oropharynx is clear and moist. No oropharyngeal exudate.  Eyes: Conjunctivae and EOM are normal. Pupils are equal, round, and reactive to light. Right eye exhibits no discharge. Left eye exhibits no discharge. No scleral icterus.  Neck: Normal range of motion. Neck supple. No tracheal deviation present. No thyromegaly present.  Cardiovascular: Normal rate, regular rhythm and normal heart sounds.  Exam reveals no gallop and no friction rub.   No murmur  heard. Pulmonary/Chest: Effort normal and breath sounds normal. He has no wheezes. He has no rales.  Abdominal: Soft. Bowel sounds are normal. He exhibits no distension and no mass. There is no tenderness. There is no rebound and no guarding.  Musculoskeletal: Normal range of motion. He exhibits no edema.  Lymphadenopathy:    He has no cervical adenopathy.  Neurological: He is alert and oriented to person, place, and time. He has normal reflexes. No cranial nerve deficit. Gait normal. Coordination normal.  Skin: Skin is warm and dry. No rash noted.  Psychiatric: Mood, memory, affect and judgment normal.  Nursing note and vitals reviewed.   LABORATORY DATA:  CBC    Component Value Date/Time   WBC 6.9 06/22/2014 2359   RBC 2.64* 06/22/2014 2359   HGB 8.8* 06/22/2014 2359   HCT 26.9* 06/22/2014 2359   PLT 32* 06/22/2014 2359   MCV 101.9* 06/22/2014 2359   MCH 33.3 06/22/2014 2359   MCHC 32.7 06/22/2014 2359   RDW 14.9 06/22/2014 2359   LYMPHSABS 2.3 06/22/2014 2359   MONOABS 0.3 06/22/2014 2359   EOSABS 0.1 06/22/2014 2359   BASOSABS 0.0 06/22/2014 2359   CMP     Component Value  Date/Time   NA 137 06/22/2014 2359   K 4.6 06/22/2014 2359   CL 113* 06/22/2014 2359   CO2 17* 06/22/2014 2359   GLUCOSE 125* 06/22/2014 2359   BUN 111* 06/22/2014 2359   CREATININE 6.53* 06/22/2014 2359   CREATININE 43.09* 01/26/2012 1052   CALCIUM 7.9* 06/22/2014 2359   PROT 6.8 06/22/2014 2359   ALBUMIN 3.6 06/22/2014 2359   AST 21 06/22/2014 2359   ALT 27 06/22/2014 2359   ALKPHOS 41 06/22/2014 2359   BILITOT 0.5 06/22/2014 2359   GFRNONAA 8* 06/22/2014 2359   GFRAA 9* 06/22/2014 2359      ASSESSMENT and THERAPY PLAN:  IMPRESSION AND PLAN: Kappa light chain multiple myeloma  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  He has discontinued pomalyst secondary to severe fatigue.  He is being prepared for hemodialysis.   I discussed with Flor that I want him to consider going back to [facility] to establish with Dr. Marjory Lies replacement. He has multiple concerns about his myeloma recurring but it not interested currently in continuing therapy. We discussed new options for treatment that have recently received FDA approval.   I have recommended continuing his aranesp at renal dosing. He is to notify us if he begins dialysis prior to his follow-up.  We will see him back in 2 months with labs. Sooner if needed.     All questions were answered. The patient knows to call the clinic with any problems, questions or concerns. We can certainly see the patient much sooner if necessary.   Molli Hazard 06/29/2014

## 2014-06-11 NOTE — Patient Instructions (Signed)
[place] at [facility]  Discharge Instructions:  Labs every 3 weeks.  Continue Aranesp every 3 weeks. Referral for sleep study. Referral to [facility] to see Doctor who took over Dr. Marjory Lies clinic for recommendations regarding future treatment options. Return in 2 months for follow-up. _______________________________________________________________  Thank you for choosing [place] at [facility] to provide your oncology and hematology care.  To afford each patient quality time with our providers, please arrive at least 15 minutes before your scheduled appointment.  You need to re-schedule your appointment if you arrive 10 or more minutes late.  We strive to give you quality time with our providers, and arriving late affects you and other patients whose appointments are after yours.  Also, if you no show three or more times for appointments you may be dismissed from the clinic.  Again, thank you for choosing [place] at [place] hope is that these requests will allow you access to exceptional care and in a timely manner. _______________________________________________________________  If you have questions after your visit, please contact our office at [phone] between the hours of 8:30 a.m. and 5:00 p.m. Voicemails left after 4:30 p.m. will not be returned until the following business day. _______________________________________________________________  For prescription refill requests, have your pharmacy contact our office. _______________________________________________________________  Recommendations made by the consultant and any test results will be sent to your referring physician. _______________________________________________________________

## 2014-06-13 ENCOUNTER — Ambulatory Visit (INDEPENDENT_AMBULATORY_CARE_PROVIDER_SITE_OTHER): Payer: Self-pay | Admitting: Vascular Surgery

## 2014-06-13 ENCOUNTER — Encounter: Payer: Self-pay | Admitting: Vascular Surgery

## 2014-06-13 VITALS — BP 159/80 | HR 65 | Temp 98.2°F | Resp 18 | Ht 71.0 in | Wt 238.0 lb

## 2014-06-13 DIAGNOSIS — Z4931 Encounter for adequacy testing for hemodialysis: Secondary | ICD-10-CM

## 2014-06-13 DIAGNOSIS — N185 Chronic kidney disease, stage 5: Secondary | ICD-10-CM

## 2014-06-13 NOTE — Progress Notes (Signed)
    Postoperative Access Visit   History of Present Illness  [name] is a 69 y.o. year old male who presents for postoperative follow-up for: R BC AVF (Date: 04/30/14).  The patient's wounds are healed.  The patient notes no steal symptoms.  The patient is able to complete their activities of daily living.  The patient's current symptoms are: R thumb numbness.  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  Pt also unhappy about instruction not to life >15 lb.  For VQI Use Only  PRE-ADM LIVING: Home  AMB STATUS: Ambulatory  Physical Examination Filed Vitals:   06/13/14 1123  BP: 159/80  Pulse: 65  Temp: 98.2 F (36.8 C)  Resp: 18    RUE: Incision is healed, skin feels warm, hand grip is 5/5, sensation in digits is intact, palpable thrill, bruit can be auscultated , palpable radial pulse, on Sonosite > 6 mm throughout  Medical Decision Making  [name] is a 69 y.o. year old male who presents s/p R BC AVF.  The patient's access is ready for use.  Pt can lift whatever weight desired with R hand.  The weight restriction only applies to the first 2 weeks post-op.  I doubt this patient has steal syndrome with a strong radial pulse in the right wrist.    The radial nerve is also no where near the surgical site for a BC AVF.  I suspect his sx are due to his prior chemotherapy.  Thank you for allowing [place] to participate in this patient's care.  Adele Barthel, MD [facility] [place] Office: [phone] Pager: [phone]  06/13/2014, 11:55 AM

## 2014-06-15 ENCOUNTER — Encounter: Payer: PRIVATE HEALTH INSURANCE | Admitting: Vascular Surgery

## 2014-06-19 DIAGNOSIS — R5383 Other fatigue: Secondary | ICD-10-CM | POA: Diagnosis not present

## 2014-06-21 DIAGNOSIS — Z6835 Body mass index (BMI) 35.0-35.9, adult: Secondary | ICD-10-CM | POA: Diagnosis not present

## 2014-06-21 DIAGNOSIS — R252 Cramp and spasm: Secondary | ICD-10-CM | POA: Diagnosis not present

## 2014-06-22 ENCOUNTER — Emergency Department (HOSPITAL_COMMUNITY)
Admission: EM | Admit: 2014-06-22 | Discharge: 2014-06-23 | Disposition: A | Discharge status: Home / Self Care | Payer: Medicare Other | Attending: Emergency Medicine | Admitting: Emergency Medicine

## 2014-06-22 ENCOUNTER — Encounter (HOSPITAL_COMMUNITY): Payer: Self-pay

## 2014-06-22 ENCOUNTER — Ambulatory Visit: Payer: Medicare Other | Attending: Oncology | Admitting: Sleep Medicine

## 2014-06-22 ENCOUNTER — Encounter: Payer: Self-pay | Admitting: Nephrology

## 2014-06-22 DIAGNOSIS — R0602 Shortness of breath: Secondary | ICD-10-CM | POA: Diagnosis not present

## 2014-06-22 DIAGNOSIS — N185 Chronic kidney disease, stage 5: Secondary | ICD-10-CM

## 2014-06-22 DIAGNOSIS — Z8739 Personal history of other diseases of the musculoskeletal system and connective tissue: Secondary | ICD-10-CM | POA: Insufficient documentation

## 2014-06-22 DIAGNOSIS — E559 Vitamin D deficiency, unspecified: Secondary | ICD-10-CM | POA: Diagnosis not present

## 2014-06-22 DIAGNOSIS — Z8579 Personal history of other malignant neoplasms of lymphoid, hematopoietic and related tissues: Secondary | ICD-10-CM | POA: Insufficient documentation

## 2014-06-22 DIAGNOSIS — G4730 Sleep apnea, unspecified: Secondary | ICD-10-CM | POA: Diagnosis not present

## 2014-06-22 DIAGNOSIS — J9801 Acute bronchospasm: Secondary | ICD-10-CM | POA: Diagnosis not present

## 2014-06-22 DIAGNOSIS — J811 Chronic pulmonary edema: Secondary | ICD-10-CM | POA: Diagnosis not present

## 2014-06-22 DIAGNOSIS — Z87828 Personal history of other (healed) physical injury and trauma: Secondary | ICD-10-CM | POA: Insufficient documentation

## 2014-06-22 DIAGNOSIS — R0683 Snoring: Secondary | ICD-10-CM | POA: Diagnosis not present

## 2014-06-22 DIAGNOSIS — Z8719 Personal history of other diseases of the digestive system: Secondary | ICD-10-CM | POA: Insufficient documentation

## 2014-06-22 DIAGNOSIS — D539 Nutritional anemia, unspecified: Secondary | ICD-10-CM | POA: Insufficient documentation

## 2014-06-22 DIAGNOSIS — I120 Hypertensive chronic kidney disease with stage 5 chronic kidney disease or end stage renal disease: Secondary | ICD-10-CM | POA: Diagnosis not present

## 2014-06-22 DIAGNOSIS — Z79899 Other long term (current) drug therapy: Secondary | ICD-10-CM | POA: Insufficient documentation

## 2014-06-22 DIAGNOSIS — R531 Weakness: Secondary | ICD-10-CM | POA: Diagnosis not present

## 2014-06-22 DIAGNOSIS — R5383 Other fatigue: Secondary | ICD-10-CM

## 2014-06-22 MED ORDER — IPRATROPIUM-ALBUTEROL 0.5-2.5 (3) MG/3ML IN SOLN
3.0000 mL | Freq: Once | RESPIRATORY_TRACT | Status: AC
  Administered 2014-06-23: 3 mL via RESPIRATORY_TRACT
  Filled 2014-06-22: qty 3

## 2014-06-22 NOTE — ED Provider Notes (Signed)
CSN: [id number]     Arrival date & time 06/22/14  2331 History  This chart was scribed for Gregory Fuel, MD by Chester Holstein, ED Scribe. This patient was seen in room [facility]06/[facility]06 and the patient's care was started at 11:46 PM.    Chief Complaint  Patient presents with  . Shortness of Breath    Patient is a 69 y.o. male presenting with shortness of breath. The history is provided by the patient. No language interpreter was used.  Shortness of Breath Associated symptoms: no fever     HPI Comments: TROOPER OLANDER is a 69 y.o. male with h/o HTN, anemia of chronic disease, chronic renal disease stage 5, recurrent myeloma, and ruptured cervical disc, who presents to the Emergency Department complaining of SOB with onset PTA.  Pt was upstairs for a sleep study. Pt went to restroom and returned with changes in stats.  Pt's O2 saturation was at 82%. Pt notes associated weakness and wheezing.  Pt denies chest pressure and fever.    Past Medical History  Diagnosis Date  . Hypertension   . Ruptured cervical disc   . Fall resulting in striking against other object     paralyzed  . Multiple myeloma   . 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 Multiple myeloma 01/26/2011  . Unspecified vitamin D deficiency 03/06/2013  . Anemia of chronic disease 12/30/2013  . Chronic renal disease, stage 5, glomerular filtration rate less than or equal to 15 mL/min/1.73 square meter 12/30/2013  . Constipation    Past Surgical History  Procedure Laterality Date  . Inguinal hernia repair Bilateral   . Anterior cervical decomp/discectomy fusion    . Bone marrow aspirate and biopsy wiith lumbar puncture    . Melanoma excision      rt. breast  . Cervical spine surgery    . Kyphoplasty Bilateral 01/30/2014    Procedure: Thoracic eleven Kyphoplasty;  Surgeon: Consuella Lose, MD;  Location: [facility];  Service: Neurosurgery;  Laterality: Bilateral;  Thoracic eleven Kyphoplasty  . Av  fistula placement Right 04/30/2014    Procedure: BRACHIOCEPHALIC ARTERIOVENOUS (AV) FISTULA CREATION;  Surgeon: Conrad Tangier, MD;  Location: [facility];  Service: Vascular;  Laterality: Right;   Family History  Problem Relation Age of Onset  . Cancer Sister   . Cancer Brother    History  Substance Use Topics  . Smoking status: Never Smoker   . Smokeless tobacco: Never Used  . Alcohol Use: No    Review of Systems  Constitutional: Negative for fever.  Respiratory: Positive for shortness of breath. Negative for chest tightness.   All other systems reviewed and are negative.     Allergies  Pamidronate  Home Medications   Prior to Admission medications   Medication Sig Start Date End Date Taking? Authorizing Provider  calcium carbonate (TUMS - DOSED IN MG ELEMENTAL CALCIUM) 500 MG chewable tablet Chew 2 tablets by mouth daily.    Historical Provider, MD  Cholecalciferol (VITAMIN D-3) 1000 UNITS CAPS Take 1,000 Units by mouth daily.     Historical Provider, MD  docusate sodium (COLACE) 100 MG capsule Take 100 mg by mouth 2 (two) times daily.    Historical Provider, MD  metoprolol succinate (TOPROL XL) 25 MG 24 hr tablet Take 25 mg by mouth daily.  12/18/13   Historical Provider, MD  NIFEdipine (PROCARDIA XL) 60 MG 24 hr tablet Take 60 mg by mouth daily. 60 mg am and  30 mg at night 12/21/13 12/21/14  Historical Provider, MD  omega-3 acid ethyl esters (LOVAZA) 1 G capsule Take 1 g by mouth 2 (two) times daily.    Historical Provider, MD  oxyCODONE-acetaminophen (ROXICET) 5-325 MG per tablet Take 1 tablet by mouth every 6 (six) hours as needed for severe pain. 04/30/14   Alvia Grove, PA-C  pomalidomide (POMALYST) 1 MG capsule Take 1 tablet every other day 14 days and then 7 days off, then repeat 06/04/14   Baird Cancer, PA-C  torsemide (DEMADEX) 20 MG tablet Take 40 mg by mouth daily as needed (swelling/fluid buildup).  09/07/13   Historical Provider, MD   There were no vitals taken for  this visit. Physical Exam  Constitutional: He is oriented to person, place, and time. He appears well-developed and well-nourished.  Appears dyspneic  HENT:  Head: Normocephalic.  Eyes: Conjunctivae are normal. Pupils are equal, round, and reactive to light.  Neck: Normal range of motion. Neck supple.  Cardiovascular: Normal rate, regular rhythm and normal heart sounds.   No murmur heard. Pulmonary/Chest: Effort normal. No respiratory distress. He has no rales. He exhibits no tenderness.  Diminished breath sounds with faint expiratory wheezes diffusely  Abdominal: Soft. Bowel sounds are normal. He exhibits no distension and no mass. There is no tenderness.  Musculoskeletal: Normal range of motion.  2+ pretibial edema 2+ presacral edema Moderate venous stasis changes  Neurological: He is alert and oriented to person, place, and time. No cranial nerve deficit. Coordination normal.  Skin: Skin is warm and dry. No rash noted.  Psychiatric: He has a normal mood and affect. His behavior is normal.  Nursing note and vitals reviewed.   ED Course  Procedures (including critical care time) DIAGNOSTIC STUDIES: Oxygen Saturation is 91% on room air, adequate by my interpretation.    COORDINATION OF CARE: 11:51 PM Discussed treatment plan with patient at beside, the patient agrees with the plan and has no further questions at this time.   Labs Review Results for orders placed or performed during the hospital encounter of 06/22/14  Comprehensive metabolic panel  Result Value Ref Range   Sodium 137 135 - 145 mmol/L   Potassium 4.6 3.5 - 5.1 mmol/L   Chloride 113 (H) 96 - 112 mmol/L   CO2 17 (L) 19 - 32 mmol/L   Glucose, Bld 125 (H) 70 - 99 mg/dL   BUN 111 (H) 6 - 23 mg/dL   Creatinine, Ser 6.53 (H) 0.50 - 1.35 mg/dL   Calcium 7.9 (L) 8.4 - 10.5 mg/dL   Total Protein 6.8 6.0 - 8.3 g/dL   Albumin 3.6 3.5 - 5.2 g/dL   AST 21 0 - 37 U/L   ALT 27 0 - 53 U/L   Alkaline Phosphatase 41 39 -  117 U/L   Total Bilirubin 0.5 0.3 - 1.2 mg/dL   GFR calc non Af Amer 8 (L) >90 mL/min   GFR calc Af Amer 9 (L) >90 mL/min   Anion gap 7 5 - 15  CBC with Differential  Result Value Ref Range   WBC 6.9 4.0 - 10.5 K/uL   RBC 2.64 (L) 4.22 - 5.81 MIL/uL   Hemoglobin 8.8 (L) 13.0 - 17.0 g/dL   HCT 26.9 (L) 39.0 - 52.0 %   MCV 101.9 (H) 78.0 - 100.0 fL   MCH 33.3 26.0 - 34.0 pg   MCHC 32.7 30.0 - 36.0 g/dL   RDW 14.9 11.5 - 15.5 %   Platelets  32 (L) 150 - 400 K/uL   Neutrophils Relative % 60 43 - 77 %   Neutro Abs 4.1 1.7 - 7.7 K/uL   Lymphocytes Relative 34 12 - 46 %   Lymphs Abs 2.3 0.7 - 4.0 K/uL   Monocytes Relative 5 3 - 12 %   Monocytes Absolute 0.3 0.1 - 1.0 K/uL   Eosinophils Relative 2 0 - 5 %   Eosinophils Absolute 0.1 0.0 - 0.7 K/uL   Basophils Relative 0 0 - 1 %   Basophils Absolute 0.0 0.0 - 0.1 K/uL  Troponin I  Result Value Ref Range   Troponin I 0.05 (H) <0.031 ng/mL  Brain natriuretic peptide  Result Value Ref Range   B Natriuretic Peptide 2068.0 (H) 0.0 - 100.0 pg/mL   Imaging Review Dg Chest 2 View  06/23/2014   CLINICAL DATA:  Weakness, shortness of breath and wheezing for 2 days.  EXAM: CHEST  2 VIEW  COMPARISON:  01/10/2014  FINDINGS: The heart is mildly enlarged. There is tortuosity and calcification of the thoracic aorta. Peribronchial thickening, increased interstitial markings and Kerley B lines consistent with interstitial edema. No pleural effusions or focal infiltrates. The bony structures are intact. Vertebral augmentation changes noted at T12. Cervical spine fusion hardware noted.  IMPRESSION: Interstitial edema without pleural effusion or focal infiltrate.   Electronically Signed   By: Kalman Jewels M.D.   On: 06/23/2014 00:56     EKG Interpretation   Date/Time:  Friday June 22 2014 23:50:37 EST Ventricular Rate:  80 PR Interval:  192 QRS Duration: 125 QT Interval:  436 QTC Calculation: 503 R Axis:   -25 Text Interpretation:  Sinus rhythm  Left ventricular hypertrophy Prolonged  QT interval When compared with ECG of 12/02/2013, QT has lengthened  Confirmed by [facility]  MD, Nehemiah Mcfarren ([id number]) on 06/22/2014 11:55:46 PM      MDM   Final diagnoses:  Shortness of breath  Renal failure, chronic, stage 5  Macrocytic anemia  Bronchospasm    Dyspnea which seems to be a combination of CHF and bronchospasm. He is given albuterol with ipratropium nebulizer treatment. He has known end-stage renal disease but has not started on dialysis. Creatinine is noted to be at his baseline but he has developed some metabolic acidosis. Chest x-ray shows evidence of CHF. Is given a dose of furosemide and has had a modest diuresis and he is feeling somewhat better. He is ambulated in the ED and showed some mild desaturation but generally tolerated ambulation well. It is felt that he would be able to tolerate going home with high dose of furosemide to try to continue his diuresis. However, he will need to contact his nephrologist as soon as possible since I feel he has probably reached the point where he needs to start on dialysis. She is discharged with prescriptions for albuterol and for furosemide.  I personally performed the services described in this documentation, which was scribed in my presence. The recorded information has been reviewed and is accurate.       Gregory Fuel, MD 46/80/32 1224

## 2014-06-22 NOTE — ED Notes (Signed)
Patient was in a sleep study and started having trouble breathing. Feeling weak per pt. Was seen at [facility] on Thursday and was given a steroid shot and did not get his antibiotic filled.

## 2014-06-23 ENCOUNTER — Emergency Department (HOSPITAL_COMMUNITY): Payer: Medicare Other

## 2014-06-23 DIAGNOSIS — R531 Weakness: Secondary | ICD-10-CM | POA: Diagnosis not present

## 2014-06-23 DIAGNOSIS — J811 Chronic pulmonary edema: Secondary | ICD-10-CM | POA: Diagnosis not present

## 2014-06-23 DIAGNOSIS — N185 Chronic kidney disease, stage 5: Secondary | ICD-10-CM | POA: Diagnosis not present

## 2014-06-23 DIAGNOSIS — I120 Hypertensive chronic kidney disease with stage 5 chronic kidney disease or end stage renal disease: Secondary | ICD-10-CM | POA: Diagnosis not present

## 2014-06-23 DIAGNOSIS — D539 Nutritional anemia, unspecified: Secondary | ICD-10-CM | POA: Diagnosis not present

## 2014-06-23 DIAGNOSIS — J9801 Acute bronchospasm: Secondary | ICD-10-CM | POA: Diagnosis not present

## 2014-06-23 LAB — COMPREHENSIVE METABOLIC PANEL
ALBUMIN: 3.6 g/dL (ref 3.5–5.2)
ALK PHOS: 41 U/L (ref 39–117)
ALT: 27 U/L (ref 0–53)
AST: 21 U/L (ref 0–37)
Anion gap: 7 (ref 5–15)
BUN: 111 mg/dL — AB (ref 6–23)
CHLORIDE: 113 mmol/L — AB (ref 96–112)
CO2: 17 mmol/L — AB (ref 19–32)
CREATININE: 6.53 mg/dL — AB (ref 0.50–1.35)
Calcium: 7.9 mg/dL — ABNORMAL LOW (ref 8.4–10.5)
GFR calc non Af Amer: 8 mL/min — ABNORMAL LOW (ref >90)
GFR, EST AFRICAN AMERICAN: 9 mL/min — AB (ref >90)
Glucose, Bld: 125 mg/dL — ABNORMAL HIGH (ref 70–99)
POTASSIUM: 4.6 mmol/L (ref 3.5–5.1)
Sodium: 137 mmol/L (ref 135–145)
Total Bilirubin: 0.5 mg/dL (ref 0.3–1.2)
Total Protein: 6.8 g/dL (ref 6.0–8.3)

## 2014-06-23 LAB — CBC WITH DIFFERENTIAL/PLATELET
Basophils Absolute: 0 10*3/uL (ref 0.0–0.1)
Basophils Relative: 0 % (ref 0–1)
Eosinophils Absolute: 0.1 10*3/uL (ref 0.0–0.7)
Eosinophils Relative: 2 % (ref 0–5)
HCT: 26.9 % — ABNORMAL LOW (ref 39.0–52.0)
HEMOGLOBIN: 8.8 g/dL — AB (ref 13.0–17.0)
LYMPHS PCT: 34 % (ref 12–46)
Lymphs Abs: 2.3 10*3/uL (ref 0.7–4.0)
MCH: 33.3 pg (ref 26.0–34.0)
MCHC: 32.7 g/dL (ref 30.0–36.0)
MCV: 101.9 fL — ABNORMAL HIGH (ref 78.0–100.0)
MONO ABS: 0.3 10*3/uL (ref 0.1–1.0)
Monocytes Relative: 5 % (ref 3–12)
NEUTROS ABS: 4.1 10*3/uL (ref 1.7–7.7)
Neutrophils Relative %: 60 % (ref 43–77)
Platelets: 32 10*3/uL — ABNORMAL LOW (ref 150–400)
RBC: 2.64 MIL/uL — AB (ref 4.22–5.81)
RDW: 14.9 % (ref 11.5–15.5)
WBC: 6.9 10*3/uL (ref 4.0–10.5)

## 2014-06-23 LAB — BRAIN NATRIURETIC PEPTIDE: B NATRIURETIC PEPTIDE 5: 2068 pg/mL — AB (ref 0.0–100.0)

## 2014-06-23 LAB — TROPONIN I: Troponin I: 0.05 ng/mL — ABNORMAL HIGH (ref <0.031)

## 2014-06-23 MED ORDER — FUROSEMIDE 10 MG/ML IJ SOLN
80.0000 mg | Freq: Once | INTRAMUSCULAR | Status: AC
  Administered 2014-06-23: 80 mg via INTRAVENOUS
  Filled 2014-06-23: qty 8

## 2014-06-23 MED ORDER — FUROSEMIDE 80 MG PO TABS: 80.0000 mg | ORAL_TABLET | Freq: Two times a day (BID) | ORAL | Status: DC

## 2014-06-23 MED ORDER — ALBUTEROL SULFATE HFA 108 (90 BASE) MCG/ACT IN AERS: 2.0000 | INHALATION_SPRAY | RESPIRATORY_TRACT | Status: DC | PRN

## 2014-06-23 NOTE — ED Notes (Signed)
Ambulated patient around the ER. Patient sats 90% when returning to room. Dr. Roxanne Mins notified.

## 2014-06-23 NOTE — Discharge Instructions (Signed)
You need to see your kidney doctor to see if it is time to start dialysis.  Stay on a low salt diet.  Return if symptoms are getting worse.  End-Stage Kidney Disease The kidneys are two organs that lie on either side of the spine between the middle of the back and the front of the abdomen. The kidneys:   Remove wastes and extra water from the blood.   Produce important hormones. These help keep bones strong, regulate blood pressure, and help create red blood cells.   Balance the fluids and chemicals in the blood and tissues. End-stage kidney disease occurs when the kidneys are so damaged that they cannot do their job. When the kidneys cannot do their job, life-threatening problems occur. The body cannot stay clean and strong without the help of the kidneys. In end-stage kidney disease, the kidneys cannot get better.You need a new kidney or treatments to do some of the work healthy kidneys do in order to stay alive. CAUSES  End-stage kidney disease usually occurs when a long-lasting (chronic) kidney disease gets worse. It may also occur after the kidneys are suddenly damaged (acute kidney injury).  SYMPTOMS   Swelling (edema) of the legs, ankles, or feet.   Tiredness (lethargy).   Nausea or vomiting.   Confusion.   Problems with urination, such as:   Decreased urine production.   Frequent urination, especially at night.   Frequent accidents in children who are potty trained.   Muscle twitches and cramps.   Persistent itchiness.   Loss of appetite.   Headaches.   Abnormally dark or light skin.   Numbness in the hands or feet.   Easy bruising.   Frequent hiccups.   Menstruation stops. DIAGNOSIS  Your health care provider will measure your blood pressure and take some tests. These may include:   Urine tests.   Blood tests.   Imaging tests, such as:   An ultrasound exam.   Computed tomography (CT).  A kidney biopsy. TREATMENT  There  are two treatments for end-stage kidney disease:   A procedure that removes toxic wastes from the body (dialysis).   Receiving a new kidney (kidney transplant). Both of these treatments have serious risks and consequences. Your health care provider will help you determine which treatment is best for you based on your health, age, and other factors. In addition to having dialysis or a kidney transplant, you may need to take medicines to control high blood pressure (hypertension) and cholesterol and to decrease phosphorus levels in your blood.  HOME CARE INSTRUCTIONS  Follow your prescribed diet.   Take medicines only as directed by your health care provider.   Do not take any new medicines (prescription, over-the-counter, or nutritional supplements) unless approved by your health care provider. Many medicines can worsen your kidney damage or need to have the dose adjusted.   Keep all follow-up visits as directed by your health care provider. MAKE SURE YOU:  Understand these instructions.  Will watch your condition.  Will get help right away if you are not doing well or get worse. Document Released: 07/25/2003 Document Revised: 09/18/2013 Document Reviewed: 01/01/2012 [facility] Patient Information 2015 [place], [place]. This information is not intended to replace advice given to you by your health care provider. Make sure you discuss any questions you have with your health care provider.  Bronchospasm A bronchospasm is a spasm or tightening of the airways going into the lungs. During a bronchospasm breathing becomes more difficult because the airways  get smaller. When this happens there can be coughing, a whistling sound when breathing (wheezing), and difficulty breathing. Bronchospasm is often associated with asthma, but not all patients who experience a bronchospasm have asthma. CAUSES  A bronchospasm is caused by inflammation or irritation of the airways. The inflammation or irritation  may be triggered by:   Allergies (such as to animals, pollen, food, or mold). Allergens that cause bronchospasm may cause wheezing immediately after exposure or many hours later.   Infection. Viral infections are believed to be the most common cause of bronchospasm.   Exercise.   Irritants (such as pollution, cigarette smoke, strong odors, aerosol sprays, and paint fumes).   Weather changes. Winds increase molds and pollens in the air. Rain refreshes the air by washing irritants out. Cold air may cause inflammation.   Stress and emotional upset.  SIGNS AND SYMPTOMS   Wheezing.   Excessive nighttime coughing.   Frequent or severe coughing with a simple cold.   Chest tightness.   Shortness of breath.  DIAGNOSIS  Bronchospasm is usually diagnosed through a history and physical exam. Tests, such as chest X-rays, are sometimes done to look for other conditions. TREATMENT   Inhaled medicines can be given to open up your airways and help you breathe. The medicines can be given using either an inhaler or a nebulizer machine.  Corticosteroid medicines may be given for severe bronchospasm, usually when it is associated with asthma. HOME CARE INSTRUCTIONS   Always have a plan prepared for seeking medical care. Know when to call your health care provider and local emergency services (911 in the [place] Know where you can access local emergency care.  Only take medicines as directed by your health care provider.  If you were prescribed an inhaler or nebulizer machine, ask your health care provider to explain how to use it correctly. Always use a spacer with your inhaler if you were given one.  It is necessary to remain calm during an attack. Try to relax and breathe more slowly.  Control your home environment in the following ways:   Change your heating and air conditioning filter at least once a month.   Limit your use of fireplaces and wood stoves.  Do not smoke and  do not allow smoking in your home.   Avoid exposure to perfumes and fragrances.   Get rid of pests (such as roaches and mice) and their droppings.   Throw away plants if you see mold on them.   Keep your house clean and dust free.   Replace carpet with wood, tile, or vinyl flooring. Carpet can trap dander and dust.   Use allergy-proof pillows, mattress covers, and box spring covers.   Wash bed sheets and blankets every week in hot water and dry them in a dryer.   Use blankets that are made of polyester or cotton.   Wash hands frequently. SEEK MEDICAL CARE IF:   You have muscle aches.   You have chest pain.   The sputum changes from clear or white to yellow, green, gray, or bloody.   The sputum you cough up gets thicker.   There are problems that may be related to the medicine you are given, such as a rash, itching, swelling, or trouble breathing.  SEEK IMMEDIATE MEDICAL CARE IF:   You have worsening wheezing and coughing even after taking your prescribed medicines.   You have increased difficulty breathing.   You develop severe chest pain. MAKE SURE YOU:  Understand these instructions.  Will watch your condition.  Will get help right away if you are not doing well or get worse. Document Released: 05/07/2003 Document Revised: 05/09/2013 Document Reviewed: 10/24/2012 [facility] Patient Information 2015 [place], [place]. This information is not intended to replace advice given to you by your health care provider. Make sure you discuss any questions you have with your health care provider.   Furosemide tablets What is this medicine? FUROSEMIDE (fyoor [place] se mide) is a diuretic. It helps you make more urine and to lose salt and excess water from your body. This medicine is used to treat high blood pressure, and edema or swelling from heart, kidney, or liver disease. This medicine may be used for other purposes; ask your health care provider or pharmacist if  you have questions. COMMON BRAND NAME(S): Delone, Lasix What should I tell my health care provider before I take this medicine? They need to know if you have any of these conditions: -abnormal blood electrolytes -diarrhea or vomiting -gout -heart disease -kidney disease, small amounts of urine, or difficulty passing urine -liver disease -an unusual or allergic reaction to furosemide, sulfa drugs, other medicines, foods, dyes, or preservatives -pregnant or trying to get pregnant -breast-feeding How should I use this medicine? Take this medicine by mouth with a glass of water. Follow the directions on the prescription label. You may take this medicine with or without food. If it upsets your stomach, take it with food or milk. Do not take your medicine more often than directed. Remember that you will need to pass more urine after taking this medicine. Do not take your medicine at a time of day that will cause you problems. Do not take at bedtime. Talk to your pediatrician regarding the use of this medicine in children. While this drug may be prescribed for selected conditions, precautions do apply. Overdosage: If you think you have taken too much of this medicine contact a poison control center or emergency room at once. NOTE: This medicine is only for you. Do not share this medicine with others. What if I miss a dose? If you miss a dose, take it as soon as you can. If it is almost time for your next dose, take only that dose. Do not take double or extra doses. What may interact with this medicine? -aspirin and aspirin-like medicines -certain antibiotics -chloral hydrate -cisplatin -cyclosporine -digoxin -diuretics -laxatives -lithium -medicines for blood pressure -medicines that relax muscles for surgery -methotrexate -NSAIDs, medicines for pain and inflammation like ibuprofen, naproxen, or indomethacin -phenytoin -steroid medicines like prednisone or cortisone -sucralfate This  list may not describe all possible interactions. Give your health care provider a list of all the medicines, herbs, non-prescription drugs, or dietary supplements you use. Also tell them if you smoke, drink alcohol, or use illegal drugs. Some items may interact with your medicine. What should I watch for while using this medicine? Visit your doctor or health care professional for regular checks on your progress. Check your blood pressure regularly. Ask your doctor or health care professional what your blood pressure should be, and when you should contact him or her. If you are a diabetic, check your blood sugar as directed. You may need to be on a special diet while taking this medicine. Check with your doctor. Also, ask how many glasses of fluid you need to drink a day. You must not get dehydrated. You may get drowsy or dizzy. Do not drive, use machinery, or do anything that needs  mental alertness until you know how this drug affects you. Do not stand or sit up quickly, especially if you are an older patient. This reduces the risk of dizzy or fainting spells. Alcohol can make you more drowsy and dizzy. Avoid alcoholic drinks. This medicine can make you more sensitive to the sun. Keep out of the sun. If you cannot avoid being in the sun, wear protective clothing and use sunscreen. Do not use sun lamps or tanning beds/booths. What side effects may I notice from receiving this medicine? Side effects that you should report to your doctor or health care professional as soon as possible: -blood in urine or stools -dry mouth -fever or chills -hearing loss or ringing in the ears -irregular heartbeat -muscle pain or weakness, cramps -skin rash -stomach upset, pain, or nausea -tingling or numbness in the hands or feet -unusually weak or tired -vomiting or diarrhea -yellowing of the eyes or skin Side effects that usually do not require medical attention (report to your doctor or health care professional if  they continue or are bothersome): -headache -loss of appetite -unusual bleeding or bruising This list may not describe all possible side effects. Call your doctor for medical advice about side effects. You may report side effects to FDA at 1-800-FDA-1088. Where should I keep my medicine? Keep out of the reach of children. Store at room temperature between 15 and 30 degrees C (59 and 86 degrees F). Protect from light. Throw away any unused medicine after the expiration date. NOTE: This sheet is a summary. It may not cover all possible information. If you have questions about this medicine, talk to your doctor, pharmacist, or health care provider.  2015, Elsevier/Gold Standard. (2009-04-22 16:24:50)  Albuterol inhalation aerosol What is this medicine? ALBUTEROL (al [place]) is a bronchodilator. It helps open up the airways in your lungs to make it easier to breathe. This medicine is used to treat and to prevent bronchospasm. This medicine may be used for other purposes; ask your health care provider or pharmacist if you have questions. COMMON BRAND NAME(S): Proair HFA, Proventil, Proventil HFA, Respirol, Ventolin, Ventolin HFA What should I tell my health care provider before I take this medicine? They need to know if you have any of the following conditions: -diabetes -heart disease or irregular heartbeat -high blood pressure -pheochromocytoma -seizures -thyroid disease -an unusual or allergic reaction to albuterol, levalbuterol, sulfites, other medicines, foods, dyes, or preservatives -pregnant or trying to get pregnant -breast-feeding How should I use this medicine? This medicine is for inhalation through the mouth. Follow the directions on your prescription label. Take your medicine at regular intervals. Do not use more often than directed. Make sure that you are using your inhaler correctly. Ask you doctor or health care provider if you have any questions. Talk to your  pediatrician regarding the use of this medicine in children. Special care may be needed. Overdosage: If you think you have taken too much of this medicine contact a poison control center or emergency room at once. NOTE: This medicine is only for you. Do not share this medicine with others. What if I miss a dose? If you miss a dose, use it as soon as you can. If it is almost time for your next dose, use only that dose. Do not use double or extra doses. What may interact with this medicine? -anti-infectives like chloroquine and pentamidine -caffeine -cisapride -diuretics -medicines for colds -medicines for depression or for emotional or psychotic conditions -medicines  for weight loss including some herbal products -methadone -some antibiotics like clarithromycin, erythromycin, levofloxacin, and linezolid -some heart medicines -steroid hormones like dexamethasone, cortisone, hydrocortisone -theophylline -thyroid hormones This list may not describe all possible interactions. Give your health care provider a list of all the medicines, herbs, non-prescription drugs, or dietary supplements you use. Also tell them if you smoke, drink alcohol, or use illegal drugs. Some items may interact with your medicine. What should I watch for while using this medicine? Tell your doctor or health care professional if your symptoms do not improve. Do not use extra albuterol. If your asthma or bronchitis gets worse while you are using this medicine, call your doctor right away. If your mouth gets dry try chewing sugarless gum or sucking hard candy. Drink water as directed. What side effects may I notice from receiving this medicine? Side effects that you should report to your doctor or health care professional as soon as possible: -allergic reactions like skin rash, itching or hives, swelling of the face, lips, or tongue -breathing problems -chest pain -feeling faint or lightheaded, falls -high blood  pressure -irregular heartbeat -fever -muscle cramps or weakness -pain, tingling, numbness in the hands or feet -vomiting Side effects that usually do not require medical attention (report to your doctor or health care professional if they continue or are bothersome): -cough -difficulty sleeping -headache -nervousness or trembling -stomach upset -stuffy or runny nose -throat irritation -unusual taste This list may not describe all possible side effects. Call your doctor for medical advice about side effects. You may report side effects to FDA at 1-800-FDA-1088. Where should I keep my medicine? Keep out of the reach of children. Store at room temperature between 15 and 30 degrees C (59 and 86 degrees F). The contents are under pressure and may burst when exposed to heat or flame. Do not freeze. This medicine does not work as well if it is too cold. Throw away any unused medicine after the expiration date. Inhalers need to be thrown away after the labeled number of puffs have been used or by the expiration date; whichever comes first. Ventolin HFA should be thrown away 12 months after removing from foil pouch. Check the instructions that come with your medicine. NOTE: This sheet is a summary. It may not cover all possible information. If you have questions about this medicine, talk to your doctor, pharmacist, or health care provider.  2015, Elsevier/Gold Standard. (2012-10-20 10:57:17)

## 2014-06-25 ENCOUNTER — Encounter (HOSPITAL_COMMUNITY): Payer: Self-pay

## 2014-06-25 ENCOUNTER — Encounter (HOSPITAL_COMMUNITY): Payer: Medicare Other | Attending: Oncology

## 2014-06-25 ENCOUNTER — Telehealth (HOSPITAL_COMMUNITY): Payer: Self-pay | Admitting: Oncology

## 2014-06-25 DIAGNOSIS — N189 Chronic kidney disease, unspecified: Secondary | ICD-10-CM | POA: Diagnosis not present

## 2014-06-25 DIAGNOSIS — C9000 Multiple myeloma not having achieved remission: Secondary | ICD-10-CM | POA: Insufficient documentation

## 2014-06-25 DIAGNOSIS — D631 Anemia in chronic kidney disease: Secondary | ICD-10-CM

## 2014-06-25 MED ORDER — DARBEPOETIN ALFA 100 MCG/0.5ML IJ SOSY
80.0000 ug | PREFILLED_SYRINGE | Freq: Once | INTRAMUSCULAR | Status: AC
  Administered 2014-06-25: 80 ug via SUBCUTANEOUS
  Filled 2014-06-25: qty 0.5

## 2014-06-25 NOTE — Telephone Encounter (Addendum)
Patient called.  He reported to sleep study the other day, but he was so tired and fatigued and SOB that he was escorted to the ED.  In the ED, he is noted to have an elevated BNP.  He was treated with neubulizers and lasix.  He was discharged.  His Hgb is down to 8.8 g/dL.  His supportive therapy plan is reviewed and altered to 80 mcg of Aranesp every 2 weeks.  He is advised to come to the clinic today for Aranesp.    Shakti Fleer 06/25/2014 12:47 PM

## 2014-06-25 NOTE — Progress Notes (Signed)
Jacqualine Code presents today for injection per MD orders. Aranesp 80 mcg administered SQ in right Abdomen. Administration without incident. Patient tolerated well.

## 2014-06-27 ENCOUNTER — Ambulatory Visit (HOSPITAL_COMMUNITY): Payer: PRIVATE HEALTH INSURANCE | Admitting: Oncology

## 2014-06-27 ENCOUNTER — Ambulatory Visit (HOSPITAL_COMMUNITY): Payer: PRIVATE HEALTH INSURANCE

## 2014-06-27 ENCOUNTER — Encounter (HOSPITAL_COMMUNITY): Payer: Medicare Other

## 2014-06-29 ENCOUNTER — Encounter (HOSPITAL_COMMUNITY): Payer: Self-pay | Admitting: Lab

## 2014-06-29 ENCOUNTER — Encounter (HOSPITAL_COMMUNITY): Payer: Self-pay | Admitting: Hematology & Oncology

## 2014-06-29 NOTE — Progress Notes (Signed)
Appointment made to [facility],  Patient refused to go due to being sick.

## 2014-06-29 NOTE — Assessment & Plan Note (Signed)
69 year old male with multiple myeloma.  He has discontinued pomalyst secondary to severe fatigue.  He is being prepared for hemodialysis.   I discussed with Yandriel that I want him to consider going back to [facility] to establish with Dr. Marjory Lies replacement. He has multiple concerns about his myeloma recurring but it not interested currently in continuing therapy. We discussed new options for treatment that have recently received FDA approval.   I have recommended continuing his aranesp at renal dosing. He is to notify us if he begins dialysis prior to his follow-up.  We will see him back in 2 months with labs. Sooner if needed.

## 2014-07-02 NOTE — Sleep Study (Signed)
Nocturnal polysomnography was aborted after 115 minutes because the patient develop acute dyspnea and was sent to the emergency room.

## 2014-07-03 DIAGNOSIS — N19 Unspecified kidney failure: Secondary | ICD-10-CM | POA: Diagnosis not present

## 2014-07-03 DIAGNOSIS — E8770 Fluid overload, unspecified: Secondary | ICD-10-CM | POA: Diagnosis not present

## 2014-07-03 DIAGNOSIS — Z0000 Encounter for general adult medical examination without abnormal findings: Secondary | ICD-10-CM | POA: Diagnosis not present

## 2014-07-03 DIAGNOSIS — N185 Chronic kidney disease, stage 5: Secondary | ICD-10-CM | POA: Diagnosis not present

## 2014-07-03 DIAGNOSIS — I10 Essential (primary) hypertension: Secondary | ICD-10-CM | POA: Diagnosis not present

## 2014-07-03 DIAGNOSIS — N186 End stage renal disease: Secondary | ICD-10-CM | POA: Diagnosis not present

## 2014-07-09 ENCOUNTER — Other Ambulatory Visit (HOSPITAL_COMMUNITY): Payer: Self-pay

## 2014-07-09 ENCOUNTER — Other Ambulatory Visit (HOSPITAL_COMMUNITY): Payer: Medicare Other

## 2014-07-09 ENCOUNTER — Ambulatory Visit (HOSPITAL_COMMUNITY): Payer: Medicare Other

## 2014-07-10 ENCOUNTER — Ambulatory Visit (HOSPITAL_COMMUNITY): Payer: Medicare Other

## 2014-07-10 ENCOUNTER — Other Ambulatory Visit (HOSPITAL_COMMUNITY): Payer: Medicare Other

## 2014-07-10 DIAGNOSIS — Z992 Dependence on renal dialysis: Secondary | ICD-10-CM | POA: Diagnosis not present

## 2014-07-10 DIAGNOSIS — N186 End stage renal disease: Secondary | ICD-10-CM | POA: Diagnosis not present

## 2014-07-16 ENCOUNTER — Encounter (HOSPITAL_BASED_OUTPATIENT_CLINIC_OR_DEPARTMENT_OTHER): Payer: Medicare Other

## 2014-07-16 VITALS — BP 145/75 | HR 74 | Resp 16

## 2014-07-16 DIAGNOSIS — N185 Chronic kidney disease, stage 5: Secondary | ICD-10-CM

## 2014-07-16 DIAGNOSIS — D631 Anemia in chronic kidney disease: Secondary | ICD-10-CM

## 2014-07-16 DIAGNOSIS — D638 Anemia in other chronic diseases classified elsewhere: Secondary | ICD-10-CM

## 2014-07-16 DIAGNOSIS — N186 End stage renal disease: Secondary | ICD-10-CM | POA: Diagnosis not present

## 2014-07-16 DIAGNOSIS — C9000 Multiple myeloma not having achieved remission: Secondary | ICD-10-CM | POA: Diagnosis not present

## 2014-07-16 LAB — HEMOGLOBIN AND HEMATOCRIT, BLOOD
HCT: 24.2 % — ABNORMAL LOW (ref 39.0–52.0)
Hemoglobin: 7.8 g/dL — ABNORMAL LOW (ref 13.0–17.0)

## 2014-07-16 MED ORDER — EPOETIN ALFA 10000 UNIT/ML IJ SOLN
10000.0000 [IU] | Freq: Once | INTRAMUSCULAR | Status: AC
  Administered 2014-07-16: 10000 [IU] via SUBCUTANEOUS
  Filled 2014-07-16: qty 1

## 2014-07-16 NOTE — Progress Notes (Signed)
07/16/2014 2:47 PM  Per Dr. Whitney Muse, Dr. Lowanda Foster is managing [name] ESA administrations presently.  Dr. Lowanda Foster contacted regarding pt's Hgb/Hct.  Procrit 10,000 unit SQ as telephone order received for administration today.  Dr. Lowanda Foster states that [name] will have his counts rechecked this Thursday and subsequent ESA injections will be arranged through Short Stay if unable to receive Procrit during dialysis.  07/16/2014 4:06 PM  [name] presents today for injection per the provider's orders.  Procrit administration without incident; see MAR for injection details.  Patient tolerated procedure well and without incident.  No questions or complaints noted at this time.  Aware to follow-up with [place] as planned this week.

## 2014-07-16 NOTE — Progress Notes (Signed)
[name]'s reason for visit today are for labs as scheduled per MD orders.  Venipuncture performed with a 23 gauge butterfly needle to L Antecubital.  Jacqualine Code tolerated venipuncture well and without incident; questions were answered and patient was discharged.

## 2014-07-19 DIAGNOSIS — D631 Anemia in chronic kidney disease: Secondary | ICD-10-CM | POA: Diagnosis not present

## 2014-07-19 DIAGNOSIS — N186 End stage renal disease: Secondary | ICD-10-CM | POA: Diagnosis not present

## 2014-07-20 ENCOUNTER — Ambulatory Visit (HOSPITAL_COMMUNITY): Payer: Medicaid Other | Admitting: Hematology & Oncology

## 2014-07-23 ENCOUNTER — Other Ambulatory Visit (HOSPITAL_COMMUNITY): Payer: Medicare Other

## 2014-07-23 ENCOUNTER — Ambulatory Visit (HOSPITAL_COMMUNITY): Payer: Medicare Other

## 2014-07-24 DIAGNOSIS — N186 End stage renal disease: Secondary | ICD-10-CM | POA: Diagnosis not present

## 2014-07-24 DIAGNOSIS — Z992 Dependence on renal dialysis: Secondary | ICD-10-CM | POA: Diagnosis not present

## 2014-07-24 DIAGNOSIS — I871 Compression of vein: Secondary | ICD-10-CM | POA: Diagnosis not present

## 2014-07-24 DIAGNOSIS — T82858D Stenosis of vascular prosthetic devices, implants and grafts, subsequent encounter: Secondary | ICD-10-CM | POA: Diagnosis not present

## 2014-08-03 ENCOUNTER — Telehealth (HOSPITAL_COMMUNITY): Payer: Self-pay | Admitting: Oncology

## 2014-08-03 NOTE — Telephone Encounter (Signed)
[name] just wanted to talk and vent about what's going regarding his multiple myeloma treatment and dialysis.  "I am tired of being week."  Kaya Klausing 08/03/2014 4:16 PM

## 2014-08-06 ENCOUNTER — Other Ambulatory Visit (HOSPITAL_COMMUNITY): Payer: Medicaid Other

## 2014-08-06 ENCOUNTER — Ambulatory Visit (HOSPITAL_COMMUNITY): Payer: Medicaid Other

## 2014-08-08 ENCOUNTER — Ambulatory Visit (HOSPITAL_COMMUNITY): Payer: Medicare Other

## 2014-08-08 ENCOUNTER — Other Ambulatory Visit (HOSPITAL_COMMUNITY): Payer: Medicare Other

## 2014-08-08 ENCOUNTER — Ambulatory Visit (HOSPITAL_COMMUNITY): Payer: Medicare Other | Admitting: Hematology & Oncology

## 2014-08-08 ENCOUNTER — Ambulatory Visit (HOSPITAL_COMMUNITY): Payer: Medicaid Other | Admitting: Hematology & Oncology

## 2014-08-08 DIAGNOSIS — T82858D Stenosis of vascular prosthetic devices, implants and grafts, subsequent encounter: Secondary | ICD-10-CM | POA: Diagnosis not present

## 2014-08-08 DIAGNOSIS — I871 Compression of vein: Secondary | ICD-10-CM | POA: Diagnosis not present

## 2014-08-08 DIAGNOSIS — N186 End stage renal disease: Secondary | ICD-10-CM | POA: Diagnosis not present

## 2014-08-08 DIAGNOSIS — Z992 Dependence on renal dialysis: Secondary | ICD-10-CM | POA: Diagnosis not present

## 2014-08-13 ENCOUNTER — Encounter (HOSPITAL_COMMUNITY): Payer: Self-pay | Admitting: Oncology

## 2014-08-13 ENCOUNTER — Encounter (HOSPITAL_COMMUNITY): Payer: Medicare Other | Attending: Oncology | Admitting: Oncology

## 2014-08-13 ENCOUNTER — Telehealth (HOSPITAL_COMMUNITY): Payer: Self-pay

## 2014-08-13 ENCOUNTER — Ambulatory Visit (HOSPITAL_COMMUNITY): Payer: Medicare Other

## 2014-08-13 ENCOUNTER — Encounter (HOSPITAL_BASED_OUTPATIENT_CLINIC_OR_DEPARTMENT_OTHER): Payer: Medicare Other

## 2014-08-13 VITALS — BP 161/78 | HR 68 | Temp 98.7°F | Resp 18 | Wt 243.7 lb

## 2014-08-13 DIAGNOSIS — C9000 Multiple myeloma not having achieved remission: Secondary | ICD-10-CM | POA: Insufficient documentation

## 2014-08-13 DIAGNOSIS — D72819 Decreased white blood cell count, unspecified: Secondary | ICD-10-CM

## 2014-08-13 DIAGNOSIS — G629 Polyneuropathy, unspecified: Secondary | ICD-10-CM

## 2014-08-13 DIAGNOSIS — D7589 Other specified diseases of blood and blood-forming organs: Secondary | ICD-10-CM

## 2014-08-13 DIAGNOSIS — C184 Malignant neoplasm of transverse colon: Secondary | ICD-10-CM

## 2014-08-13 DIAGNOSIS — Z992 Dependence on renal dialysis: Secondary | ICD-10-CM | POA: Diagnosis not present

## 2014-08-13 DIAGNOSIS — D696 Thrombocytopenia, unspecified: Secondary | ICD-10-CM

## 2014-08-13 LAB — COMPREHENSIVE METABOLIC PANEL
ALT: 18 U/L (ref 0–53)
AST: 19 U/L (ref 0–37)
Albumin: 3.5 g/dL (ref 3.5–5.2)
Alkaline Phosphatase: 36 U/L — ABNORMAL LOW (ref 39–117)
Anion gap: 11 (ref 5–15)
BUN: 39 mg/dL — ABNORMAL HIGH (ref 6–23)
CALCIUM: 8.1 mg/dL — AB (ref 8.4–10.5)
CHLORIDE: 103 mmol/L (ref 96–112)
CO2: 27 mmol/L (ref 19–32)
Creatinine, Ser: 5.12 mg/dL — ABNORMAL HIGH (ref 0.50–1.35)
GFR calc Af Amer: 12 mL/min — ABNORMAL LOW (ref >90)
GFR, EST NON AFRICAN AMERICAN: 11 mL/min — AB (ref >90)
Glucose, Bld: 132 mg/dL — ABNORMAL HIGH (ref 70–99)
Potassium: 3.7 mmol/L (ref 3.5–5.1)
SODIUM: 141 mmol/L (ref 135–145)
Total Bilirubin: 0.6 mg/dL (ref 0.3–1.2)
Total Protein: 6.1 g/dL (ref 6.0–8.3)

## 2014-08-13 LAB — CBC WITH DIFFERENTIAL/PLATELET
Basophils Absolute: 0 10*3/uL (ref 0.0–0.1)
Basophils Relative: 0 % (ref 0–1)
EOS ABS: 0.1 10*3/uL (ref 0.0–0.7)
Eosinophils Relative: 2 % (ref 0–5)
HCT: 22.7 % — ABNORMAL LOW (ref 39.0–52.0)
HEMOGLOBIN: 7.4 g/dL — AB (ref 13.0–17.0)
Lymphocytes Relative: 33 % (ref 12–46)
Lymphs Abs: 1.3 10*3/uL (ref 0.7–4.0)
MCH: 36.8 pg — AB (ref 26.0–34.0)
MCHC: 32.6 g/dL (ref 30.0–36.0)
MCV: 112.9 fL — ABNORMAL HIGH (ref 78.0–100.0)
Monocytes Absolute: 0.3 10*3/uL (ref 0.1–1.0)
Monocytes Relative: 7 % (ref 3–12)
NEUTROS ABS: 2.2 10*3/uL (ref 1.7–7.7)
Neutrophils Relative %: 58 % (ref 43–77)
Platelets: 24 10*3/uL — CL (ref 150–400)
RBC: 2.01 MIL/uL — ABNORMAL LOW (ref 4.22–5.81)
RDW: 16.8 % — ABNORMAL HIGH (ref 11.5–15.5)
WBC: 3.8 10*3/uL — ABNORMAL LOW (ref 4.0–10.5)

## 2014-08-13 LAB — C-REACTIVE PROTEIN

## 2014-08-13 LAB — SEDIMENTATION RATE: Sed Rate: 16 mm/hr (ref 0–16)

## 2014-08-13 LAB — LACTATE DEHYDROGENASE: LDH: 232 U/L (ref 94–250)

## 2014-08-13 MED ORDER — PREGABALIN 75 MG PO CAPS: 75.0000 mg | ORAL_CAPSULE | Freq: Two times a day (BID) | ORAL | Status: DC

## 2014-08-13 NOTE — Progress Notes (Signed)
Labs drawn

## 2014-08-13 NOTE — Progress Notes (Signed)
Gregory Kilts, MD [place] [address]  Multiple myeloma  Thrombocytopenia - Plan: Heparin induced thrombocytopenia pnl, Serotonin release assay, CBC with Differential  Neuropathy - Plan: pregabalin (LYRICA) 75 MG capsule  Macrocytosis - Plan: Folate, Vitamin B12  CURRENT THERAPY:  Pomalyst on hold due to patient's complaint of fatigue.  INTERVAL HISTORY: [name] 69 y.o. male returns for followup of multiple myeloma, S/P 2 autologous BM transplant at [facility] under the guidance of Dr. Marcell Anger (retired) with the second bone marrow transplant occuring in July 2013.  Pomalyst on hold secondary to patient reported fatigue despite significant dose reduction.  Complicated by Stage IV renal disease requiring hemodialysis.  I personally reviewed and went over laboratory results with the patient.  The results are noted within this dictation.  His platelet count is noted to drop to 24,000.  Given that he is on dialysis and receiving Heparin as part of the tx, I have ordered a STAT HIT panel and serotonin release assay today.  His Hgb is stable.  His WBC has dropped and he is now officially leukopenic.  With these lab changes, he needs evaluation at [facility] with Dr. Marjory Lies replacement for multiple myeloma recurrence with bone marrow aspiration and biopsy, will defer to them.  He notes continued radiculopathy from his neck and asks about Lyrica.  I have called our pharmacist and he confirms that the medication is dialyzed with dialysis.  Therefore, I will start him at 75 mg daily.  An Rx is written.  Education regarding thrombocytopenia.  Hematologically, he denies any complaints.  Past Medical History  Diagnosis Date  . Hypertension   . Ruptured cervical disc   . Fall resulting in striking against other object     paralyzed  . Multiple myeloma   . 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 Multiple myeloma 01/26/2011  .  Unspecified vitamin D deficiency 03/06/2013  . Anemia of chronic disease 12/30/2013  . Chronic renal disease, stage 5, glomerular filtration rate less than or equal to 15 mL/min/1.73 square meter 12/30/2013  . Constipation   . Dialysis patient 2016    has ESSENTIAL HYPERTENSION, BENIGN; BRADYCARDIA; Multiple myeloma; Intravenous pamidronate causing adverse effect in therapeutic use; Unspecified vitamin D deficiency; Lymphedema of lower extremity; Anemia of chronic disease; Chronic renal disease, stage 5, glomerular filtration rate less than or equal to 15 mL/min/1.73 square meter; Pathologic fracture; and Anemia in chronic kidney disease on his problem list.     is allergic to pamidronate.  [name] had no medications administered during this visit.  Past Surgical History  Procedure Laterality Date  . Inguinal hernia repair Bilateral   . Anterior cervical decomp/discectomy fusion    . Bone marrow aspirate and biopsy wiith lumbar puncture    . Melanoma excision      rt. breast  . Cervical spine surgery    . Kyphoplasty Bilateral 01/30/2014    Procedure: Thoracic eleven Kyphoplasty;  Surgeon: Consuella Lose, MD;  Location: [facility];  Service: Neurosurgery;  Laterality: Bilateral;  Thoracic eleven Kyphoplasty  . Av fistula placement Right 04/30/2014    Procedure: BRACHIOCEPHALIC ARTERIOVENOUS (AV) FISTULA CREATION;  Surgeon: Conrad Johns Creek, MD;  Location: [place];  Service: Vascular;  Laterality: Right;    Denies any headaches, dizziness, double vision, fevers, chills, night sweats, nausea, vomiting, diarrhea, constipation, chest pain, heart palpitations, shortness of breath, blood in stool, black tarry stool, urinary  pain, urinary burning, urinary frequency, hematuria.   PHYSICAL EXAMINATION  ECOG PERFORMANCE STATUS: 1 - Symptomatic but completely ambulatory  Filed Vitals:   08/13/14 1113  BP: 161/78  Pulse: 68  Temp: 98.7 F (37.1 C)  Resp: 18    GENERAL:alert, no  distress, well nourished, well developed, comfortable, cooperative, obese and smiling SKIN: skin color, texture, turgor are normal, no rashes or significant lesions HEAD: Normocephalic, No masses, lesions, tenderness or abnormalities EYES: normal, PERRLA, EOMI, Conjunctiva are pink and non-injected EARS: External ears normal OROPHARYNX:lips, buccal mucosa, and tongue normal and mucous membranes are moist  NECK: supple, no adenopathy, thyroid normal size, non-tender, without nodularity, no stridor, non-tender, trachea midline LYMPH:  no palpable lymphadenopathy BREAST:not examined LUNGS: clear to auscultation  HEART: regular rate & rhythm, no murmurs, no gallops, S1 normal and S2 normal ABDOMEN:abdomen soft, non-tender, obese, normal bowel sounds and no masses or organomegaly BACK: Back symmetric, no curvature., No CVA tenderness EXTREMITIES:less then 2 second capillary refill, no joint deformities, effusion, or inflammation, no edema, positive findings:  Petechial rash on feet, dorsal aspect.  2 single petechial palatal lesion. NEURO: alert & oriented x 3 with fluent speech, no focal motor/sensory deficits, gait normal   LABORATORY DATA: CBC    Component Value Date/Time   WBC 3.8* 08/13/2014 1051   RBC 2.01* 08/13/2014 1051   HGB 7.4* 08/13/2014 1051   HCT 22.7* 08/13/2014 1051   PLT 24* 08/13/2014 1051   MCV 112.9* 08/13/2014 1051   MCH 36.8* 08/13/2014 1051   MCHC 32.6 08/13/2014 1051   RDW 16.8* 08/13/2014 1051   LYMPHSABS 1.3 08/13/2014 1051   MONOABS 0.3 08/13/2014 1051   EOSABS 0.1 08/13/2014 1051   BASOSABS 0.0 08/13/2014 1051      Chemistry      Component Value Date/Time   NA 141 08/13/2014 1051   K 3.7 08/13/2014 1051   CL 103 08/13/2014 1051   CO2 27 08/13/2014 1051   BUN 39* 08/13/2014 1051   CREATININE 5.12* 08/13/2014 1051   CREATININE 43.09* 01/26/2012 1052      Component Value Date/Time   CALCIUM 8.1* 08/13/2014 1051   ALKPHOS 36* 08/13/2014 1051   AST  19 08/13/2014 1051   ALT 18 08/13/2014 1051   BILITOT 0.6 08/13/2014 1051       ASSESSMENT AND PLAN:  Multiple myeloma 69 year old male with multiple myeloma.  He has discontinued pomalyst secondary to severe fatigue.  He has started hemodialysis secondary to Stage IV renal failure 2 weeks ago.   I discussed with Marian that he needs to go back to [facility] to establish care with Dr. Marjory Lies replacement. He has multiple concerns about his myeloma recurring but is not interested currently in continuing therapy. We discussed new options for treatment that have recently received FDA approval.  He declines therapy.  His CBC has changed recently.  He is now leukopenic with a drop in platelet count.  His platelets are down to 24,000 compared to his baseline at 40-55,000.  He has bruising on the dorsal aspect of feet B/L and 2 petechial lesions on palate.  He denies any active bleeding.  Since he is on dialysis and receiving heparin, I have ordered a HIT panel, serotonin release assay, and discussed the case with Dr. Lowanda Foster (nephrology) and recommend he hold heparin until this is further sorted out.  Repeat CBC diff on Wednesday.  Macrocytosis is noted and I will check his B12 and Folate levels  too.    Given these changes, is another reason to be seen at [facility] for consideration of bone marrow aspiration and biopsy to evaluate for multiple myeloma relapse.  Repeat labs on Wednesday: CBC diff.  B12 and Folate ordered as well.  Return in 6 weeks for follow-up, sooner depending on work-up.  He will call [facility] to establish an appointment.      THERAPY PLAN:  Acute drop in platelets requiring further work-up as mentioned above.  Discussed with Dr. Lowanda Foster and he agrees to stop Heparin with dialysis for the time being.  Conn is agreeable to set-up an appointment with Dr. Marjory Lies replacement at [facility].   All questions were answered. The patient knows to call the clinic with any  problems, questions or concerns. We can certainly see the patient much sooner if necessary.  Patient and plan discussed with Dr. Ancil Linsey and she is in agreement with the aforementioned.   This note is electronically signed by: Robynn Pane 08/13/2014 12:17 PM

## 2014-08-13 NOTE — Telephone Encounter (Signed)
CRITICAL VALUE ALERT Critical value received:  Platelet count of 24,000  Date of notification:  08/13/14  Time of notification: 4996 Critical value read back:  Yes.   Nurse who received alert:  Mickie Kay, RN Robynn Pane, PA-C notified at [phone]

## 2014-08-13 NOTE — Patient Instructions (Signed)
..  [place] at [facility] Discharge Instructions  RECOMMENDATIONS MADE BY THE CONSULTANT AND ANY TEST RESULTS WILL BE SENT TO YOUR REFERRING PHYSICIAN.  Call [facility] for appt  We are checking some additional labs today  Labs on Thursday  Return to see Dr. Whitney Muse in 6 weeks   Thank you for choosing [place] at [facility] to provide your oncology and hematology care.  To afford each patient quality time with our provider, please arrive at least 15 minutes before your scheduled appointment time.    You need to re-schedule your appointment should you arrive 10 or more minutes late.  We strive to give you quality time with our providers, and arriving late affects you and other patients whose appointments are after yours.  Also, if you no show three or more times for appointments you may be dismissed from the clinic at the providers discretion.     Again, thank you for choosing [facility].  Our hope is that these requests will decrease the amount of time that you wait before being seen by our physicians.       _____________________________________________________________  Should you have questions after your visit to [facility], please contact our office at [phone] between the hours of 8:30 a.m. and 4:30 p.m.  Voicemails left after 4:30 p.m. will not be returned until the following business day.  For prescription refill requests, have your pharmacy contact our office.

## 2014-08-13 NOTE — Assessment & Plan Note (Addendum)
69 year old male with multiple myeloma.  He has discontinued pomalyst secondary to severe fatigue.  He has started hemodialysis secondary to Stage IV renal failure 2 weeks ago.   I discussed with Kyland that he needs to go back to [facility] to establish care with Dr. Marjory Lies replacement. He has multiple concerns about his myeloma recurring but is not interested currently in continuing therapy. We discussed new options for treatment that have recently received FDA approval.  He declines therapy.  His CBC has changed recently.  He is now leukopenic with a drop in platelet count.  His platelets are down to 24,000 compared to his baseline at 40-55,000.  He has bruising on the dorsal aspect of feet B/L and 2 petechial lesions on palate.  He denies any active bleeding.  Since he is on dialysis and receiving heparin, I have ordered a HIT panel, serotonin release assay, and discussed the case with Dr. Lowanda Foster (nephrology) and recommend he hold heparin until this is further sorted out.  Repeat CBC diff on Wednesday.  Macrocytosis is noted and I will check his B12 and Folate levels too.    Given these changes, is another reason to be seen at [facility] for consideration of bone marrow aspiration and biopsy to evaluate for multiple myeloma relapse.  Repeat labs on Wednesday: CBC diff.  B12 and Folate ordered as well.    Rx for Lyrica 75 mg daily provided.  Return in 6 weeks for follow-up, sooner depending on work-up.  He will call [facility] to establish an appointment.

## 2014-08-14 LAB — KAPPA/LAMBDA LIGHT CHAINS
KAPPA, LAMDA LIGHT CHAIN RATIO: 4.03 — AB (ref 0.26–1.65)
Kappa free light chain: 110.54 mg/L — ABNORMAL HIGH (ref 3.30–19.40)
LAMDA FREE LIGHT CHAINS: 27.4 mg/L — AB (ref 5.71–26.30)

## 2014-08-14 LAB — HEPARIN INDUCED THROMBOCYTOPENIA PNL: Heparin Induced Plt Ab: 0.484 OD — ABNORMAL HIGH (ref 0.000–0.400)

## 2014-08-14 LAB — BETA 2 MICROGLOBULIN, SERUM: BETA 2 MICROGLOBULIN: 11.9 mg/L — AB (ref 0.6–2.4)

## 2014-08-15 ENCOUNTER — Encounter (HOSPITAL_BASED_OUTPATIENT_CLINIC_OR_DEPARTMENT_OTHER): Payer: Medicare Other

## 2014-08-15 ENCOUNTER — Telehealth (HOSPITAL_COMMUNITY): Payer: Self-pay | Admitting: *Deleted

## 2014-08-15 DIAGNOSIS — D696 Thrombocytopenia, unspecified: Secondary | ICD-10-CM

## 2014-08-15 DIAGNOSIS — D7589 Other specified diseases of blood and blood-forming organs: Secondary | ICD-10-CM

## 2014-08-15 DIAGNOSIS — C9000 Multiple myeloma not having achieved remission: Secondary | ICD-10-CM | POA: Diagnosis not present

## 2014-08-15 LAB — CBC WITH DIFFERENTIAL/PLATELET
BASOS ABS: 0 10*3/uL (ref 0.0–0.1)
BASOS PCT: 0 % (ref 0–1)
Eosinophils Absolute: 0.1 10*3/uL (ref 0.0–0.7)
Eosinophils Relative: 1 % (ref 0–5)
HCT: 23.6 % — ABNORMAL LOW (ref 39.0–52.0)
Hemoglobin: 7.6 g/dL — ABNORMAL LOW (ref 13.0–17.0)
LYMPHS PCT: 39 % (ref 12–46)
Lymphs Abs: 1.7 10*3/uL (ref 0.7–4.0)
MCH: 36.4 pg — ABNORMAL HIGH (ref 26.0–34.0)
MCHC: 32.2 g/dL (ref 30.0–36.0)
MCV: 112.9 fL — AB (ref 78.0–100.0)
MONO ABS: 0.3 10*3/uL (ref 0.1–1.0)
MONOS PCT: 7 % (ref 3–12)
Neutro Abs: 2.3 10*3/uL (ref 1.7–7.7)
Neutrophils Relative %: 53 % (ref 43–77)
Platelets: 26 10*3/uL — CL (ref 150–400)
RBC: 2.09 MIL/uL — ABNORMAL LOW (ref 4.22–5.81)
RDW: 16.9 % — AB (ref 11.5–15.5)
WBC: 4.5 10*3/uL (ref 4.0–10.5)

## 2014-08-15 LAB — MULTIPLE MYELOMA PANEL, SERUM
ALBUMIN/GLOB SERPL: 1.5 (ref 0.7–2.0)
Albumin SerPl Elph-Mcnc: 3.4 g/dL (ref 3.2–5.6)
Alpha 1: 0.3 g/dL (ref 0.1–0.4)
Alpha2 Glob SerPl Elph-Mcnc: 0.5 g/dL (ref 0.4–1.2)
B-Globulin SerPl Elph-Mcnc: 0.9 g/dL (ref 0.6–1.3)
GAMMA GLOB SERPL ELPH-MCNC: 0.8 g/dL (ref 0.5–1.6)
GLOBULIN, TOTAL: 2.4 g/dL (ref 2.0–4.5)
IGA: 91 mg/dL (ref 61–437)
IGG (IMMUNOGLOBIN G), SERUM: 633 mg/dL — AB (ref 700–1600)
IGM, SERUM: 87 mg/dL (ref 20–172)
TOTAL PROTEIN ELP: 5.8 g/dL — AB (ref 6.0–8.5)

## 2014-08-15 LAB — FOLATE: Folate: >20 ng/mL

## 2014-08-15 LAB — VITAMIN B12: Vitamin B-12: 447 pg/mL (ref 211–911)

## 2014-08-15 NOTE — Telephone Encounter (Signed)
..  CRITICAL VALUE ALERT Critical value received:  Platelets are 26000 Date of notification:  08/15/2014 Time of notification: 4431 Critical value read back:  Yes.   Nurse who received alert:  TAR MD notified (1st page):  T. Sheldon Silvan PAC and Dr. Rosealee Albee on 08/20/2014

## 2014-08-15 NOTE — Progress Notes (Signed)
LABS DRAWN

## 2014-08-16 DIAGNOSIS — D509 Iron deficiency anemia, unspecified: Secondary | ICD-10-CM | POA: Diagnosis not present

## 2014-08-16 DIAGNOSIS — D631 Anemia in chronic kidney disease: Secondary | ICD-10-CM | POA: Diagnosis not present

## 2014-08-16 DIAGNOSIS — N186 End stage renal disease: Secondary | ICD-10-CM | POA: Diagnosis not present

## 2014-08-16 DIAGNOSIS — Z992 Dependence on renal dialysis: Secondary | ICD-10-CM | POA: Diagnosis not present

## 2014-08-16 LAB — SEROTONIN RELEASE ASSAY (SRA)
SRA .2 IU/mL UFH Ser-aCnc: 2 % (ref 0–20)
SRA, HIGH DOSE HEPARIN: 1 % (ref 0–20)

## 2014-08-20 ENCOUNTER — Encounter (HOSPITAL_COMMUNITY): Payer: Medicare Other | Attending: Oncology

## 2014-08-20 ENCOUNTER — Other Ambulatory Visit (HOSPITAL_COMMUNITY): Payer: Self-pay | Admitting: Oncology

## 2014-08-20 DIAGNOSIS — C9000 Multiple myeloma not having achieved remission: Secondary | ICD-10-CM | POA: Diagnosis not present

## 2014-08-20 LAB — CBC WITH DIFFERENTIAL/PLATELET
BASOS PCT: 0 % (ref 0–1)
Basophils Absolute: 0 10*3/uL (ref 0.0–0.1)
Eosinophils Absolute: 0.1 10*3/uL (ref 0.0–0.7)
Eosinophils Relative: 2 % (ref 0–5)
HCT: 23.3 % — ABNORMAL LOW (ref 39.0–52.0)
HEMOGLOBIN: 7.5 g/dL — AB (ref 13.0–17.0)
Lymphocytes Relative: 36 % (ref 12–46)
Lymphs Abs: 1.5 10*3/uL (ref 0.7–4.0)
MCH: 36.9 pg — AB (ref 26.0–34.0)
MCHC: 32.2 g/dL (ref 30.0–36.0)
MCV: 114.8 fL — ABNORMAL HIGH (ref 78.0–100.0)
Monocytes Absolute: 0.3 10*3/uL (ref 0.1–1.0)
Monocytes Relative: 6 % (ref 3–12)
NEUTROS ABS: 2.4 10*3/uL (ref 1.7–7.7)
NEUTROS PCT: 57 % (ref 43–77)
PLATELETS: 28 10*3/uL — AB (ref 150–400)
RBC: 2.03 MIL/uL — AB (ref 4.22–5.81)
RDW: 17.1 % — ABNORMAL HIGH (ref 11.5–15.5)
SMEAR REVIEW: DECREASED
WBC: 4.3 10*3/uL (ref 4.0–10.5)

## 2014-08-20 NOTE — Progress Notes (Signed)
Labs drawn

## 2014-08-20 NOTE — Progress Notes (Signed)
CRITICAL VALUE ALERT Critical value received:  Platelets 28,000 Date of notification:  08/20/2014 Time of notification: 4270 Critical value read back:  Yes.   Nurse who received alert:  Shellia Carwin MD notified (1st page):  Dr Whitney Muse [phone]

## 2014-08-24 ENCOUNTER — Encounter (HOSPITAL_BASED_OUTPATIENT_CLINIC_OR_DEPARTMENT_OTHER): Payer: Medicare Other

## 2014-08-24 ENCOUNTER — Telehealth (HOSPITAL_COMMUNITY): Payer: Self-pay

## 2014-08-24 DIAGNOSIS — C9000 Multiple myeloma not having achieved remission: Secondary | ICD-10-CM

## 2014-08-24 LAB — CBC WITH DIFFERENTIAL/PLATELET
BASOS ABS: 0 10*3/uL (ref 0.0–0.1)
BASOS PCT: 0 % (ref 0–1)
EOS PCT: 2 % (ref 0–5)
Eosinophils Absolute: 0.1 10*3/uL (ref 0.0–0.7)
HEMATOCRIT: 24.6 % — AB (ref 39.0–52.0)
Hemoglobin: 7.8 g/dL — ABNORMAL LOW (ref 13.0–17.0)
Lymphocytes Relative: 41 % (ref 12–46)
Lymphs Abs: 1.8 10*3/uL (ref 0.7–4.0)
MCH: 35.9 pg — ABNORMAL HIGH (ref 26.0–34.0)
MCHC: 31.7 g/dL (ref 30.0–36.0)
MCV: 113.4 fL — ABNORMAL HIGH (ref 78.0–100.0)
MONO ABS: 0.3 10*3/uL (ref 0.1–1.0)
Monocytes Relative: 7 % (ref 3–12)
NEUTROS ABS: 2.2 10*3/uL (ref 1.7–7.7)
Neutrophils Relative %: 51 % (ref 43–77)
Platelets: 26 10*3/uL — CL (ref 150–400)
RBC: 2.17 MIL/uL — ABNORMAL LOW (ref 4.22–5.81)
RDW: 17 % — ABNORMAL HIGH (ref 11.5–15.5)
WBC: 4.3 10*3/uL (ref 4.0–10.5)

## 2014-08-24 NOTE — Telephone Encounter (Signed)
CRITICAL VALUE ALERT Critical value received:  Platelet count 26,000  Date of notification:  08/24/14  Time of notification: 10:30 Critical value read back:  Yes.   Nurse who received alert:  Mickie Kay, RN MD notified (1st page):  Dr. Whitney Muse

## 2014-08-24 NOTE — Progress Notes (Signed)
Labs drawn

## 2014-08-27 NOTE — Progress Notes (Signed)
Called pt and left message for pt to return call.

## 2014-08-29 ENCOUNTER — Telehealth (HOSPITAL_COMMUNITY): Payer: Self-pay | Admitting: Oncology

## 2014-08-29 ENCOUNTER — Other Ambulatory Visit (HOSPITAL_COMMUNITY): Payer: Self-pay | Admitting: Oncology

## 2014-08-29 DIAGNOSIS — C9000 Multiple myeloma not having achieved remission: Secondary | ICD-10-CM

## 2014-08-29 NOTE — Telephone Encounter (Signed)
Patient called to let me know he had a bone marrow aspiration at [facility].  He was very impressed with Dr. Norma Fredrickson.    We will check labs on Friday.  [name] 08/29/2014

## 2014-08-30 ENCOUNTER — Other Ambulatory Visit (HOSPITAL_COMMUNITY): Payer: Self-pay | Admitting: Oncology

## 2014-08-30 ENCOUNTER — Telehealth (HOSPITAL_COMMUNITY): Payer: Self-pay | Admitting: Oncology

## 2014-08-30 NOTE — Telephone Encounter (Signed)
Spoke with Dr. Norma Fredrickson at [facility].  He notes that the patient's Kappa/Lambda light chain ratio has increased, but has remained stable.  It is difficult to determine if this is secondary to MM versus his other ongoing conditions, including renal failure.    A bone marrow was performed and demonstrates 5% plasma cells.  Therefore, two options exist: 1. Watchful expectation 2. Maintenance therapy.  He recommend Revlimid at 5 mg daily 21/28 days.  I have scoured [facility] and his paper chart and confirmed that the patient has not been on Revlimid.  He was treated with thalidomide based therapy followed by BMT, followed by maintenance thalidomide.  He then relapsed resulting in a repeat bone marrow at [facility] followed by Pomalyst maintenance until he stopped taking the medication recently.    Dr. Norma Fredrickson would prefer maintenance therapy, but he understands that Hung may not be interested in that given his character.  This may be an indication of impending relapse.  Dr. Norma Fredrickson is nice enough to help from the periphery and is willing to assist in this patient's care.  He provided me hi cell phone number for future assistance as needed.  KEFALAS,THOMAS 08/30/2014 4:09 PM

## 2014-08-31 ENCOUNTER — Encounter (HOSPITAL_BASED_OUTPATIENT_CLINIC_OR_DEPARTMENT_OTHER): Payer: Medicare Other

## 2014-08-31 ENCOUNTER — Encounter (HOSPITAL_COMMUNITY): Payer: Self-pay | Admitting: Oncology

## 2014-08-31 ENCOUNTER — Encounter (HOSPITAL_BASED_OUTPATIENT_CLINIC_OR_DEPARTMENT_OTHER): Payer: Medicare Other | Admitting: Oncology

## 2014-08-31 VITALS — BP 123/68 | HR 58 | Temp 98.1°F | Resp 20 | Wt 250.0 lb

## 2014-08-31 DIAGNOSIS — C9000 Multiple myeloma not having achieved remission: Secondary | ICD-10-CM

## 2014-08-31 DIAGNOSIS — D696 Thrombocytopenia, unspecified: Secondary | ICD-10-CM | POA: Diagnosis not present

## 2014-08-31 DIAGNOSIS — R5383 Other fatigue: Secondary | ICD-10-CM | POA: Diagnosis not present

## 2014-08-31 HISTORY — DX: Thrombocytopenia, unspecified: D69.6

## 2014-08-31 LAB — CBC WITH DIFFERENTIAL/PLATELET
BASOS ABS: 0 10*3/uL (ref 0.0–0.1)
BASOS PCT: 0 % (ref 0–1)
EOS PCT: 3 % (ref 0–5)
Eosinophils Absolute: 0.1 10*3/uL (ref 0.0–0.7)
HEMATOCRIT: 22.2 % — AB (ref 39.0–52.0)
Hemoglobin: 7.1 g/dL — ABNORMAL LOW (ref 13.0–17.0)
Lymphocytes Relative: 38 % (ref 12–46)
Lymphs Abs: 1.5 10*3/uL (ref 0.7–4.0)
MCH: 36.6 pg — ABNORMAL HIGH (ref 26.0–34.0)
MCHC: 32 g/dL (ref 30.0–36.0)
MCV: 114.4 fL — AB (ref 78.0–100.0)
Monocytes Absolute: 0.3 10*3/uL (ref 0.1–1.0)
Monocytes Relative: 7 % (ref 3–12)
Neutro Abs: 2.1 10*3/uL (ref 1.7–7.7)
Neutrophils Relative %: 52 % (ref 43–77)
Platelets: 18 10*3/uL — CL (ref 150–400)
RBC: 1.94 MIL/uL — ABNORMAL LOW (ref 4.22–5.81)
RDW: 17.3 % — AB (ref 11.5–15.5)
WBC: 4 10*3/uL (ref 4.0–10.5)

## 2014-08-31 LAB — TYPE AND SCREEN
ABO/RH(D): A POS
Antibody Screen: NEGATIVE
Unit division: 0
Unit division: 0

## 2014-08-31 MED ORDER — SODIUM CHLORIDE 0.9 % IJ SOLN: 10.0000 mL | INTRAMUSCULAR | Status: DC | PRN

## 2014-08-31 MED ORDER — ACETAMINOPHEN 325 MG PO TABS
ORAL_TABLET | ORAL | Status: AC
  Filled 2014-08-31: qty 2

## 2014-08-31 MED ORDER — SODIUM CHLORIDE 0.9 % IV SOLN
250.0000 mL | Freq: Once | INTRAVENOUS | Status: AC
  Administered 2014-08-31: 250 mL via INTRAVENOUS

## 2014-08-31 MED ORDER — ACETAMINOPHEN 325 MG PO TABS
650.0000 mg | ORAL_TABLET | Freq: Once | ORAL | Status: AC
  Administered 2014-08-31: 650 mg via ORAL

## 2014-08-31 MED ORDER — DIPHENHYDRAMINE HCL 25 MG PO CAPS
ORAL_CAPSULE | ORAL | Status: AC
  Filled 2014-08-31: qty 1

## 2014-08-31 MED ORDER — DIPHENHYDRAMINE HCL 25 MG PO CAPS
25.0000 mg | ORAL_CAPSULE | Freq: Once | ORAL | Status: AC
  Administered 2014-08-31: 25 mg via ORAL

## 2014-08-31 NOTE — Addendum Note (Signed)
Addended by: Jerald Kief on: 08/31/2014 10:58 AM   Modules accepted: Orders

## 2014-08-31 NOTE — Progress Notes (Signed)
[name] is seen as a work-in today.  I spoke with Dr. Norma Fredrickson at [facility] yesterday afternoon.  My telephone note is in [facility].  Bone marrow aspiration and biopsy demonstrates 5% plasma cells which may be indicative of impending relapse.  He recommended watchful expectation versus Revlimid maintenance (5 mg daily 21/28 days).  He had some questions which I answered to his satisfaction.    I personally reviewed and went over laboratory results with the patient.  The results are noted within this dictation.  His platelet count is down to 18,000 and therefore we will give him a platelet transfusion.  His Hgb is down to 7.1 g/dL and he complains of fatigue which is likely anemia-induced.  He demands a blood transfusion.  This should be done by nephrology at dialysis.  He would benefit from a Hgb of 8.5 g/dL or greater.   I discussed Revlimid with the patient.  I printed out information regarding the medication.  He will consider this option.  He will return on 4/20 for further discussion and decision making.  Patient and plan discussed with Dr. Ancil Linsey and she is in agreement with the aforementioned.   [name],Gregory 08/31/2014 1:20 PM

## 2014-08-31 NOTE — Patient Instructions (Signed)
[place] at [facility]  Discharge Instructions:  You had transfusion of platelets please follow up as scheduled call the clinic if you have any questions or concerns _______________________________________________________________  Thank you for choosing [place] at [facility] to provide your oncology and hematology care.  To afford each patient quality time with our providers, please arrive at least 15 minutes before your scheduled appointment.  You need to re-schedule your appointment if you arrive 10 or more minutes late.  We strive to give you quality time with our providers, and arriving late affects you and other patients whose appointments are after yours.  Also, if you no show three or more times for appointments you may be dismissed from the clinic.  Again, thank you for choosing [place] at [place] hope is that these requests will allow you access to exceptional care and in a timely manner. _______________________________________________________________  If you have questions after your visit, please contact our office at [phone] between the hours of 8:30 a.m. and 5:00 p.m. Voicemails left after 4:30 p.m. will not be returned until the following business day. _______________________________________________________________  For prescription refill requests, have your pharmacy contact our office. _______________________________________________________________  Recommendations made by the consultant and any test results will be sent to your referring physician. _______________________________________________________________

## 2014-08-31 NOTE — Progress Notes (Signed)
Please see doctors encounter for assessment  [name] Tolerated platelet transfusion Discharged ambulatory

## 2014-08-31 NOTE — Patient Instructions (Addendum)
[name] at [facility] Discharge Instructions  RECOMMENDATIONS MADE BY THE CONSULTANT AND ANY TEST RESULTS WILL BE SENT TO YOUR REFERRING PHYSICIAN.  Exam and discussion by Robynn Pane, PA-C Will transfuse platelets today.  Follow-up as scheduled to see Dr. Whitney Muse.  Thank you for choosing [place] at [facility] to provide your oncology and hematology care.  To afford each patient quality time with our provider, please arrive at least 15 minutes before your scheduled appointment time.    You need to re-schedule your appointment should you arrive 10 or more minutes late.  We strive to give you quality time with our providers, and arriving late affects you and other patients whose appointments are after yours.  Also, if you no show three or more times for appointments you may be dismissed from the clinic at the providers discretion.     Again, thank you for choosing [facility].  Our hope is that these requests will decrease the amount of time that you wait before being seen by our physicians.       _____________________________________________________________  Should you have questions after your visit to [facility], please contact our office at [phone] between the hours of 8:30 a.m. and 4:30 p.m.  Voicemails left after 4:30 p.m. will not be returned until the following business day.  For prescription refill requests, have your pharmacy contact our office.

## 2014-08-31 NOTE — Progress Notes (Signed)
Labs drawn

## 2014-09-01 ENCOUNTER — Encounter (HOSPITAL_COMMUNITY): Payer: Self-pay | Admitting: Emergency Medicine

## 2014-09-01 ENCOUNTER — Emergency Department (HOSPITAL_COMMUNITY)
Admission: EM | Admit: 2014-09-01 | Discharge: 2014-09-01 | Disposition: A | Discharge status: Home / Self Care | Payer: Medicare Other | Attending: Emergency Medicine | Admitting: Emergency Medicine

## 2014-09-01 ENCOUNTER — Other Ambulatory Visit: Payer: Self-pay

## 2014-09-01 DIAGNOSIS — N185 Chronic kidney disease, stage 5: Secondary | ICD-10-CM | POA: Insufficient documentation

## 2014-09-01 DIAGNOSIS — Z8719 Personal history of other diseases of the digestive system: Secondary | ICD-10-CM | POA: Insufficient documentation

## 2014-09-01 DIAGNOSIS — I120 Hypertensive chronic kidney disease with stage 5 chronic kidney disease or end stage renal disease: Secondary | ICD-10-CM | POA: Diagnosis not present

## 2014-09-01 DIAGNOSIS — R55 Syncope and collapse: Secondary | ICD-10-CM | POA: Diagnosis not present

## 2014-09-01 DIAGNOSIS — Z8739 Personal history of other diseases of the musculoskeletal system and connective tissue: Secondary | ICD-10-CM | POA: Insufficient documentation

## 2014-09-01 DIAGNOSIS — Z8579 Personal history of other malignant neoplasms of lymphoid, hematopoietic and related tissues: Secondary | ICD-10-CM | POA: Diagnosis not present

## 2014-09-01 DIAGNOSIS — R404 Transient alteration of awareness: Secondary | ICD-10-CM | POA: Diagnosis not present

## 2014-09-01 DIAGNOSIS — Z992 Dependence on renal dialysis: Secondary | ICD-10-CM | POA: Insufficient documentation

## 2014-09-01 DIAGNOSIS — Z79899 Other long term (current) drug therapy: Secondary | ICD-10-CM | POA: Diagnosis not present

## 2014-09-01 DIAGNOSIS — R531 Weakness: Secondary | ICD-10-CM | POA: Diagnosis not present

## 2014-09-01 DIAGNOSIS — D649 Anemia, unspecified: Secondary | ICD-10-CM | POA: Insufficient documentation

## 2014-09-01 LAB — CBC
HEMATOCRIT: 22.1 % — AB (ref 39.0–52.0)
Hemoglobin: 7.1 g/dL — ABNORMAL LOW (ref 13.0–17.0)
MCH: 36.8 pg — AB (ref 26.0–34.0)
MCHC: 32.1 g/dL (ref 30.0–36.0)
MCV: 114.5 fL — ABNORMAL HIGH (ref 78.0–100.0)
Platelets: 35 10*3/uL — ABNORMAL LOW (ref 150–400)
RBC: 1.93 MIL/uL — ABNORMAL LOW (ref 4.22–5.81)
RDW: 17.3 % — ABNORMAL HIGH (ref 11.5–15.5)
WBC: 3.8 10*3/uL — ABNORMAL LOW (ref 4.0–10.5)

## 2014-09-01 LAB — COMPREHENSIVE METABOLIC PANEL
ALK PHOS: 43 U/L (ref 39–117)
ALT: 30 U/L (ref 0–53)
AST: 30 U/L (ref 0–37)
Albumin: 3.5 g/dL (ref 3.5–5.2)
Anion gap: 11 (ref 5–15)
BILIRUBIN TOTAL: 0.8 mg/dL (ref 0.3–1.2)
BUN: 51 mg/dL — AB (ref 6–23)
CALCIUM: 8 mg/dL — AB (ref 8.4–10.5)
CHLORIDE: 106 mmol/L (ref 96–112)
CO2: 23 mmol/L (ref 19–32)
Creatinine, Ser: 5.41 mg/dL — ABNORMAL HIGH (ref 0.50–1.35)
GFR calc non Af Amer: 10 mL/min — ABNORMAL LOW (ref >90)
GFR, EST AFRICAN AMERICAN: 12 mL/min — AB (ref >90)
Glucose, Bld: 107 mg/dL — ABNORMAL HIGH (ref 70–99)
POTASSIUM: 4.7 mmol/L (ref 3.5–5.1)
Sodium: 140 mmol/L (ref 135–145)
Total Protein: 6.2 g/dL (ref 6.0–8.3)

## 2014-09-01 LAB — PREPARE PLATELET PHERESIS: Unit division: 0

## 2014-09-01 LAB — TROPONIN I

## 2014-09-01 NOTE — Discharge Instructions (Signed)
Syncope °Syncope is a medical term for fainting or passing out. This means you lose consciousness and drop to the ground. People are generally unconscious for less than 5 minutes. You may have some muscle twitches for up to 15 seconds before waking up and returning to normal. Syncope occurs more often in older adults, but it can happen to anyone. While most causes of syncope are not dangerous, syncope can be a sign of a serious medical problem. It is important to seek medical care.  °CAUSES  °Syncope is caused by a sudden drop in blood flow to the brain. The specific cause is often not determined. Factors that can bring on syncope include: °· Taking medicines that lower blood pressure. °· Sudden changes in posture, such as standing up quickly. °· Taking more medicine than prescribed. °· Standing in one place for too long. °· Seizure disorders. °· Dehydration and excessive exposure to heat. °· Low blood sugar (hypoglycemia). °· Straining to have a bowel movement. °· Heart disease, irregular heartbeat, or other circulatory problems. °· Fear, emotional distress, seeing blood, or severe pain. °SYMPTOMS  °Right before fainting, you may: °· Feel dizzy or light-headed. °· Feel nauseous. °· See all white or all black in your field of vision. °· Have cold, clammy skin. °DIAGNOSIS  °Your health care provider will ask about your symptoms, perform a physical exam, and perform an electrocardiogram (ECG) to record the electrical activity of your heart. Your health care provider may also perform other heart or blood tests to determine the cause of your syncope which may include: °· Transthoracic echocardiogram (TTE). During echocardiography, sound waves are used to evaluate how blood flows through your heart. °· Transesophageal echocardiogram (TEE). °· Cardiac monitoring. This allows your health care provider to monitor your heart rate and rhythm in real time. °· Holter monitor. This is a portable device that records your  heartbeat and can help diagnose heart arrhythmias. It allows your health care provider to track your heart activity for several days, if needed. °· Stress tests by exercise or by giving medicine that makes the heart beat faster. °TREATMENT  °In most cases, no treatment is needed. Depending on the cause of your syncope, your health care provider may recommend changing or stopping some of your medicines. °HOME CARE INSTRUCTIONS °· Have someone stay with you until you feel stable. °· Do not drive, use machinery, or play sports until your health care provider says it is okay. °· Keep all follow-up appointments as directed by your health care provider. °· Lie down right away if you start feeling like you might faint. Breathe deeply and steadily. Wait until all the symptoms have passed. °· Drink enough fluids to keep your urine clear or pale yellow. °· If you are taking blood pressure or heart medicine, get up slowly and take several minutes to sit and then stand. This can reduce dizziness. °SEEK IMMEDIATE MEDICAL CARE IF:  °· You have a severe headache. °· You have unusual pain in the chest, abdomen, or back. °· You are bleeding from your mouth or rectum, or you have black or tarry stool. °· You have an irregular or very fast heartbeat. °· You have pain with breathing. °· You have repeated fainting or seizure-like jerking during an episode. °· You faint when sitting or lying down. °· You have confusion. °· You have trouble walking. °· You have severe weakness. °· You have vision problems. °If you fainted, call your local emergency services (911 in [place] Do not drive   yourself to the hospital.  °MAKE SURE YOU: °· Understand these instructions. °· Will watch your condition. °· Will get help right away if you are not doing well or get worse. °Document Released: 05/04/2005 Document Revised: 05/09/2013 Document Reviewed: 07/03/2011 °[employer]® Patient Information ©2015 [employer]. This information is not intended to replace  advice given to you by your health care provider. Make sure you discuss any questions you have with your health care provider. ° °

## 2014-09-01 NOTE — ED Notes (Signed)
Unable to obtain IV access after several attempts made by various staff members. Pt with shunt in Rt arm and permacath to L. Chest.

## 2014-09-01 NOTE — ED Notes (Signed)
Pt was receiving hemodialysis and was about 19mins into it when he passed out. EMS was called and when they arrived, pt was pale and diaphoretic. When he stood to get on stretcher; he vomited. Pt received a platelet transfusion on yesterday for PLT count of 7. Pt does not remember any of this.

## 2014-09-01 NOTE — ED Provider Notes (Signed)
CSN: [id number]     Arrival date & time    History  This chart was scribed for Gregory Schmidt, MD by Jeanell Sparrow, ED Scribe. This patient was seen in room APA08/APA08 and the patient's care was started at 12:00 PM.   Chief Complaint  Patient presents with  . Loss of Consciousness   The history is provided by the patient. No language interpreter was used.   HPI Comments: [name] is a 69 y.o. male who presents to the Emergency Department complaining of an episode of LOC that occurred today. He reports that he passed out during his hemodialysis today. He states that he is currently feeling okay, and has been feeling normal leading up to the hemodialysis. He reports no recent hx of LOC. He denies any blood in stool, fever, or palpitations.   Past Medical History  Diagnosis Date  . Hypertension   . Ruptured cervical disc   . Fall resulting in striking against other object     paralyzed  . Multiple myeloma   . 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 Multiple myeloma 01/26/2011  . Unspecified vitamin D deficiency 03/06/2013  . Anemia of chronic disease 12/30/2013  . Chronic renal disease, stage 5, glomerular filtration rate less than or equal to 15 mL/min/1.73 square meter 12/30/2013  . Constipation   . Dialysis patient 2016  . Thrombocytopenia 08/31/2014   Past Surgical History  Procedure Laterality Date  . Inguinal hernia repair Bilateral   . Anterior cervical decomp/discectomy fusion    . Bone marrow aspirate and biopsy wiith lumbar puncture    . Melanoma excision      rt. breast  . Cervical spine surgery    . Kyphoplasty Bilateral 01/30/2014    Procedure: Thoracic eleven Kyphoplasty;  Surgeon: Consuella Lose, MD;  Location: [facility];  Service: Neurosurgery;  Laterality: Bilateral;  Thoracic eleven Kyphoplasty  . Av fistula placement Right 04/30/2014    Procedure: BRACHIOCEPHALIC ARTERIOVENOUS (AV) FISTULA CREATION;  Surgeon: Conrad [place],  MD;  Location: [place];  Service: Vascular;  Laterality: Right;   Family History  Problem Relation Age of Onset  . Cancer Sister   . Cancer Brother    History  Substance Use Topics  . Smoking status: Never Smoker   . Smokeless tobacco: Never Used  . Alcohol Use: No    Review of Systems A complete 10 system review of systems was obtained and all systems are negative except as noted in the HPI and PMH.   Allergies  Pamidronate  Home Medications   Prior to Admission medications   Medication Sig Start Date End Date Taking? Authorizing Provider  docusate sodium (COLACE) 100 MG capsule Take 100 mg by mouth 2 (two) times daily.    Historical Provider, MD  furosemide (LASIX) 20 MG tablet Take 40 mg by mouth daily.    Historical Provider, MD  metoprolol succinate (TOPROL XL) 25 MG 24 hr tablet Take 25 mg by mouth daily.  12/18/13   Historical Provider, MD  Omega-3 Fatty Acids (FISH OIL) 1000 MG CAPS Take 2 capsules by mouth daily.    Historical Provider, MD  oxyCODONE-acetaminophen (ROXICET) 5-325 MG per tablet Take 1 tablet by mouth every 6 (six) hours as needed for severe pain. Patient not taking: Reported on 08/31/2014 04/30/14   Alvia Grove, PA-C  pomalidomide (POMALYST) 1 MG capsule Take 1 tablet every other day 14 days and then 7 days off, then repeat  Patient not taking: Reported on 08/13/2014 06/04/14   Manon Hilding Kefalas, PA-C  pregabalin (LYRICA) 75 MG capsule Take 1 capsule (75 mg total) by mouth 2 (two) times daily. 08/13/14   Manon Hilding Kefalas, PA-C   BP 147/71 mmHg  Pulse 63  Temp(Src) 97.8 F (36.6 C) (Oral)  Resp 14  Ht _0  (1.803 m)  Wt 245 lb (111.131 kg)  BMI 34.19 kg/m2  SpO2 96% Physical Exam  Constitutional: He is oriented to person, place, and time. He appears well-developed and well-nourished.  HENT:  Head: Normocephalic and atraumatic.  Eyes: EOM are normal.  Neck: Normal range of motion.  Cardiovascular: Normal rate, regular rhythm, normal heart sounds  and intact distal pulses.   Pulmonary/Chest: Effort normal and breath sounds normal. No respiratory distress.  Abdominal: Soft. He exhibits no distension. There is no tenderness.  Musculoskeletal: Normal range of motion.  Neurological: He is alert and oriented to person, place, and time.  Skin: Skin is warm and dry.  Psychiatric: He has a normal mood and affect. Judgment normal.  Nursing note and vitals reviewed.   ED Course  Procedures (including critical care time) DIAGNOSTIC STUDIES: Oxygen Saturation is 96% on RA, normal by my interpretation.    COORDINATION OF CARE: 12:04 PM- Pt advised of plan for treatment which includes labs and pt agrees.  Labs Review Labs Reviewed  CBC - Abnormal; Notable for the following:    WBC 3.8 (*)    RBC 1.93 (*)    Hemoglobin 7.1 (*)    HCT 22.1 (*)    MCV 114.5 (*)    MCH 36.8 (*)    RDW 17.3 (*)    Platelets 35 (*)    All other components within normal limits  COMPREHENSIVE METABOLIC PANEL - Abnormal; Notable for the following:    Glucose, Bld 107 (*)    BUN 51 (*)    Creatinine, Ser 5.41 (*)    Calcium 8.0 (*)    GFR calc non Af Amer 10 (*)    GFR calc Af Amer 12 (*)    All other components within normal limits  TROPONIN I    Imaging Review No results found.   EKG Interpretation   Date/Time:  Saturday September 01 2014 11:25:37 EDT Ventricular Rate:  65 PR Interval:  174 QRS Duration: 126 QT Interval:  470 QTC Calculation: 489 R Axis:   -14 Text Interpretation:  Sinus rhythm Multiple ventricular premature  complexes Nonspecific intraventricular conduction delay No significant  change was found Confirmed by Jarmon Javid  MD, Norvell Ureste ([id number]) on 09/01/2014  11:38:58 AM      MDM   Final diagnoses:  Syncope, unspecified syncope type  Anemia, unspecified anemia type    1:13 PM Patient is feeling fine at this time.  He continues to be asymptomatic.  Stood at the bedside without difficulty.  Discharge home in good condition.   Chronic anemia for the patient.  He states his hemoglobin is always around 7.  No symptoms to suggest GI bleed.  No preceding chest pain or palpitations.  No chest pain now.  EKG without ischemic changes.   I personally performed the services described in this documentation, which was scribed in my presence. The recorded information has been reviewed and is accurate.      Gregory Schmidt, MD 09/01/14 [id number]

## 2014-09-03 ENCOUNTER — Ambulatory Visit (HOSPITAL_COMMUNITY)
Admission: RE | Admit: 2014-09-03 | Discharge: 2014-09-03 | Disposition: A | Discharge status: Home / Self Care | Payer: Medicare Other | Source: Ambulatory Visit | Attending: Nephrology | Admitting: Nephrology

## 2014-09-03 DIAGNOSIS — Z01818 Encounter for other preprocedural examination: Secondary | ICD-10-CM | POA: Diagnosis not present

## 2014-09-03 LAB — CBC
HCT: 22 % — ABNORMAL LOW (ref 39.0–52.0)
HEMOGLOBIN: 7.2 g/dL — AB (ref 13.0–17.0)
MCH: 36.9 pg — AB (ref 26.0–34.0)
MCHC: 32.7 g/dL (ref 30.0–36.0)
MCV: 112.8 fL — AB (ref 78.0–100.0)
Platelets: 32 10*3/uL — ABNORMAL LOW (ref 150–400)
RBC: 1.95 MIL/uL — ABNORMAL LOW (ref 4.22–5.81)
RDW: 17.4 % — ABNORMAL HIGH (ref 11.5–15.5)
WBC: 3.1 10*3/uL — ABNORMAL LOW (ref 4.0–10.5)

## 2014-09-03 LAB — PREPARE RBC (CROSSMATCH)

## 2014-09-04 ENCOUNTER — Encounter (HOSPITAL_COMMUNITY): Payer: Self-pay

## 2014-09-04 ENCOUNTER — Encounter (HOSPITAL_COMMUNITY)
Admission: RE | Admit: 2014-09-04 | Discharge: 2014-09-04 | Disposition: A | Discharge status: Home / Self Care | Payer: Medicare Other | Source: Ambulatory Visit | Attending: Nephrology | Admitting: Nephrology

## 2014-09-04 DIAGNOSIS — D638 Anemia in other chronic diseases classified elsewhere: Secondary | ICD-10-CM | POA: Diagnosis present

## 2014-09-04 MED ORDER — SODIUM CHLORIDE 0.9 % IV SOLN
Freq: Once | INTRAVENOUS | Status: AC
  Administered 2014-09-04: 250 mL via INTRAVENOUS

## 2014-09-05 ENCOUNTER — Other Ambulatory Visit (HOSPITAL_COMMUNITY): Payer: Medicare Other

## 2014-09-05 ENCOUNTER — Encounter (HOSPITAL_BASED_OUTPATIENT_CLINIC_OR_DEPARTMENT_OTHER): Payer: Medicare Other | Admitting: Hematology & Oncology

## 2014-09-05 ENCOUNTER — Encounter (HOSPITAL_COMMUNITY): Payer: Self-pay | Admitting: Hematology & Oncology

## 2014-09-05 VITALS — BP 142/72 | HR 64 | Resp 18 | Wt 252.0 lb

## 2014-09-05 DIAGNOSIS — D61818 Other pancytopenia: Secondary | ICD-10-CM | POA: Diagnosis not present

## 2014-09-05 DIAGNOSIS — N185 Chronic kidney disease, stage 5: Secondary | ICD-10-CM | POA: Diagnosis not present

## 2014-09-05 DIAGNOSIS — N184 Chronic kidney disease, stage 4 (severe): Secondary | ICD-10-CM

## 2014-09-05 DIAGNOSIS — Z9484 Stem cells transplant status: Secondary | ICD-10-CM

## 2014-09-05 DIAGNOSIS — D649 Anemia, unspecified: Secondary | ICD-10-CM | POA: Diagnosis not present

## 2014-09-05 DIAGNOSIS — C9000 Multiple myeloma not having achieved remission: Secondary | ICD-10-CM

## 2014-09-05 DIAGNOSIS — C9001 Multiple myeloma in remission: Secondary | ICD-10-CM

## 2014-09-05 DIAGNOSIS — Z992 Dependence on renal dialysis: Secondary | ICD-10-CM

## 2014-09-05 MED ORDER — LENALIDOMIDE 5 MG PO CAPS: ORAL_CAPSULE | ORAL | Status: DC

## 2014-09-05 NOTE — Patient Instructions (Signed)
.  [place] at [facility] Discharge Instructions  RECOMMENDATIONS MADE BY THE CONSULTANT AND ANY TEST RESULTS WILL BE SENT TO YOUR REFERRING PHYSICIAN.  We are going to start you on Revlimid  We will start the process for getting the drug  Return in 2 weeks  Thank you for choosing [place] [facility] to provide your oncology and hematology care.  To afford each patient quality time with our provider, please arrive at least 15 minutes before your scheduled appointment time.    You need to re-schedule your appointment should you arrive 10 or more minutes late.  We strive to give you quality time with our providers, and arriving late affects you and other patients whose appointments are after yours.  Also, if you no show three or more times for appointments you may be dismissed from the clinic at the providers discretion.     Again, thank you for choosing [facility].  Our hope is that these requests will decrease the amount of time that you wait before being seen by our physicians.       _____________________________________________________________  Should you have questions after your visit to [facility], please contact our office at [phone] between the hours of 8:30 a.m. and 4:30 p.m.  Voicemails left after 4:30 p.m. will not be returned until the following business day.  For prescription refill requests, have your pharmacy contact our office.

## 2014-09-07 LAB — TYPE AND SCREEN
ABO/RH(D): A POS
Antibody Screen: NEGATIVE
UNIT DIVISION: 0
Unit division: 0

## 2014-09-08 ENCOUNTER — Encounter (HOSPITAL_COMMUNITY): Payer: Self-pay | Admitting: Emergency Medicine

## 2014-09-08 ENCOUNTER — Emergency Department (HOSPITAL_COMMUNITY)
Admission: EM | Admit: 2014-09-08 | Discharge: 2014-09-08 | Disposition: A | Discharge status: Home / Self Care | Payer: Medicare Other | Attending: Emergency Medicine | Admitting: Emergency Medicine

## 2014-09-08 DIAGNOSIS — Z8739 Personal history of other diseases of the musculoskeletal system and connective tissue: Secondary | ICD-10-CM | POA: Diagnosis not present

## 2014-09-08 DIAGNOSIS — N186 End stage renal disease: Secondary | ICD-10-CM | POA: Insufficient documentation

## 2014-09-08 DIAGNOSIS — Z8639 Personal history of other endocrine, nutritional and metabolic disease: Secondary | ICD-10-CM | POA: Diagnosis not present

## 2014-09-08 DIAGNOSIS — Z862 Personal history of diseases of the blood and blood-forming organs and certain disorders involving the immune mechanism: Secondary | ICD-10-CM | POA: Insufficient documentation

## 2014-09-08 DIAGNOSIS — J209 Acute bronchitis, unspecified: Secondary | ICD-10-CM | POA: Diagnosis not present

## 2014-09-08 DIAGNOSIS — Z8579 Personal history of other malignant neoplasms of lymphoid, hematopoietic and related tissues: Secondary | ICD-10-CM | POA: Diagnosis not present

## 2014-09-08 DIAGNOSIS — I120 Hypertensive chronic kidney disease with stage 5 chronic kidney disease or end stage renal disease: Secondary | ICD-10-CM | POA: Diagnosis not present

## 2014-09-08 DIAGNOSIS — R0789 Other chest pain: Secondary | ICD-10-CM

## 2014-09-08 DIAGNOSIS — Z79899 Other long term (current) drug therapy: Secondary | ICD-10-CM | POA: Insufficient documentation

## 2014-09-08 DIAGNOSIS — Z992 Dependence on renal dialysis: Secondary | ICD-10-CM | POA: Insufficient documentation

## 2014-09-08 DIAGNOSIS — Z8719 Personal history of other diseases of the digestive system: Secondary | ICD-10-CM | POA: Diagnosis not present

## 2014-09-08 DIAGNOSIS — R079 Chest pain, unspecified: Secondary | ICD-10-CM | POA: Diagnosis present

## 2014-09-08 DIAGNOSIS — J40 Bronchitis, not specified as acute or chronic: Secondary | ICD-10-CM

## 2014-09-08 MED ORDER — DOXYCYCLINE HYCLATE 100 MG PO TABS
100.0000 mg | ORAL_TABLET | Freq: Once | ORAL | Status: AC
  Administered 2014-09-08: 100 mg via ORAL
  Filled 2014-09-08: qty 1

## 2014-09-08 MED ORDER — DOXYCYCLINE HYCLATE 100 MG PO CAPS: 100.0000 mg | ORAL_CAPSULE | Freq: Two times a day (BID) | ORAL | Status: DC

## 2014-09-08 NOTE — ED Notes (Signed)
Pt reports productive cough and right rib cage pain with movement and deep breaths for last several days. Pt denies any known fevers.

## 2014-09-08 NOTE — Discharge Instructions (Signed)
Antibiotic twice a day for 10 days. Take your pain medicine for the rib pain. Also recommend using your inhaler.

## 2014-09-08 NOTE — ED Provider Notes (Signed)
CSN: [id number]     Arrival date & time 09/08/14  1818 History   First MD Initiated Contact with Patient 09/08/14 1822     Chief Complaint  Patient presents with  . Rib Injury     (Consider location/radiation/quality/duration/timing/severity/associated sxs/prior Treatment) HPI...Marland KitchenMarland Kitchen Prodromal cough for several days with productive sputum. Minimal wheezing. Also complains of right lateral superior rib pain. Patient is ambulatory. Severity is mild. Pain is worse with deep breath. No fever, chills, rusty sputum, chest pain, dyspnea  Past Medical History  Diagnosis Date  . Hypertension   . Ruptured cervical disc   . Fall resulting in striking against other object     paralyzed  . Multiple myeloma   . 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 Multiple myeloma 01/26/2011  . Unspecified vitamin D deficiency 03/06/2013  . Anemia of chronic disease 12/30/2013  . Chronic renal disease, stage 5, glomerular filtration rate less than or equal to 15 mL/min/1.73 square meter 12/30/2013  . Constipation   . Dialysis patient 2016  . Thrombocytopenia 08/31/2014   Past Surgical History  Procedure Laterality Date  . Inguinal hernia repair Bilateral   . Anterior cervical decomp/discectomy fusion    . Bone marrow aspirate and biopsy wiith lumbar puncture    . Melanoma excision      rt. breast  . Cervical spine surgery    . Kyphoplasty Bilateral 01/30/2014    Procedure: Thoracic eleven Kyphoplasty;  Surgeon: Consuella Lose, MD;  Location: [facility];  Service: Neurosurgery;  Laterality: Bilateral;  Thoracic eleven Kyphoplasty  . Av fistula placement Right 04/30/2014    Procedure: BRACHIOCEPHALIC ARTERIOVENOUS (AV) FISTULA CREATION;  Surgeon: Conrad Moosic, MD;  Location: [facility];  Service: Vascular;  Laterality: Right;   Family History  Problem Relation Age of Onset  . Cancer Sister   . Cancer Brother    History  Substance Use Topics  . Smoking status: Never Smoker    . Smokeless tobacco: Never Used  . Alcohol Use: No    Review of Systems  All other systems reviewed and are negative.     Allergies  Pamidronate  Home Medications   Prior to Admission medications   Medication Sig Start Date End Date Taking? Authorizing Provider  docusate sodium (COLACE) 100 MG capsule Take 100 mg by mouth daily.     Historical Provider, MD  doxycycline (VIBRAMYCIN) 100 MG capsule Take 1 capsule (100 mg total) by mouth 2 (two) times daily. 09/08/14   Nat Christen, MD  furosemide (LASIX) 20 MG tablet Take 40 mg by mouth daily.    Historical Provider, MD  lenalidomide (REVLIMID) 5 MG capsule Take one tablet once daily, take after dialysis 09/05/14   [name], MD  metoprolol succinate (TOPROL XL) 25 MG 24 hr tablet Take 25 mg by mouth daily.  12/18/13   Historical Provider, MD  Omega-3 Fatty Acids (FISH OIL) 1000 MG CAPS Take 2 capsules by mouth daily.    Historical Provider, MD  oxyCODONE-acetaminophen (ROXICET) 5-325 MG per tablet Take 1 tablet by mouth every 6 (six) hours as needed for severe pain. Patient not taking: Reported on 08/31/2014 04/30/14   Alvia Grove, PA-C  pomalidomide (POMALYST) 1 MG capsule Take 1 tablet every other day 14 days and then 7 days off, then repeat Patient not taking: Reported on 08/13/2014 06/04/14   Baird Cancer, PA-C  pregabalin (LYRICA) 75 MG capsule Take 1 capsule (75 mg total) by mouth  2 (two) times daily. 08/13/14   Manon Hilding Kefalas, PA-C   BP 144/78 mmHg  Pulse 77  Temp(Src) 97.7 F (36.5 C) (Oral)  Resp 18  Ht _0  (1.803 m)  Wt 252 lb (114.306 kg)  BMI 35.16 kg/m2  SpO2 96% Physical Exam  Constitutional: He is oriented to person, place, and time. He appears well-developed and well-nourished.  HENT:  Head: Normocephalic and atraumatic.  Eyes: Conjunctivae and EOM are normal. Pupils are equal, round, and reactive to light.  Neck: Normal range of motion. Neck supple.  Cardiovascular: Normal rate and regular  rhythm.   Pulmonary/Chest: Effort normal and breath sounds normal.  Minimal tenderness right superior lateral chest wall  Abdominal: Soft. Bowel sounds are normal.  Musculoskeletal: Normal range of motion.  Neurological: He is alert and oriented to person, place, and time.  Skin: Skin is warm and dry.  Psychiatric: He has a normal mood and affect. His behavior is normal.  Nursing note and vitals reviewed.   ED Course  Procedures (including critical care time) Labs Review Labs Reviewed - No data to display  Imaging Review No results found.   EKG Interpretation None      MDM   Final diagnoses:  Bronchitis  Right-sided chest wall pain    Patient is hemodynamically stable. Good color. No dyspnea or tachypnea. Pulse ox normal. Will start Doxycycline 100 mg bid.  Patient has dialysis Tuesday Thursday Saturday    Nat Christen, MD 09/08/14 1900

## 2014-09-08 NOTE — ED Notes (Signed)
MD at bedside. 

## 2014-09-10 ENCOUNTER — Encounter (HOSPITAL_BASED_OUTPATIENT_CLINIC_OR_DEPARTMENT_OTHER): Payer: Medicare Other | Admitting: Oncology

## 2014-09-10 ENCOUNTER — Other Ambulatory Visit (HOSPITAL_COMMUNITY): Payer: Self-pay | Admitting: Oncology

## 2014-09-10 DIAGNOSIS — C9000 Multiple myeloma not having achieved remission: Secondary | ICD-10-CM

## 2014-09-10 NOTE — Progress Notes (Signed)
Patient is seen as a work-in.  He walked into the clinic demanding to see me.  He provided me his Pomalyst left overs.  I gave it to pharmacy for proper disposal.  "I can't take this pill everyday!  It will make me feel worse!"  He was concerned about the milligram dose of Revlimid as he was comparing it to the milligram dose of Pomalyst (5 mg compared to 1 mg).  I provided him education that he cannot compared milligrams with different medications.  I have recommended to Jakeb that he take Revlimid 5 mg days 1-21 with a 7 day respite.  He reports he will start today.  He complained about not feeling better after his transfusion.  He wants more.  I decline today.  Since he will be starting Revlimid tonight, I will perform labs next Monday with a CBC diff.  Return as scheduled with follow-up labs according to results next Monday.  Lab order placed.   Patient and plan discussed with Dr. Ancil Linsey and she is in agreement with the aforementioned.   KEFALAS,THOMAS 09/10/2014 2:17 PM

## 2014-09-12 ENCOUNTER — Telehealth (HOSPITAL_COMMUNITY): Payer: Self-pay | Admitting: Oncology

## 2014-09-12 NOTE — Telephone Encounter (Signed)
Patient called.  He reports that he spoke with his nephrologist about getting more blood product.  He reports that he was told to follow-up with his "cancer doctor" about that.  Typically we do not give blood when patient's are hemodialysis due to potential complications and restrictions most patient have on dialysis.  Additionally, it is typically time saving for the patient to receive the additional blood while undergoing hemodialysis.  With this information, I have agreed to give the patient blood transfusion next week.    KEFALAS,THOMAS 09/12/2014 4:19 PM

## 2014-09-13 NOTE — Patient Instructions (Addendum)
[facility]   CHEMOTHERAPY INSTRUCTIONS  Revlimid - chemo med - side effects: neutropenia (low white blood cells), thrombocytopenia (low platelets), deep vein thrombosis (blood clot in extremity), pulmonary embolism (blood clot in lung), itching, rash, dry skin, diarrhea, constipation, nausea, vomiting, fatigue, dizziness, headache, muscle cramps/aches, inflammation of the nose and throat, fever, upper respiratory tract infections, cough.   Females and Males  Do not share REVLIMID with other people. It may cause birth defects and other serious problems   Do not donate blood while you take REVLIMID, during any breaks (interruptions) in your treatment, and for 4 weeks after stopping REVLIMID. If someone who is pregnant gets your donated blood, her baby may be exposed to REVLIMID and may be born with birth defects  Low white blood cells (neutropenia) and low platelets (thrombocytopenia)  REVLIMID causes low white blood cells and low platelets in most people. You may need a blood transfusion or certain medicines if your blood counts drop too low   Your healthcare provider should check your blood counts often especially during the first several months of treatment with REVLIMID, and then at least monthly. Tell your healthcare provider if you develop any bleeding or bruising during treatment with REVLIMID  Blood clots  Blood clots in the arteries, veins, and lungs happen more often in people who take REVLIMID   Risk is even higher for people with multiple myeloma taking REVLIMID with dexamethasone   Heart attacks and stroke also happen more often in people taking REVLIMID with dexamethasone   To reduce this increased risk, most people who take REVLIMID will also take a blood thinner medicine   Before taking REVLIMID tell your healthcare provider: if you have had a blood clot in the past, have high blood pressure, you smoke, you have been told you have  high level of fat in your blood (hyperlipidemia), and all medicines you take. Certain other medicines can also increase your risk for blood clots   Call your healthcare provider or get medical help right away if you get any of the following signs or symptoms during treatment with REVLIMID: Blood clot in lung, arm or leg: shortness of breath, chest pain, or arm or leg swelling. Heart attack: chest pain that may spread to arms, neck, jaw, back or stomach area, feeling sweaty, shortness of breath, feeling sick or vomiting. Stroke: sudden numbness or weakness, especially on one side of the body, severe headache or confusion, or problems with vision, speech or balance   Other important information about REVLIMID   Swallow REVLIMID capsules whole with water once a day. Do not open, break, or chew your capsules   Do not open the REVLIMID capsules or handle them any more than needed. If you touch a broken REVLIMID capsule or the medicine in the capsule, wash the area of your body with soap and water   If you miss a dose of REVLIMID, and it has been less than 12 hours since your regular time, take it as soon as you remember. If it has been more than 12 hours, just skip your missed dose. Do not take 2 doses at the same time   Caregivers wear gloves when handling this medication  Leave medication in it's original packing (bottle, blister pack, etc) until it is ready for administration   *Your dosage of Revlimid will be 1 tablet (68m capsule) daily x 21 days in a row. Rest for 7 days (don't take Revlimid). Repeat cycle*.  EDUCATIONAL MATERIALS GIVEN AND REVIEWED: Specific Instructions Sheets: Revlimid   SELF CARE ACTIVITIES WHILE ON CHEMOTHERAPY: Increase your fluid intake 48 hours prior to treatment and drink at least 2 quarts per day after treatment., No alcohol intake., No aspirin or other medications unless approved by your oncologist., Eat foods that are light and easy  to digest., Eat foods at cold or room temperature., No fried, fatty, or spicy foods immediately before or after treatment., Have teeth cleaned professionally before starting treatment. Keep dentures and partial plates clean., Use soft toothbrush and do not use mouthwashes that contain alcohol. Biotene is a good mouthwash that is available at most pharmacies or may be ordered by calling [phone]., Use warm salt water gargles (1 teaspoon salt per 1 quart warm water) before and after meals and at bedtime. Or you may rinse with 2 tablespoons of three -percent hydrogen peroxide mixed in eight ounces of water., Always use sunscreen with SPF (Sun Protection Factor) of 30 or higher., Use your nausea medication as directed to prevent nausea., Use your stool softener or laxative as directed to prevent constipation. and Use your anti-diarrheal medication as directed to stop diarrhea.  Please wash your hands for at least 30 seconds using warm soapy water. Handwashing is the #1 way to prevent the spread of germs. Stay away from sick people or people who are getting over a cold. If you develop respiratory systems such as green/yellow mucus production or productive cough or persistent cough let us know and we will see if you need an antibiotic. It is a good idea to keep a pair of gloves on when going into grocery stores/[employer] to decrease your risk of coming into contact with germs on the carts, etc. Carry alcohol hand gel with you at all times and use it frequently if out in public. All foods need to be cooked thoroughly. No raw foods. No medium or undercooked meats, eggs. If your food is cooked medium well, it does not need to be hot pink or saturated with bloody liquid at all. Vegetables and fruits need to be washed/rinsed under the faucet with a dish detergent before being consumed. You can eat raw fruits and vegetables unless we tell you otherwise but it would be best if you cooked them or bought frozen. Do not eat  off of salad bars or hot bars unless you really trust the cleanliness of the restaurant. If you need dental work, please let Dr. Whitney Muse know before you go for your appointment so that we can coordinate the best possible time for you in regards to your chemo regimen. You need to also let your dentist know that you are actively taking chemo. We may need to do labs prior to your dental appointment. We also want your bowels moving at least every other day. If this is not happening, we need to know so that we can get you on a bowel regimen to help you go.      MEDICATIONS: You have been given prescriptions for the following medications:  Over-the-Counter Meds:   Revlimid 37m capsule. Take 1 capsule by mouth daily x 21 days in a row. Rest for 7 days (don't take Revlimid). Then repeat cycle.   Over-the-Counter Meds:  Senna - this is a mild laxative used to treat mild constipation. May take 4 tabs by mouth daily or up to twice a day as needed for mild constipation.  Milk of Magnesia - this is a laxative used to treat moderate to severe  constipation. May take 2-4 tablespoons every 8 hours as needed. May increase to 8 tablespoons x 1 dose and if no bowel movement call the [facility].  Imodium - this is for diarrhea. Take 2 tabs after 1st loose stool and then 1 tab every 2 hours until you go a total of 12 hours without a loose stool. Call [facility] if loose stools continue.      SYMPTOMS TO REPORT AS SOON AS POSSIBLE AFTER TREATMENT:  FEVER GREATER THAN 100.5 F  CHILLS WITH OR WITHOUT FEVER  NAUSEA AND VOMITING THAT IS NOT CONTROLLED WITH YOUR NAUSEA MEDICATION  UNUSUAL SHORTNESS OF BREATH  UNUSUAL BRUISING OR BLEEDING  TENDERNESS IN MOUTH AND THROAT WITH OR WITHOUT PRESENCE OF ULCERS  URINARY PROBLEMS  BOWEL PROBLEMS  UNUSUAL RASH    Wear comfortable clothing and clothing appropriate for easy access to any Portacath or PICC line. Let us know if there is anything that we can  do to make your therapy better!      I have been informed and understand all of the instructions given to me and have received a copy. I have been instructed to call the clinic [phone] or my family physician as soon as possible for continued medical care, if indicated. I do not have any more questions at this time but understand that I may call the [facility] or the Patient Navigator at [phone] during office hours should I have questions or need assistance in obtaining follow-up care.          Lenalidomide Oral Capsules What is this medicine? LENALIDOMIDE (len a LID oh mide) is a chemotherapy drug that targets specific proteins within cancer cells and stops the cancer cell from growing. It is used to treat multiple myeloma, mantle cell lymphoma, and some myelodysplastic syndromes that cause severe anemia requiring blood transfusions. This medicine may be used for other purposes; ask your health care provider or pharmacist if you have questions. COMMON BRAND NAME(S): Revlimid What should I tell my health care provider before I take this medicine? They need to know if you have any of these conditions: -blood clots in the legs or the lungs -high blood pressure -high cholesterol -infection -irregular monthly periods or menstrual cycles -kidney disease -liver disease -smoke tobacco -thyroid disease -an unusual or allergic reaction to lenalidomide, other medicines, foods, dyes, or preservatives -pregnant or trying to get pregnant -breast-feeding How should I use this medicine? Take this medicine by mouth with a glass of water. Follow the directions on the prescription label. Do not cut, crush, or chew this medicine. Take your medicine at regular intervals. Do not take it more often than directed. Do not stop taking except on your doctor's advice. A MedGuide will be given with each prescription and refill. Read this guide carefully each time. The MedGuide may change  frequently. Talk to your pediatrician regarding the use of this medicine in children. Special care may be needed. Overdosage: If you think you have taken too much of this medicine contact a poison control center or emergency room at once. NOTE: This medicine is only for you. Do not share this medicine with others. What if I miss a dose? If you miss a dose, take it as soon as you can. If your next dose is to be taken in less than 12 hours, then do not take the missed dose. Take the next dose at your regular time. Do not take double or extra doses. What may interact with  this medicine? This medicine may interact with the following medications: -digoxin -medicines that increase the risk of thrombosis like estrogens or erythropoietic agents (e.g., epoetin alfa and darbepoetin alfa) -warfarin This list may not describe all possible interactions. Give your health care provider a list of all the medicines, herbs, non-prescription drugs, or dietary supplements you use. Also tell them if you smoke, drink alcohol, or use illegal drugs. Some items may interact with your medicine. What should I watch for while using this medicine? Visit your doctor for regular check ups. Tell your doctor or healthcare professional if your symptoms do not start to get better or if they get worse. You will need to have important blood work done while you are taking this medicine. This medicine is available only through a special program. Doctors, pharmacies, and patients must meet all of the conditions of the program. Your health care provider will help you get signed up with the program if you need this medicine. Through the program you will only receive up to a 28 day supply of the medicine at one time. You will need a new prescription for each refill. This medicine can cause birth defects. Do not get pregnant while taking this drug. Females with child-bearing potential will need to have 2 negative pregnancy tests before starting  this medicine. Pregnancy testing must be done every 2 to 4 weeks as directed while taking this medicine. Use 2 reliable forms of birth control together while you are taking this medicine and for 1 month after you stop taking this medicine. If you think that you might be pregnant talk to your doctor right away. Men must use a latex condom during sexual contact with a woman while taking this medicine and for 28 days after you stop taking this medicine. A latex condom is needed even if you have had a vasectomy. Contact your doctor right away if your partner becomes pregnant. Do not donate sperm while taking this medicine and for 28 days after you stop taking this medicine. Do not give blood while taking the medicine and for 1 month after completion of treatment to avoid exposing pregnant women to the medicine through the donated blood. Talk to your doctor about your risk of cancer. You may be more at risk for certain types of cancers if you take this medicine. What side effects may I notice from receiving this medicine? Side effects that you should report to your doctor or health care professional as soon as possible: -allergic reactions like skin rash, itching or hives, swelling of the face, lips, or tongue -breathing problems -chest pain or tightness -fast, irregular heartbeat -low blood counts - this medicine may decrease the number of white blood cells, red blood cells and platelets. You may be at increased risk for infections and bleeding. -seizures -signs and symptoms of bleeding such as bloody or black, tarry stools; red or dark-brown urine; spitting up blood or brown material that looks like coffee grounds; red spots on the skin; unusual bruising or bleeding from the eye, gums, or nose -signs and symptoms of a blood clot such as breathing problems; changes in vision; chest pain; severe, sudden headache; pain, swelling, warmth in the leg; trouble speaking; sudden numbness or weakness of the face,  arm or leg -signs and symptoms of liver injury like dark yellow or brown urine; general ill feeling or flu-like symptoms; light-colored stools; loss of appetite; nausea; right upper belly pain; unusually weak or tired; yellowing of the eyes or skin -signs and  symptoms of a stroke like changes in vision; confusion; trouble speaking or understanding; severe headaches; sudden numbness or weakness of the face, arm or leg; trouble walking; dizziness; loss of balance or coordination -sweating -vomiting Side effects that usually do not require medical attention (report to your doctor or health care professional if they continue or are bothersome): -constipation -cough -diarrhea -tiredness This list may not describe all possible side effects. Call your doctor for medical advice about side effects. You may report side effects to FDA at 1-800-FDA-1088. Where should I keep my medicine? Keep out of the reach of children. Store at room temperature between 15 and 30 degrees C (59 and 86 degrees F). Throw away any unused medicine after the expiration date. NOTE: This sheet is a summary. It may not cover all possible information. If you have questions about this medicine, talk to your doctor, pharmacist, or health care provider.  2015, Elsevier/Gold Standard. (2013-08-08 18:30:01)

## 2014-09-14 ENCOUNTER — Encounter (HOSPITAL_COMMUNITY): Payer: Self-pay | Admitting: Oncology

## 2014-09-14 ENCOUNTER — Encounter (HOSPITAL_BASED_OUTPATIENT_CLINIC_OR_DEPARTMENT_OTHER): Payer: Medicare Other | Admitting: Oncology

## 2014-09-14 ENCOUNTER — Encounter (HOSPITAL_BASED_OUTPATIENT_CLINIC_OR_DEPARTMENT_OTHER): Payer: Medicare Other

## 2014-09-14 VITALS — Wt 245.2 lb

## 2014-09-14 DIAGNOSIS — D6189 Other specified aplastic anemias and other bone marrow failure syndromes: Secondary | ICD-10-CM

## 2014-09-14 DIAGNOSIS — L270 Generalized skin eruption due to drugs and medicaments taken internally: Secondary | ICD-10-CM

## 2014-09-14 DIAGNOSIS — R21 Rash and other nonspecific skin eruption: Secondary | ICD-10-CM

## 2014-09-14 DIAGNOSIS — D696 Thrombocytopenia, unspecified: Secondary | ICD-10-CM

## 2014-09-14 DIAGNOSIS — C9000 Multiple myeloma not having achieved remission: Secondary | ICD-10-CM | POA: Diagnosis not present

## 2014-09-14 DIAGNOSIS — N185 Chronic kidney disease, stage 5: Secondary | ICD-10-CM

## 2014-09-14 LAB — CBC WITH DIFFERENTIAL/PLATELET
BASOS ABS: 0 10*3/uL (ref 0.0–0.1)
BASOS PCT: 0 % (ref 0–1)
EOS ABS: 0.1 10*3/uL (ref 0.0–0.7)
Eosinophils Relative: 2 % (ref 0–5)
HCT: 26.3 % — ABNORMAL LOW (ref 39.0–52.0)
Hemoglobin: 8.2 g/dL — ABNORMAL LOW (ref 13.0–17.0)
LYMPHS ABS: 1.5 10*3/uL (ref 0.7–4.0)
Lymphocytes Relative: 40 % (ref 12–46)
MCH: 34.9 pg — ABNORMAL HIGH (ref 26.0–34.0)
MCHC: 31.2 g/dL (ref 30.0–36.0)
MCV: 111.9 fL — ABNORMAL HIGH (ref 78.0–100.0)
MONO ABS: 0.3 10*3/uL (ref 0.1–1.0)
Monocytes Relative: 7 % (ref 3–12)
NEUTROS PCT: 51 % (ref 43–77)
Neutro Abs: 1.9 10*3/uL (ref 1.7–7.7)
Platelets: 25 10*3/uL — CL (ref 150–400)
RBC: 2.35 MIL/uL — ABNORMAL LOW (ref 4.22–5.81)
RDW: 17.8 % — ABNORMAL HIGH (ref 11.5–15.5)
Smear Review: DECREASED
WBC: 3.8 10*3/uL — ABNORMAL LOW (ref 4.0–10.5)

## 2014-09-14 LAB — PREPARE RBC (CROSSMATCH)

## 2014-09-14 MED ORDER — HYDROCORTISONE 0.5 % EX CREA: 1.0000 "application " | TOPICAL_CREAM | Freq: Two times a day (BID) | CUTANEOUS | Status: DC

## 2014-09-14 NOTE — Progress Notes (Signed)
CRITICAL VALUE ALERT  Critical value received:  Platelets 25,000  Date of notification:  09/14/14  Time of notification:  1694   Critical value read back:Yes.    Nurse who received alert:  A. Ouida Sills, RN  MD notified:  Caroleen Hamman, PA at [phone]

## 2014-09-14 NOTE — Progress Notes (Signed)
Patient is seen as a walk-in.  He reports a rash.  Patient presented to ED on 09/08/2014 with symptoms of URI.  He reported at that time green sputum production.  He was treated with Doxycycline.    He reports that he started his Revlimid on 4/25.  He notes that his pruritic rash started yesterday.  It is on his upper chest and back.  It appears to be a drug rash.  His URI symptoms are completely resolved and therefore, he will D/C Doxycycline.  Since I am unable to decipher which medication caused the rash, I will ask him to also hold Revlimid.  "Is there something you can give me for itching?"  Since he is an active man, I will give him some Hydrocortisone cream to be applied BID to affected areas.   He will return as scheduled for 2 units of blood on Monday.  Additionally, I will give 1 unit of platelets on Monday.  He will be pre-medicated and also receive 20 mg of Lasix between units and after transfusion.  He is urinating.  Patient and plan discussed with Dr. Ancil Linsey and she is in agreement with the aforementioned.   KEFALAS,THOMAS 09/14/2014 11:28 AM

## 2014-09-14 NOTE — Patient Instructions (Signed)
[place] at [facility]  Discharge Instructions:  Stop Doxycycline Hold Revlimid ("Chemo-pills") Return on Monday for blood _______________________________________________________________  Thank you for choosing [place] at [facility] to provide your oncology and hematology care.  To afford each patient quality time with our providers, please arrive at least 15 minutes before your scheduled appointment.  You need to re-schedule your appointment if you arrive 10 or more minutes late.  We strive to give you quality time with our providers, and arriving late affects you and other patients whose appointments are after yours.  Also, if you no show three or more times for appointments you may be dismissed from the clinic.  Again, thank you for choosing [place] [facility] hope is that these requests will allow you access to exceptional care and in a timely manner. _______________________________________________________________  If you have questions after your visit, please contact our office at [phone] between the hours of 8:30 a.m. and 5:00 p.m. Voicemails left after 4:30 p.m. will not be returned until the following business day. _______________________________________________________________  For prescription refill requests, have your pharmacy contact our office. _______________________________________________________________  Recommendations made by the consultant and any test results will be sent to your referring physician. _______________________________________________________________

## 2014-09-17 ENCOUNTER — Encounter (HOSPITAL_COMMUNITY): Payer: Medicare Other

## 2014-09-17 ENCOUNTER — Other Ambulatory Visit (HOSPITAL_COMMUNITY): Payer: Medicare Other

## 2014-09-17 ENCOUNTER — Encounter (HOSPITAL_COMMUNITY): Payer: Medicare Other | Attending: Oncology

## 2014-09-17 VITALS — BP 175/90 | HR 58 | Temp 98.0°F | Resp 16

## 2014-09-17 DIAGNOSIS — D6189 Other specified aplastic anemias and other bone marrow failure syndromes: Secondary | ICD-10-CM

## 2014-09-17 DIAGNOSIS — N185 Chronic kidney disease, stage 5: Secondary | ICD-10-CM

## 2014-09-17 DIAGNOSIS — C9000 Multiple myeloma not having achieved remission: Secondary | ICD-10-CM | POA: Insufficient documentation

## 2014-09-17 DIAGNOSIS — D696 Thrombocytopenia, unspecified: Secondary | ICD-10-CM | POA: Diagnosis not present

## 2014-09-17 MED ORDER — FUROSEMIDE 10 MG/ML IJ SOLN
INTRAMUSCULAR | Status: AC
  Filled 2014-09-17: qty 2

## 2014-09-17 MED ORDER — SODIUM CHLORIDE 0.9 % IJ SOLN
10.0000 mL | INTRAMUSCULAR | Status: AC | PRN
  Administered 2014-09-17: 10 mL

## 2014-09-17 MED ORDER — SODIUM CHLORIDE 0.9 % IV SOLN
250.0000 mL | Freq: Once | INTRAVENOUS | Status: AC
  Administered 2014-09-17: 250 mL via INTRAVENOUS

## 2014-09-17 MED ORDER — FUROSEMIDE 10 MG/ML IJ SOLN
20.0000 mg | Freq: Once | INTRAMUSCULAR | Status: AC
  Administered 2014-09-17: 20 mg via INTRAVENOUS

## 2014-09-17 MED ORDER — FUROSEMIDE 10 MG/ML IJ SOLN
20.0000 mg | Freq: Once | INTRAMUSCULAR | Status: AC
  Administered 2014-09-17: 20 mg via INTRAVENOUS
  Filled 2014-09-17: qty 2

## 2014-09-17 NOTE — Progress Notes (Signed)
Revlimid side effects including blood clots reviewed with patient. Other than the blood clot information patient said he knew all about this drug. He signed consent for it. I asked him if he needed a calendar and he said that he knew how to take his medication.

## 2014-09-17 NOTE — Progress Notes (Signed)
Labs drawn

## 2014-09-17 NOTE — Patient Instructions (Signed)
[place] at [facility] Discharge Instructions  RECOMMENDATIONS MADE BY THE CONSULTANT AND ANY TEST RESULTS WILL BE SENT TO YOUR REFERRING PHYSICIAN.  Today you received a transfusion of 2 units packed red blood cells and 1 unit platelets. You received a total of 40 mg IV Lasix along with your blood transfusion. Return as scheduled.  Thank you for choosing [place] at [facility] to provide your oncology and hematology care.  To afford each patient quality time with our provider, please arrive at least 15 minutes before your scheduled appointment time.    You need to re-schedule your appointment should you arrive 10 or more minutes late.  We strive to give you quality time with our providers, and arriving late affects you and other patients whose appointments are after yours.  Also, if you no show three or more times for appointments you may be dismissed from the clinic at the providers discretion.     Again, thank you for choosing [facility].  Our hope is that these requests will decrease the amount of time that you wait before being seen by our physicians.       _____________________________________________________________  Should you have questions after your visit to [facility], please contact our office at [phone] between the hours of 8:30 a.m. and 4:30 p.m.  Voicemails left after 4:30 p.m. will not be returned until the following business day.  For prescription refill requests, have your pharmacy contact our office.

## 2014-09-17 NOTE — Progress Notes (Signed)
Tolerated first unit packed red blood cell transfusion without incident. Tolerated second unit packed red blood cell transfusion without incident. Tolerated platelet transfusion without incident.

## 2014-09-18 LAB — TYPE AND SCREEN
ABO/RH(D): A POS
Antibody Screen: NEGATIVE
UNIT DIVISION: 0
UNIT DIVISION: 0

## 2014-09-18 LAB — PREPARE PLATELET PHERESIS: Unit division: 0

## 2014-09-21 ENCOUNTER — Encounter (HOSPITAL_COMMUNITY): Payer: Self-pay | Admitting: Oncology

## 2014-09-21 ENCOUNTER — Encounter (HOSPITAL_BASED_OUTPATIENT_CLINIC_OR_DEPARTMENT_OTHER): Payer: Medicare Other | Admitting: Oncology

## 2014-09-21 VITALS — BP 128/61 | HR 60 | Temp 98.9°F | Resp 18 | Wt 245.6 lb

## 2014-09-21 DIAGNOSIS — G629 Polyneuropathy, unspecified: Secondary | ICD-10-CM

## 2014-09-21 DIAGNOSIS — Z9481 Bone marrow transplant status: Secondary | ICD-10-CM | POA: Diagnosis not present

## 2014-09-21 DIAGNOSIS — C9000 Multiple myeloma not having achieved remission: Secondary | ICD-10-CM

## 2014-09-21 MED ORDER — PREGABALIN 75 MG PO CAPS: 75.0000 mg | ORAL_CAPSULE | Freq: Two times a day (BID) | ORAL | Status: DC

## 2014-09-21 NOTE — Patient Instructions (Signed)
[place] at [facility] Discharge Instructions  RECOMMENDATIONS MADE BY THE CONSULTANT AND ANY TEST RESULTS WILL BE SENT TO YOUR REFERRING PHYSICIAN.  Exam and discussion by Robynn Pane, PA-C. Restart revlimid tonight. Call with any concerns.  Follow-up in 1 - 2 weeks to see PA and 4 weeks to see Dr. Whitney Muse.  Thank you for choosing [place] at [facility] to provide your oncology and hematology care.  To afford each patient quality time with our provider, please arrive at least 15 minutes before your scheduled appointment time.    You need to re-schedule your appointment should you arrive 10 or more minutes late.  We strive to give you quality time with our providers, and arriving late affects you and other patients whose appointments are after yours.  Also, if you no show three or more times for appointments you may be dismissed from the clinic at the providers discretion.     Again, thank you for choosing [facility].  Our hope is that these requests will decrease the amount of time that you wait before being seen by our physicians.       _____________________________________________________________  Should you have questions after your visit to [facility], please contact our office at [phone] between the hours of 8:30 a.m. and 4:30 p.m.  Voicemails left after 4:30 p.m. will not be returned until the following business day.  For prescription refill requests, have your pharmacy contact our office.

## 2014-09-21 NOTE — Progress Notes (Signed)
Gregory Kilts, MD [place] [address]  Multiple myeloma  Neuropathy - Plan: pregabalin (LYRICA) 75 MG capsule, DISCONTINUED: pregabalin (LYRICA) 75 MG capsule  CURRENT THERAPY: Revlimid on hold due to possible drug rash from Revlimid versus Doxycycline.  INTERVAL HISTORY: [name] 69 y.o. male returns for followup of multiple myeloma, S/P 2 autologous BM transplant at [facility] under the guidance of Dr. Marcell Anger (retired) with the second bone marrow transplant occuring in July 2013. Recent bone marrow demonstrates 5% plasma cells and he was seen by Dr. Norma Fredrickson who recommended starting Revlimid maintenance at a dose of 5 mg 21/28 days.      Multiple myeloma   01/26/2011 Initial Diagnosis Multiple myeloma   09/10/2014 - 09/14/2014 Chemotherapy Revlimid 5 mg days 1-21 every 28 days   09/14/2014 Adverse Reaction Rash, Revlimid versus Doxycycline   09/21/2014 -  Chemotherapy Revlimid 5 mg days 1-21 every 28 days- rechallenge.   I personally reviewed and went over laboratory results with the patient.  The results are noted within this dictation.  His rash he reports resolved immediately after discontinuing Revlimid and Doxycycline.  He notes that he only used the steroid cream 1 time.    He is prepared to restart the Revlimid.  "I was gonna restart it last week."  I will re-challenge him with Revlimid.  He declines blood work today.  Hematologically, he denies any complaints and ROS questioning is negative.  Past Medical History  Diagnosis Date  . Hypertension   . Ruptured cervical disc   . Fall resulting in striking against other object     paralyzed  . Multiple myeloma   . 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 Multiple myeloma 01/26/2011  . Unspecified vitamin D deficiency 03/06/2013  . Anemia of chronic disease 12/30/2013  . Chronic renal disease, stage 5, glomerular filtration rate less than or equal to 15  mL/min/1.73 square meter 12/30/2013  . Constipation   . Dialysis patient 2016  . Thrombocytopenia 08/31/2014    has ESSENTIAL HYPERTENSION, BENIGN; BRADYCARDIA; Multiple myeloma; Intravenous pamidronate causing adverse effect in therapeutic use; Unspecified vitamin D deficiency; Lymphedema of lower extremity; Anemia of chronic disease; Chronic renal disease, stage 5, glomerular filtration rate less than or equal to 15 mL/min/1.73 square meter; Pathologic fracture; Anemia in chronic kidney disease; and Thrombocytopenia on his problem list.     is allergic to pamidronate.  Mr. Latulippe had no medications administered during this visit.  Past Surgical History  Procedure Laterality Date  . Inguinal hernia repair Bilateral   . Anterior cervical decomp/discectomy fusion    . Bone marrow aspirate and biopsy wiith lumbar puncture    . Melanoma excision      rt. breast  . Cervical spine surgery    . Kyphoplasty Bilateral 01/30/2014    Procedure: Thoracic eleven Kyphoplasty;  Surgeon: Consuella Lose, MD;  Location: [facility];  Service: Neurosurgery;  Laterality: Bilateral;  Thoracic eleven Kyphoplasty  . Av fistula placement Right 04/30/2014    Procedure: BRACHIOCEPHALIC ARTERIOVENOUS (AV) FISTULA CREATION;  Surgeon: Conrad South Roxana, MD;  Location: [place];  Service: Vascular;  Laterality: Right;    Denies any headaches, dizziness, double vision, fevers, chills, night sweats, nausea, vomiting, diarrhea, constipation, chest pain, heart palpitations, shortness of breath, blood in stool, black tarry stool, urinary pain, urinary burning, urinary frequency, hematuria.   PHYSICAL EXAMINATION  ECOG PERFORMANCE STATUS: 0 - Asymptomatic  Filed Vitals:   09/21/14 1336  BP: 128/61  Pulse: 60  Temp: 98.9 F (37.2 C)  Resp: 18    GENERAL:alert, no distress, well nourished, well developed, comfortable, cooperative, obese and smiling SKIN: skin color, texture, turgor are normal, no rashes or  significant lesions HEAD: Normocephalic, No masses, lesions, tenderness or abnormalities EYES: normal, PERRLA, EOMI, Conjunctiva are pink and non-injected EARS: External ears normal OROPHARYNX:lips, buccal mucosa, and tongue normal and mucous membranes are moist  NECK: supple, no adenopathy, thyroid normal size, non-tender, without nodularity, trachea midline LYMPH:  not examined BREAST:not examined LUNGS: not examined HEART: not examined ABDOMEN:not examined BACK: Back symmetric, no curvature. EXTREMITIES:less then 2 second capillary refill, no skin discoloration, no cyanosis  NEURO: alert & oriented x 3 with fluent speech, no focal motor/sensory deficits, gait normal   LABORATORY DATA: CBC    Component Value Date/Time   WBC 3.8* 09/14/2014 1042   RBC 2.35* 09/14/2014 1042   HGB 8.2* 09/14/2014 1042   HCT 26.3* 09/14/2014 1042   PLT 25* 09/14/2014 1042   MCV 111.9* 09/14/2014 1042   MCH 34.9* 09/14/2014 1042   MCHC 31.2 09/14/2014 1042   RDW 17.8* 09/14/2014 1042   LYMPHSABS 1.5 09/14/2014 1042   MONOABS 0.3 09/14/2014 1042   EOSABS 0.1 09/14/2014 1042   BASOSABS 0.0 09/14/2014 1042      Chemistry      Component Value Date/Time   NA 140 09/01/2014 1201   K 4.7 09/01/2014 1201   CL 106 09/01/2014 1201   CO2 23 09/01/2014 1201   BUN 51* 09/01/2014 1201   CREATININE 5.41* 09/01/2014 1201   CREATININE 43.09* 01/26/2012 1052      Component Value Date/Time   CALCIUM 8.0* 09/01/2014 1201   ALKPHOS 43 09/01/2014 1201   AST 30 09/01/2014 1201   ALT 30 09/01/2014 1201   BILITOT 0.8 09/01/2014 1201       ASSESSMENT AND PLAN:  Multiple myeloma multiple myeloma, S/P 2 autologous BM transplant at [facility] under the guidance of Dr. Marcell Anger (retired) with the second bone marrow transplant occuring in July 2013. Recent bone marrow demonstrates 5% plasma cells and he was seen by Dr. Norma Fredrickson who recommended starting Revlimid maintenance at a dose of 5 mg 21/28 days beginning on  09/10/2014.  Treatment was complicated by a drug rash that was noted on a walk-in visit on 4/29.  Identifying the medication culprit was complicated by a recent start of Doxycycline.  Doxycycline discontinued.  Revlimid held.    I will re-challenge him with the same dose of Revlimid.    He declines lab work today to evaluate his Hgb and platelet count.  Will provide blood products to maintain platelet count > 20,000 and Hgb > 8.0 g/dL.  Labs every 4 weeks: CBC diff, CMET, LDH, ESR, CRP, MM panel, B2M  Return in 7-14 days for follow-up to evaluate tolerance to therapy.    THERAPY PLAN:  Will re-challenge Revlimid at same dose and frequency (5 mg 21/28 days).  All questions were answered. The patient knows to call the clinic with any problems, questions or concerns. We can certainly see the patient much sooner if necessary.  Patient and plan discussed with Dr. Ancil Linsey and she is in agreement with the aforementioned.   This note is electronically signed by: Robynn Pane 09/21/2014 2:44 PM

## 2014-09-21 NOTE — Assessment & Plan Note (Addendum)
multiple myeloma, S/P 2 autologous BM transplant at [facility] under the guidance of Dr. Marcell Anger (retired) with the second bone marrow transplant occuring in July 2013. Recent bone marrow demonstrates 5% plasma cells and he was seen by Dr. Norma Fredrickson who recommended starting Revlimid maintenance at a dose of 5 mg 21/28 days beginning on 09/10/2014.  Treatment was complicated by a drug rash that was noted on a walk-in visit on 4/29.  Identifying the medication culprit was complicated by a recent start of Doxycycline.  Doxycycline discontinued.  Revlimid held.    I will re-challenge him with the same dose of Revlimid.    He declines lab work today to evaluate his Hgb and platelet count.  Will provide blood products to maintain platelet count > 20,000 and Hgb > 8.0 g/dL.  Labs every 4 weeks: CBC diff, CMET, LDH, ESR, CRP, MM panel, B2M  Return in 7-14 days for follow-up to evaluate tolerance to therapy.

## 2014-09-25 ENCOUNTER — Emergency Department (HOSPITAL_COMMUNITY): Payer: Medicare Other

## 2014-09-25 ENCOUNTER — Emergency Department (HOSPITAL_COMMUNITY)
Admission: EM | Admit: 2014-09-25 | Discharge: 2014-09-26 | Disposition: A | Discharge status: Home / Self Care | Payer: Medicare Other | Attending: Emergency Medicine | Admitting: Emergency Medicine

## 2014-09-25 ENCOUNTER — Encounter (HOSPITAL_COMMUNITY): Payer: Self-pay | Admitting: Emergency Medicine

## 2014-09-25 DIAGNOSIS — J209 Acute bronchitis, unspecified: Secondary | ICD-10-CM | POA: Diagnosis not present

## 2014-09-25 DIAGNOSIS — Z8739 Personal history of other diseases of the musculoskeletal system and connective tissue: Secondary | ICD-10-CM | POA: Diagnosis not present

## 2014-09-25 DIAGNOSIS — Z862 Personal history of diseases of the blood and blood-forming organs and certain disorders involving the immune mechanism: Secondary | ICD-10-CM | POA: Insufficient documentation

## 2014-09-25 DIAGNOSIS — I120 Hypertensive chronic kidney disease with stage 5 chronic kidney disease or end stage renal disease: Secondary | ICD-10-CM | POA: Insufficient documentation

## 2014-09-25 DIAGNOSIS — J40 Bronchitis, not specified as acute or chronic: Secondary | ICD-10-CM

## 2014-09-25 DIAGNOSIS — K5900 Constipation, unspecified: Secondary | ICD-10-CM | POA: Insufficient documentation

## 2014-09-25 DIAGNOSIS — Z8579 Personal history of other malignant neoplasms of lymphoid, hematopoietic and related tissues: Secondary | ICD-10-CM | POA: Diagnosis not present

## 2014-09-25 DIAGNOSIS — Z792 Long term (current) use of antibiotics: Secondary | ICD-10-CM | POA: Insufficient documentation

## 2014-09-25 DIAGNOSIS — Z7952 Long term (current) use of systemic steroids: Secondary | ICD-10-CM | POA: Diagnosis not present

## 2014-09-25 DIAGNOSIS — N186 End stage renal disease: Secondary | ICD-10-CM | POA: Diagnosis not present

## 2014-09-25 DIAGNOSIS — R05 Cough: Secondary | ICD-10-CM | POA: Diagnosis present

## 2014-09-25 DIAGNOSIS — J9801 Acute bronchospasm: Secondary | ICD-10-CM

## 2014-09-25 DIAGNOSIS — Z992 Dependence on renal dialysis: Secondary | ICD-10-CM | POA: Insufficient documentation

## 2014-09-25 DIAGNOSIS — Z79899 Other long term (current) drug therapy: Secondary | ICD-10-CM | POA: Insufficient documentation

## 2014-09-25 MED ORDER — ALBUTEROL SULFATE (2.5 MG/3ML) 0.083% IN NEBU
5.0000 mg | INHALATION_SOLUTION | Freq: Once | RESPIRATORY_TRACT | Status: AC
  Administered 2014-09-25: 5 mg via RESPIRATORY_TRACT
  Filled 2014-09-25: qty 6

## 2014-09-25 MED ORDER — ACETAMINOPHEN 500 MG PO TABS
1000.0000 mg | ORAL_TABLET | Freq: Once | ORAL | Status: AC
  Administered 2014-09-26: 1000 mg via ORAL
  Filled 2014-09-25: qty 2

## 2014-09-25 NOTE — ED Notes (Signed)
Pt c/o cough for a day. Pt had dialysis last today.

## 2014-09-25 NOTE — ED Notes (Signed)
Patient transported to X-ray 

## 2014-09-25 NOTE — ED Provider Notes (Signed)
CSN: [id number]     Arrival date & time 09/25/14  2217 History   First MD Initiated Contact with Patient 09/25/14 2303    This chart was scribed for Gregory Schmidt, MD by Terressa Koyanagi, ED Scribe. This patient was seen in room APA10/APA10 and the patient's care was started at 11:28 PM.  Chief Complaint  Patient presents with  . Cough   The history is provided by the patient. No language interpreter was used.   PCP: Gregory Kilts, MD HPI Comments: [name] is a 69 y.o. male, with PMH noted below including renal failure and dialysis on Tuesday, Thursday and Saturday (last dialysis completed today) who presents to the Emergency Department complaining of intermittent cough with associated diffused weakness onset a few days ago. Pt also reports one episode of vomiting yesterday; specifying it was not due to a cough fit. Pt states the episodes of coughing is sporadic and not aggravated by any specific circumstance/activity. Pt reports using cough drops today without relief.   Pt denies: fever; diarrhea; chest pain; SOB; Hx of heart problems; decreased intake PO; working outside and around pesticides; or any other Sx at this time.   Past Medical History  Diagnosis Date  . Hypertension   . Ruptured cervical disc   . Fall resulting in striking against other object     paralyzed  . Multiple myeloma   . 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 Multiple myeloma 01/26/2011  . Unspecified vitamin D deficiency 03/06/2013  . Anemia of chronic disease 12/30/2013  . Chronic renal disease, stage 5, glomerular filtration rate less than or equal to 15 mL/min/1.73 square meter 12/30/2013  . Constipation   . Dialysis patient 2016  . Thrombocytopenia 08/31/2014   Past Surgical History  Procedure Laterality Date  . Inguinal hernia repair Bilateral   . Anterior cervical decomp/discectomy fusion    . Bone marrow aspirate and biopsy wiith lumbar puncture    . Melanoma  excision      rt. breast  . Cervical spine surgery    . Kyphoplasty Bilateral 01/30/2014    Procedure: Thoracic eleven Kyphoplasty;  Surgeon: Gregory Lose, MD;  Location: [facility];  Service: Neurosurgery;  Laterality: Bilateral;  Thoracic eleven Kyphoplasty  . Av fistula placement Right 04/30/2014    Procedure: BRACHIOCEPHALIC ARTERIOVENOUS (AV) FISTULA CREATION;  Surgeon: Gregory Fowl[place], MD;  Location: [place];  Service: Vascular;  Laterality: Right;   Family History  Problem Relation Age of Onset  . Cancer Sister   . Cancer Brother    History  Substance Use Topics  . Smoking status: Never Smoker   . Smokeless tobacco: Never Used  . Alcohol Use: No    Review of Systems A complete 10 system review of systems was obtained and all systems are negative except as noted in the HPI and PMH.     Allergies  Pamidronate  Home Medications   Prior to Admission medications   Medication Sig Start Date End Date Taking? Authorizing Provider  docusate sodium (COLACE) 100 MG capsule Take 100 mg by mouth daily.     Historical Provider, MD  doxycycline (VIBRAMYCIN) 100 MG capsule Take 1 capsule (100 mg total) by mouth 2 (two) times daily. Patient not taking: Reported on 09/21/2014 09/08/14   Nat Christen, MD  furosemide (LASIX) 20 MG tablet Take 40 mg by mouth daily.    Historical Provider, MD  hydrocortisone cream 0.5 % Apply 1 application topically  2 (two) times daily. Patient not taking: Reported on 09/21/2014 09/14/14   Pa-C, PA-C  lenalidomide (REVLIMID) 5 MG capsule Take one tablet once daily, take after dialysis Patient not taking: Reported on 09/21/2014 09/05/14   Gregory Ranks, MD  metoprolol succinate (TOPROL XL) 25 MG 24 hr tablet Take 25 mg by mouth daily.  12/18/13   Historical Provider, MD  Omega-3 Fatty Acids (FISH OIL) 1000 MG CAPS Take 2 capsules by mouth daily.    Historical Provider, MD  oxyCODONE-acetaminophen (ROXICET) 5-325 MG per tablet Take 1 tablet by mouth every 6 (six)  hours as needed for severe pain. Patient not taking: Reported on 08/31/2014 04/30/14   Pa-C, PA-C  pregabalin (LYRICA) 75 MG capsule Take 1 capsule (75 mg total) by mouth 2 (two) times daily. 09/21/14   Pa-C, PA-C  torsemide (DEMADEX) 20 MG tablet  09/18/14   Historical Provider, MD   Triage Vitals: BP 127/67 mmHg  Pulse 70  Temp(Src) 100.4 F (38 C)  Resp 21  Ht 5' 11"  (1.803 m)  Wt 242 lb (109.77 kg)  BMI 33.77 kg/m2  SpO2 94% Physical Exam  Constitutional: He is oriented to person, place, and time. He appears well-developed and well-nourished.  HENT:  Head: Normocephalic.  Eyes: EOM are normal.  Neck: Normal range of motion.  Pulmonary/Chest: Effort normal. He has wheezes (mild wheezing ). He has no rhonchi.  Abdominal: He exhibits no distension.  Musculoskeletal: Normal range of motion.  Neurological: He is alert and oriented to person, place, and time.  Psychiatric: He has a normal mood and affect.  Nursing note and vitals reviewed.   ED Course  Procedures (including critical care time) DIAGNOSTIC STUDIES: Oxygen Saturation is 94% on RA, adequate by my interpretation.    COORDINATION OF CARE: 11:33 PM-Discussed treatment plan which includes labs, meds, and imaging with pt at bedside and pt agreed to plan.   Labs Review Labs Reviewed  CBC WITH DIFFERENTIAL/PLATELET - Abnormal; Notable for the following:    RBC 2.97 (*)    Hemoglobin 9.9 (*)    HCT 31.4 (*)    MCV 105.7 (*)    RDW 19.1 (*)    Platelets 18 (*)    All other components within normal limits  COMPREHENSIVE METABOLIC PANEL - Abnormal; Notable for the following:    Glucose, Bld 107 (*)    BUN 27 (*)    Creatinine, Ser 4.55 (*)    Calcium 7.8 (*)    Total Protein 6.2 (*)    ALT 15 (*)    GFR calc non Af Amer 12 (*)    GFR calc Af Amer 14 (*)    All other components within normal limits  TROPONIN I - Abnormal; Notable for the following:    Troponin I 0.05 (*)    All other components within normal limits   CULTURE, BLOOD (ROUTINE X 2)  CULTURE, BLOOD (ROUTINE X 2)  LACTIC ACID, PLASMA   Baseline platelets for pt. Chronic thrombocytopenia  Imaging Review Dg Chest 2 View  09/25/2014   CLINICAL DATA:  Dry cough for several days. History of multiple myeloma.  EXAM: CHEST  2 VIEW  COMPARISON:  06/23/2014.  FINDINGS: LEFT IJ dialysis catheter is present. Cardiopericardial silhouette is enlarged for projection. Lower thoracic vertebral augmentation. Small bilateral pleural effusions are present which layer dependently, blunting costophrenic angles. No airspace disease is identified. No pneumothorax.  IMPRESSION: Cardiomegaly and small bilateral pleural effusions suggest volume overload.   Electronically Signed  By: Dereck Ligas M.D.   On: 09/25/2014 22:53  I personally reviewed the imaging tests through PACS system I reviewed available ER/hospitalization records through the EMR    EKG Interpretation None      MDM   Final diagnoses:  Bronchitis  Bronchospasm   Feels much better after albuterol. No orthopnea. Doubt CHF. Dialysis today. Will cover with abx for possible developing PNA given low grade fever. Pt understands to return to ER for new or worsening symptoms   I personally performed the services described in this documentation, which was scribed in my presence. The recorded information has been reviewed and is accurate.       Gregory Schmidt, MD 09/26/14 [phone]

## 2014-09-26 ENCOUNTER — Ambulatory Visit (HOSPITAL_COMMUNITY): Payer: Medicare Other | Admitting: Hematology & Oncology

## 2014-09-26 ENCOUNTER — Other Ambulatory Visit (HOSPITAL_COMMUNITY): Payer: Medicare Other

## 2014-09-26 DIAGNOSIS — J209 Acute bronchitis, unspecified: Secondary | ICD-10-CM | POA: Diagnosis not present

## 2014-09-26 LAB — CBC WITH DIFFERENTIAL/PLATELET
Basophils Absolute: 0 10*3/uL (ref 0.0–0.1)
Basophils Relative: 0 % (ref 0–1)
EOS PCT: 1 % (ref 0–5)
Eosinophils Absolute: 0 10*3/uL (ref 0.0–0.7)
HEMATOCRIT: 31.4 % — AB (ref 39.0–52.0)
HEMOGLOBIN: 9.9 g/dL — AB (ref 13.0–17.0)
LYMPHS ABS: 2.1 10*3/uL (ref 0.7–4.0)
LYMPHS PCT: 43 % (ref 12–46)
MCH: 33.3 pg (ref 26.0–34.0)
MCHC: 31.5 g/dL (ref 30.0–36.0)
MCV: 105.7 fL — AB (ref 78.0–100.0)
Monocytes Absolute: 0.4 10*3/uL (ref 0.1–1.0)
Monocytes Relative: 9 % (ref 3–12)
Neutro Abs: 2.3 10*3/uL (ref 1.7–7.7)
Neutrophils Relative %: 47 % (ref 43–77)
Platelets: 18 10*3/uL — CL (ref 150–400)
RBC: 2.97 MIL/uL — ABNORMAL LOW (ref 4.22–5.81)
RDW: 19.1 % — ABNORMAL HIGH (ref 11.5–15.5)
WBC: 4.8 10*3/uL (ref 4.0–10.5)

## 2014-09-26 LAB — COMPREHENSIVE METABOLIC PANEL
ALBUMIN: 3.5 g/dL (ref 3.5–5.0)
ALK PHOS: 46 U/L (ref 38–126)
ALT: 15 U/L — AB (ref 17–63)
AST: 17 U/L (ref 15–41)
Anion gap: 8 (ref 5–15)
BUN: 27 mg/dL — AB (ref 6–20)
CALCIUM: 7.8 mg/dL — AB (ref 8.9–10.3)
CO2: 28 mmol/L (ref 22–32)
CREATININE: 4.55 mg/dL — AB (ref 0.61–1.24)
Chloride: 102 mmol/L (ref 101–111)
GFR calc Af Amer: 14 mL/min — ABNORMAL LOW (ref >60)
GFR, EST NON AFRICAN AMERICAN: 12 mL/min — AB (ref >60)
Glucose, Bld: 107 mg/dL — ABNORMAL HIGH (ref 70–99)
Potassium: 4.6 mmol/L (ref 3.5–5.1)
Sodium: 138 mmol/L (ref 135–145)
Total Bilirubin: 0.8 mg/dL (ref 0.3–1.2)
Total Protein: 6.2 g/dL — ABNORMAL LOW (ref 6.5–8.1)

## 2014-09-26 LAB — TROPONIN I: Troponin I: 0.05 ng/mL — ABNORMAL HIGH (ref <0.031)

## 2014-09-26 LAB — LACTIC ACID, PLASMA: LACTIC ACID, VENOUS: 0.8 mmol/L (ref 0.5–2.0)

## 2014-09-26 MED ORDER — LEVOFLOXACIN 250 MG PO TABS: 250.0000 mg | ORAL_TABLET | Freq: Every day | ORAL | Status: DC

## 2014-09-26 MED ORDER — ALBUTEROL SULFATE HFA 108 (90 BASE) MCG/ACT IN AERS
2.0000 | INHALATION_SPRAY | Freq: Once | RESPIRATORY_TRACT | Status: AC
  Administered 2014-09-26: 2 via RESPIRATORY_TRACT
  Filled 2014-09-26: qty 6.7

## 2014-09-26 MED ORDER — LEVOFLOXACIN 500 MG PO TABS
500.0000 mg | ORAL_TABLET | Freq: Once | ORAL | Status: AC
  Administered 2014-09-26: 500 mg via ORAL
  Filled 2014-09-26: qty 1

## 2014-09-26 NOTE — Discharge Instructions (Signed)

## 2014-09-26 NOTE — ED Notes (Signed)
CRITICAL VALUE ALERT  Critical value received:  Platelets 18  Date of notification:  09/26/14  Time of notification:  9355  Critical value read back:Yes.    Nurse who received alert:  Joellyn Rued, RN  MD notified (1st page):  Dr Venora Maples  Time of first page:  0049  MD notified (2nd page):  Time of second page:  Responding MD:  Dr Venora Maples  Time MD responded:  [phone]

## 2014-09-27 ENCOUNTER — Other Ambulatory Visit (HOSPITAL_COMMUNITY): Payer: Self-pay | Admitting: Emergency Medicine

## 2014-09-27 ENCOUNTER — Encounter (HOSPITAL_COMMUNITY): Payer: Medicare Other

## 2014-09-27 ENCOUNTER — Other Ambulatory Visit (HOSPITAL_COMMUNITY): Payer: Self-pay | Admitting: Oncology

## 2014-09-27 DIAGNOSIS — C9000 Multiple myeloma not having achieved remission: Secondary | ICD-10-CM

## 2014-09-28 ENCOUNTER — Encounter (HOSPITAL_COMMUNITY): Payer: Self-pay

## 2014-09-28 ENCOUNTER — Encounter (HOSPITAL_BASED_OUTPATIENT_CLINIC_OR_DEPARTMENT_OTHER): Payer: Medicare Other

## 2014-09-28 ENCOUNTER — Encounter (HOSPITAL_BASED_OUTPATIENT_CLINIC_OR_DEPARTMENT_OTHER): Payer: Medicare Other | Admitting: Oncology

## 2014-09-28 VITALS — BP 129/60 | HR 58 | Temp 98.3°F | Resp 16

## 2014-09-28 DIAGNOSIS — R062 Wheezing: Secondary | ICD-10-CM

## 2014-09-28 DIAGNOSIS — J069 Acute upper respiratory infection, unspecified: Secondary | ICD-10-CM | POA: Diagnosis not present

## 2014-09-28 DIAGNOSIS — R5383 Other fatigue: Secondary | ICD-10-CM | POA: Diagnosis not present

## 2014-09-28 DIAGNOSIS — D696 Thrombocytopenia, unspecified: Secondary | ICD-10-CM | POA: Diagnosis not present

## 2014-09-28 DIAGNOSIS — C9000 Multiple myeloma not having achieved remission: Secondary | ICD-10-CM | POA: Diagnosis not present

## 2014-09-28 MED ORDER — ALBUTEROL SULFATE (2.5 MG/3ML) 0.083% IN NEBU: 2.5000 mg | INHALATION_SOLUTION | Freq: Four times a day (QID) | RESPIRATORY_TRACT | Status: DC | PRN

## 2014-09-28 MED ORDER — SODIUM CHLORIDE 0.9 % IV SOLN
250.0000 mL | Freq: Once | INTRAVENOUS | Status: AC
  Administered 2014-09-28: 250 mL via INTRAVENOUS

## 2014-09-28 MED ORDER — DIPHENHYDRAMINE HCL 25 MG PO CAPS
25.0000 mg | ORAL_CAPSULE | Freq: Once | ORAL | Status: AC
  Administered 2014-09-28: 25 mg via ORAL
  Filled 2014-09-28: qty 1

## 2014-09-28 MED ORDER — SODIUM CHLORIDE 0.9 % IJ SOLN: 10.0000 mL | INTRAMUSCULAR | Status: DC | PRN

## 2014-09-28 MED ORDER — ACETAMINOPHEN 325 MG PO TABS
650.0000 mg | ORAL_TABLET | Freq: Once | ORAL | Status: AC
  Administered 2014-09-28: 650 mg via ORAL
  Filled 2014-09-28: qty 2

## 2014-09-28 NOTE — Progress Notes (Signed)
Patient seen as a work-in.  He reported to the ED recently for URI issues.  He was given an antibiotic as an outpatient.  He reports to our clinic today for a platelet transfusion with a platelet count below 20,000.  He received a platelet transfusion today.  He requests a work-in appointment today to be seen.  He looks fatigued today and run down.  He does not look acutely ill.  He brings me up to date on his recent ED visit.  He reports that he started his Revlimid as planned and he took it on Friday. Saturday, and Sunday.  On Monday, he reported a pruritic rash on his chest.  He discontinued the medication and called the clinic.  We recommended holding the medication.  He reports that it nearly resolved by Tuesday.  "What do you want me to do about my chemo-pill?"  He reports in the ED, he was given a breathing treatment with excellent symptom improvement.  He was started on an antibiotic and he has taken 2 days worth of it thus far.  He is encouraged to complete this Rx.    "I want one of those mask things."  I have recommended an Albuterol inhaler, but "those things don't work for me.  I need one of those machines."  I wrote an Rx for Neubulizer and Albuterol syringes.  He can take every 8 hours PRN.  He does have a cough which keeps him up at night.  He may take 1/2 to 1 whole pain medication for its anti-tussive effects.   Given his infection, I have asked him to continue to hold Revlimid for the time being.  We can consider re-institution at 1 tablet every other day and increase as tolerated.  Patient and plan discussed with Dr. Ancil Linsey and she is in agreement with the aforementioned.   More than 50% of the time spent with the patient was utilized for counseling and coordination of care.  KEFALAS,THOMAS 09/28/2014 1:31 PM

## 2014-09-28 NOTE — Patient Instructions (Signed)
..  [place] at [facility] Discharge Instructions  RECOMMENDATIONS MADE BY THE CONSULTANT AND ANY TEST RESULTS WILL BE SENT TO YOUR REFERRING PHYSICIAN.  Exam today with Robynn Pane, PA-C. Prescriptions given for nebulizer solution  And face mask. Return for follow up as scheduled. Call for any new or worsening symptoms, questions, or concerns.   Thank you for choosing [place] at [facility] to provide your oncology and hematology care.  To afford each patient quality time with our provider, please arrive at least 15 minutes before your scheduled appointment time.    You need to re-schedule your appointment should you arrive 10 or more minutes late.  We strive to give you quality time with our providers, and arriving late affects you and other patients whose appointments are after yours.  Also, if you no show three or more times for appointments you may be dismissed from the clinic at the providers discretion.     Again, thank you for choosing [facility].  Our hope is that these requests will decrease the amount of time that you wait before being seen by our physicians.       _____________________________________________________________  Should you have questions after your visit to [facility], please contact our office at [phone] between the hours of 8:30 a.m. and 4:30 p.m.  Voicemails left after 4:30 p.m. will not be returned until the following business day.  For prescription refill requests, have your pharmacy contact our office.

## 2014-09-28 NOTE — Patient Instructions (Signed)
[place] at [facility]  Discharge Instructions:  Given platelets today Follow up as scheduled Please call the clinic if you have any questions or concerns _______________________________________________________________  Thank you for choosing [place] at [facility] to provide your oncology and hematology care.  To afford each patient quality time with our providers, please arrive at least 15 minutes before your scheduled appointment.  You need to re-schedule your appointment if you arrive 10 or more minutes late.  We strive to give you quality time with our providers, and arriving late affects you and other patients whose appointments are after yours.  Also, if you no show three or more times for appointments you may be dismissed from the clinic.  Again, thank you for choosing [place] at [place] hope is that these requests will allow you access to exceptional care and in a timely manner. _______________________________________________________________  If you have questions after your visit, please contact our office at [phone] between the hours of 8:30 a.m. and 5:00 p.m. Voicemails left after 4:30 p.m. will not be returned until the following business day. _______________________________________________________________  For prescription refill requests, have your pharmacy contact our office. _______________________________________________________________  Recommendations made by the consultant and any test results will be sent to your referring physician. _______________________________________________________________

## 2014-09-28 NOTE — Progress Notes (Signed)
Jac[place] Code Tolerated platelets today Discharged ambulatory  1130 pt c/o of face being flushed, no other complaints, VS WNL.  After a few minutes symptoms went away.

## 2014-09-29 LAB — PREPARE PLATELET PHERESIS: UNIT DIVISION: 0

## 2014-09-30 NOTE — Progress Notes (Signed)
Gregory Kilts, MD [place] [address]  DIAGNOSIS:  Multiple myeloma 01/13/2005 after presenting with groin pain, nausea, vomiting, loss of appetite and renal failure with hyperkalemia and anemia.  Bone Marrow - 10-15% atypical plasma cells  Urine protein: 5.79 gm/gm creat. UPEP 94% and UFIX free kappa light chains. Serum protein electrophoresis showed 0.41 gm/dl of kappa light chain. Beta II microglobulin (01/14/05): 12,587 mcg/ml (12.6). BUN/Creat. 103 / 6.5 . At the time of his diagnosis in 2006 he also underwent a cervical laminectomy of C-4, 5, 6, and 7 because of a mass causing cervical cord compression. He underwent fusion of C-4, 5, 6, 7 and T-1 with vertex rod instrumentation and morselized autograph fusion.  Chronic renal disease, stage 5, glomerular filtration rate less than or equal to 15 mL/min/1.73 square meter  Followed by Dr. Marcell Anger at [facility], s/p 2 autologous BM transplants.  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 Marcell Anger at [facility] restarted on September 05, 2012, 7 days on and 7 days off. Dexamethasone D/C'd due to patient reported intolerance. Pomalyst discontinued by patient secondary to "fatigue"   Anaphylactic reaction to bisphosphonate therapy, D/C'd  Thrombocytopenia, negative HIT panel Dialysis secondary to stage IV CKD BMBX 08/2014 at [facility] with Dr. Melburn Hake showing 5% plasma cells, revlimid 5 mg 21/28 days recommended.   CURRENT THERAPY: Dialysis   INTERVAL HISTORY: [name] 69 y.o. male returns for follow-up of his multiple myeloma. He had a bone marrow biopsy at [facility] with 5% plasma cells. It was recommended that he try Revlimid. He is unsure if he wishes to do that. He wants a blood transfusion weekly. He says that he is "dragging." He wishes there was something he could do to have a lot of energy. Again he expresses frustration with his chronic illnesses.  MEDICAL HISTORY: Past  Medical History  Diagnosis Date  . Hypertension   . Ruptured cervical disc   . Fall resulting in striking against other object     paralyzed  . Multiple myeloma   . 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 Multiple myeloma 01/26/2011  . Unspecified vitamin D deficiency 03/06/2013  . Anemia of chronic disease 12/30/2013  . Chronic renal disease, stage 5, glomerular filtration rate less than or equal to 15 mL/min/1.73 square meter 12/30/2013  . Constipation   . Dialysis patient 2016  . Thrombocytopenia 08/31/2014    has ESSENTIAL HYPERTENSION, BENIGN; BRADYCARDIA; Multiple myeloma; Intravenous pamidronate causing adverse effect in therapeutic use; Unspecified vitamin D deficiency; Lymphedema of lower extremity; Anemia of chronic disease; Chronic renal disease, stage 5, glomerular filtration rate less than or equal to 15 mL/min/1.73 square meter; Pathologic fracture; Anemia in chronic kidney disease; and Thrombocytopenia on his problem list.     is allergic to pamidronate.  [name] does not currently have medications on file.  SURGICAL HISTORY: Past Surgical History  Procedure Laterality Date  . Inguinal hernia repair Bilateral   . Anterior cervical decomp/discectomy fusion    . Bone marrow aspirate and biopsy wiith lumbar puncture    . Melanoma excision      rt. breast  . Cervical spine surgery    . Kyphoplasty Bilateral 01/30/2014    Procedure: Thoracic eleven Kyphoplasty;  Surgeon: Consuella Lose, MD;  Location: [facility];  Service: Neurosurgery;  Laterality: Bilateral;  Thoracic eleven Kyphoplasty  . Av fistula placement Right 04/30/2014    Procedure: BRACHIOCEPHALIC ARTERIOVENOUS (AV)  FISTULA CREATION;  Surgeon: Conrad Sand Fork, MD;  Location: [place];  Service: Vascular;  Laterality: Right;    SOCIAL HISTORY: History   Social History  . Marital Status: Divorced    Spouse Name: N/A  . Number of Children: N/A  . Years of Education: N/A    Occupational History  . Not on file.   Social History Main Topics  . Smoking status: Never Smoker   . Smokeless tobacco: Never Used  . Alcohol Use: No  . Drug Use: No  . Sexual Activity: Not on file   Other Topics Concern  . Not on file   Social History Narrative    FAMILY HISTORY: Family History  Problem Relation Age of Onset  . Cancer Sister   . Cancer Brother     Review of Systems  Constitutional: Positive for malaise/fatigue.  HENT: Negative.   Eyes: Negative.   Respiratory: Positive for shortness of breath.   Cardiovascular: Negative.   Gastrointestinal: Negative.   Genitourinary: Negative.   Musculoskeletal: Positive for myalgias and joint pain.  Skin: Negative.   Neurological: Positive for weakness. Negative for dizziness, tingling, tremors, sensory change, speech change, focal weakness, seizures and loss of consciousness.  Endo/Heme/Allergies: Negative.   Psychiatric/Behavioral: The patient has insomnia.     PHYSICAL EXAMINATION  ECOG PERFORMANCE STATUS: 1 - Symptomatic but completely ambulatory  Filed Vitals:   09/05/14 0943  BP: 142/72  Pulse: 64  Resp: 18    Physical Exam  Constitutional: He is oriented to person, place, and time and well-developed, well-nourished, and in no distress.  HENT:  Head: Normocephalic and atraumatic.  Nose: Nose normal.  Mouth/Throat: Oropharynx is clear and moist. No oropharyngeal exudate.  Eyes: Conjunctivae and EOM are normal. Pupils are equal, round, and reactive to light. Right eye exhibits no discharge. Left eye exhibits no discharge. No scleral icterus.  Neck: Normal range of motion. Neck supple. No tracheal deviation present. No thyromegaly present.  Cardiovascular: Normal rate, regular rhythm and normal heart sounds.  Exam reveals no gallop and no friction rub.   No murmur heard. Pulmonary/Chest: Effort normal and breath sounds normal. He has no wheezes. He has no rales.  Abdominal: Soft. Bowel sounds are  normal. He exhibits no distension and no mass. There is no tenderness. There is no rebound and no guarding.  Musculoskeletal: Normal range of motion. He exhibits no edema.  Lymphadenopathy:    He has no cervical adenopathy.  Neurological: He is alert and oriented to person, place, and time. He has normal reflexes. No cranial nerve deficit. Gait normal. Coordination normal.  Skin: Skin is warm and dry. No rash noted.  Psychiatric: Mood, memory, affect and judgment normal.  Nursing note and vitals reviewed.   LABORATORY DATA:  Results for CONLIN, BRAHM (MRN [id number]) as of 09/30/2014 20:14  Ref. Range 09/03/2014 13:50  WBC Latest Ref Range: 4.0-10.5 K/uL 3.1 (L)  RBC Latest Ref Range: 4.22-5.81 MIL/uL 1.95 (L)  Hemoglobin Latest Ref Range: 13.0-17.0 g/dL 7.2 (L)  HCT Latest Ref Range: 39.0-52.0 % 22.0 (L)  MCV Latest Ref Range: 78.0-100.0 fL 112.8 (H)  MCH Latest Ref Range: 26.0-34.0 pg 36.9 (H)  MCHC Latest Ref Range: 30.0-36.0 g/dL 32.7  RDW Latest Ref Range: 11.5-15.5 % 17.4 (H)  Platelets Latest Ref Range: 150-400 K/uL 32 (L)    Bone Marrow - Other-specify source in comments   Result Narrative  ACCESSION NUMBER: [id number] RECEIVED: 08/27/2014 ORDERING PHYSICIAN: CESAR Melburn Hake , MD PATIENT NAME: [name]  CECIL BONE MARROW REPORT   Bone Marrow (BM) and Peripheral Blood (PB) FINAL PATHOLOGIC DIAGNOSIS  BONE MARROW:      Normocellular bone marrow (40%) with no increase in plasma cells      See comment  PERIPHERAL BLOOD:      Macrocytic anemia      Thrombocytopenia      See CBC data and comment    The bone marrow clot sections reveal a normocellular marrow for age (50%) with erythroid-predominant trilineage hematopoiesis. Megakaryocytes appear mildly decreased in number without clustering. There are no lymphoid aggregates identified. CD38 immunohistochemistry reveals scattered plasma cells and rare loose collections of plasma cells with no overall  increase in number (<5%). By light chain in situ hybridization, plasma cells are polytypic. The bone marrow core biopsy demonstrates bone, fibrous tissue, and scant subcortical hypocellular marrow.  The peripheral blood smear reveals macrocytic anemia with mild anisopoikilocytosis and thrombocytopenia. Leukocytes are morphologically unremarkable.  Overall, there is no morphologic or immunohistochemical evidence of plasma cell neoplasm. Correlation with cytogenetic findings is recommended.  The positive immunohistochemical and positive and negative in situ hybridization controls worked appropriately.  "These tests were developed and their performance characteristics determined by [facility]. They have not been cleared or approved by the U.S. Food and Drug Administration. The FDA has determined that such clearance or approval is not necessary. These tests are used for clinical purposes. They should not be regarded as investigational or for research. This laboratory is certified under the [place] (CLIA) as qualified to perform high complexity clinical laboratory testing."   IMPRESSION AND PLAN: Kappa light chain multiple myeloma  S/P Autologous stem Cell Transplant (melphalan 200) 10/16/2005 with thalidomide maintenance Disease progression 5+ years later  S/P second autologous stem cell transplant (melphalan 140) 7/102013 with pomalidomide maintenance  Stage IV CKD on dialysis Pancytopenia  I reviewed with the patient the recommendations of his physician at [facility]. I encouraged him to consider trying Revlimid as recommended. We again addressed the potential causes of his pancytopenia and I advised him that his anemia was most likely secondary to his chronic kidney disease and his platelet count may be secondary to an immune mediated component. His BMBX results were reviewed and are  listed above, he had a normocellular marrow.  He has opted to try Revlimid. We will therefore write him a prescription. We will keep a close watch on his counts moving forward. I would like to obtain records from dialysis to see how often his blood work is checked. We can also look at his Procrit dosing. We will need to monitor his platelets moving forward closely. At some point we could consider a trial of steroids if necessary. We will plan on seeing [place] back once he starts his Revlimid.   We reviewed side effects, risks and benefits of therapy. He has a good understanding and wishes to proceed.   All questions were answered. The patient knows to call the clinic with any problems, questions or concerns. We can certainly see the patient much sooner if necessary. This note was electronically signed.  Molli Hazard MD 09/30/2014

## 2014-10-01 LAB — CULTURE, BLOOD (ROUTINE X 2)
CULTURE: NO GROWTH
CULTURE: NO GROWTH

## 2014-10-03 ENCOUNTER — Emergency Department (HOSPITAL_COMMUNITY)
Admission: EM | Admit: 2014-10-03 | Discharge: 2014-10-03 | Disposition: A | Discharge status: Home / Self Care | Payer: Medicare Other | Attending: Emergency Medicine | Admitting: Emergency Medicine

## 2014-10-03 ENCOUNTER — Encounter (HOSPITAL_COMMUNITY): Payer: Self-pay | Admitting: *Deleted

## 2014-10-03 DIAGNOSIS — Z79899 Other long term (current) drug therapy: Secondary | ICD-10-CM | POA: Insufficient documentation

## 2014-10-03 DIAGNOSIS — J9801 Acute bronchospasm: Secondary | ICD-10-CM

## 2014-10-03 DIAGNOSIS — Z992 Dependence on renal dialysis: Secondary | ICD-10-CM | POA: Diagnosis not present

## 2014-10-03 DIAGNOSIS — Z792 Long term (current) use of antibiotics: Secondary | ICD-10-CM | POA: Insufficient documentation

## 2014-10-03 DIAGNOSIS — Z8579 Personal history of other malignant neoplasms of lymphoid, hematopoietic and related tissues: Secondary | ICD-10-CM | POA: Insufficient documentation

## 2014-10-03 DIAGNOSIS — I120 Hypertensive chronic kidney disease with stage 5 chronic kidney disease or end stage renal disease: Secondary | ICD-10-CM | POA: Insufficient documentation

## 2014-10-03 DIAGNOSIS — Z7952 Long term (current) use of systemic steroids: Secondary | ICD-10-CM | POA: Insufficient documentation

## 2014-10-03 DIAGNOSIS — Z9181 History of falling: Secondary | ICD-10-CM | POA: Diagnosis not present

## 2014-10-03 DIAGNOSIS — Z8639 Personal history of other endocrine, nutritional and metabolic disease: Secondary | ICD-10-CM | POA: Insufficient documentation

## 2014-10-03 DIAGNOSIS — Z862 Personal history of diseases of the blood and blood-forming organs and certain disorders involving the immune mechanism: Secondary | ICD-10-CM | POA: Diagnosis not present

## 2014-10-03 DIAGNOSIS — N185 Chronic kidney disease, stage 5: Secondary | ICD-10-CM | POA: Insufficient documentation

## 2014-10-03 DIAGNOSIS — Z8719 Personal history of other diseases of the digestive system: Secondary | ICD-10-CM | POA: Insufficient documentation

## 2014-10-03 DIAGNOSIS — R05 Cough: Secondary | ICD-10-CM | POA: Diagnosis present

## 2014-10-03 MED ORDER — METHYLPREDNISOLONE SODIUM SUCC 125 MG IJ SOLR
125.0000 mg | Freq: Once | INTRAMUSCULAR | Status: AC
  Administered 2014-10-03: 125 mg via INTRAMUSCULAR
  Filled 2014-10-03: qty 2

## 2014-10-03 MED ORDER — HYDROCOD POLST-CPM POLST ER 10-8 MG/5ML PO SUER: 5.0000 mL | Freq: Two times a day (BID) | ORAL | Status: DC | PRN

## 2014-10-03 NOTE — ED Notes (Signed)
MD at bedside. 

## 2014-10-03 NOTE — Discharge Instructions (Signed)
Follow up if not improving

## 2014-10-03 NOTE — ED Provider Notes (Signed)
CSN: [id number]     Arrival date & time 10/03/14  1 History   First MD Initiated Contact with Patient 10/03/14 1620     Chief Complaint  Patient presents with  . Cough     (Consider location/radiation/quality/duration/timing/severity/associated sxs/prior Treatment) Patient is a 69 y.o. male presenting with cough. The history is provided by the patient (the pt is on antibioitcs and neb tx for cough pt wants steroid shots).  Cough Cough characteristics:  Non-productive Severity:  Moderate Onset quality:  Gradual Timing:  Constant Progression:  Waxing and waning Chronicity:  New Context: not animal exposure   Relieved by:  Nothing Associated symptoms: no chest pain, no eye discharge, no headaches and no rash     Past Medical History  Diagnosis Date  . Hypertension   . Ruptured cervical disc   . Fall resulting in striking against other object     paralyzed  . Multiple myeloma   . 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 Multiple myeloma 01/26/2011  . Unspecified vitamin D deficiency 03/06/2013  . Anemia of chronic disease 12/30/2013  . Chronic renal disease, stage 5, glomerular filtration rate less than or equal to 15 mL/min/1.73 square meter 12/30/2013  . Constipation   . Dialysis patient 2016  . Thrombocytopenia 08/31/2014   Past Surgical History  Procedure Laterality Date  . Inguinal hernia repair Bilateral   . Anterior cervical decomp/discectomy fusion    . Bone marrow aspirate and biopsy wiith lumbar puncture    . Melanoma excision      rt. breast  . Cervical spine surgery    . Kyphoplasty Bilateral 01/30/2014    Procedure: Thoracic eleven Kyphoplasty;  Surgeon: Consuella Lose, MD;  Location: [facility];  Service: Neurosurgery;  Laterality: Bilateral;  Thoracic eleven Kyphoplasty  . Av fistula placement Right 04/30/2014    Procedure: BRACHIOCEPHALIC ARTERIOVENOUS (AV) FISTULA CREATION;  Surgeon: Conrad Keystone, MD;  Location: [place];   Service: Vascular;  Laterality: Right;   Family History  Problem Relation Age of Onset  . Cancer Sister   . Cancer Brother    History  Substance Use Topics  . Smoking status: Never Smoker   . Smokeless tobacco: Never Used  . Alcohol Use: No    Review of Systems  Constitutional: Negative for appetite change and fatigue.  HENT: Negative for congestion, ear discharge and sinus pressure.   Eyes: Negative for discharge.  Respiratory: Positive for cough.   Cardiovascular: Negative for chest pain.  Gastrointestinal: Negative for abdominal pain and diarrhea.  Genitourinary: Negative for frequency and hematuria.  Musculoskeletal: Negative for back pain.  Skin: Negative for rash.  Neurological: Negative for seizures and headaches.  Psychiatric/Behavioral: Negative for hallucinations.      Allergies  Pamidronate  Home Medications   Prior to Admission medications   Medication Sig Start Date End Date Taking? Authorizing Provider  docusate sodium (COLACE) 100 MG capsule Take 100 mg by mouth daily.    Yes Historical Provider, MD  furosemide (LASIX) 20 MG tablet Take 40 mg by mouth daily.   Yes Historical Provider, MD  metoprolol succinate (TOPROL XL) 25 MG 24 hr tablet Take 25 mg by mouth daily.  12/18/13  Yes Historical Provider, MD  Omega-3 Fatty Acids (FISH OIL) 1000 MG CAPS Take 2 capsules by mouth daily.   Yes Historical Provider, MD  pregabalin (LYRICA) 75 MG capsule Take 1 capsule (75 mg total) by mouth 2 (two) times daily. 09/21/14  Yes Manon Hilding Kefalas, PA-C  albuterol (PROVENTIL) (2.5 MG/3ML) 0.083% nebulizer solution Take 3 mLs (2.5 mg total) by nebulization every 6 (six) hours as needed for wheezing or shortness of breath. 09/28/14   Baird Cancer, PA-C  chlorpheniramine-HYDROcodone (TUSSIONEX PENNKINETIC ER) 10-8 MG/5ML SUER Take 5 mLs by mouth every 12 (twelve) hours as needed for cough. 10/03/14   Milton Ferguson, MD  doxycycline (VIBRAMYCIN) 100 MG capsule Take 1 capsule (100  mg total) by mouth 2 (two) times daily. Patient not taking: Reported on 09/21/2014 09/08/14   Nat Christen, MD  hydrocortisone cream 0.5 % Apply 1 application topically 2 (two) times daily. Patient not taking: Reported on 09/21/2014 09/14/14   Baird Cancer, PA-C  lenalidomide (REVLIMID) 5 MG capsule Take one tablet once daily, take after dialysis Patient not taking: Reported on 09/21/2014 09/05/14   [name], MD  levofloxacin (LEVAQUIN) 250 MG tablet Take 1 tablet (250 mg total) by mouth daily. Patient not taking: Reported on 10/03/2014 09/26/14   [name], MD  oxyCODONE-acetaminophen (ROXICET) 5-325 MG per tablet Take 1 tablet by mouth every 6 (six) hours as needed for severe pain. Patient not taking: Reported on 08/31/2014 04/30/14   Joelene Millin A Trinh, PA-C   BP 149/79 mmHg  Pulse 66  Temp(Src) 98.2 F (36.8 C) (Oral)  Resp 20  Ht _0  (1.803 m)  Wt 242 lb (109.77 kg)  BMI 33.77 kg/m2  SpO2 100% Physical Exam  Constitutional: He is oriented to person, place, and time. He appears well-developed.  HENT:  Head: Normocephalic.  Eyes: Conjunctivae and EOM are normal. No scleral icterus.  Neck: Neck supple. No thyromegaly present.  Cardiovascular: Normal rate and regular rhythm.  Exam reveals no gallop and no friction rub.   No murmur heard. Pulmonary/Chest: No stridor. He has wheezes. He has no rales. He exhibits no tenderness.  Abdominal: He exhibits no distension. There is no tenderness. There is no rebound.  Musculoskeletal: Normal range of motion. He exhibits no edema.  Lymphadenopathy:    He has no cervical adenopathy.  Neurological: He is oriented to person, place, and time. He exhibits normal muscle tone. Coordination normal.  Skin: No rash noted. No erythema.  Psychiatric: He has a normal mood and affect. His behavior is normal.    ED Course  Procedures (including critical care time) Labs Review Labs Reviewed - No data to display  Imaging Review No results  found.   EKG Interpretation None      MDM   Final diagnoses:  Bronchospasm    Bronchitis and bronchospasm.  rx with steroid im,  Will continue antibiotic and neb tx    Milton Ferguson, MD 10/03/14 1719

## 2014-10-03 NOTE — ED Notes (Signed)
1 week hx of dry cough.  Was seen last week in ER for same. States was given neb treatment.  Has inhaler and nebulizer at home.  It is helping some, but not "a whole lot". States he feels he "needs a steroid shot."

## 2014-10-05 ENCOUNTER — Encounter (HOSPITAL_BASED_OUTPATIENT_CLINIC_OR_DEPARTMENT_OTHER): Payer: Medicare Other | Admitting: Oncology

## 2014-10-05 ENCOUNTER — Other Ambulatory Visit (HOSPITAL_COMMUNITY): Payer: Medicare Other

## 2014-10-05 ENCOUNTER — Ambulatory Visit (HOSPITAL_COMMUNITY): Payer: Medicare Other | Admitting: Hematology & Oncology

## 2014-10-05 ENCOUNTER — Encounter (HOSPITAL_COMMUNITY): Payer: Self-pay | Admitting: Oncology

## 2014-10-05 VITALS — BP 115/61 | HR 57 | Temp 98.5°F | Resp 18 | Wt 241.7 lb

## 2014-10-05 DIAGNOSIS — C9000 Multiple myeloma not having achieved remission: Secondary | ICD-10-CM | POA: Diagnosis not present

## 2014-10-05 DIAGNOSIS — J40 Bronchitis, not specified as acute or chronic: Secondary | ICD-10-CM

## 2014-10-05 DIAGNOSIS — R062 Wheezing: Secondary | ICD-10-CM

## 2014-10-05 MED ORDER — LENALIDOMIDE 5 MG PO CAPS: ORAL_CAPSULE | ORAL | Status: DC

## 2014-10-05 MED ORDER — PREDNISONE 10 MG PO TABS: 20.0000 mg | ORAL_TABLET | Freq: Every day | ORAL | Status: AC

## 2014-10-05 MED ORDER — LENALIDOMIDE 5 MG PO CAPS
5.0000 mg | ORAL_CAPSULE | ORAL | Status: DC
Start: 2014-10-05 — End: 2014-10-05

## 2014-10-05 NOTE — Progress Notes (Signed)
Gregory Kilts, MD [place] [address]  Multiple myeloma  CURRENT THERAPY: Revlimid on hold due to drug rash and URI.  INTERVAL HISTORY: [name] 69 y.o. male returns for followup of multiple myeloma, S/P 2 autologous BM transplant at [facility] under the guidance of Dr. Marcell Anger (retired) with the second bone marrow transplant occuring in July 2013. Recent bone marrow demonstrates 5% plasma cells and he was seen by Dr. Norma Fredrickson who recommended starting Revlimid maintenance at a dose of 5 mg 21/28 days.      Multiple myeloma   01/26/2011 Initial Diagnosis Multiple myeloma   09/10/2014 - 09/14/2014 Chemotherapy Revlimid 5 mg days 1-21 every 28 days   09/14/2014 Adverse Reaction Rash, Revlimid versus Doxycycline   09/21/2014 - 09/23/2014 Chemotherapy Revlimid 5 mg days 1-21 every 28 days- rechallenge.   09/24/2014 Adverse Reaction Rash.  Revlimid-induced   I personally reviewed and went over laboratory results with the patient.  The results are noted within this dictation.  See work-in note from last week for details regarding Revlimid-induced rash.  Cinch reported to the ED on 10/03/2014.  He was diagnosed with bronchitis and bronchospasm.  He was given steroid injection IM, and discharged from the ED with antibiotics and nebulizer treatments.    Today, he still has a cough and has wheezing on exam.  I will give him 20 mg of Prednisone daily x 4 days.  He admits that he declined outpatient corticosteroid that was offered by ED.  He reports that he went to the ED after his primary care provider refused to see him because he was released from their practice.  Therefore, he needs a new primary care provider.  Delphina Cahill is accepting new patients.  I have asked the patient to contact his office to establish himself as a new patient.  He inquires about starting his Revlimid on Monday.  A new Rx is provided today with dosing change as outlined  below.  Hematologically, he denies any complaints and ROS questioning is negative.  Past Medical History  Diagnosis Date  . Hypertension   . Ruptured cervical disc   . Fall resulting in striking against other object     paralyzed  . Multiple myeloma   . 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 Multiple myeloma 01/26/2011  . Unspecified vitamin D deficiency 03/06/2013  . Anemia of chronic disease 12/30/2013  . Chronic renal disease, stage 5, glomerular filtration rate less than or equal to 15 mL/min/1.73 square meter 12/30/2013  . Constipation   . Dialysis patient 2016  . Thrombocytopenia 08/31/2014    has ESSENTIAL HYPERTENSION, BENIGN; BRADYCARDIA; Multiple myeloma; Intravenous pamidronate causing adverse effect in therapeutic use; Unspecified vitamin D deficiency; Lymphedema of lower extremity; Anemia of chronic disease; Chronic renal disease, stage 5, glomerular filtration rate less than or equal to 15 mL/min/1.73 square meter; Pathologic fracture; Anemia in chronic kidney disease; and Thrombocytopenia on his problem list.     is allergic to pamidronate.  Mr. Wintle had no medications administered during this visit.  Past Surgical History  Procedure Laterality Date  . Inguinal hernia repair Bilateral   . Anterior cervical decomp/discectomy fusion    . Bone marrow aspirate and biopsy wiith lumbar puncture    . Melanoma excision      rt. breast  . Cervical spine surgery    . Kyphoplasty Bilateral 01/30/2014    Procedure: Thoracic eleven Kyphoplasty;  Surgeon: Consuella Lose, MD;  Location: [place] [facility];  Service: Neurosurgery;  Laterality: Bilateral;  Thoracic eleven Kyphoplasty  . Av fistula placement Right 04/30/2014    Procedure: BRACHIOCEPHALIC ARTERIOVENOUS (AV) FISTULA CREATION;  Surgeon: Conrad Unadilla, MD;  Location: [place];  Service: Vascular;  Laterality: Right;    Denies any headaches, dizziness, double vision, fevers, chills, night  sweats, nausea, vomiting, diarrhea, constipation, chest pain, heart palpitations, shortness of breath, blood in stool, black tarry stool, urinary pain, urinary burning, urinary frequency, hematuria.   PHYSICAL EXAMINATION  ECOG PERFORMANCE STATUS: 0 - Asymptomatic  Filed Vitals:   10/05/14 1431  BP: 115/61  Pulse: 57  Temp: 98.5 F (36.9 C)  Resp: 18    GENERAL:alert, no distress, well nourished, well developed, comfortable, cooperative, obese and smiling SKIN: skin color, texture, turgor are normal, no rashes or significant lesions HEAD: Normocephalic, No masses, lesions, tenderness or abnormalities EYES: normal, PERRLA, EOMI, Conjunctiva are pink and non-injected EARS: External ears normal OROPHARYNX:lips, buccal mucosa, and tongue normal and mucous membranes are moist  NECK: supple, no adenopathy, thyroid normal size, non-tender, without nodularity, trachea midline LYMPH:  not examined BREAST:not examined LUNGS: expiratory wheezes bilaterally. HEART: RRR ABDOMEN:not examined BACK: Back symmetric, no curvature. EXTREMITIES:less then 2 second capillary refill, no skin discoloration, no cyanosis  NEURO: alert & oriented x 3 with fluent speech, no focal motor/sensory deficits, gait normal   LABORATORY DATA: CBC    Component Value Date/Time   WBC 4.8 09/25/2014 2332   RBC 2.97* 09/25/2014 2332   HGB 9.9* 09/25/2014 2332   HCT 31.4* 09/25/2014 2332   PLT 18* 09/25/2014 2332   MCV 105.7* 09/25/2014 2332   MCH 33.3 09/25/2014 2332   MCHC 31.5 09/25/2014 2332   RDW 19.1* 09/25/2014 2332   LYMPHSABS 2.1 09/25/2014 2332   MONOABS 0.4 09/25/2014 2332   EOSABS 0.0 09/25/2014 2332   BASOSABS 0.0 09/25/2014 2332      Chemistry      Component Value Date/Time   NA 138 09/25/2014 2332   K 4.6 09/25/2014 2332   CL 102 09/25/2014 2332   CO2 28 09/25/2014 2332   BUN 27* 09/25/2014 2332   CREATININE 4.55* 09/25/2014 2332   CREATININE 43.09* 01/26/2012 1052      Component  Value Date/Time   CALCIUM 7.8* 09/25/2014 2332   ALKPHOS 46 09/25/2014 2332   AST 17 09/25/2014 2332   ALT 15* 09/25/2014 2332   BILITOT 0.8 09/25/2014 2332       ASSESSMENT AND PLAN:  Multiple myeloma multiple myeloma, S/P 2 autologous BM transplant at [facility] under the guidance of Dr. Marcell Anger (retired) with the second bone marrow transplant occuring in July 2013. Recent bone marrow demonstrates 5% plasma cells and he was seen by Dr. Norma Fredrickson who recommended starting Revlimid maintenance at a dose of 5 mg 21/28 days beginning on 09/10/2014.  Treatment thus far has been complicated by Revlimid-induced drug rash and bronchitis. 10/03/2014 ED visit noted.     I will continue to hold Revlimid until his bronchitis is improved/resolved given the patient's disposition.  When improved, an every-other-day re-challenge of Revlimid will be considered.    Will provide blood products to maintain platelet count > 20,000 and Hgb > 8.0 g/dL.    Labs every 4 weeks: CBC diff, CMET, LDH, ESR, CRP, MM panel, B2M  Return as scheduled on 10/22/2014 for follow-up.    THERAPY PLAN:  Continue to hold Revlimid for the time being.  All questions were answered.  The patient knows to call the clinic with any problems, questions or concerns. We can certainly see the patient much sooner if necessary.  Patient and plan discussed with Dr. Ancil Linsey and she is in agreement with the aforementioned.   This note is electronically signed by: Robynn Pane 10/05/2014 3:48 PM

## 2014-10-05 NOTE — Addendum Note (Signed)
Addended by: Baird Cancer on: 10/05/2014 03:54 PM   Modules accepted: Orders

## 2014-10-05 NOTE — Patient Instructions (Signed)
..  [place] at [facility] Discharge Instructions  RECOMMENDATIONS MADE BY THE CONSULTANT AND ANY TEST RESULTS WILL BE SENT TO YOUR REFERRING PHYSICIAN.  Exam per T. Sheldon Silvan PA-C Lab orders for dialysis sent with you Prednisone 20 mg daily for 4 days Revlimid to begin om Monday and take every other day Return as scheduled 10/22/2014  Thank you for choosing [place] at [facility] to provide your oncology and hematology care.  To afford each patient quality time with our provider, please arrive at least 15 minutes before your scheduled appointment time.    You need to re-schedule your appointment should you arrive 10 or more minutes late.  We strive to give you quality time with our providers, and arriving late affects you and other patients whose appointments are after yours.  Also, if you no show three or more times for appointments you may be dismissed from the clinic at the providers discretion.     Again, thank you for choosing [facility].  Our hope is that these requests will decrease the amount of time that you wait before being seen by our physicians.       _____________________________________________________________  Should you have questions after your visit to [facility], please contact our office at [phone] between the hours of 8:30 a.m. and 4:30 p.m.  Voicemails left after 4:30 p.m. will not be returned until the following business day.  For prescription refill requests, have your pharmacy contact our office.

## 2014-10-05 NOTE — Assessment & Plan Note (Addendum)
multiple myeloma, S/P 2 autologous BM transplant at [facility] under the guidance of Dr. Hurd (retired) with the second bone marrow transplant occuring in July 2013. Recent bone marrow demonstrates 5% plasma cells and he was seen by Dr. Rodriguez who recommended starting Revlimid maintenance at a dose of 5 mg 21/28 days beginning on 09/10/2014.  Treatment thus far has been complicated by Revlimid-induced drug rash and bronchitis. 10/03/2014 ED visit noted.     Will provide blood products to maintain platelet count > 20,000 and Hgb > 8.0 g/dL.    Order provided for a CBC diff, CMET during dialysis so we can monitor his blood counts.  Labs every 4 weeks: CBC diff, CMET, LDH, ESR, CRP, MM panel, B2M  He will restart Revlimid every other day and a new Rx is printed for [facility].  He wants to start on Monday, 5/23.  Prednisone 20 mg daily x 4 days escribed to [facility].  I have given [place] information for Dr. Zach Hall's office to establish PCP care.  Return as scheduled on 10/22/2014 for follow-up. 

## 2014-10-10 ENCOUNTER — Telehealth (HOSPITAL_COMMUNITY): Payer: Self-pay | Admitting: Oncology

## 2014-10-10 NOTE — Telephone Encounter (Signed)
I called patient.  Left VM  KEFALAS,THOMAS 10/10/2014 8:38 AM

## 2014-10-11 ENCOUNTER — Telehealth (HOSPITAL_COMMUNITY): Payer: Self-pay | Admitting: Oncology

## 2014-10-11 NOTE — Telephone Encounter (Signed)
No answer.  VM left.  Chadwick Reiswig 10/11/2014 6:47 PM

## 2014-10-12 ENCOUNTER — Telehealth (HOSPITAL_COMMUNITY): Payer: Self-pay | Admitting: Oncology

## 2014-10-12 DIAGNOSIS — C9000 Multiple myeloma not having achieved remission: Secondary | ICD-10-CM

## 2014-10-12 NOTE — Telephone Encounter (Signed)
Patient reports that his bood work could not be checked at Dialysis because he was told that "they don't have to tubes for blood counts."  He said I was told to "go to my cancer doctors and get the tubes I need for the blood; they would draw it and then he would need to trasnport it back to the cancer doctors for you to test."    Of course this is a logistical nightmare and not the right thing for the patient.  Therefore, we will check labs next Wednesday: CBC.  [name] 10/12/2014 5:21 PM

## 2014-10-17 ENCOUNTER — Other Ambulatory Visit (HOSPITAL_COMMUNITY): Payer: Medicare Other

## 2014-10-19 ENCOUNTER — Encounter (HOSPITAL_COMMUNITY): Payer: Medicare Other | Attending: Oncology

## 2014-10-19 ENCOUNTER — Telehealth (HOSPITAL_COMMUNITY): Payer: Self-pay | Admitting: Emergency Medicine

## 2014-10-19 DIAGNOSIS — C9000 Multiple myeloma not having achieved remission: Secondary | ICD-10-CM | POA: Diagnosis present

## 2014-10-19 LAB — CBC
HCT: 30.8 % — ABNORMAL LOW (ref 39.0–52.0)
Hemoglobin: 9.8 g/dL — ABNORMAL LOW (ref 13.0–17.0)
MCH: 34 pg (ref 26.0–34.0)
MCHC: 31.8 g/dL (ref 30.0–36.0)
MCV: 106.9 fL — ABNORMAL HIGH (ref 78.0–100.0)
PLATELETS: 17 10*3/uL — AB (ref 150–400)
RBC: 2.88 MIL/uL — ABNORMAL LOW (ref 4.22–5.81)
RDW: 19.3 % — ABNORMAL HIGH (ref 11.5–15.5)
WBC: 4.3 10*3/uL (ref 4.0–10.5)

## 2014-10-19 NOTE — Progress Notes (Unsigned)
Labs drawn

## 2014-10-19 NOTE — Telephone Encounter (Signed)
-----   Message from Baird Cancer, PA-C sent at 10/19/2014 11:45 AM EDT ----- Set up for platelet transfusion.  Report to ED with any bleeding.

## 2014-10-19 NOTE — Progress Notes (Unsigned)
CRITICAL VALUE ALERT Critical value received:  Platelets Date of notification:  10/19/2014 Time of notification: 0955 Critical value read back:  Yes.   Nurse who received alert:  Madaline Brilliant MD notified (1st page):  Kirby Crigler p-ac

## 2014-10-19 NOTE — Telephone Encounter (Signed)
Called to set up transfusion for Monday, blood bank notified, platelet orders in.

## 2014-10-22 ENCOUNTER — Encounter (HOSPITAL_BASED_OUTPATIENT_CLINIC_OR_DEPARTMENT_OTHER): Payer: Medicare Other | Admitting: Oncology

## 2014-10-22 ENCOUNTER — Ambulatory Visit (HOSPITAL_COMMUNITY): Payer: Medicare Other | Admitting: Hematology & Oncology

## 2014-10-22 ENCOUNTER — Encounter (HOSPITAL_BASED_OUTPATIENT_CLINIC_OR_DEPARTMENT_OTHER): Payer: Medicare Other

## 2014-10-22 VITALS — BP 125/71 | HR 46 | Temp 97.8°F | Resp 18

## 2014-10-22 DIAGNOSIS — R21 Rash and other nonspecific skin eruption: Secondary | ICD-10-CM

## 2014-10-22 DIAGNOSIS — D696 Thrombocytopenia, unspecified: Secondary | ICD-10-CM

## 2014-10-22 DIAGNOSIS — C9000 Multiple myeloma not having achieved remission: Secondary | ICD-10-CM

## 2014-10-22 LAB — C-REACTIVE PROTEIN: CRP: 1 mg/dL — ABNORMAL HIGH (ref <1.0)

## 2014-10-22 LAB — CBC WITH DIFFERENTIAL/PLATELET
BASOS ABS: 0 10*3/uL (ref 0.0–0.1)
Basophils Relative: 0 % (ref 0–1)
EOS PCT: 7 % — AB (ref 0–5)
Eosinophils Absolute: 0.2 10*3/uL (ref 0.0–0.7)
HEMATOCRIT: 28.5 % — AB (ref 39.0–52.0)
Hemoglobin: 8.9 g/dL — ABNORMAL LOW (ref 13.0–17.0)
LYMPHS PCT: 45 % (ref 12–46)
Lymphs Abs: 1.4 10*3/uL (ref 0.7–4.0)
MCH: 33.5 pg (ref 26.0–34.0)
MCHC: 31.2 g/dL (ref 30.0–36.0)
MCV: 107.1 fL — ABNORMAL HIGH (ref 78.0–100.0)
MONOS PCT: 8 % (ref 3–12)
Monocytes Absolute: 0.3 10*3/uL (ref 0.1–1.0)
Neutro Abs: 1.3 10*3/uL — ABNORMAL LOW (ref 1.7–7.7)
Neutrophils Relative %: 41 % — ABNORMAL LOW (ref 43–77)
Platelets: 54 10*3/uL — ABNORMAL LOW (ref 150–400)
RBC: 2.66 MIL/uL — ABNORMAL LOW (ref 4.22–5.81)
RDW: 19.3 % — ABNORMAL HIGH (ref 11.5–15.5)
WBC: 3.2 10*3/uL — ABNORMAL LOW (ref 4.0–10.5)

## 2014-10-22 LAB — COMPREHENSIVE METABOLIC PANEL
ALT: 12 U/L — ABNORMAL LOW (ref 17–63)
ANION GAP: 13 (ref 5–15)
AST: 13 U/L — AB (ref 15–41)
Albumin: 3.3 g/dL — ABNORMAL LOW (ref 3.5–5.0)
Alkaline Phosphatase: 40 U/L (ref 38–126)
BUN: 44 mg/dL — ABNORMAL HIGH (ref 6–20)
CO2: 27 mmol/L (ref 22–32)
Calcium: 8.1 mg/dL — ABNORMAL LOW (ref 8.9–10.3)
Chloride: 103 mmol/L (ref 101–111)
Creatinine, Ser: 7.19 mg/dL — ABNORMAL HIGH (ref 0.61–1.24)
GFR, EST AFRICAN AMERICAN: 8 mL/min — AB (ref >60)
GFR, EST NON AFRICAN AMERICAN: 7 mL/min — AB (ref >60)
GLUCOSE: 88 mg/dL (ref 65–99)
Potassium: 3.9 mmol/L (ref 3.5–5.1)
Sodium: 143 mmol/L (ref 135–145)
TOTAL PROTEIN: 6 g/dL — AB (ref 6.5–8.1)
Total Bilirubin: 0.6 mg/dL (ref 0.3–1.2)

## 2014-10-22 LAB — LACTATE DEHYDROGENASE: LDH: 196 U/L — ABNORMAL HIGH (ref 98–192)

## 2014-10-22 LAB — SEDIMENTATION RATE: Sed Rate: 20 mm/hr — ABNORMAL HIGH (ref 0–16)

## 2014-10-22 MED ORDER — ACETAMINOPHEN 325 MG PO TABS
650.0000 mg | ORAL_TABLET | Freq: Once | ORAL | Status: AC
  Administered 2014-10-22: 650 mg via ORAL

## 2014-10-22 MED ORDER — SODIUM CHLORIDE 0.9 % IJ SOLN
10.0000 mL | INTRAMUSCULAR | Status: AC | PRN
  Administered 2014-10-22: 10 mL

## 2014-10-22 MED ORDER — DIPHENHYDRAMINE HCL 25 MG PO CAPS
ORAL_CAPSULE | ORAL | Status: AC
  Filled 2014-10-22: qty 1

## 2014-10-22 MED ORDER — DIPHENHYDRAMINE HCL 25 MG PO CAPS
25.0000 mg | ORAL_CAPSULE | Freq: Once | ORAL | Status: AC
  Administered 2014-10-22: 25 mg via ORAL

## 2014-10-22 MED ORDER — SODIUM CHLORIDE 0.9 % IV SOLN
250.0000 mL | Freq: Once | INTRAVENOUS | Status: AC
  Administered 2014-10-22: 250 mL via INTRAVENOUS

## 2014-10-22 MED ORDER — ACETAMINOPHEN 325 MG PO TABS
ORAL_TABLET | ORAL | Status: AC
  Filled 2014-10-22: qty 2

## 2014-10-22 NOTE — Progress Notes (Signed)
Elvyn is seen in the treatment area for follow-up.  He is tolerating Revlimid 5 mg every other day without breaks in therapy.  He has experienced a difficult time taking the medication in a 21/28 day fashion secondary to rash.  I have informed him that in the future, we may want to re-challenge him yet again.    His Hgb has stablized and he has not required PRBCs in some time.  His platelet count is low and therefore, he is receiving 1 unit of pheresed platelets today for a platelet count less than 20,000.  He denies any active bleeding.  He reports that he feels good today.  He was joking around with me today.  We will try to get labs today since he is currently accessed.  We will perform his MM labs.  If unable, will get them in 2 weeks.  Labs in 2 and 4 weeks (depending on success of blood gathering today).  Return in 4 weeks for follow-up. He is to continue with his every other day dosing of Revlimid.  Patient and plan discussed with Dr. Ancil Linsey and she is in agreement with the aforementioned.   Maryjean Corpening 10/22/2014 3:12 PM

## 2014-10-22 NOTE — Progress Notes (Signed)
Tolerated platelet transfusion well. 

## 2014-10-22 NOTE — Patient Instructions (Signed)
[place] at [facility] Discharge Instructions  RECOMMENDATIONS MADE BY THE CONSULTANT AND ANY TEST RESULTS WILL BE SENT TO YOUR REFERRING PHYSICIAN.  Today you received platelets. Lab work today, in 2 weeks, and 4 weeks. Office visit in 4 weeks.   Thank you for choosing [place] at [facility] to provide your oncology and hematology care.  To afford each patient quality time with our provider, please arrive at least 15 minutes before your scheduled appointment time.    You need to re-schedule your appointment should you arrive 10 or more minutes late.  We strive to give you quality time with our providers, and arriving late affects you and other patients whose appointments are after yours.  Also, if you no show three or more times for appointments you may be dismissed from the clinic at the providers discretion.     Again, thank you for choosing [facility].  Our hope is that these requests will decrease the amount of time that you wait before being seen by our physicians.       _____________________________________________________________  Should you have questions after your visit to [facility], please contact our office at [phone] between the hours of 8:30 a.m. and 4:30 p.m.  Voicemails left after 4:30 p.m. will not be returned until the following business day.  For prescription refill requests, have your pharmacy contact our office.

## 2014-10-22 NOTE — Patient Instructions (Signed)
[place] at [facility] Discharge Instructions  RECOMMENDATIONS MADE BY THE CONSULTANT AND ANY TEST RESULTS WILL BE SENT TO YOUR REFERRING PHYSICIAN.  Platelet transfusion today as ordered. Return as scheduled.  Thank you for choosing [place] at [facility] to provide your oncology and hematology care.  To afford each patient quality time with our provider, please arrive at least 15 minutes before your scheduled appointment time.    You need to re-schedule your appointment should you arrive 10 or more minutes late.  We strive to give you quality time with our providers, and arriving late affects you and other patients whose appointments are after yours.  Also, if you no show three or more times for appointments you may be dismissed from the clinic at the providers discretion.     Again, thank you for choosing [facility].  Our hope is that these requests will decrease the amount of time that you wait before being seen by our physicians.       _____________________________________________________________  Should you have questions after your visit to [facility], please contact our office at [phone] between the hours of 8:30 a.m. and 4:30 p.m.  Voicemails left after 4:30 p.m. will not be returned until the following business day.  For prescription refill requests, have your pharmacy contact our office.

## 2014-10-23 LAB — KAPPA/LAMBDA LIGHT CHAINS
KAPPA FREE LGHT CHN: 208.6 mg/L — AB (ref 3.30–19.40)
Kappa, lambda light chain ratio: 3.04 — ABNORMAL HIGH (ref 0.26–1.65)
Lambda free light chains: 68.53 mg/L — ABNORMAL HIGH (ref 5.71–26.30)

## 2014-10-23 LAB — MULTIPLE MYELOMA PANEL, SERUM
ALPHA2 GLOB SERPL ELPH-MCNC: 0.5 g/dL (ref 0.4–1.2)
Albumin SerPl Elph-Mcnc: 3.3 g/dL (ref 3.2–5.6)
Albumin/Glob SerPl: 1.6 (ref 0.7–2.0)
Alpha 1: 0.2 g/dL (ref 0.1–0.4)
B-GLOBULIN SERPL ELPH-MCNC: 0.8 g/dL (ref 0.6–1.3)
Gamma Glob SerPl Elph-Mcnc: 0.8 g/dL (ref 0.5–1.6)
Globulin, Total: 2.2 g/dL (ref 2.0–4.5)
IGM, SERUM: 98 mg/dL (ref 20–172)
IgA: 115 mg/dL (ref 61–437)
IgG (Immunoglobin G), Serum: 661 mg/dL — ABNORMAL LOW (ref 700–1600)
Total Protein ELP: 5.5 g/dL — ABNORMAL LOW (ref 6.0–8.5)

## 2014-10-23 LAB — PREPARE PLATELET PHERESIS: Unit division: 0

## 2014-10-23 LAB — BETA 2 MICROGLOBULIN, SERUM: Beta-2 Microglobulin: 18.8 mg/L — ABNORMAL HIGH (ref 0.6–2.4)

## 2014-10-29 ENCOUNTER — Other Ambulatory Visit (HOSPITAL_COMMUNITY): Payer: Self-pay

## 2014-11-05 ENCOUNTER — Encounter (HOSPITAL_BASED_OUTPATIENT_CLINIC_OR_DEPARTMENT_OTHER): Payer: Medicare Other

## 2014-11-05 ENCOUNTER — Other Ambulatory Visit (HOSPITAL_COMMUNITY): Payer: Self-pay

## 2014-11-05 ENCOUNTER — Telehealth (HOSPITAL_COMMUNITY): Payer: Self-pay | Admitting: Emergency Medicine

## 2014-11-05 ENCOUNTER — Other Ambulatory Visit (HOSPITAL_COMMUNITY): Payer: Self-pay | Admitting: Oncology

## 2014-11-05 DIAGNOSIS — C9000 Multiple myeloma not having achieved remission: Secondary | ICD-10-CM

## 2014-11-05 DIAGNOSIS — D696 Thrombocytopenia, unspecified: Secondary | ICD-10-CM

## 2014-11-05 LAB — CBC
HCT: 27.9 % — ABNORMAL LOW (ref 39.0–52.0)
Hemoglobin: 8.8 g/dL — ABNORMAL LOW (ref 13.0–17.0)
MCH: 33.5 pg (ref 26.0–34.0)
MCHC: 31.5 g/dL (ref 30.0–36.0)
MCV: 106.1 fL — AB (ref 78.0–100.0)
Platelets: 30 10*3/uL — ABNORMAL LOW (ref 150–400)
RBC: 2.63 MIL/uL — ABNORMAL LOW (ref 4.22–5.81)
RDW: 18.6 % — AB (ref 11.5–15.5)
WBC: 2.8 10*3/uL — AB (ref 4.0–10.5)

## 2014-11-05 NOTE — Telephone Encounter (Signed)
-----   Message from Baird Cancer, PA-C sent at 11/05/2014 10:08 AM EDT ----- Good and stable

## 2014-11-05 NOTE — Telephone Encounter (Signed)
Notified pt that lab work was good and no transfusion was needed

## 2014-11-05 NOTE — Progress Notes (Signed)
Labs drawn

## 2014-11-06 ENCOUNTER — Other Ambulatory Visit (HOSPITAL_COMMUNITY): Payer: Self-pay | Admitting: Oncology

## 2014-11-06 DIAGNOSIS — C9000 Multiple myeloma not having achieved remission: Secondary | ICD-10-CM

## 2014-11-06 MED ORDER — LENALIDOMIDE 5 MG PO CAPS: ORAL_CAPSULE | ORAL | Status: DC

## 2014-11-09 ENCOUNTER — Telehealth (HOSPITAL_COMMUNITY): Payer: Self-pay | Admitting: Oncology

## 2014-11-09 NOTE — Telephone Encounter (Signed)
No answer.  Message left.  KEFALAS,THOMAS 11/09/2014 4:54 PM

## 2014-11-21 ENCOUNTER — Other Ambulatory Visit (HOSPITAL_COMMUNITY): Payer: Medicare Other

## 2014-11-21 ENCOUNTER — Encounter (HOSPITAL_COMMUNITY): Payer: Medicare Other | Attending: Oncology | Admitting: Oncology

## 2014-11-21 ENCOUNTER — Encounter (HOSPITAL_COMMUNITY): Payer: Self-pay | Admitting: Oncology

## 2014-11-21 ENCOUNTER — Encounter (HOSPITAL_BASED_OUTPATIENT_CLINIC_OR_DEPARTMENT_OTHER): Payer: Medicare Other

## 2014-11-21 VITALS — BP 92/50 | HR 50 | Temp 99.2°F | Resp 18 | Wt 244.7 lb

## 2014-11-21 DIAGNOSIS — C9000 Multiple myeloma not having achieved remission: Secondary | ICD-10-CM

## 2014-11-21 DIAGNOSIS — R21 Rash and other nonspecific skin eruption: Secondary | ICD-10-CM

## 2014-11-21 LAB — CBC WITH DIFFERENTIAL/PLATELET
BASOS PCT: 1 % (ref 0–1)
Basophils Absolute: 0 10*3/uL (ref 0.0–0.1)
EOS PCT: 5 % (ref 0–5)
Eosinophils Absolute: 0.1 10*3/uL (ref 0.0–0.7)
HCT: 27 % — ABNORMAL LOW (ref 39.0–52.0)
Hemoglobin: 8.6 g/dL — ABNORMAL LOW (ref 13.0–17.0)
LYMPHS ABS: 1.6 10*3/uL (ref 0.7–4.0)
Lymphocytes Relative: 66 % — ABNORMAL HIGH (ref 12–46)
MCH: 34.1 pg — AB (ref 26.0–34.0)
MCHC: 31.9 g/dL (ref 30.0–36.0)
MCV: 107.1 fL — AB (ref 78.0–100.0)
Monocytes Absolute: 0.1 10*3/uL (ref 0.1–1.0)
Monocytes Relative: 3 % (ref 3–12)
Neutro Abs: 0.6 10*3/uL — ABNORMAL LOW (ref 1.7–7.7)
Neutrophils Relative %: 26 % — ABNORMAL LOW (ref 43–77)
Platelets: 16 10*3/uL — CL (ref 150–400)
RBC: 2.52 MIL/uL — AB (ref 4.22–5.81)
RDW: 19 % — AB (ref 11.5–15.5)
Smear Review: DECREASED
WBC: 2.5 10*3/uL — ABNORMAL LOW (ref 4.0–10.5)

## 2014-11-21 LAB — COMPREHENSIVE METABOLIC PANEL
ALT: 15 U/L — ABNORMAL LOW (ref 17–63)
AST: 18 U/L (ref 15–41)
Albumin: 3.3 g/dL — ABNORMAL LOW (ref 3.5–5.0)
Alkaline Phosphatase: 40 U/L (ref 38–126)
Anion gap: 10 (ref 5–15)
BUN: 40 mg/dL — ABNORMAL HIGH (ref 6–20)
CO2: 29 mmol/L (ref 22–32)
Calcium: 8.5 mg/dL — ABNORMAL LOW (ref 8.9–10.3)
Chloride: 97 mmol/L — ABNORMAL LOW (ref 101–111)
Creatinine, Ser: 6.51 mg/dL — ABNORMAL HIGH (ref 0.61–1.24)
GFR, EST AFRICAN AMERICAN: 9 mL/min — AB (ref >60)
GFR, EST NON AFRICAN AMERICAN: 8 mL/min — AB (ref >60)
GLUCOSE: 130 mg/dL — AB (ref 65–99)
Potassium: 4 mmol/L (ref 3.5–5.1)
Sodium: 136 mmol/L (ref 135–145)
Total Bilirubin: 0.8 mg/dL (ref 0.3–1.2)
Total Protein: 6.2 g/dL — ABNORMAL LOW (ref 6.5–8.1)

## 2014-11-21 LAB — SEDIMENTATION RATE: Sed Rate: 15 mm/hr (ref 0–16)

## 2014-11-21 LAB — C-REACTIVE PROTEIN: CRP: 0.9 mg/dL (ref <1.0)

## 2014-11-21 LAB — LACTATE DEHYDROGENASE: LDH: 148 U/L (ref 98–192)

## 2014-11-21 NOTE — Progress Notes (Signed)
CRITICAL VALUE ALERT  Critical value received:  Platelets 16,000  Date of notification:  11/21/14  Time of notification:  6244  Critical value read back:Yes.    Nurse who received alert:  A. Ouida Sills, RN  MD notified:  Caroleen Hamman, PA-C at [phone]

## 2014-11-21 NOTE — Progress Notes (Signed)
[name], [place] [address]  Multiple myeloma - Plan: Practitioner attestation of consent, Complete patient signature process for consent form, Care order/instruction, 0.9 %  sodium chloride infusion, sodium chloride 0.9 % injection 10 mL, heparin lock flush 100 unit/mL, sodium chloride 0.9 % injection 3 mL, Prepare Pheresed Platelets, Transfuse Pheresed Platelets, acetaminophen (TYLENOL) tablet 650 mg, diphenhydrAMINE (BENADRYL) capsule 25 mg  CURRENT THERAPY: Revlimid 5 mg every other day  INTERVAL HISTORY: BARNABY RIPPEON 69 y.o. male returns for followup of multiple myeloma, S/P 2 autologous BM transplant at [facility] under the guidance of Dr. Marcell Anger (retired) with the second bone marrow transplant occuring in July 2013. Recent bone marrow demonstrates 5% plasma cells and he was seen by Dr. Norma Fredrickson who recommended starting Revlimid at a dose of 5 mg 21/28 days. His Revlimid dosing has been complicated by pruritic rash and [place] has decided to take Revlimid every other day which he is tolerating well.  Will try to increase the dose in the future.    Multiple myeloma   01/26/2011 Initial Diagnosis Multiple myeloma   09/10/2014 - 09/14/2014 Chemotherapy Revlimid 5 mg days 1-21 every 28 days   09/14/2014 Adverse Reaction Rash, Revlimid versus Doxycycline   09/21/2014 - 09/23/2014 Chemotherapy Revlimid 5 mg days 1-21 every 28 days- rechallenge.   09/24/2014 Adverse Reaction Rash.  Revlimid-induced   10/22/2014 -  Chemotherapy Revlimid 5 mg every other day.   I personally reviewed and went over laboratory results with the patient.  The results are noted within this dictation.  Hgb is slowly declining, but he reports the last 2 times at dialysis, his line clotted and quite a bit of blood was disposed of.  His platelet count is less than 20,000, and therefore he needs a platelet transfusion.    He is tolerating Revlimid well without any complaints at this time.   He continues with every other day dosing.  In the future, I will try to convince the patient to re-attempt a dose increased to daily x 21 days.   Past Medical History  Diagnosis Date  . Hypertension   . Ruptured cervical disc   . Fall resulting in striking against other object     paralyzed  . Multiple myeloma   . 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 Multiple myeloma 01/26/2011  . Unspecified vitamin D deficiency 03/06/2013  . Anemia of chronic disease 12/30/2013  . Chronic renal disease, stage 5, glomerular filtration rate less than or equal to 15 mL/min/1.73 square meter 12/30/2013  . Constipation   . Dialysis patient 2016  . Thrombocytopenia 08/31/2014    has ESSENTIAL HYPERTENSION, BENIGN; BRADYCARDIA; Multiple myeloma; Intravenous pamidronate causing adverse effect in therapeutic use; Unspecified vitamin D deficiency; Lymphedema of lower extremity; Anemia of chronic disease; Chronic renal disease, stage 5, glomerular filtration rate less than or equal to 15 mL/min/1.73 square meter; Pathologic fracture; Anemia in chronic kidney disease; and Thrombocytopenia on his problem list.     is allergic to pamidronate.  Current Outpatient Prescriptions on File Prior to Visit  Medication Sig Dispense Refill  . docusate sodium (COLACE) 100 MG capsule Take 100 mg by mouth daily.     . furosemide (LASIX) 20 MG tablet Take 40 mg by mouth daily.    Marland Kitchen lenalidomide (REVLIMID) 5 MG capsule Take one tablet every other day, after dialysis 14 capsule 0  . Omega-3 Fatty Acids (FISH OIL)  1000 MG CAPS Take 2 capsules by mouth daily.    . pregabalin (LYRICA) 75 MG capsule Take 1 capsule (75 mg total) by mouth 2 (two) times daily. 60 capsule 5  . albuterol (PROVENTIL) (2.5 MG/3ML) 0.083% nebulizer solution Take 3 mLs (2.5 mg total) by nebulization every 6 (six) hours as needed for wheezing or shortness of breath. (Patient not taking: Reported on 11/21/2014) 20 vial 1  .  chlorpheniramine-HYDROcodone (TUSSIONEX PENNKINETIC ER) 10-8 MG/5ML SUER Take 5 mLs by mouth every 12 (twelve) hours as needed for cough. (Patient not taking: Reported on 10/05/2014) 100 mL 0  . hydrocortisone cream 0.5 % Apply 1 application topically 2 (two) times daily. (Patient not taking: Reported on 09/21/2014) 30 g 0  . oxyCODONE-acetaminophen (ROXICET) 5-325 MG per tablet Take 1 tablet by mouth every 6 (six) hours as needed for severe pain. (Patient not taking: Reported on 11/21/2014) 15 tablet 0   No current facility-administered medications on file prior to visit.    Past Surgical History  Procedure Laterality Date  . Inguinal hernia repair Bilateral   . Anterior cervical decomp/discectomy fusion    . Bone marrow aspirate and biopsy wiith lumbar puncture    . Melanoma excision      rt. breast  . Cervical spine surgery    . Kyphoplasty Bilateral 01/30/2014    Procedure: Thoracic eleven Kyphoplasty;  Surgeon: Consuella Lose, MD;  Location: [facility];  Service: Neurosurgery;  Laterality: Bilateral;  Thoracic eleven Kyphoplasty  . Av fistula placement Right 04/30/2014    Procedure: BRACHIOCEPHALIC ARTERIOVENOUS (AV) FISTULA CREATION;  Surgeon: Conrad Alvo, MD;  Location: [place];  Service: Vascular;  Laterality: Right;    Denies any headaches, dizziness, double vision, fevers, chills, night sweats, nausea, vomiting, diarrhea, constipation, chest pain, heart palpitations, shortness of breath, blood in stool, black tarry stool, urinary pain, urinary burning, urinary frequency, hematuria.   PHYSICAL EXAMINATION  ECOG PERFORMANCE STATUS: 1 - Symptomatic but completely ambulatory  Filed Vitals:   11/21/14 1248  BP: 92/50  Pulse: 50  Temp: 99.2 F (37.3 C)  Resp: 18    GENERAL:alert, no distress, well nourished, well developed, comfortable, cooperative, obese and smiling SKIN: skin color, texture, turgor are normal, no rashes or significant lesions HEAD: Normocephalic, No masses,  lesions, tenderness or abnormalities EYES: normal, PERRLA, EOMI, Conjunctiva are pink and non-injected EARS: External ears normal OROPHARYNX:lips, buccal mucosa, and tongue normal and mucous membranes are moist  NECK: supple, trachea midline LYMPH:  not examined BREAST:not examined ABDOMEN:obese BACK: Back symmetric, no curvature. EXTREMITIES:less then 2 second capillary refill, no joint deformities, effusion, or inflammation, no skin discoloration  NEURO: alert & oriented x 3 with fluent speech, no focal motor/sensory deficits, gait normal   LABORATORY DATA: CBC    Component Value Date/Time   WBC 2.5* 11/21/2014 1217   RBC 2.52* 11/21/2014 1217   HGB 8.6* 11/21/2014 1217   HCT 27.0* 11/21/2014 1217   PLT 16* 11/21/2014 1217   MCV 107.1* 11/21/2014 1217   MCH 34.1* 11/21/2014 1217   MCHC 31.9 11/21/2014 1217   RDW 19.0* 11/21/2014 1217   LYMPHSABS 1.6 11/21/2014 1217   MONOABS 0.1 11/21/2014 1217   EOSABS 0.1 11/21/2014 1217   BASOSABS 0.0 11/21/2014 1217      Chemistry      Component Value Date/Time   NA 136 11/21/2014 1217   K 4.0 11/21/2014 1217   CL 97* 11/21/2014 1217   CO2 29 11/21/2014 1217   BUN 40* 11/21/2014  1217   CREATININE 6.51* 11/21/2014 1217   CREATININE 43.09* 01/26/2012 1052      Component Value Date/Time   CALCIUM 8.5* 11/21/2014 1217   ALKPHOS 40 11/21/2014 1217   AST 18 11/21/2014 1217   ALT 15* 11/21/2014 1217   BILITOT 0.8 11/21/2014 1217        PENDING LABS:   RADIOGRAPHIC STUDIES:  No results found.   PATHOLOGY:    ASSESSMENT AND PLAN:  Multiple myeloma Multiple myeloma, S/P 2 autologous BM transplant at [facility] under the guidance of Dr. Marcell Anger (retired) with the second bone marrow transplant occuring in July 2013. Recent bone marrow demonstrates 5% plasma cells and he was seen by Dr. Norma Fredrickson who recommended starting Revlimid at a dose of 5 mg 21/28 days. His Revlimid dosing has been complicated by pruritic rash and [place] has  decided to take Revlimid every other day which he is tolerating well.  Will try to increase the dose in the future.  Oncology history updated.  Labs today: CBC diff, CMET, LDH, ESR, CRP, B2M, MM panel.  Labs in 2 weeks: CBC.  Platelet count is 16,000.  Will set-up for transfusion on Friday for 1 unit pheresed platelets.  Return in 4 weeks for follow-up.   THERAPY PLAN:  Continue Revlimid every other day.  Will try to increase dose to 21/28 days in the future.  All questions were answered. The patient knows to call the clinic with any problems, questions or concerns. We can certainly see the patient much sooner if necessary.  30 minutes spent in face to face discussion with the patient with an additional 10 minutes in documenting.  Patient and plan discussed with Dr. Ancil Linsey and she is in agreement with the aforementioned.   This note is electronically signed by: Robynn Pane, PA-C 11/21/2014 1:48 PM

## 2014-11-21 NOTE — Patient Instructions (Signed)
[place] at [facility] Discharge Instructions  RECOMMENDATIONS MADE BY THE CONSULTANT AND ANY TEST RESULTS WILL BE SENT TO YOUR REFERRING PHYSICIAN.  Exam and discussion today with Kirby Crigler, PA Platelet transfusion on Friday at 11:45 AM Labs in 2 weeks and 4 weeks.  Thank you for choosing [place] at [facility] to provide your oncology and hematology care.  To afford each patient quality time with our provider, please arrive at least 15 minutes before your scheduled appointment time.    You need to re-schedule your appointment should you arrive 10 or more minutes late.  We strive to give you quality time with our providers, and arriving late affects you and other patients whose appointments are after yours.  Also, if you no show three or more times for appointments you may be dismissed from the clinic at the providers discretion.     Again, thank you for choosing [facility].  Our hope is that these requests will decrease the amount of time that you wait before being seen by our physicians.       _____________________________________________________________  Should you have questions after your visit to [facility], please contact our office at [phone] between the hours of 8:30 a.m. and 4:30 p.m.  Voicemails left after 4:30 p.m. will not be returned until the following business day.  For prescription refill requests, have your pharmacy contact our office.

## 2014-11-21 NOTE — Assessment & Plan Note (Addendum)
Multiple myeloma, S/P 2 autologous BM transplant at [facility] under the guidance of Dr. Marcell Anger (retired) with the second bone marrow transplant occuring in July 2013. Recent bone marrow demonstrates 5% plasma cells and he was seen by Dr. Norma Fredrickson who recommended starting Revlimid at a dose of 5 mg 21/28 days. His Revlimid dosing has been complicated by pruritic rash and [place] has decided to take Revlimid every other day which he is tolerating well.  Will try to increase the dose in the future.  Oncology history updated.  Labs today: CBC diff, CMET, LDH, ESR, CRP, B2M, MM panel.  Labs in 2 weeks: CBC.  Platelet count is 16,000.  Will set-up for transfusion on Friday for 1 unit pheresed platelets.  Return in 4 weeks for follow-up.

## 2014-11-21 NOTE — Progress Notes (Signed)
Labs drawn

## 2014-11-22 LAB — KAPPA/LAMBDA LIGHT CHAINS
KAPPA, LAMDA LIGHT CHAIN RATIO: 2.19 — AB (ref 0.26–1.65)
Kappa free light chain: 241.72 mg/L — ABNORMAL HIGH (ref 3.30–19.40)
Lambda free light chains: 110.32 mg/L — ABNORMAL HIGH (ref 5.71–26.30)

## 2014-11-22 LAB — BETA 2 MICROGLOBULIN, SERUM: Beta-2 Microglobulin: 15.1 mg/L — ABNORMAL HIGH (ref 0.6–2.4)

## 2014-11-23 ENCOUNTER — Encounter (HOSPITAL_BASED_OUTPATIENT_CLINIC_OR_DEPARTMENT_OTHER): Payer: Medicare Other

## 2014-11-23 VITALS — BP 120/60 | HR 55 | Temp 97.9°F | Resp 16

## 2014-11-23 DIAGNOSIS — C9000 Multiple myeloma not having achieved remission: Secondary | ICD-10-CM | POA: Diagnosis not present

## 2014-11-23 DIAGNOSIS — D61818 Other pancytopenia: Secondary | ICD-10-CM | POA: Diagnosis not present

## 2014-11-23 LAB — MULTIPLE MYELOMA PANEL, SERUM
ALPHA 1: 0.2 g/dL (ref 0.0–0.4)
ALPHA2 GLOB SERPL ELPH-MCNC: 0.4 g/dL (ref 0.4–1.0)
Albumin SerPl Elph-Mcnc: 3.5 g/dL (ref 2.9–4.4)
Albumin/Glob SerPl: 1.5 (ref 0.7–1.7)
B-Globulin SerPl Elph-Mcnc: 0.9 g/dL (ref 0.7–1.3)
Gamma Glob SerPl Elph-Mcnc: 0.9 g/dL (ref 0.4–1.8)
Globulin, Total: 2.4 g/dL (ref 2.2–3.9)
IgA: 168 mg/dL (ref 61–437)
IgG (Immunoglobin G), Serum: 814 mg/dL (ref 700–1600)
IgM, Serum: 87 mg/dL (ref 20–172)
TOTAL PROTEIN ELP: 5.9 g/dL — AB (ref 6.0–8.5)

## 2014-11-23 MED ORDER — DIPHENHYDRAMINE HCL 25 MG PO CAPS
ORAL_CAPSULE | ORAL | Status: AC
  Filled 2014-11-23: qty 1

## 2014-11-23 MED ORDER — DIPHENHYDRAMINE HCL 25 MG PO CAPS
25.0000 mg | ORAL_CAPSULE | Freq: Once | ORAL | Status: AC
  Administered 2014-11-23: 25 mg via ORAL

## 2014-11-23 MED ORDER — SODIUM CHLORIDE 0.9 % IV SOLN
250.0000 mL | Freq: Once | INTRAVENOUS | Status: AC
  Administered 2014-11-23: 250 mL via INTRAVENOUS

## 2014-11-23 MED ORDER — ACETAMINOPHEN 325 MG PO TABS
650.0000 mg | ORAL_TABLET | Freq: Once | ORAL | Status: AC
  Administered 2014-11-23: 650 mg via ORAL

## 2014-11-23 MED ORDER — ACETAMINOPHEN 325 MG PO TABS
ORAL_TABLET | ORAL | Status: AC
  Filled 2014-11-23: qty 2

## 2014-11-23 NOTE — Patient Instructions (Signed)
[place] at [facility] Discharge Instructions  RECOMMENDATIONS MADE BY THE CONSULTANT AND ANY TEST RESULTS WILL BE SENT TO YOUR REFERRING PHYSICIAN.  Platelet transfusion today as ordered. Return as scheduled.  Thank you for choosing [place] at [facility] to provide your oncology and hematology care.  To afford each patient quality time with our provider, please arrive at least 15 minutes before your scheduled appointment time.    You need to re-schedule your appointment should you arrive 10 or more minutes late.  We strive to give you quality time with our providers, and arriving late affects you and other patients whose appointments are after yours.  Also, if you no show three or more times for appointments you may be dismissed from the clinic at the providers discretion.     Again, thank you for choosing [facility].  Our hope is that these requests will decrease the amount of time that you wait before being seen by our physicians.       _____________________________________________________________  Should you have questions after your visit to [facility], please contact our office at [phone] between the hours of 8:30 a.m. and 4:30 p.m.  Voicemails left after 4:30 p.m. will not be returned until the following business day.  For prescription refill requests, have your pharmacy contact our office.

## 2014-11-23 NOTE — Progress Notes (Signed)
Nurse tech obtained platelets from blood bank. Upon 1st RN verifying platelets against blood slip noted expiration date on bag differed from blood slip. Platelets returned to blood bank for correction.  Tolerated platelet transfusion well.

## 2014-11-25 LAB — PREPARE PLATELET PHERESIS: Unit division: 0

## 2014-12-01 ENCOUNTER — Observation Stay (HOSPITAL_COMMUNITY)
Admission: EM | Admit: 2014-12-01 | Discharge: 2014-12-02 | Disposition: A | Discharge status: Home / Self Care | Payer: Medicare Other | Attending: Internal Medicine | Admitting: Internal Medicine

## 2014-12-01 ENCOUNTER — Encounter (HOSPITAL_COMMUNITY): Payer: Self-pay | Admitting: *Deleted

## 2014-12-01 DIAGNOSIS — T8249XA Other complication of vascular dialysis catheter, initial encounter: Secondary | ICD-10-CM | POA: Diagnosis not present

## 2014-12-01 DIAGNOSIS — N186 End stage renal disease: Secondary | ICD-10-CM | POA: Insufficient documentation

## 2014-12-01 DIAGNOSIS — T829XXA Unspecified complication of cardiac and vascular prosthetic device, implant and graft, initial encounter: Secondary | ICD-10-CM

## 2014-12-01 DIAGNOSIS — K5900 Constipation, unspecified: Secondary | ICD-10-CM | POA: Insufficient documentation

## 2014-12-01 DIAGNOSIS — Z8579 Personal history of other malignant neoplasms of lymphoid, hematopoietic and related tissues: Secondary | ICD-10-CM | POA: Insufficient documentation

## 2014-12-01 DIAGNOSIS — C9000 Multiple myeloma not having achieved remission: Secondary | ICD-10-CM | POA: Diagnosis present

## 2014-12-01 DIAGNOSIS — Z79899 Other long term (current) drug therapy: Secondary | ICD-10-CM | POA: Insufficient documentation

## 2014-12-01 DIAGNOSIS — Z992 Dependence on renal dialysis: Secondary | ICD-10-CM | POA: Diagnosis not present

## 2014-12-01 DIAGNOSIS — I10 Essential (primary) hypertension: Secondary | ICD-10-CM | POA: Diagnosis present

## 2014-12-01 DIAGNOSIS — D696 Thrombocytopenia, unspecified: Secondary | ICD-10-CM | POA: Diagnosis not present

## 2014-12-01 DIAGNOSIS — M5020 Other cervical disc displacement, unspecified cervical region: Secondary | ICD-10-CM | POA: Insufficient documentation

## 2014-12-01 DIAGNOSIS — R58 Hemorrhage, not elsewhere classified: Secondary | ICD-10-CM

## 2014-12-01 DIAGNOSIS — E559 Vitamin D deficiency, unspecified: Secondary | ICD-10-CM | POA: Diagnosis not present

## 2014-12-01 DIAGNOSIS — Y841 Kidney dialysis as the cause of abnormal reaction of the patient, or of later complication, without mention of misadventure at the time of the procedure: Secondary | ICD-10-CM | POA: Diagnosis not present

## 2014-12-01 DIAGNOSIS — D689 Coagulation defect, unspecified: Secondary | ICD-10-CM | POA: Diagnosis present

## 2014-12-01 DIAGNOSIS — I120 Hypertensive chronic kidney disease with stage 5 chronic kidney disease or end stage renal disease: Secondary | ICD-10-CM | POA: Diagnosis not present

## 2014-12-01 LAB — CBC WITH DIFFERENTIAL/PLATELET
BASOS ABS: 0 10*3/uL (ref 0.0–0.1)
Basophils Relative: 1 % (ref 0–1)
Eosinophils Absolute: 0.1 10*3/uL (ref 0.0–0.7)
Eosinophils Relative: 7 % — ABNORMAL HIGH (ref 0–5)
HCT: 27 % — ABNORMAL LOW (ref 39.0–52.0)
Hemoglobin: 8.8 g/dL — ABNORMAL LOW (ref 13.0–17.0)
LYMPHS PCT: 59 % — AB (ref 12–46)
Lymphs Abs: 1.2 10*3/uL (ref 0.7–4.0)
MCH: 35.1 pg — ABNORMAL HIGH (ref 26.0–34.0)
MCHC: 32.6 g/dL (ref 30.0–36.0)
MCV: 107.6 fL — AB (ref 78.0–100.0)
MONO ABS: 0.1 10*3/uL (ref 0.1–1.0)
Monocytes Relative: 7 % (ref 3–12)
NEUTROS PCT: 27 % — AB (ref 43–77)
Neutro Abs: 0.5 10*3/uL — ABNORMAL LOW (ref 1.7–7.7)
PLATELETS: 14 10*3/uL — AB (ref 150–400)
RBC: 2.51 MIL/uL — AB (ref 4.22–5.81)
RDW: 18.5 % — ABNORMAL HIGH (ref 11.5–15.5)
WBC: 2 10*3/uL — AB (ref 4.0–10.5)

## 2014-12-01 NOTE — ED Notes (Signed)
Pt reporting bleeding from dialysis access.  States site has been oozing since aprox 2:30.

## 2014-12-01 NOTE — ED Provider Notes (Signed)
TIME SEEN: This chart was scribed for [place], DO by Julien Nordmann, ED Scribe. This patient was seen in room APA04/APA04 and the patient's care was started at 11:10 PM.   CHIEF COMPLAINT: Coagulation disorder  HPI:  HPI Comments: [name] is a 69 y.o. male with history of hypertension, multiple myeloma, end-stage renal disease on hemodialysis Tuesday, Thursday and Saturday he was last dialyzed today who presents to the Emergency Department complaining of bleeding from his right upper extremity AV fistula after dialysis since approximately 2 PM today. Pt reports having dialysis this morning and reports he has been dripping from the area after the needle came out. Patient does have a history of thrombocytopenia and received 1 unit of platelets on 11/23/14. Pt has not missed any dialysis treatments lately.  He denies being on anticoagulation, antiplatelets agent. No bloody stool or melena. No bleeding from his gums. No easy bruising.  ROS: See HPI Constitutional: no fever  Eyes: no drainage  ENT: no runny nose   Cardiovascular:  no chest pain  Resp: no SOB  GI: no vomiting GU: no dysuria Integumentary: no rash  Allergy: no hives  Musculoskeletal: no leg swelling  Neurological: no slurred speech ROS otherwise negative  PAST MEDICAL HISTORY/PAST SURGICAL HISTORY:  Past Medical History  Diagnosis Date  . Hypertension   . Ruptured cervical disc   . Fall resulting in striking against other object     paralyzed  . Multiple myeloma   . 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 Multiple myeloma 01/26/2011  . Unspecified vitamin D deficiency 03/06/2013  . Anemia of chronic disease 12/30/2013  . Chronic renal disease, stage 5, glomerular filtration rate less than or equal to 15 mL/min/1.73 square meter 12/30/2013  . Constipation   . Dialysis patient 2016  . Thrombocytopenia 08/31/2014    MEDICATIONS:  Prior to Admission medications   Medication Sig  Start Date End Date Taking? Authorizing Provider  albuterol (PROVENTIL) (2.5 MG/3ML) 0.083% nebulizer solution Take 3 mLs (2.5 mg total) by nebulization every 6 (six) hours as needed for wheezing or shortness of breath. Patient not taking: Reported on 11/21/2014 09/28/14   Baird Cancer, PA-C  chlorpheniramine-HYDROcodone [facility]) 10-8 MG/5ML SUER Take 5 mLs by mouth every 12 (twelve) hours as needed for cough. Patient not taking: Reported on 10/05/2014 10/03/14   Milton Ferguson, MD  docusate sodium (COLACE) 100 MG capsule Take 100 mg by mouth daily.     Historical Provider, MD  furosemide (LASIX) 20 MG tablet Take 40 mg by mouth daily.    Historical Provider, MD  hydrocortisone cream 0.5 % Apply 1 application topically 2 (two) times daily. Patient not taking: Reported on 09/21/2014 09/14/14   Baird Cancer, PA-C  lenalidomide (REVLIMID) 5 MG capsule Take one tablet every other day, after dialysis 11/06/14   Baird Cancer, PA-C  metoprolol succinate (TOPROL-XL) 25 MG 24 hr tablet Take 25 mg by mouth daily. 11/14/14   Historical Provider, MD  Omega-3 Fatty Acids (FISH OIL) 1000 MG CAPS Take 2 capsules by mouth daily.    Historical Provider, MD  oxyCODONE-acetaminophen (ROXICET) 5-325 MG per tablet Take 1 tablet by mouth every 6 (six) hours as needed for severe pain. Patient not taking: Reported on 11/21/2014 04/30/14   Alvia Grove, PA-C  pregabalin (LYRICA) 75 MG capsule Take 1 capsule (75 mg total) by mouth 2 (two) times daily. 09/21/14   Baird Cancer, PA-C  ALLERGIES:  Allergies  Allergen Reactions  . Pamidronate Anaphylaxis    SOCIAL HISTORY:  History  Substance Use Topics  . Smoking status: Never Smoker   . Smokeless tobacco: Never Used  . Alcohol Use: No    FAMILY HISTORY: Family History  Problem Relation Age of Onset  . Cancer Sister   . Cancer Brother     EXAM: Triage vitals: BP 158/67 mmHg  Pulse 76  Temp(Src) 98.4 F (36.9 C) (Oral)  Resp 22   Ht _0  (1.803 m)  Wt 240 lb (108.863 kg)  BMI 33.49 kg/m2  SpO2 100% CONSTITUTIONAL: Alert and oriented and responds appropriately to questions. Well-appearing; well-nourished HEAD: Normocephalic EYES: Conjunctivae clear, PERRL ENT: normal nose; no rhinorrhea; moist mucous membranes; pharynx without lesions noted NECK: Supple, no meningismus, no LAD  CARD: RRR; S1 and S2 appreciated; no murmurs, no clicks, no rubs, no gallops RESP: Normal chest excursion without splinting or tachypnea; breath sounds clear and equal bilaterally; no wheezes, no rhonchi, no rales, no hypoxia or respiratory distress, speaking full sentences ABD/GI: Normal bowel sounds; non-distended; soft, non-tender, no rebound, no guarding, no peritoneal signs BACK:  The back appears normal and is non-tender to palpation, there is no CVA tenderness EXT: Normal ROM in all joints; non-tender to palpation; no edema; normal capillary refill; no cyanosis, no calf tenderness or swelling ; ab fistual in RUE with good thrill and 2+ radial pulse SKIN: Normal color for age and race; warm; small puncture site oozing dark red blood no hematoma, soft compartments NEURO: Moves all extremities equally, sensation to light touch intact diffusely, cranial nerves II through XII intact PSYCH: The patient's mood and manner are appropriate. Grooming and personal hygiene are appropriate.  MEDICAL DECISION MAKING: Patient here with bleeding from his dialysis site. Platelets today are 14,000. We'll transfuse 2 units of platelets and admit. Have applied a quick clot and pressure bandage to bleeding dialysis site. It is a small puncture that is oozing slowly. Patient's hematin dynamically stable. Hemoglobin today is 8.8.   1:00 AM  Discussed with Dr. Maudie Mercury for admission to observation, telemetry bed.  CRITICAL CARE Performed by: Nyra Jabs   Total critical care time: 35 minutes - thrombocytopenia requiring transfusion  Critical care time was  exclusive of separately billable procedures and treating other patients.  Critical care was necessary to treat or prevent imminent or life-threatening deterioration.  Critical care was time spent personally by me on the following activities: development of treatment plan with patient and/or surrogate as well as nursing, discussions with consultants, evaluation of patient's response to treatment, examination of patient, obtaining history from patient or surrogate, ordering and performing treatments and interventions, ordering and review of laboratory studies, ordering and review of radiographic studies, pulse oximetry and re-evaluation of patient's condition.     I personally performed the services described in this documentation, which was scribed in my presence. The recorded information has been reviewed and is accurate.   [place], DO 12/02/14 0104

## 2014-12-01 NOTE — ED Notes (Signed)
CRITICAL VALUE ALERT  Critical value received:  plt 14000  Date of notification:  12/01/14  Time of notification:  2351  Critical value read back:Yes.    Nurse who received alert:  rbh  MD notified (1st page):  Ward  Time of first page:  2351  MD notified (2nd page):  Time of second page:  Responding MD:  Ward  Time MD responded:  2351

## 2014-12-02 ENCOUNTER — Encounter (HOSPITAL_COMMUNITY): Payer: Self-pay | Admitting: Internal Medicine

## 2014-12-02 DIAGNOSIS — R58 Hemorrhage, not elsewhere classified: Secondary | ICD-10-CM

## 2014-12-02 DIAGNOSIS — D696 Thrombocytopenia, unspecified: Secondary | ICD-10-CM | POA: Diagnosis not present

## 2014-12-02 LAB — SAMPLE TO BLOOD BANK

## 2014-12-02 LAB — TYPE AND SCREEN
ABO/RH(D): A POS
ANTIBODY SCREEN: NEGATIVE

## 2014-12-02 LAB — PREPARE PLATELET PHERESIS
UNIT DIVISION: 0
Unit division: 0

## 2014-12-02 LAB — BASIC METABOLIC PANEL
ANION GAP: 10 (ref 5–15)
BUN: 22 mg/dL — ABNORMAL HIGH (ref 6–20)
CO2: 30 mmol/L (ref 22–32)
Calcium: 8.8 mg/dL — ABNORMAL LOW (ref 8.9–10.3)
Chloride: 100 mmol/L — ABNORMAL LOW (ref 101–111)
Creatinine, Ser: 3.92 mg/dL — ABNORMAL HIGH (ref 0.61–1.24)
GFR calc Af Amer: 17 mL/min — ABNORMAL LOW (ref >60)
GFR calc non Af Amer: 15 mL/min — ABNORMAL LOW (ref >60)
Glucose, Bld: 109 mg/dL — ABNORMAL HIGH (ref 65–99)
Potassium: 4.1 mmol/L (ref 3.5–5.1)
SODIUM: 140 mmol/L (ref 135–145)

## 2014-12-02 LAB — APTT: aPTT: 28 seconds (ref 24–37)

## 2014-12-02 LAB — LACTATE DEHYDROGENASE: LDH: 147 U/L (ref 98–192)

## 2014-12-02 LAB — PROTIME-INR
INR: 1.12 (ref 0.00–1.49)
Prothrombin Time: 14.6 seconds (ref 11.6–15.2)

## 2014-12-02 MED ORDER — ACETAMINOPHEN 325 MG PO TABS: 650.0000 mg | ORAL_TABLET | Freq: Four times a day (QID) | ORAL | Status: DC | PRN

## 2014-12-02 MED ORDER — FUROSEMIDE 40 MG PO TABS
40.0000 mg | ORAL_TABLET | Freq: Every day | ORAL | Status: DC
  Administered 2014-12-02: 40 mg via ORAL
  Filled 2014-12-02: qty 1

## 2014-12-02 MED ORDER — SODIUM CHLORIDE 0.9 % IV SOLN: INTRAVENOUS | Status: DC

## 2014-12-02 MED ORDER — CALCIUM ACETATE (PHOS BINDER) 667 MG PO CAPS
2001.0000 mg | ORAL_CAPSULE | Freq: Three times a day (TID) | ORAL | Status: DC
  Administered 2014-12-02: 2001 mg via ORAL
  Filled 2014-12-02 (×2): qty 3

## 2014-12-02 MED ORDER — PREGABALIN 75 MG PO CAPS
75.0000 mg | ORAL_CAPSULE | Freq: Two times a day (BID) | ORAL | Status: DC
  Administered 2014-12-02: 75 mg via ORAL
  Filled 2014-12-02 (×2): qty 1

## 2014-12-02 MED ORDER — DOCUSATE SODIUM 100 MG PO CAPS
100.0000 mg | ORAL_CAPSULE | Freq: Every day | ORAL | Status: DC
  Administered 2014-12-02: 100 mg via ORAL
  Filled 2014-12-02: qty 1

## 2014-12-02 MED ORDER — ACETAMINOPHEN 650 MG RE SUPP: 650.0000 mg | Freq: Four times a day (QID) | RECTAL | Status: DC | PRN

## 2014-12-02 MED ORDER — SODIUM CHLORIDE 0.9 % IV SOLN: Freq: Once | INTRAVENOUS | Status: DC

## 2014-12-02 MED ORDER — ACETAMINOPHEN 325 MG PO TABS
650.0000 mg | ORAL_TABLET | Freq: Once | ORAL | Status: AC
  Administered 2014-12-02: 650 mg via ORAL
  Filled 2014-12-02: qty 2

## 2014-12-02 MED ORDER — DIPHENHYDRAMINE HCL 25 MG PO CAPS
25.0000 mg | ORAL_CAPSULE | Freq: Once | ORAL | Status: AC
  Administered 2014-12-02: 25 mg via ORAL
  Filled 2014-12-02: qty 1

## 2014-12-02 MED ORDER — OMEGA-3-ACID ETHYL ESTERS 1 G PO CAPS
1.0000 g | ORAL_CAPSULE | Freq: Every day | ORAL | Status: DC
  Administered 2014-12-02: 1 g via ORAL
  Filled 2014-12-02: qty 1

## 2014-12-02 MED ORDER — SODIUM CHLORIDE 0.9 % IV SOLN: 10.0000 mL/h | Freq: Once | INTRAVENOUS | Status: DC

## 2014-12-02 MED ORDER — METOPROLOL SUCCINATE ER 25 MG PO TB24: 25.0000 mg | ORAL_TABLET | Freq: Every day | ORAL | Status: DC

## 2014-12-02 MED ORDER — SODIUM CHLORIDE 0.9 % IJ SOLN
3.0000 mL | Freq: Two times a day (BID) | INTRAMUSCULAR | Status: DC
  Administered 2014-12-02: 3 mL via INTRAVENOUS

## 2014-12-02 NOTE — Progress Notes (Signed)
Late entry for 12/02/14 0305, placed call to on-call MD, pt did not want to get IV fluids b/c he is dialysis pt and experiences fluid overload. Followed orders given to d/c fluid orders. Will continue to monitor the patient.

## 2014-12-02 NOTE — ED Notes (Signed)
Patient states that his BP is slightly elevated." States that its probably up from being stuck so many times".

## 2014-12-02 NOTE — Progress Notes (Signed)
Notified Lab blood bank to inquire about the patients platelets. Tech stated that the platelets were here but he was having trouble issuing the blood to him.  He states he has notified [facility] supervisor and will call me when I can pick up the blood.

## 2014-12-02 NOTE — ED Notes (Signed)
Attempted to start IV x2, pt very agitated and would not relax arm. Informed charge nurse so that she could attempt IV access. Redressed av fistula with wound seal and 4x4's. Pt remains agitated and rude to nurse.

## 2014-12-02 NOTE — Progress Notes (Signed)
Late entry for 12/02/14 at [place]. Paged lab to get status on platelets, they had not arrived. Per lab I will get a call when they are ready.

## 2014-12-02 NOTE — H&P (Signed)
[name] is an 69 y.o. male.    Pcp: unassigned Publishing copy Complaint: bleeding HPI: 69 yo male with ESRD on HD, apparently c/o bleeding startnig about 4 hours after dialysis.  Had bleeding where the needle was for his dialysis.  Pt is doing better with pressure and ED requested that he be admitted for thrombocytopenia.    Past Medical History  Diagnosis Date  . Hypertension   . Ruptured cervical disc   . Fall resulting in striking against other object     paralyzed  . Multiple myeloma   . 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 Multiple myeloma 01/26/2011  . Unspecified vitamin D deficiency 03/06/2013  . Anemia of chronic disease 12/30/2013  . Chronic renal disease, stage 5, glomerular filtration rate less than or equal to 15 mL/min/1.73 square meter 12/30/2013  . Constipation   . Dialysis patient 2016  . Thrombocytopenia 08/31/2014    Past Surgical History  Procedure Laterality Date  . Inguinal hernia repair Bilateral   . Anterior cervical decomp/discectomy fusion    . Bone marrow aspirate and biopsy wiith lumbar puncture    . Melanoma excision      rt. breast  . Cervical spine surgery    . Kyphoplasty Bilateral 01/30/2014    Procedure: Thoracic eleven Kyphoplasty;  Surgeon: Consuella Lose, MD;  Location: [facility];  Service: Neurosurgery;  Laterality: Bilateral;  Thoracic eleven Kyphoplasty  . Av fistula placement Right 04/30/2014    Procedure: BRACHIOCEPHALIC ARTERIOVENOUS (AV) FISTULA CREATION;  Surgeon: Conrad Donald, MD;  Location: [place];  Service: Vascular;  Laterality: Right;    Family History  Problem Relation Age of Onset  . Cancer Sister   . Cancer Brother    Social History:  reports that he has never smoked. He has never used smokeless tobacco. He reports that he does not drink alcohol or use illicit drugs.  Allergies:  Allergies  Allergen Reactions  . Pamidronate Anaphylaxis    Blood pressure   Medications  reviewed  Results for orders placed or performed during the hospital encounter of 12/01/14 (from the past 48 hour(s))  CBC with Differential     Status: Abnormal   Collection Time: 12/01/14 11:19 PM  Result Value Ref Range   WBC 2.0 (L) 4.0 - 10.5 K/uL   RBC 2.51 (L) 4.22 - 5.81 MIL/uL   Hemoglobin 8.8 (L) 13.0 - 17.0 g/dL   HCT 27.0 (L) 39.0 - 52.0 %   MCV 107.6 (H) 78.0 - 100.0 fL   MCH 35.1 (H) 26.0 - 34.0 pg   MCHC 32.6 30.0 - 36.0 g/dL   RDW 18.5 (H) 11.5 - 15.5 %   Platelets 14 (LL) 150 - 400 K/uL    Comment: SPECIMEN CHECKED FOR CLOTS CONSISTENT WITH PREVIOUS RESULT RESULT REPEATED AND VERIFIED CRITICAL RESULT CALLED TO, READ BACK BY AND VERIFIED WITH:  HILTON,R @ 2350 ON 12/01/14 BY WOODIE,J    Neutrophils Relative % 27 (L) 43 - 77 %   Neutro Abs 0.5 (L) 1.7 - 7.7 K/uL   Lymphocytes Relative 59 (H) 12 - 46 %   Lymphs Abs 1.2 0.7 - 4.0 K/uL   Monocytes Relative 7 3 - 12 %   Monocytes Absolute 0.1 0.1 - 1.0 K/uL   Eosinophils Relative 7 (H) 0 - 5 %   Eosinophils Absolute 0.1 0.0 - 0.7 K/uL   Basophils Relative 1 0 - 1 %   Basophils Absolute 0.0  0.0 - 0.1 K/uL  Basic metabolic panel     Status: Abnormal   Collection Time: 12/01/14 11:19 PM  Result Value Ref Range   Sodium 140 135 - 145 mmol/L   Potassium 4.1 3.5 - 5.1 mmol/L   Chloride 100 (L) 101 - 111 mmol/L   CO2 30 22 - 32 mmol/L   Glucose, Bld 109 (H) 65 - 99 mg/dL   BUN 22 (H) 6 - 20 mg/dL   Creatinine, Ser 3.92 (H) 0.61 - 1.24 mg/dL   Calcium 8.8 (L) 8.9 - 10.3 mg/dL   GFR calc non Af Amer 15 (L) >60 mL/min   GFR calc Af Amer 17 (L) >60 mL/min    Comment: (NOTE) The eGFR has been calculated using the CKD EPI equation. This calculation has not been validated in all clinical situations. eGFR's persistently <60 mL/min signify possible Chronic Kidney Disease.    Anion gap 10 5 - 15  Sample to Blood Bank     Status: None   Collection Time: 12/01/14 11:19 PM  Result Value Ref Range   Blood Bank Specimen  BBHLD    Sample Expiration 12/02/2014   Type and screen     Status: None   Collection Time: 12/01/14 11:19 PM  Result Value Ref Range   ABO/RH(D) A POS    Antibody Screen NEG    Sample Expiration 12/04/2014    No results found.  Review of Systems  Constitutional: Negative.   HENT: Negative.   Eyes: Negative.   Respiratory: Negative.   Cardiovascular: Negative.   Gastrointestinal: Negative.   Genitourinary: Negative.   Musculoskeletal: Negative.   Skin: Negative.   Neurological: Negative.   Endo/Heme/Allergies: Negative for environmental allergies and polydipsia. Bruises/bleeds easily.  Psychiatric/Behavioral: Negative.     Blood pressure 145/97, pulse 81, temperature 98.1 F (36.7 C), temperature source Oral, resp. rate 20, height _0  (1.803 m), weight 108.863 kg (240 lb), SpO2 98 %. Physical Exam  Constitutional: He is oriented to person, place, and time. He appears well-developed and well-nourished.  HENT:  Head: Normocephalic and atraumatic.  Mouth/Throat: No oropharyngeal exudate.  Eyes: Conjunctivae and EOM are normal. Pupils are equal, round, and reactive to light. No scleral icterus.  Neck: Normal range of motion. Neck supple. No JVD present. No tracheal deviation present. No thyromegaly present.  Cardiovascular: Normal rate and regular rhythm.  Exam reveals no gallop and no friction rub.   No murmur heard. Respiratory: Effort normal and breath sounds normal. No respiratory distress. He has no wheezes. He has no rales.  GI: Soft. Bowel sounds are normal. He exhibits no distension. There is no tenderness. There is no rebound and no guarding.  Musculoskeletal: Normal range of motion. He exhibits no edema or tenderness.  Lymphadenopathy:    He has no cervical adenopathy.  Neurological: He is alert and oriented to person, place, and time. He has normal reflexes. He displays normal reflexes. No cranial nerve deficit. He exhibits normal muscle tone. Coordination normal.   Skin: Skin is warm and dry. No rash noted. No erythema. No pallor.  Psychiatric: He has a normal mood and affect. His behavior is normal. Judgment and thought content normal.     Assessment/Plan Bleeding, Thrombocytopenia Check LDH,  Transfuse with plt  ESRD on HD Check cmp in am  Anemia Check cbc in am  Pancytopenia Check cbc in am  DVT prophylaxis:  SCD  Velena Keegan 12/02/2014, 1:21 AM

## 2014-12-02 NOTE — Progress Notes (Signed)
Patient discharged with instructions, prescription, and care notes.  Verbalized understanding via teach back.  IV was removed and the patient began to bleed profuse from the site.  A pressure dressing was placed held for 2-5 minutes, and the site stopped bleeding and the site was WNL. Asked the patient if he wanted a dressing placed over the site and he declined. Patient voiced no further complaints or concerns at the time of discharge.  Appointments scheduled per instructions.  Patient left the floor via w/c with staff and family in stable condition.   Throughout the day since the beginning of the shift the patient has been very disgruntled.  He has complained about the care all day.  I have tried to meet his needs throughout the day by rounding on him, sitting with him, keeping him informed and working with him best I could.  The patient voiced no further complaints at discharge.

## 2014-12-02 NOTE — Discharge Summary (Signed)
Physician Discharge Summary  [name] WKM:[id number] DOB: 08-20-1945 DOA: 12/01/2014  PCP: [name], MD  Admit date: 12/01/2014 Discharge date: 12/02/2014  Time spent: > 35 minutes  Recommendations for Outpatient Follow-up:  1. Follow up with Robynn Pane, PA in 3 days as scheduled  Discharge Diagnoses:  Active Problems:   Essential hypertension, benign   Multiple myeloma   Thrombocytopenia   Bleeding  Discharge Condition: stable  Diet recommendation: regular  Filed Weights   12/01/14 2222 12/02/14 0148  Weight: 108.863 kg (240 lb) 108.863 kg (240 lb)    History of present illness:  Per Dr. Maudie Mercury, 69 yo male with ESRD on HD, apparently c/o bleeding startnig about 4 hours after dialysis. Had bleeding where the needle was for his dialysis. Pt is doing better with pressure and ED requested that he be admitted for thrombocytopenia.   Hospital Course:  Patient was admitted to the hospital due to a small bleed were the dialysis needle was placed. Because of his thrombocytopenia, platelets were ordered for transfusions and patient was admitted to the [place] floor. Patient's bleeding has stopped, he was transfused 2 units of platelets without events, and he was discharged home in stable condition to follow-up with oncology as an outpatient. Patient had no other complaints, his vital signs were stable, and the rest of his blood work was fairly unremarkable for him. He already has an appointment in about 3 days.  Procedures:  None   Consultations:  None   Discharge Exam: Filed Vitals:   12/02/14 1123 12/02/14 1415 12/02/14 1445 12/02/14 1606  BP: 148/73 162/72 146/64 130/67  Pulse: 54 54 53 68  Temp: 98.3 F (36.8 C) 97.7 F (36.5 C) 98 F (36.7 C) 98.1 F (36.7 C)  TempSrc: Oral Oral Oral Oral  Resp: 20 18 20 18   Height:      Weight:      SpO2:        General: NAD Cardiovascular: RRR Respiratory: CTA biL  Discharge Instructions       Medication List    TAKE these medications        albuterol (2.5 MG/3ML) 0.083% nebulizer solution  Commonly known as:  PROVENTIL  Take 3 mLs (2.5 mg total) by nebulization every 6 (six) hours as needed for wheezing or shortness of breath.     calcium acetate 667 MG capsule  Commonly known as:  PHOSLO  Take 2,001 mg by mouth 3 (three) times daily with meals.     chlorpheniramine-HYDROcodone 10-8 MG/5ML Suer  Commonly known as:  TUSSIONEX PENNKINETIC ER  Take 5 mLs by mouth every 12 (twelve) hours as needed for cough.     docusate sodium 100 MG capsule  Commonly known as:  COLACE  Take 100 mg by mouth daily.     Fish Oil 1000 MG Caps  Take 2 capsules by mouth daily.     furosemide 20 MG tablet  Commonly known as:  LASIX  Take 40 mg by mouth daily.     hydrocortisone cream 0.5 %  Apply 1 application topically 2 (two) times daily.     metoprolol succinate 25 MG 24 hr tablet  Commonly known as:  TOPROL-XL  Take 25 mg by mouth daily.     oxyCODONE-acetaminophen 5-325 MG per tablet  Commonly known as:  ROXICET  Take 1 tablet by mouth every 6 (six) hours as needed for severe pain.     pregabalin 75 MG capsule  Commonly known as:  LYRICA  Take 1 capsule (75 mg total) by mouth 2 (two) times daily.           Follow-up Information    Follow up with KEFALAS,THOMAS, PA-C In 3 days.   Specialty:  Physician Assistant   Why:  as scheduled   Contact information:   [place] [address] [phone]       The results of significant diagnostics from this hospitalization (including imaging, microbiology, ancillary and laboratory) are listed below for reference.    Labs: Basic Metabolic Panel:  Recent Labs Lab 12/01/14 2319  NA 140  K 4.1  CL 100*  CO2 30  GLUCOSE 109*  BUN 22*  CREATININE 3.92*  CALCIUM 8.8*   CBC:  Recent Labs Lab 12/01/14 2319  WBC 2.0*  NEUTROABS 0.5*  HGB 8.8*  HCT 27.0*  MCV 107.6*  PLT 14*   BNP: BNP (last 3  results)  Recent Labs  06/22/14 2359  BNP 2068.0*     Signed:  Marzetta Board  [facility] 12/02/2014, 5:35 PM

## 2014-12-03 DIAGNOSIS — C9000 Multiple myeloma not having achieved remission: Secondary | ICD-10-CM | POA: Diagnosis not present

## 2014-12-03 LAB — PREPARE PLATELET PHERESIS
UNIT DIVISION: 0
Unit division: 0
Unit division: 0
Unit division: 0

## 2014-12-05 ENCOUNTER — Other Ambulatory Visit (HOSPITAL_COMMUNITY): Payer: Self-pay

## 2014-12-06 ENCOUNTER — Other Ambulatory Visit (HOSPITAL_COMMUNITY): Payer: Self-pay | Admitting: Oncology

## 2014-12-06 DIAGNOSIS — C9000 Multiple myeloma not having achieved remission: Secondary | ICD-10-CM

## 2014-12-06 MED ORDER — LENALIDOMIDE 5 MG PO CAPS: ORAL_CAPSULE | ORAL | Status: DC

## 2014-12-11 ENCOUNTER — Other Ambulatory Visit (HOSPITAL_COMMUNITY): Payer: Self-pay

## 2014-12-19 ENCOUNTER — Encounter (HOSPITAL_COMMUNITY): Payer: Self-pay | Admitting: Oncology

## 2014-12-19 ENCOUNTER — Other Ambulatory Visit (HOSPITAL_COMMUNITY): Payer: Self-pay

## 2014-12-19 ENCOUNTER — Encounter (HOSPITAL_COMMUNITY): Payer: Medicare Other | Attending: Oncology | Admitting: Oncology

## 2014-12-19 ENCOUNTER — Encounter (HOSPITAL_BASED_OUTPATIENT_CLINIC_OR_DEPARTMENT_OTHER): Payer: Medicare Other

## 2014-12-19 VITALS — BP 128/67 | HR 56 | Temp 98.7°F | Resp 20 | Wt 247.5 lb

## 2014-12-19 DIAGNOSIS — C9000 Multiple myeloma not having achieved remission: Secondary | ICD-10-CM

## 2014-12-19 LAB — CBC
HCT: 27.5 % — ABNORMAL LOW (ref 39.0–52.0)
HEMOGLOBIN: 9 g/dL — AB (ref 13.0–17.0)
MCH: 35.4 pg — ABNORMAL HIGH (ref 26.0–34.0)
MCHC: 32.7 g/dL (ref 30.0–36.0)
MCV: 108.3 fL — ABNORMAL HIGH (ref 78.0–100.0)
Platelets: 17 10*3/uL — CL (ref 150–400)
RBC: 2.54 MIL/uL — ABNORMAL LOW (ref 4.22–5.81)
RDW: 17.9 % — AB (ref 11.5–15.5)
WBC: 2.1 10*3/uL — ABNORMAL LOW (ref 4.0–10.5)

## 2014-12-19 LAB — GLUCOSE, RANDOM: Glucose, Bld: 113 mg/dL — ABNORMAL HIGH (ref 65–99)

## 2014-12-19 NOTE — Progress Notes (Signed)
Added prepare platelets order

## 2014-12-19 NOTE — Assessment & Plan Note (Addendum)
Multiple myeloma, S/P 2 autologous BM transplant at [facility] under the guidance of Dr. Marcell Anger (retired) with the second bone marrow transplant occuring in July 2013. Recent bone marrow demonstrates 5% plasma cells and he was seen by Dr. Norma Fredrickson who recommended starting Revlimid at a dose of 5 mg 21/28 days. His Revlimid dosing has been complicated by pruritic rash and [name] has decided to take Revlimid every other day which he is tolerating well.  Will try to increase the dose in the future.  Labs today: CBC   Labs in 2 weeks: CBC diff, CMET, LDH, ESR, CRP, B2M, MM panel.  Labs in 4 weeks: CBC.  Return in 4 weeks for follow-up.    He is resistant to Revlimid given patient reported tolerability issues.  He is given the option to discontinue Revlimid, but after education regarding Revlimid and its role in his care, he wants to continue for now.  He tries to compare Revlimid to Kyprolis which cannot be done regards to dosing and schedule.    Platelet count is 17,000 and therefore we will give 1 unit of platelets on Friday, due to dialysis.    We will check to see if glucose can be added to his labs today.

## 2014-12-19 NOTE — Progress Notes (Signed)
Gregory Kilts, MD [place] [address]  Multiple myeloma - Plan: omega-3 acid ethyl esters (LOVAZA) 1 G capsule, CBC with Differential, Comprehensive metabolic panel, Lactate dehydrogenase, Sedimentation rate, C-reactive protein, Beta 2 microglobuline, serum, Multiple myeloma panel, serum, Kappa/lambda light chains, CBC with Differential, Practitioner attestation of consent, Complete patient signature process for consent form, Care order/instruction, 0.9 %  sodium chloride infusion, sodium chloride 0.9 % injection 10 mL, heparin lock flush 100 unit/mL, sodium chloride 0.9 % injection 3 mL, Prepare Pheresed Platelets, Transfuse Pheresed Platelets, acetaminophen (TYLENOL) tablet 650 mg, diphenhydrAMINE (BENADRYL) capsule 25 mg  CURRENT THERAPY: Revlimid 5 mg every other day  INTERVAL HISTORY: [name] 69 y.o. male returns for followup of multiple myeloma, S/P 2 autologous BM transplant at [facility] under the guidance of Dr. Marcell Anger (retired) with the second bone marrow transplant occuring in July 2013. Recent bone marrow demonstrates 5% plasma cells and he was seen by Dr. Norma Fredrickson who recommended starting Revlimid at a dose of 5 mg 21/28 days. His Revlimid dosing has been complicated by pruritic rash and [name] has decided to take Revlimid every other day which he is tolerating well.  Will try to increase the dose in the future.    Multiple myeloma   01/26/2011 Initial Diagnosis Multiple myeloma   09/10/2014 - 09/14/2014 Chemotherapy Revlimid 5 mg days 1-21 every 28 days   09/14/2014 Adverse Reaction Rash, Revlimid versus Doxycycline   09/21/2014 - 09/23/2014 Chemotherapy Revlimid 5 mg days 1-21 every 28 days- rechallenge.   09/24/2014 Adverse Reaction Rash.  Revlimid-induced   10/22/2014 -  Chemotherapy Revlimid 5 mg every other day.     He is tolerating Revlimid well without any complaints at this time.  He continues with every other day dosing.  In the future, I  will try to convince the patient to re-attempt a dose increased to daily x 21 days every 28 days.  [name] is convinced that he feels fatigue and tired from Revlimid.  I explained the role of Revlimid and reviewed his bone marrow biopsy results with him.  2% plasma cells is an indication of impending MM relapse and the role of treatment is to provide progression free survival.  His fatigue is certainly multifactorial.  He asks about a port for blood tests and infusions.  At this time, I would not recommend given his thrombocytopenia and his good IV access.  Past Medical History  Diagnosis Date  . Hypertension   . Ruptured cervical disc   . Fall resulting in striking against other object     paralyzed  . Multiple myeloma   . 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 Multiple myeloma 01/26/2011  . Unspecified vitamin D deficiency 03/06/2013  . Anemia of chronic disease 12/30/2013  . Chronic renal disease, stage 5, glomerular filtration rate less than or equal to 15 mL/min/1.73 square meter 12/30/2013  . Constipation   . Dialysis patient 2016  . Thrombocytopenia 08/31/2014    has Essential hypertension, benign; BRADYCARDIA; Multiple myeloma; Intravenous pamidronate causing adverse effect in therapeutic use; Unspecified vitamin D deficiency; Lymphedema of lower extremity; Anemia of chronic disease; Chronic renal disease, stage 5, glomerular filtration rate less than or equal to 15 mL/min/1.73 square meter; Pathologic fracture; Anemia in chronic kidney disease; Thrombocytopenia; and Bleeding on his problem list.     is allergic to pamidronate.  Current Outpatient Prescriptions on File Prior to Visit  Medication Sig  Dispense Refill  . calcium acetate (PHOSLO) 667 MG capsule Take 2,001 mg by mouth 3 (three) times daily with meals.    . docusate sodium (COLACE) 100 MG capsule Take 100 mg by mouth daily.     . furosemide (LASIX) 20 MG tablet Take 40 mg by mouth daily.      Marland Kitchen lenalidomide (REVLIMID) 5 MG capsule Take one tablet every other day, after dialysis 14 capsule 0  . Omega-3 Fatty Acids (FISH OIL) 1000 MG CAPS Take 2 capsules by mouth daily.    . pregabalin (LYRICA) 75 MG capsule Take 1 capsule (75 mg total) by mouth 2 (two) times daily. 60 capsule 5  . albuterol (PROVENTIL) (2.5 MG/3ML) 0.083% nebulizer solution Take 3 mLs (2.5 mg total) by nebulization every 6 (six) hours as needed for wheezing or shortness of breath. (Patient not taking: Reported on 12/19/2014) 20 vial 1  . chlorpheniramine-HYDROcodone (TUSSIONEX PENNKINETIC ER) 10-8 MG/5ML SUER Take 5 mLs by mouth every 12 (twelve) hours as needed for cough. (Patient not taking: Reported on 10/05/2014) 100 mL 0  . hydrocortisone cream 0.5 % Apply 1 application topically 2 (two) times daily. (Patient not taking: Reported on 09/21/2014) 30 g 0  . metoprolol succinate (TOPROL-XL) 25 MG 24 hr tablet Take 25 mg by mouth daily.    Marland Kitchen oxyCODONE-acetaminophen (ROXICET) 5-325 MG per tablet Take 1 tablet by mouth every 6 (six) hours as needed for severe pain. (Patient not taking: Reported on 11/21/2014) 15 tablet 0   No current facility-administered medications on file prior to visit.    Past Surgical History  Procedure Laterality Date  . Inguinal hernia repair Bilateral   . Anterior cervical decomp/discectomy fusion    . Bone marrow aspirate and biopsy wiith lumbar puncture    . Melanoma excision      rt. breast  . Cervical spine surgery    . Kyphoplasty Bilateral 01/30/2014    Procedure: Thoracic eleven Kyphoplasty;  Surgeon: Consuella Lose, MD;  Location: [facility];  Service: Neurosurgery;  Laterality: Bilateral;  Thoracic eleven Kyphoplasty  . Av fistula placement Right 04/30/2014    Procedure: BRACHIOCEPHALIC ARTERIOVENOUS (AV) FISTULA CREATION;  Surgeon: Conrad Littlefield, MD;  Location: [place];  Service: Vascular;  Laterality: Right;    Denies any headaches, dizziness, double vision, fevers, chills, night  sweats, nausea, vomiting, diarrhea, constipation, chest pain, heart palpitations, shortness of breath, blood in stool, black tarry stool, urinary pain, urinary burning, urinary frequency, hematuria.   PHYSICAL EXAMINATION  ECOG PERFORMANCE STATUS: 1 - Symptomatic but completely ambulatory  Filed Vitals:   12/19/14 1000  BP: 128/67  Pulse: 56  Temp: 98.7 F (37.1 C)  Resp: 20    GENERAL:alert, no distress, well nourished, well developed, comfortable, cooperative, obese and smiling SKIN: skin color, texture, turgor are normal, no rashes or significant lesions HEAD: Normocephalic, No masses, lesions, tenderness or abnormalities EYES: normal, PERRLA, EOMI, Conjunctiva are pink and non-injected EARS: External ears normal OROPHARYNX:lips, buccal mucosa, and tongue normal and mucous membranes are moist  NECK: supple, trachea midline LYMPH:  not examined BREAST:not examined ABDOMEN:obese BACK: Back symmetric, no curvature. EXTREMITIES:less then 2 second capillary refill, no joint deformities, effusion, or inflammation, no skin discoloration  NEURO: alert & oriented x 3 with fluent speech, no focal motor/sensory deficits, gait normal   LABORATORY DATA: CBC    Component Value Date/Time   WBC 2.1* 12/19/2014 0935   RBC 2.54* 12/19/2014 0935   HGB 9.0* 12/19/2014 0935   HCT 27.5*  12/19/2014 0935   PLT 17* 12/19/2014 0935   MCV 108.3* 12/19/2014 0935   MCH 35.4* 12/19/2014 0935   MCHC 32.7 12/19/2014 0935   RDW 17.9* 12/19/2014 0935   LYMPHSABS 1.2 12/01/2014 2319   MONOABS 0.1 12/01/2014 2319   EOSABS 0.1 12/01/2014 2319   BASOSABS 0.0 12/01/2014 2319      Chemistry      Component Value Date/Time   NA 140 12/01/2014 2319   K 4.1 12/01/2014 2319   CL 100* 12/01/2014 2319   CO2 30 12/01/2014 2319   BUN 22* 12/01/2014 2319   CREATININE 3.92* 12/01/2014 2319   CREATININE 43.09* 01/26/2012 1052      Component Value Date/Time   CALCIUM 8.8* 12/01/2014 2319   ALKPHOS 40  11/21/2014 1217   AST 18 11/21/2014 1217   ALT 15* 11/21/2014 1217   BILITOT 0.8 11/21/2014 1217        PENDING LABS:   RADIOGRAPHIC STUDIES:  No results found.   PATHOLOGY:    ASSESSMENT AND PLAN:  Multiple myeloma Multiple myeloma, S/P 2 autologous BM transplant at [facility] under the guidance of Dr. Marcell Anger (retired) with the second bone marrow transplant occuring in July 2013. Recent bone marrow demonstrates 5% plasma cells and he was seen by Dr. Norma Fredrickson who recommended starting Revlimid at a dose of 5 mg 21/28 days. His Revlimid dosing has been complicated by pruritic rash and Vertis has decided to take Revlimid every other day which he is tolerating well.  Will try to increase the dose in the future.  Labs today: CBC   Labs in 2 weeks: CBC diff, CMET, LDH, ESR, CRP, B2M, MM panel.  Labs in 4 weeks: CBC.  Return in 4 weeks for follow-up.    He is resistant to Revlimid given patient reported tolerability issues.  He is given the option to discontinue Revlimid, but after education regarding Revlimid and its role in his care, he wants to continue for now.  He tries to compare Revlimid to Kyprolis which cannot be done regards to dosing and schedule.    Platelet count is 17,000 and therefore we will give 1 unit of platelets on Friday, due to dialysis.    We will check to see if glucose can be added to his labs today.   THERAPY PLAN:  Continue Revlimid every other day.  Will try to increase dose to 21/28 days in the future.  All questions were answered. The patient knows to call the clinic with any problems, questions or concerns. We can certainly see the patient much sooner if necessary.  30 minutes spent in face to face discussion with the patient with an additional 10 minutes in documenting.  Patient and plan discussed with Dr. Ancil Linsey and she is in agreement with the aforementioned.   This note is electronically signed by: Doy Mince 12/19/2014 11:37  AM

## 2014-12-19 NOTE — Progress Notes (Signed)
LABS DRAWN

## 2014-12-19 NOTE — Addendum Note (Signed)
Addended by: Jerald Kief on: 12/19/2014 12:43 PM   Modules accepted: Orders

## 2014-12-19 NOTE — Patient Instructions (Signed)
[place] at [facility] Discharge Instructions  RECOMMENDATIONS MADE BY THE CONSULTANT AND ANY TEST RESULTS WILL BE SENT TO YOUR REFERRING PHYSICIAN.  Exam and discussion by Robynn Pane, PA-C Continue revlimid Will give you platelets on Friday - need to be here at 10:15am Call with any concerns Labs in 2 weeks and 4 weeks Office visit in 4 weeks.  Thank you for choosing [place] at [facility] to provide your oncology and hematology care.  To afford each patient quality time with our provider, please arrive at least 15 minutes before your scheduled appointment time.    You need to re-schedule your appointment should you arrive 10 or more minutes late.  We strive to give you quality time with our providers, and arriving late affects you and other patients whose appointments are after yours.  Also, if you no show three or more times for appointments you may be dismissed from the clinic at the providers discretion.     Again, thank you for choosing [facility].  Our hope is that these requests will decrease the amount of time that you wait before being seen by our physicians.       _____________________________________________________________  Should you have questions after your visit to [facility], please contact our office at [phone] between the hours of 8:30 a.m. and 4:30 p.m.  Voicemails left after 4:30 p.m. will not be returned until the following business day.  For prescription refill requests, have your pharmacy contact our office.

## 2014-12-21 ENCOUNTER — Encounter (HOSPITAL_BASED_OUTPATIENT_CLINIC_OR_DEPARTMENT_OTHER): Payer: Medicare Other

## 2014-12-21 VITALS — BP 115/59 | HR 55 | Temp 98.0°F | Resp 18

## 2014-12-21 DIAGNOSIS — D649 Anemia, unspecified: Secondary | ICD-10-CM

## 2014-12-21 DIAGNOSIS — C9000 Multiple myeloma not having achieved remission: Secondary | ICD-10-CM

## 2014-12-21 MED ORDER — ACETAMINOPHEN 325 MG PO TABS
ORAL_TABLET | ORAL | Status: AC
  Filled 2014-12-21: qty 2

## 2014-12-21 MED ORDER — ACETAMINOPHEN 325 MG PO TABS
650.0000 mg | ORAL_TABLET | Freq: Once | ORAL | Status: AC
  Administered 2014-12-21: 650 mg via ORAL

## 2014-12-21 MED ORDER — DIPHENHYDRAMINE HCL 25 MG PO CAPS
25.0000 mg | ORAL_CAPSULE | Freq: Once | ORAL | Status: AC
  Administered 2014-12-21: 25 mg via ORAL

## 2014-12-21 MED ORDER — SODIUM CHLORIDE 0.9 % IJ SOLN
10.0000 mL | INTRAMUSCULAR | Status: AC | PRN
  Administered 2014-12-21: 10 mL

## 2014-12-21 MED ORDER — SODIUM CHLORIDE 0.9 % IV SOLN
250.0000 mL | Freq: Once | INTRAVENOUS | Status: AC
  Administered 2014-12-21: 250 mL via INTRAVENOUS

## 2014-12-21 MED ORDER — DIPHENHYDRAMINE HCL 25 MG PO CAPS
ORAL_CAPSULE | ORAL | Status: AC
  Filled 2014-12-21: qty 1

## 2014-12-21 NOTE — Progress Notes (Signed)
Tolerated platelet transfusion well. Ambulatory on discharge home to self.

## 2014-12-21 NOTE — Patient Instructions (Signed)
[place] at [facility] Discharge Instructions  RECOMMENDATIONS MADE BY THE CONSULTANT AND ANY TEST RESULTS WILL BE SENT TO YOUR REFERRING PHYSICIAN.  Today you received 1 unit platelet transfusion. Return as scheduled.  Thank you for choosing [place] at [facility] to provide your oncology and hematology care.  To afford each patient quality time with our provider, please arrive at least 15 minutes before your scheduled appointment time.    You need to re-schedule your appointment should you arrive 10 or more minutes late.  We strive to give you quality time with our providers, and arriving late affects you and other patients whose appointments are after yours.  Also, if you no show three or more times for appointments you may be dismissed from the clinic at the providers discretion.     Again, thank you for choosing [facility].  Our hope is that these requests will decrease the amount of time that you wait before being seen by our physicians.       _____________________________________________________________  Should you have questions after your visit to [facility], please contact our office at [phone] between the hours of 8:30 a.m. and 4:30 p.m.  Voicemails left after 4:30 p.m. will not be returned until the following business day.  For prescription refill requests, have your pharmacy contact our office.

## 2014-12-23 LAB — PREPARE PLATELET PHERESIS: Unit division: 0

## 2015-01-02 ENCOUNTER — Telehealth (HOSPITAL_COMMUNITY): Payer: Self-pay | Admitting: *Deleted

## 2015-01-02 ENCOUNTER — Encounter (HOSPITAL_BASED_OUTPATIENT_CLINIC_OR_DEPARTMENT_OTHER): Payer: Medicare Other

## 2015-01-02 ENCOUNTER — Encounter (HOSPITAL_COMMUNITY): Payer: Self-pay

## 2015-01-02 ENCOUNTER — Other Ambulatory Visit (HOSPITAL_COMMUNITY): Payer: Self-pay

## 2015-01-02 ENCOUNTER — Encounter (HOSPITAL_COMMUNITY): Payer: Medicare Other

## 2015-01-02 DIAGNOSIS — C9000 Multiple myeloma not having achieved remission: Secondary | ICD-10-CM

## 2015-01-02 DIAGNOSIS — D649 Anemia, unspecified: Secondary | ICD-10-CM

## 2015-01-02 LAB — CBC WITH DIFFERENTIAL/PLATELET
Basophils Absolute: 0 10*3/uL (ref 0.0–0.1)
Basophils Relative: 0 % (ref 0–1)
EOS PCT: 5 % (ref 0–5)
Eosinophils Absolute: 0.1 10*3/uL (ref 0.0–0.7)
HEMATOCRIT: 29.9 % — AB (ref 39.0–52.0)
Hemoglobin: 9.7 g/dL — ABNORMAL LOW (ref 13.0–17.0)
LYMPHS ABS: 1.7 10*3/uL (ref 0.7–4.0)
LYMPHS PCT: 69 % — AB (ref 12–46)
MCH: 35.7 pg — ABNORMAL HIGH (ref 26.0–34.0)
MCHC: 32.4 g/dL (ref 30.0–36.0)
MCV: 109.9 fL — AB (ref 78.0–100.0)
MONO ABS: 0.1 10*3/uL (ref 0.1–1.0)
Monocytes Relative: 5 % (ref 3–12)
Neutro Abs: 0.5 10*3/uL — ABNORMAL LOW (ref 1.7–7.7)
Neutrophils Relative %: 20 % — ABNORMAL LOW (ref 43–77)
Platelets: 18 10*3/uL — CL (ref 150–400)
RBC: 2.72 MIL/uL — ABNORMAL LOW (ref 4.22–5.81)
RDW: 17.6 % — AB (ref 11.5–15.5)
Smear Review: DECREASED
WBC: 2.5 10*3/uL — ABNORMAL LOW (ref 4.0–10.5)

## 2015-01-02 LAB — COMPREHENSIVE METABOLIC PANEL
ALBUMIN: 3.6 g/dL (ref 3.5–5.0)
ALK PHOS: 34 U/L — AB (ref 38–126)
ALT: 16 U/L — AB (ref 17–63)
ANION GAP: 10 (ref 5–15)
AST: 20 U/L (ref 15–41)
BILIRUBIN TOTAL: 1 mg/dL (ref 0.3–1.2)
BUN: 35 mg/dL — AB (ref 6–20)
CALCIUM: 9.2 mg/dL (ref 8.9–10.3)
CO2: 29 mmol/L (ref 22–32)
Chloride: 97 mmol/L — ABNORMAL LOW (ref 101–111)
Creatinine, Ser: 6.13 mg/dL — ABNORMAL HIGH (ref 0.61–1.24)
GFR calc Af Amer: 10 mL/min — ABNORMAL LOW (ref >60)
GFR calc non Af Amer: 8 mL/min — ABNORMAL LOW (ref >60)
GLUCOSE: 101 mg/dL — AB (ref 65–99)
Potassium: 4.5 mmol/L (ref 3.5–5.1)
SODIUM: 136 mmol/L (ref 135–145)
Total Protein: 6.6 g/dL (ref 6.5–8.1)

## 2015-01-02 LAB — LACTATE DEHYDROGENASE: LDH: 154 U/L (ref 98–192)

## 2015-01-02 LAB — SEDIMENTATION RATE: Sed Rate: 15 mm/hr (ref 0–16)

## 2015-01-02 LAB — C-REACTIVE PROTEIN: CRP: 0.7 mg/dL (ref <1.0)

## 2015-01-02 MED ORDER — ACETAMINOPHEN 325 MG PO TABS
650.0000 mg | ORAL_TABLET | Freq: Once | ORAL | Status: AC
  Administered 2015-01-02: 650 mg via ORAL

## 2015-01-02 MED ORDER — SODIUM CHLORIDE 0.9 % IJ SOLN: 10.0000 mL | INTRAMUSCULAR | Status: DC | PRN

## 2015-01-02 MED ORDER — DIPHENHYDRAMINE HCL 25 MG PO CAPS
ORAL_CAPSULE | ORAL | Status: AC
  Filled 2015-01-02: qty 1

## 2015-01-02 MED ORDER — SODIUM CHLORIDE 0.9 % IV SOLN: 250.0000 mL | Freq: Once | INTRAVENOUS | Status: DC

## 2015-01-02 MED ORDER — ACETAMINOPHEN 325 MG PO TABS
ORAL_TABLET | ORAL | Status: AC
  Filled 2015-01-02: qty 2

## 2015-01-02 MED ORDER — DIPHENHYDRAMINE HCL 25 MG PO CAPS
25.0000 mg | ORAL_CAPSULE | Freq: Once | ORAL | Status: AC
  Administered 2015-01-02: 25 mg via ORAL

## 2015-01-02 NOTE — Patient Instructions (Signed)
[place] at [facility] Discharge Instructions  RECOMMENDATIONS MADE BY THE CONSULTANT AND ANY TEST RESULTS WILL BE SENT TO YOUR REFERRING PHYSICIAN.  Platelet infusion today. Return as scheduled for lab work and office visit.  Thank you for choosing [place] at [facility] to provide your oncology and hematology care.  To afford each patient quality time with our provider, please arrive at least 15 minutes before your scheduled appointment time.    You need to re-schedule your appointment should you arrive 10 or more minutes late.  We strive to give you quality time with our providers, and arriving late affects you and other patients whose appointments are after yours.  Also, if you no show three or more times for appointments you may be dismissed from the clinic at the providers discretion.     Again, thank you for choosing [facility].  Our hope is that these requests will decrease the amount of time that you wait before being seen by our physicians.       _____________________________________________________________  Should you have questions after your visit to [facility], please contact our office at [phone] between the hours of 8:30 a.m. and 4:30 p.m.  Voicemails left after 4:30 p.m. will not be returned until the following business day.  For prescription refill requests, have your pharmacy contact our office.

## 2015-01-02 NOTE — Progress Notes (Signed)
1400:  Tolerated platelet infusion w/o adverse reaction. A&Ox4.  VSS.  Discharged ambulatory.

## 2015-01-02 NOTE — Telephone Encounter (Signed)
..  CRITICAL VALUE ALERT Critical value received:  Platelets 18,000 Date of notification:  01/02/2015 Time of notification: 1032 Critical value read back:  Yes.   Nurse who received alert:  TAR MD notified (1st page):  Dr. Whitney Muse

## 2015-01-03 ENCOUNTER — Other Ambulatory Visit (HOSPITAL_COMMUNITY): Payer: Self-pay | Admitting: Oncology

## 2015-01-03 DIAGNOSIS — C9000 Multiple myeloma not having achieved remission: Secondary | ICD-10-CM

## 2015-01-03 DIAGNOSIS — D696 Thrombocytopenia, unspecified: Secondary | ICD-10-CM

## 2015-01-03 LAB — PREPARE PLATELET PHERESIS: UNIT DIVISION: 0

## 2015-01-03 LAB — KAPPA/LAMBDA LIGHT CHAINS
KAPPA FREE LGHT CHN: 248.09 mg/L — AB (ref 3.30–19.40)
KAPPA, LAMDA LIGHT CHAIN RATIO: 2.38 — AB (ref 0.26–1.65)
Lambda free light chains: 104.35 mg/L — ABNORMAL HIGH (ref 5.71–26.30)

## 2015-01-03 LAB — BETA 2 MICROGLOBULIN, SERUM: Beta-2 Microglobulin: 11.5 mg/L — ABNORMAL HIGH (ref 0.6–2.4)

## 2015-01-03 MED ORDER — LENALIDOMIDE 5 MG PO CAPS: ORAL_CAPSULE | ORAL | Status: DC

## 2015-01-04 LAB — MULTIPLE MYELOMA PANEL, SERUM
ALBUMIN SERPL ELPH-MCNC: 3.8 g/dL (ref 2.9–4.4)
ALBUMIN/GLOB SERPL: 1.6 (ref 0.7–1.7)
ALPHA 1: 0.2 g/dL (ref 0.0–0.4)
Alpha2 Glob SerPl Elph-Mcnc: 0.4 g/dL (ref 0.4–1.0)
B-GLOBULIN SERPL ELPH-MCNC: 0.9 g/dL (ref 0.7–1.3)
GAMMA GLOB SERPL ELPH-MCNC: 1 g/dL (ref 0.4–1.8)
GLOBULIN, TOTAL: 2.5 g/dL (ref 2.2–3.9)
IgA: 233 mg/dL (ref 61–437)
IgG (Immunoglobin G), Serum: 953 mg/dL (ref 700–1600)
IgM, Serum: 83 mg/dL (ref 20–172)
Total Protein ELP: 6.3 g/dL (ref 6.0–8.5)

## 2015-01-14 ENCOUNTER — Encounter (HOSPITAL_COMMUNITY): Payer: Medicare Other

## 2015-01-14 DIAGNOSIS — C9000 Multiple myeloma not having achieved remission: Secondary | ICD-10-CM

## 2015-01-14 LAB — CBC WITH DIFFERENTIAL/PLATELET
Basophils Absolute: 0 10*3/uL (ref 0.0–0.1)
Basophils Relative: 0 % (ref 0–1)
EOS ABS: 0.1 10*3/uL (ref 0.0–0.7)
EOS PCT: 4 % (ref 0–5)
HCT: 30.5 % — ABNORMAL LOW (ref 39.0–52.0)
Hemoglobin: 10 g/dL — ABNORMAL LOW (ref 13.0–17.0)
LYMPHS ABS: 1.7 10*3/uL (ref 0.7–4.0)
LYMPHS PCT: 66 % — AB (ref 12–46)
MCH: 36.4 pg — AB (ref 26.0–34.0)
MCHC: 32.8 g/dL (ref 30.0–36.0)
MCV: 110.9 fL — AB (ref 78.0–100.0)
MONO ABS: 0.2 10*3/uL (ref 0.1–1.0)
Monocytes Relative: 6 % (ref 3–12)
Neutro Abs: 0.6 10*3/uL — ABNORMAL LOW (ref 1.7–7.7)
Neutrophils Relative %: 24 % — ABNORMAL LOW (ref 43–77)
PLATELETS: 20 10*3/uL — AB (ref 150–400)
RBC: 2.75 MIL/uL — ABNORMAL LOW (ref 4.22–5.81)
RDW: 17.2 % — AB (ref 11.5–15.5)
WBC: 2.5 10*3/uL — ABNORMAL LOW (ref 4.0–10.5)

## 2015-01-14 NOTE — Progress Notes (Unsigned)
..  CRITICAL VALUE ALERT Critical value received:  Platelets-20,000 Date of notification:  01/14/15 Time of notification: 1898 Critical value read back:  Yes.   Nurse who received alert:  Josephina Shih, RN MD notified (1st page):  Robynn Pane, PA notified face-to-face, he requests to have the patient to return as scheduled for repeat labs this Friday, no additional orders.

## 2015-01-18 ENCOUNTER — Encounter (HOSPITAL_COMMUNITY): Payer: Self-pay | Admitting: Oncology

## 2015-01-18 ENCOUNTER — Encounter (HOSPITAL_BASED_OUTPATIENT_CLINIC_OR_DEPARTMENT_OTHER): Payer: Medicare Other

## 2015-01-18 ENCOUNTER — Telehealth (HOSPITAL_COMMUNITY): Payer: Self-pay

## 2015-01-18 ENCOUNTER — Encounter (HOSPITAL_COMMUNITY): Payer: Medicare Other | Attending: Oncology | Admitting: Oncology

## 2015-01-18 VITALS — BP 126/67 | HR 58 | Temp 98.5°F | Resp 20 | Wt 246.4 lb

## 2015-01-18 VITALS — BP 140/69 | HR 55 | Temp 98.0°F | Resp 16

## 2015-01-18 DIAGNOSIS — D696 Thrombocytopenia, unspecified: Secondary | ICD-10-CM

## 2015-01-18 DIAGNOSIS — C9000 Multiple myeloma not having achieved remission: Secondary | ICD-10-CM | POA: Diagnosis present

## 2015-01-18 LAB — CBC WITH DIFFERENTIAL/PLATELET
BASOS PCT: 0 % (ref 0–1)
Basophils Absolute: 0 10*3/uL (ref 0.0–0.1)
EOS ABS: 0.1 10*3/uL (ref 0.0–0.7)
EOS PCT: 3 % (ref 0–5)
HEMATOCRIT: 30.2 % — AB (ref 39.0–52.0)
Hemoglobin: 10 g/dL — ABNORMAL LOW (ref 13.0–17.0)
Lymphocytes Relative: 70 % — ABNORMAL HIGH (ref 12–46)
Lymphs Abs: 1.8 10*3/uL (ref 0.7–4.0)
MCH: 36.6 pg — AB (ref 26.0–34.0)
MCHC: 33.1 g/dL (ref 30.0–36.0)
MCV: 110.6 fL — AB (ref 78.0–100.0)
MONO ABS: 0.1 10*3/uL (ref 0.1–1.0)
MONOS PCT: 2 % — AB (ref 3–12)
Neutro Abs: 0.6 10*3/uL — ABNORMAL LOW (ref 1.7–7.7)
Neutrophils Relative %: 24 % — ABNORMAL LOW (ref 43–77)
PLATELETS: 18 10*3/uL — AB (ref 150–400)
RBC: 2.73 MIL/uL — ABNORMAL LOW (ref 4.22–5.81)
RDW: 17.1 % — AB (ref 11.5–15.5)
WBC: 2.6 10*3/uL — ABNORMAL LOW (ref 4.0–10.5)

## 2015-01-18 MED ORDER — SODIUM CHLORIDE 0.9 % IJ SOLN
10.0000 mL | INTRAMUSCULAR | Status: AC | PRN
  Administered 2015-01-18: 10 mL

## 2015-01-18 MED ORDER — SODIUM CHLORIDE 0.9 % IV SOLN
250.0000 mL | Freq: Once | INTRAVENOUS | Status: AC
  Administered 2015-01-18: 250 mL via INTRAVENOUS

## 2015-01-18 MED ORDER — DIPHENHYDRAMINE HCL 25 MG PO CAPS
25.0000 mg | ORAL_CAPSULE | Freq: Once | ORAL | Status: AC
  Administered 2015-01-18: 25 mg via ORAL

## 2015-01-18 MED ORDER — ACETAMINOPHEN 325 MG PO TABS
ORAL_TABLET | ORAL | Status: AC
  Filled 2015-01-18: qty 2

## 2015-01-18 MED ORDER — DIPHENHYDRAMINE HCL 25 MG PO CAPS
ORAL_CAPSULE | ORAL | Status: AC
  Filled 2015-01-18: qty 1

## 2015-01-18 MED ORDER — ACETAMINOPHEN 325 MG PO TABS
650.0000 mg | ORAL_TABLET | Freq: Once | ORAL | Status: AC
  Administered 2015-01-18: 650 mg via ORAL

## 2015-01-18 NOTE — Telephone Encounter (Signed)
CRITICAL VALUE ALERT Critical value received:  Platelets 18,000  Date of notification:  01/18/15  Time of notification: 3559  Critical value read back:  Yes.   Nurse who received alert:  Mickie Kay, RN Robynn Pane, PA-C notified at [phone]

## 2015-01-18 NOTE — Patient Instructions (Addendum)
..  [place] at [facility] Discharge Instructions  RECOMMENDATIONS MADE BY THE CONSULTANT AND ANY TEST RESULTS WILL BE SENT TO YOUR REFERRING PHYSICIAN.  Seen and discussion with Robynn Pane  PA-C. Call the cancer center with any questions and/or concerns that you have.   To receive Platelets today-they will be here in about 2 hours. Bone survey next week.Go by xray to get done.   Labs in 2 weeks. Labs in 4 weeks. Return in 4 weeks for follow up.       Thank you for choosing [place] at [facility] to provide your oncology and hematology care.  To afford each patient quality time with our provider, please arrive at least 15 minutes before your scheduled appointment time.    You need to re-schedule your appointment should you arrive 10 or more minutes late.  We strive to give you quality time with our providers, and arriving late affects you and other patients whose appointments are after yours.  Also, if you no show three or more times for appointments you may be dismissed from the clinic at the providers discretion.     Again, thank you for choosing [facility].  Our hope is that these requests will decrease the amount of time that you wait before being seen by our physicians.       _____________________________________________________________  Should you have questions after your visit to [facility], please contact our office at [phone] between the hours of 8:30 a.m. and 4:30 p.m.  Voicemails left after 4:30 p.m. will not be returned until the following business day.  For prescription refill requests, have your pharmacy contact our office.

## 2015-01-18 NOTE — Progress Notes (Signed)
Lab draw

## 2015-01-18 NOTE — Progress Notes (Signed)
Tolerated platelets infusion with s/s adverse reaction.

## 2015-01-18 NOTE — Progress Notes (Signed)
[name], MD [name] [id number]  Multiple myeloma - Plan: DG Bone Survey Met, Care order/instruction, 0.9 %  sodium chloride infusion, sodium chloride 0.9 % injection 10 mL, heparin lock flush 100 unit/mL, sodium chloride 0.9 % injection 3 mL, Prepare Pheresed Platelets, Transfuse Pheresed Platelets, acetaminophen (TYLENOL) tablet 650 mg, diphenhydrAMINE (BENADRYL) capsule 25 mg  Thrombocytopenia - Plan: Care order/instruction, 0.9 %  sodium chloride infusion, sodium chloride 0.9 % injection 10 mL, heparin lock flush 100 unit/mL, sodium chloride 0.9 % injection 3 mL, Prepare Pheresed Platelets, Transfuse Pheresed Platelets, acetaminophen (TYLENOL) tablet 650 mg, diphenhydrAMINE (BENADRYL) capsule 25 mg  CURRENT THERAPY: Revlimid 5 mg every other day  INTERVAL HISTORY: REMON QUINTO 69 y.o. male returns for followup of multiple myeloma, S/P 2 autologous BM transplant at [facility] under the guidance of Dr. Marcell Anger (retired) with the second bone marrow transplant occuring in July 2013. Recent bone marrow demonstrates 5% plasma cells and he was seen by Dr. Norma Fredrickson who recommended starting Revlimid at a dose of 5 mg 21/28 days. His Revlimid dosing has been complicated by pruritic rash and Bryndan has decided to take Revlimid every other day which he is tolerating well.  Will try to increase the dose in the future.    Multiple myeloma   01/26/2011 Initial Diagnosis Multiple myeloma   09/10/2014 - 09/14/2014 Chemotherapy Revlimid 5 mg days 1-21 every 28 days   09/14/2014 Adverse Reaction Rash, Revlimid versus Doxycycline   09/21/2014 - 09/23/2014 Chemotherapy Revlimid 5 mg days 1-21 every 28 days- rechallenge.   09/24/2014 Adverse Reaction Rash.  Revlimid-induced   10/22/2014 -  Chemotherapy Revlimid 5 mg every other day.     He is tolerating Revlimid well without any complaints at this time.  He continues with every other day dosing.  In the future, I will try to  convince the patient to re-attempt a dose increased to daily x 21 days every 28 days.  Barnell is convinced that he feels fatigue and tired from Revlimid.  I explained the role of Revlimid and reviewed his bone marrow biopsy results with him.  2% plasma cells is an indication of impending MM relapse and the role of treatment is to provide progression free survival.  His fatigue is certainly multifactorial with renal failure requiring hemodialysis, MM, MM-treatment, deconditioning, etc.  He asks about a port for blood tests and infusions.  At this time, I would not recommend given his thrombocytopenia and his good IV access.  He reports that he is doing well.  He questions about a renal transplant, which he is not a candidate for, but I prefer that discussion take place with his nephrologist.    He reminds me that he is overdue for a skeletal survey which we have been doing annually.  I will get that set-up for him.  Based upon his labs today, he will receive a platelet transfusion.  I personally reviewed and went over laboratory results with the patient.  The results are noted within this dictation.   Past Medical History  Diagnosis Date  . Hypertension   . Ruptured cervical disc   . Fall resulting in striking against other object     paralyzed  . Multiple myeloma   . 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 Multiple myeloma 01/26/2011  . Unspecified vitamin D deficiency 03/06/2013  . Anemia of chronic disease 12/30/2013  . Chronic renal disease, stage  5, glomerular filtration rate less than or equal to 15 mL/min/1.73 square meter 12/30/2013  . Constipation   . Dialysis patient 2016  . Thrombocytopenia 08/31/2014    has Essential hypertension, benign; BRADYCARDIA; Multiple myeloma; Intravenous pamidronate causing adverse effect in therapeutic use; Unspecified vitamin D deficiency; Lymphedema of lower extremity; Anemia of chronic disease; Chronic renal disease,  stage 5, glomerular filtration rate less than or equal to 15 mL/min/1.73 square meter; Pathologic fracture; Anemia in chronic kidney disease; Thrombocytopenia; and Bleeding on his problem list.     is allergic to pamidronate.  Current Outpatient Prescriptions on File Prior to Visit  Medication Sig Dispense Refill  . docusate sodium (COLACE) 100 MG capsule Take 100 mg by mouth daily.     . furosemide (LASIX) 20 MG tablet Take 40 mg by mouth daily.    . pregabalin (LYRICA) 75 MG capsule Take 1 capsule (75 mg total) by mouth 2 (two) times daily. 60 capsule 5  . albuterol (PROVENTIL) (2.5 MG/3ML) 0.083% nebulizer solution Take 3 mLs (2.5 mg total) by nebulization every 6 (six) hours as needed for wheezing or shortness of breath. (Patient not taking: Reported on 12/19/2014) 20 vial 1  . calcium acetate (PHOSLO) 667 MG capsule Take 2,001 mg by mouth 3 (three) times daily with meals.    . chlorpheniramine-HYDROcodone (TUSSIONEX PENNKINETIC ER) 10-8 MG/5ML SUER Take 5 mLs by mouth every 12 (twelve) hours as needed for cough. (Patient not taking: Reported on 10/05/2014) 100 mL 0  . hydrocortisone cream 0.5 % Apply 1 application topically 2 (two) times daily. (Patient not taking: Reported on 09/21/2014) 30 g 0  . lenalidomide (REVLIMID) 5 MG capsule Take one tablet every other day, after dialysis (Patient not taking: Reported on 01/18/2015) 14 capsule 0  . metoprolol succinate (TOPROL-XL) 25 MG 24 hr tablet Take 25 mg by mouth daily.    Marland Kitchen omega-3 acid ethyl esters (LOVAZA) 1 G capsule Take 1 g by mouth.    . Omega-3 Fatty Acids (FISH OIL) 1000 MG CAPS Take 2 capsules by mouth daily.    Marland Kitchen oxyCODONE-acetaminophen (ROXICET) 5-325 MG per tablet Take 1 tablet by mouth every 6 (six) hours as needed for severe pain. (Patient not taking: Reported on 11/21/2014) 15 tablet 0   No current facility-administered medications on file prior to visit.    Past Surgical History  Procedure Laterality Date  . Inguinal hernia repair  Bilateral   . Anterior cervical decomp/discectomy fusion    . Bone marrow aspirate and biopsy wiith lumbar puncture    . Melanoma excision      rt. breast  . Cervical spine surgery    . Kyphoplasty Bilateral 01/30/2014    Procedure: Thoracic eleven Kyphoplasty;  Surgeon: Consuella Lose, MD;  Location: [facility];  Service: Neurosurgery;  Laterality: Bilateral;  Thoracic eleven Kyphoplasty  . Av fistula placement Right 04/30/2014    Procedure: BRACHIOCEPHALIC ARTERIOVENOUS (AV) FISTULA CREATION;  Surgeon: Conrad Screven, MD;  Location: [place];  Service: Vascular;  Laterality: Right;    Denies any headaches, dizziness, double vision, fevers, chills, night sweats, nausea, vomiting, diarrhea, constipation, chest pain, heart palpitations, shortness of breath, blood in stool, black tarry stool, urinary pain, urinary burning, urinary frequency, hematuria.   PHYSICAL EXAMINATION  ECOG PERFORMANCE STATUS: 1 - Symptomatic but completely ambulatory  Filed Vitals:   01/18/15 1000  BP: 126/67  Pulse: 58  Temp: 98.5 F (36.9 C)  Resp: 20    GENERAL:alert, no distress, well nourished, well developed,  comfortable, cooperative, obese and smiling SKIN: skin color, texture, turgor are normal, no rashes or significant lesions HEAD: Normocephalic, No masses, lesions, tenderness or abnormalities EYES: normal, PERRLA, EOMI, Conjunctiva are pink and non-injected EARS: External ears normal OROPHARYNX:lips, buccal mucosa, and tongue normal and mucous membranes are moist  NECK: supple, trachea midline LYMPH:  not examined BREAST:not examined CARDIAC: RRR without murmur LUNGS: CTA B/L ABDOMEN:obese, + BS x 4 quadrants BACK: Back symmetric, no curvature. EXTREMITIES:less then 2 second capillary refill, no joint deformities, effusion, or inflammation, no skin discoloration  NEURO: alert & oriented x 3 with fluent speech, no focal motor/sensory deficits, gait normal   LABORATORY DATA: CBC      Component Value Date/Time   WBC 2.6* 01/18/2015 1000   RBC 2.73* 01/18/2015 1000   HGB 10.0* 01/18/2015 1000   HCT 30.2* 01/18/2015 1000   PLT 18* 01/18/2015 1000   MCV 110.6* 01/18/2015 1000   MCH 36.6* 01/18/2015 1000   MCHC 33.1 01/18/2015 1000   RDW 17.1* 01/18/2015 1000   LYMPHSABS 1.8 01/18/2015 1000   MONOABS 0.1 01/18/2015 1000   EOSABS 0.1 01/18/2015 1000   BASOSABS 0.0 01/18/2015 1000      Chemistry      Component Value Date/Time   NA 136 01/02/2015 0949   K 4.5 01/02/2015 0949   CL 97* 01/02/2015 0949   CO2 29 01/02/2015 0949   BUN 35* 01/02/2015 0949   CREATININE 6.13* 01/02/2015 0949   CREATININE 43.09* 01/26/2012 1052      Component Value Date/Time   CALCIUM 9.2 01/02/2015 0949   ALKPHOS 34* 01/02/2015 0949   AST 20 01/02/2015 0949   ALT 16* 01/02/2015 0949   BILITOT 1.0 01/02/2015 0949     Lab Results  Component Value Date   PROT 6.6 01/02/2015   ALBUMINELP 63.2 05/16/2014   A1GS 4.8 05/16/2014   A2GS 9.2 05/16/2014   BETS 5.8 05/16/2014   BETA2SER 4.1 05/16/2014   GAMS 12.9 05/16/2014   MSPIKE NOT DETECTED 05/16/2014   SPEI (NOTE) 05/16/2014   SPECOM (NOTE) 05/16/2014   IGGSERUM 953 01/02/2015   IGA 233 01/02/2015   IGMSERUM 83 01/02/2015   IMMELINT (NOTE) 05/16/2014   KPAFRELGTCHN 248.09* 01/02/2015   LAMBDASER 104.35* 01/02/2015   KAPLAMBRATIO 2.38* 01/02/2015    PENDING LABS:   RADIOGRAPHIC STUDIES:  No results found.   PATHOLOGY:    ASSESSMENT AND PLAN:  Multiple myeloma Multiple myeloma, S/P 2 autologous BM transplant at [facility] under the guidance of Dr. Marcell Anger (retired) with the second bone marrow transplant occuring in July 2013. Recent bone marrow demonstrates 5% plasma cells and he was seen by Dr. Norma Fredrickson who recommended starting Revlimid at a dose of 5 mg 21/28 days. His Revlimid dosing has been complicated by pruritic rash and Jove has decided to take Revlimid every other day which he is tolerating well.  Will try to  increase the dose in the future.  Labs today: CBC   Labs in 2 weeks: CBC diff, CMET, LDH, ESR, CRP, B2M, MM panel.  Labs in 4 weeks: CBC.  Return in 4 weeks for follow-up.    He is resistant to changes in Revlimid dosing given patient reported tolerability issues.  He will continue the same dose.  Platelet count was 20,000 the other day and therefore, we will repeat a CBC today.  If platelet count is less than 20,000, will set the patient up for pheresed platelets today or beginning of next week.  Today, his platelet count  is 18,000.  He will receive 1 unit of pheresed platelets today in the clinic (in about 2 hours).  He reminds me that he is overdue for his skeletal survey, which he is right.  We have been doing them annually.  Order placed and this will be done next Wednesday.   THERAPY PLAN:  Continue Revlimid every other day.  Will try to increase dose to 21/28 days in the future.  All questions were answered. The patient knows to call the clinic with any problems, questions or concerns. We can certainly see the patient much sooner if necessary.  Patient and plan discussed with Dr. Ancil Linsey and she is in agreement with the aforementioned.   This note is electronically signed by: Doy Mince 01/18/2015 11:17 AM

## 2015-01-18 NOTE — Assessment & Plan Note (Addendum)
Multiple myeloma, S/P 2 autologous BM transplant at [facility] under the guidance of Dr. Marcell Anger (retired) with the second bone marrow transplant occuring in July 2013. Recent bone marrow demonstrates 5% plasma cells and he was seen by Dr. Norma Fredrickson who recommended starting Revlimid at a dose of 5 mg 21/28 days. His Revlimid dosing has been complicated by pruritic rash and Bless has decided to take Revlimid every other day which he is tolerating well.  Will try to increase the dose in the future.  Labs today: CBC   Labs in 2 weeks: CBC diff, CMET, LDH, ESR, CRP, B2M, MM panel.  Labs in 4 weeks: CBC.  Return in 4 weeks for follow-up.    He is resistant to changes in Revlimid dosing given patient reported tolerability issues.  He will continue the same dose.  Platelet count was 20,000 the other day and therefore, we will repeat a CBC today.  If platelet count is less than 20,000, will set the patient up for pheresed platelets today or beginning of next week.  Today, his platelet count is 18,000.  He will receive 1 unit of pheresed platelets today in the clinic (in about 2 hours).  He reminds me that he is overdue for his skeletal survey, which he is right.  We have been doing them annually.  Order placed and this will be done next Wednesday.

## 2015-01-20 LAB — PREPARE PLATELET PHERESIS: UNIT DIVISION: 0

## 2015-01-23 ENCOUNTER — Ambulatory Visit (HOSPITAL_COMMUNITY)
Admission: RE | Admit: 2015-01-23 | Discharge: 2015-01-23 | Disposition: A | Discharge status: Home / Self Care | Payer: Medicare Other | Source: Ambulatory Visit | Attending: Oncology | Admitting: Oncology

## 2015-01-23 DIAGNOSIS — C9000 Multiple myeloma not having achieved remission: Secondary | ICD-10-CM | POA: Diagnosis present

## 2015-02-01 ENCOUNTER — Encounter (HOSPITAL_BASED_OUTPATIENT_CLINIC_OR_DEPARTMENT_OTHER): Payer: Medicare Other

## 2015-02-01 ENCOUNTER — Other Ambulatory Visit (HOSPITAL_COMMUNITY): Payer: Self-pay | Admitting: Oncology

## 2015-02-01 ENCOUNTER — Encounter (HOSPITAL_COMMUNITY): Payer: Self-pay

## 2015-02-01 ENCOUNTER — Telehealth (HOSPITAL_COMMUNITY): Payer: Self-pay | Admitting: Oncology

## 2015-02-01 VITALS — BP 138/76 | HR 50 | Temp 97.7°F | Resp 16

## 2015-02-01 DIAGNOSIS — C9000 Multiple myeloma not having achieved remission: Secondary | ICD-10-CM

## 2015-02-01 DIAGNOSIS — D696 Thrombocytopenia, unspecified: Secondary | ICD-10-CM

## 2015-02-01 LAB — COMPREHENSIVE METABOLIC PANEL
ALBUMIN: 3.4 g/dL — AB (ref 3.5–5.0)
ALT: 18 U/L (ref 17–63)
ANION GAP: 9 (ref 5–15)
AST: 21 U/L (ref 15–41)
Alkaline Phosphatase: 34 U/L — ABNORMAL LOW (ref 38–126)
BUN: 34 mg/dL — ABNORMAL HIGH (ref 6–20)
CO2: 32 mmol/L (ref 22–32)
Calcium: 8.6 mg/dL — ABNORMAL LOW (ref 8.9–10.3)
Chloride: 97 mmol/L — ABNORMAL LOW (ref 101–111)
Creatinine, Ser: 6.13 mg/dL — ABNORMAL HIGH (ref 0.61–1.24)
GFR calc non Af Amer: 8 mL/min — ABNORMAL LOW (ref >60)
GFR, EST AFRICAN AMERICAN: 10 mL/min — AB (ref >60)
GLUCOSE: 149 mg/dL — AB (ref 65–99)
POTASSIUM: 3.8 mmol/L (ref 3.5–5.1)
SODIUM: 138 mmol/L (ref 135–145)
Total Bilirubin: 0.9 mg/dL (ref 0.3–1.2)
Total Protein: 6.4 g/dL — ABNORMAL LOW (ref 6.5–8.1)

## 2015-02-01 LAB — CBC WITH DIFFERENTIAL/PLATELET
BASOS ABS: 0 10*3/uL (ref 0.0–0.1)
BASOS PCT: 1 %
EOS ABS: 0.1 10*3/uL (ref 0.0–0.7)
EOS PCT: 4 %
HCT: 28.7 % — ABNORMAL LOW (ref 39.0–52.0)
Hemoglobin: 9.5 g/dL — ABNORMAL LOW (ref 13.0–17.0)
Lymphocytes Relative: 62 %
Lymphs Abs: 1.2 10*3/uL (ref 0.7–4.0)
MCH: 37.3 pg — AB (ref 26.0–34.0)
MCHC: 33.1 g/dL (ref 30.0–36.0)
MCV: 112.5 fL — ABNORMAL HIGH (ref 78.0–100.0)
MONO ABS: 0.1 10*3/uL (ref 0.1–1.0)
Monocytes Relative: 6 %
NEUTROS ABS: 0.5 10*3/uL — AB (ref 1.7–7.7)
Neutrophils Relative %: 27 %
PLATELETS: 18 10*3/uL — AB (ref 150–400)
RBC: 2.55 MIL/uL — ABNORMAL LOW (ref 4.22–5.81)
RDW: 17.2 % — AB (ref 11.5–15.5)
Smear Review: DECREASED
WBC: 2 10*3/uL — ABNORMAL LOW (ref 4.0–10.5)

## 2015-02-01 LAB — SEDIMENTATION RATE: SED RATE: 30 mm/h — AB (ref 0–16)

## 2015-02-01 LAB — LACTATE DEHYDROGENASE: LDH: 145 U/L (ref 98–192)

## 2015-02-01 LAB — C-REACTIVE PROTEIN: CRP: 1 mg/dL — AB (ref <1.0)

## 2015-02-01 MED ORDER — DIPHENHYDRAMINE HCL 25 MG PO CAPS
ORAL_CAPSULE | ORAL | Status: AC
  Filled 2015-02-01: qty 1

## 2015-02-01 MED ORDER — ACETAMINOPHEN 325 MG PO TABS
650.0000 mg | ORAL_TABLET | Freq: Once | ORAL | Status: AC
  Administered 2015-02-01: 650 mg via ORAL

## 2015-02-01 MED ORDER — SODIUM CHLORIDE 0.9 % IJ SOLN
10.0000 mL | INTRAMUSCULAR | Status: AC | PRN
  Administered 2015-02-01: 10 mL

## 2015-02-01 MED ORDER — SODIUM CHLORIDE 0.9 % IV SOLN
250.0000 mL | Freq: Once | INTRAVENOUS | Status: AC
Start: 2015-02-01 — End: 2015-02-01
  Administered 2015-02-01: 250 mL via INTRAVENOUS

## 2015-02-01 MED ORDER — DIPHENHYDRAMINE HCL 25 MG PO CAPS
25.0000 mg | ORAL_CAPSULE | Freq: Once | ORAL | Status: AC
  Administered 2015-02-01: 25 mg via ORAL

## 2015-02-01 MED ORDER — ACETAMINOPHEN 325 MG PO TABS
ORAL_TABLET | ORAL | Status: AC
  Filled 2015-02-01: qty 2

## 2015-02-01 NOTE — Patient Instructions (Signed)
[facility] at [facility] Discharge Instructions  RECOMMENDATIONS MADE BY THE CONSULTANT AND ANY TEST RESULTS WILL BE SENT TO YOUR REFERRING PHYSICIAN.  Platelet infusion today. Return as scheduled for lab work and office visit.  Thank you for choosing [facility] at [facility] to provide your oncology and hematology care.  To afford each patient quality time with our provider, please arrive at least 15 minutes before your scheduled appointment time.    You need to re-schedule your appointment should you arrive 10 or more minutes late.  We strive to give you quality time with our providers, and arriving late affects you and other patients whose appointments are after yours.  Also, if you no show three or more times for appointments you may be dismissed from the clinic at the providers discretion.     Again, thank you for choosing [facility].  Our hope is that these requests will decrease the amount of time that you wait before being seen by our physicians.       _____________________________________________________________  Should you have questions after your visit to [facility], please contact our office at [phone] between the hours of 8:30 a.m. and 4:30 p.m.  Voicemails left after 4:30 p.m. will not be returned until the following business day.  For prescription refill requests, have your pharmacy contact our office.    

## 2015-02-01 NOTE — Telephone Encounter (Signed)
VM left for patient.  I have discussed his case via [facility] messaging with Dr. Adele Barthel and he agrees that the patient would be a candidate for port placement.  [name] reports that he would like to have his port placed by Dr. Bridgett Larsson if possible.  I messaged Dr. Bridgett Larsson this information and he will plan on contacting the patient for an appointment and port placement.  KEFALAS,THOMAS 02/01/2015 2:03 PM

## 2015-02-01 NOTE — Progress Notes (Signed)
CRITICAL VALUE ALERT  Critical value received:  Platelets 18,000  Date of notification:  02/01/15  Time of notification:  0921  Critical value read back:Yes.    Nurse who received alert:  Isidoro Donning RN  Kirby Crigler PA-C notified and he said to administer regular platelets today. Orders placed and Anastasio Champion RN notified that patient needed platelets today.

## 2015-02-01 NOTE — Progress Notes (Signed)
1245:  Tolerated platelet infusion w/o adverse reaction.  VSS.  A&Ox4, in no distress.  Discharged ambulatory.

## 2015-02-02 LAB — PREPARE PLATELET PHERESIS: UNIT DIVISION: 0

## 2015-02-03 LAB — KAPPA/LAMBDA LIGHT CHAINS
KAPPA FREE LGHT CHN: 245.53 mg/L — AB (ref 3.30–19.40)
Kappa, lambda light chain ratio: 2.22 — ABNORMAL HIGH (ref 0.26–1.65)
LAMDA FREE LIGHT CHAINS: 110.44 mg/L — AB (ref 5.71–26.30)

## 2015-02-03 LAB — BETA 2 MICROGLOBULIN, SERUM: Beta-2 Microglobulin: 15.8 mg/L — ABNORMAL HIGH (ref 0.6–2.4)

## 2015-02-04 LAB — MULTIPLE MYELOMA PANEL, SERUM
ALBUMIN SERPL ELPH-MCNC: 3.8 g/dL (ref 2.9–4.4)
ALPHA 1: 0.2 g/dL (ref 0.0–0.4)
Albumin/Glob SerPl: 1.6 (ref 0.7–1.7)
Alpha2 Glob SerPl Elph-Mcnc: 0.4 g/dL (ref 0.4–1.0)
B-Globulin SerPl Elph-Mcnc: 0.9 g/dL (ref 0.7–1.3)
GAMMA GLOB SERPL ELPH-MCNC: 1 g/dL (ref 0.4–1.8)
GLOBULIN, TOTAL: 2.5 g/dL (ref 2.2–3.9)
IGA: 252 mg/dL (ref 61–437)
IgG (Immunoglobin G), Serum: 884 mg/dL (ref 700–1600)
IgM, Serum: 76 mg/dL (ref 20–172)
Total Protein ELP: 6.3 g/dL (ref 6.0–8.5)

## 2015-02-04 NOTE — Progress Notes (Signed)
LABS DRAWN

## 2015-02-06 ENCOUNTER — Telehealth (HOSPITAL_COMMUNITY): Payer: Self-pay | Admitting: Oncology

## 2015-02-06 NOTE — Telephone Encounter (Signed)
I have returned the patient's phone call.  No answer.  VM left.  Robynn Pane, PA-C 02/06/2015 5:32 PM

## 2015-02-11 ENCOUNTER — Other Ambulatory Visit: Payer: Self-pay

## 2015-02-12 ENCOUNTER — Other Ambulatory Visit (HOSPITAL_COMMUNITY): Payer: Self-pay | Admitting: Oncology

## 2015-02-12 DIAGNOSIS — C9000 Multiple myeloma not having achieved remission: Secondary | ICD-10-CM

## 2015-02-12 MED ORDER — LENALIDOMIDE 5 MG PO CAPS: ORAL_CAPSULE | ORAL | Status: DC

## 2015-02-13 ENCOUNTER — Other Ambulatory Visit (HOSPITAL_COMMUNITY): Payer: Self-pay | Admitting: Hematology & Oncology

## 2015-02-13 DIAGNOSIS — R58 Hemorrhage, not elsewhere classified: Secondary | ICD-10-CM

## 2015-02-13 DIAGNOSIS — D696 Thrombocytopenia, unspecified: Secondary | ICD-10-CM

## 2015-02-15 ENCOUNTER — Other Ambulatory Visit: Payer: Self-pay

## 2015-02-15 ENCOUNTER — Encounter (HOSPITAL_COMMUNITY): Payer: Self-pay | Admitting: *Deleted

## 2015-02-15 ENCOUNTER — Encounter (HOSPITAL_BASED_OUTPATIENT_CLINIC_OR_DEPARTMENT_OTHER): Payer: Medicare Other

## 2015-02-15 ENCOUNTER — Encounter (HOSPITAL_COMMUNITY): Payer: Self-pay

## 2015-02-15 ENCOUNTER — Other Ambulatory Visit (HOSPITAL_COMMUNITY): Payer: Medicare Other

## 2015-02-15 DIAGNOSIS — D696 Thrombocytopenia, unspecified: Secondary | ICD-10-CM

## 2015-02-15 DIAGNOSIS — R58 Hemorrhage, not elsewhere classified: Secondary | ICD-10-CM

## 2015-02-15 DIAGNOSIS — C9000 Multiple myeloma not having achieved remission: Secondary | ICD-10-CM | POA: Diagnosis not present

## 2015-02-15 LAB — CBC WITH DIFFERENTIAL/PLATELET
BASOS ABS: 0 10*3/uL (ref 0.0–0.1)
Basophils Relative: 0 %
EOS PCT: 4 %
Eosinophils Absolute: 0.1 10*3/uL (ref 0.0–0.7)
HEMATOCRIT: 24.7 % — AB (ref 39.0–52.0)
HEMOGLOBIN: 8.1 g/dL — AB (ref 13.0–17.0)
LYMPHS PCT: 56 %
Lymphs Abs: 1.3 10*3/uL (ref 0.7–4.0)
MCH: 36.8 pg — AB (ref 26.0–34.0)
MCHC: 32.8 g/dL (ref 30.0–36.0)
MCV: 112.3 fL — ABNORMAL HIGH (ref 78.0–100.0)
MONO ABS: 0.1 10*3/uL (ref 0.1–1.0)
MONOS PCT: 6 %
NEUTROS ABS: 0.8 10*3/uL — AB (ref 1.7–7.7)
Neutrophils Relative %: 34 %
Platelets: 18 10*3/uL — CL (ref 150–400)
RBC: 2.2 MIL/uL — ABNORMAL LOW (ref 4.22–5.81)
RDW: 16.2 % — AB (ref 11.5–15.5)
Smear Review: DECREASED
WBC: 2.3 10*3/uL — ABNORMAL LOW (ref 4.0–10.5)

## 2015-02-15 LAB — SAMPLE TO BLOOD BANK

## 2015-02-15 MED ORDER — ACETAMINOPHEN 325 MG PO TABS
ORAL_TABLET | ORAL | Status: AC
  Filled 2015-02-15: qty 2

## 2015-02-15 MED ORDER — DIPHENHYDRAMINE HCL 25 MG PO CAPS
25.0000 mg | ORAL_CAPSULE | Freq: Once | ORAL | Status: AC
  Administered 2015-02-15: 25 mg via ORAL

## 2015-02-15 MED ORDER — DIPHENHYDRAMINE HCL 25 MG PO CAPS
ORAL_CAPSULE | ORAL | Status: AC
  Filled 2015-02-15: qty 1

## 2015-02-15 MED ORDER — ACETAMINOPHEN 325 MG PO TABS
650.0000 mg | ORAL_TABLET | Freq: Once | ORAL | Status: AC
  Administered 2015-02-15: 650 mg via ORAL

## 2015-02-15 MED ORDER — SODIUM CHLORIDE 0.9 % IV SOLN
250.0000 mL | Freq: Once | INTRAVENOUS | Status: AC
Start: 2015-02-15 — End: 2015-02-15
  Administered 2015-02-15: 250 mL via INTRAVENOUS

## 2015-02-15 MED ORDER — SODIUM CHLORIDE 0.9 % IJ SOLN
10.0000 mL | INTRAMUSCULAR | Status: AC | PRN
  Administered 2015-02-15: 10 mL

## 2015-02-15 NOTE — Progress Notes (Signed)
Anesthesia Chart Review: SAME DAY WORK-UP.  Patient is a 69 year old male scheduled for Mediport insertion, left chest on 02/18/15 by Dr. Bridgett Larsson. Patient requested port for blood tests and infusions.   History includes multiple myeloma (diagnosed '06) S/P 2 autologous BM transplant at [facility] under the guidance of Dr. Marcell Anger (retired) with the second bone marrow transplant occuring in July 2013 with recent bone marrow demonstrates 5% plasma cells 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   PCP is listed as Dr. JSharilyn Sites [facility] ([place]) is Dr. SAncil Linsey Cardiologist is with [facility], last seen by Dr. JAlmira Coaster9/28/15 for HTN follow-up.  Meds list has yet to be updated.   09/01/14 EKG: SR, PVCs, non-specific IVCD.  According to cardiology notes in Care Everywhere dated  09/28/13: "-echocardiogram January 2015 ejection fraction 60-65%, 1+ TR, 1+ left atrial enlargement, G1DD, 1+ MR." [facility].  [facility] a H/H 8.1/24.7 (down from 01/22/27.7 on 02/01/15) and PLT count 18 K (also 18 K 02/01/15 s/p PLT transfusion). WBC 2.3, up from 2.0. CMET was done on 02/01/15. [facility] Called results to [facility] RN SColletta [place] She has reviewed with Dr. CBridgett Larsson Patient to arrive 3 hours early, get an ISTAT, CBC, T&C and, he he wants patient to get PLT infusion while he is in Holding. Plans to proceed will depend on results. Short Stay Holding charge RN notified.  AGe[facility] HughMSt [facility]est[facility]iology Phone ([phone]/30/2016 12:50 PM

## 2015-02-15 NOTE — Addendum Note (Signed)
Addended by: Elenor Legato on: 02/15/2015 11:24 AM   Modules accepted: Orders, SmartSet

## 2015-02-15 NOTE — Progress Notes (Signed)
CRITICAL VALUE ALERT Critical value received:  Platelets 18,000 Date of notification:  02/15/2015 Time of notification: 6789 Critical value read back:  Yes.   Nurse who received alert:  Shellia Carwin RN MD notified (1st page):  Dr Whitney Muse

## 2015-02-15 NOTE — Progress Notes (Signed)
LABS DRAWN

## 2015-02-15 NOTE — Progress Notes (Signed)
Pt stated at beginning of pre-op call that he had just returned from the cancer center and he was told his platelets were 18,000 and his Hgb was 8.1. He states he's going back this afternoon to the cancer center to get an infusion of platelets.   Pt denies any cardiac history, chest pain or sob.   Instructed pt to stop Fish Oil as of today. He voiced understanding.

## 2015-02-17 LAB — PREPARE PLATELET PHERESIS: Unit division: 0

## 2015-02-17 MED ORDER — DEXTROSE 5 % IV SOLN
1.5000 g | INTRAVENOUS | Status: AC
  Administered 2015-02-18: 1.5 g via INTRAVENOUS
  Filled 2015-02-17: qty 1.5

## 2015-02-18 ENCOUNTER — Ambulatory Visit (HOSPITAL_COMMUNITY): Payer: Medicare Other | Admitting: Vascular Surgery

## 2015-02-18 ENCOUNTER — Ambulatory Visit (HOSPITAL_COMMUNITY): Payer: Medicare Other

## 2015-02-18 ENCOUNTER — Ambulatory Visit (HOSPITAL_COMMUNITY)
Admission: RE | Admit: 2015-02-18 | Discharge: 2015-02-18 | Disposition: A | Discharge status: Home / Self Care | Payer: Medicare Other | Source: Ambulatory Visit | Attending: Vascular Surgery | Admitting: Vascular Surgery

## 2015-02-18 ENCOUNTER — Other Ambulatory Visit (HOSPITAL_COMMUNITY): Payer: Medicare Other

## 2015-02-18 ENCOUNTER — Encounter (HOSPITAL_COMMUNITY): Payer: Self-pay | Admitting: Anesthesiology

## 2015-02-18 ENCOUNTER — Encounter (HOSPITAL_COMMUNITY)
Admission: RE | Disposition: A | Discharge status: Home / Self Care | Payer: Self-pay | Source: Ambulatory Visit | Attending: Vascular Surgery

## 2015-02-18 ENCOUNTER — Ambulatory Visit (HOSPITAL_COMMUNITY): Payer: Medicare Other | Admitting: Oncology

## 2015-02-18 DIAGNOSIS — Z6833 Body mass index (BMI) 33.0-33.9, adult: Secondary | ICD-10-CM | POA: Diagnosis not present

## 2015-02-18 DIAGNOSIS — Z992 Dependence on renal dialysis: Secondary | ICD-10-CM | POA: Insufficient documentation

## 2015-02-18 DIAGNOSIS — C9000 Multiple myeloma not having achieved remission: Secondary | ICD-10-CM | POA: Insufficient documentation

## 2015-02-18 DIAGNOSIS — J449 Chronic obstructive pulmonary disease, unspecified: Secondary | ICD-10-CM | POA: Insufficient documentation

## 2015-02-18 DIAGNOSIS — D638 Anemia in other chronic diseases classified elsewhere: Secondary | ICD-10-CM | POA: Insufficient documentation

## 2015-02-18 DIAGNOSIS — Z9481 Bone marrow transplant status: Secondary | ICD-10-CM | POA: Diagnosis not present

## 2015-02-18 DIAGNOSIS — Z79899 Other long term (current) drug therapy: Secondary | ICD-10-CM | POA: Insufficient documentation

## 2015-02-18 DIAGNOSIS — I120 Hypertensive chronic kidney disease with stage 5 chronic kidney disease or end stage renal disease: Secondary | ICD-10-CM | POA: Insufficient documentation

## 2015-02-18 DIAGNOSIS — E559 Vitamin D deficiency, unspecified: Secondary | ICD-10-CM | POA: Diagnosis not present

## 2015-02-18 DIAGNOSIS — D696 Thrombocytopenia, unspecified: Secondary | ICD-10-CM | POA: Insufficient documentation

## 2015-02-18 DIAGNOSIS — N185 Chronic kidney disease, stage 5: Secondary | ICD-10-CM | POA: Insufficient documentation

## 2015-02-18 HISTORY — PX: PORTACATH PLACEMENT: SHX2246

## 2015-02-18 LAB — POCT I-STAT 4, (NA,K, GLUC, HGB,HCT)
Glucose, Bld: 103 mg/dL — ABNORMAL HIGH (ref 65–99)
HCT: 22 % — ABNORMAL LOW (ref 39.0–52.0)
HEMOGLOBIN: 7.5 g/dL — AB (ref 13.0–17.0)
POTASSIUM: 4.4 mmol/L (ref 3.5–5.1)
SODIUM: 139 mmol/L (ref 135–145)

## 2015-02-18 LAB — TYPE AND SCREEN
ABO/RH(D): A POS
ANTIBODY SCREEN: NEGATIVE

## 2015-02-18 LAB — CBC
HEMATOCRIT: 22.6 % — AB (ref 39.0–52.0)
HEMOGLOBIN: 7.4 g/dL — AB (ref 13.0–17.0)
MCH: 36.6 pg — ABNORMAL HIGH (ref 26.0–34.0)
MCHC: 32.7 g/dL (ref 30.0–36.0)
MCV: 111.9 fL — ABNORMAL HIGH (ref 78.0–100.0)
Platelets: 41 10*3/uL — ABNORMAL LOW (ref 150–400)
RBC: 2.02 MIL/uL — ABNORMAL LOW (ref 4.22–5.81)
RDW: 16 % — AB (ref 11.5–15.5)
WBC: 2 10*3/uL — ABNORMAL LOW (ref 4.0–10.5)

## 2015-02-18 LAB — ABO/RH: ABO/RH(D): A POS

## 2015-02-18 SURGERY — INSERTION, TUNNELED CENTRAL VENOUS DEVICE, WITH PORT
Anesthesia: Monitor Anesthesia Care | Site: Chest | Laterality: Right

## 2015-02-18 MED ORDER — IOHEXOL 300 MG/ML  SOLN
INTRAMUSCULAR | Status: DC | PRN
  Administered 2015-02-18: 8 mL via INTRAVENOUS

## 2015-02-18 MED ORDER — SODIUM CHLORIDE 0.9 % IV SOLN
INTRAVENOUS | Status: DC | PRN
  Administered 2015-02-18: 500 mL

## 2015-02-18 MED ORDER — LIDOCAINE HCL (PF) 1 % IJ SOLN
INTRAMUSCULAR | Status: AC
  Filled 2015-02-18: qty 30

## 2015-02-18 MED ORDER — 0.9 % SODIUM CHLORIDE (POUR BTL) OPTIME
TOPICAL | Status: DC | PRN
  Administered 2015-02-18: 1000 mL

## 2015-02-18 MED ORDER — ONDANSETRON HCL 4 MG/2ML IJ SOLN
INTRAMUSCULAR | Status: DC | PRN
  Administered 2015-02-18: 4 mg via INTRAVENOUS

## 2015-02-18 MED ORDER — OXYCODONE-ACETAMINOPHEN 5-325 MG PO TABS: 1.0000 | ORAL_TABLET | Freq: Four times a day (QID) | ORAL | Status: DC | PRN

## 2015-02-18 MED ORDER — FENTANYL CITRATE (PF) 100 MCG/2ML IJ SOLN
INTRAMUSCULAR | Status: DC | PRN
  Administered 2015-02-18: 50 ug via INTRAVENOUS

## 2015-02-18 MED ORDER — MEPERIDINE HCL 25 MG/ML IJ SOLN: 6.2500 mg | INTRAMUSCULAR | Status: DC | PRN

## 2015-02-18 MED ORDER — FENTANYL CITRATE (PF) 250 MCG/5ML IJ SOLN
INTRAMUSCULAR | Status: AC
  Filled 2015-02-18: qty 5

## 2015-02-18 MED ORDER — THROMBIN 20000 UNITS EX SOLR
CUTANEOUS | Status: DC | PRN
  Administered 2015-02-18: 20 mL via TOPICAL

## 2015-02-18 MED ORDER — HEPARIN SODIUM (PORCINE) 1000 UNIT/ML IJ SOLN
INTRAMUSCULAR | Status: AC
  Filled 2015-02-18: qty 1

## 2015-02-18 MED ORDER — FENTANYL CITRATE (PF) 100 MCG/2ML IJ SOLN: 25.0000 ug | INTRAMUSCULAR | Status: DC | PRN

## 2015-02-18 MED ORDER — HEPARIN SODIUM (PORCINE) 1000 UNIT/ML IJ SOLN: INTRAMUSCULAR | Status: DC | PRN

## 2015-02-18 MED ORDER — THROMBIN 20000 UNITS EX SOLR
CUTANEOUS | Status: AC
  Filled 2015-02-18: qty 20000

## 2015-02-18 MED ORDER — MIDAZOLAM HCL 2 MG/2ML IJ SOLN
INTRAMUSCULAR | Status: AC
  Filled 2015-02-18: qty 4

## 2015-02-18 MED ORDER — LIDOCAINE HCL (CARDIAC) 20 MG/ML IV SOLN
INTRAVENOUS | Status: DC | PRN
  Administered 2015-02-18: 40 mg via INTRAVENOUS

## 2015-02-18 MED ORDER — PROPOFOL 500 MG/50ML IV EMUL
INTRAVENOUS | Status: DC | PRN
  Administered 2015-02-18: 50 ug/kg/min via INTRAVENOUS

## 2015-02-18 MED ORDER — CHLORHEXIDINE GLUCONATE CLOTH 2 % EX PADS: 6.0000 | MEDICATED_PAD | Freq: Once | CUTANEOUS | Status: DC

## 2015-02-18 MED ORDER — MIDAZOLAM HCL 5 MG/5ML IJ SOLN
INTRAMUSCULAR | Status: DC | PRN
  Administered 2015-02-18 (×2): 1 mg via INTRAVENOUS

## 2015-02-18 MED ORDER — SODIUM CHLORIDE 0.9 % IV SOLN
INTRAVENOUS | Status: DC
  Administered 2015-02-18: 09:00:00 via INTRAVENOUS

## 2015-02-18 MED ORDER — SODIUM CHLORIDE 0.9 % IV SOLN
INTRAVENOUS | Status: DC | PRN
  Administered 2015-02-18: 10:00:00 via INTRAVENOUS

## 2015-02-18 MED ORDER — LIDOCAINE HCL (PF) 1 % IJ SOLN
INTRAMUSCULAR | Status: DC | PRN
  Administered 2015-02-18: 10 mL
  Administered 2015-02-18 (×2): 30 mL

## 2015-02-18 MED ORDER — MIDAZOLAM HCL 2 MG/2ML IJ SOLN: 0.5000 mg | Freq: Once | INTRAMUSCULAR | Status: DC | PRN

## 2015-02-18 MED ORDER — PROMETHAZINE HCL 25 MG/ML IJ SOLN: 6.2500 mg | INTRAMUSCULAR | Status: DC | PRN

## 2015-02-18 SURGICAL SUPPLY — 59 items
BAG BANDED W/RUBBER/TAPE 36X54 (MISCELLANEOUS) ×2 IMPLANT
BAG DECANTER FOR FLEXI CONT (MISCELLANEOUS) ×4 IMPLANT
BAG EQP BAND 135X91 W/RBR TAPE (MISCELLANEOUS) ×2
BIOPATCH RED 1 DISK 7.0 (GAUZE/BANDAGES/DRESSINGS) ×2 IMPLANT
BIOPATCH RED 1IN DISK 7.0MM (GAUZE/BANDAGES/DRESSINGS)
CATH CANNON HEMO 15F 50CM (CATHETERS) IMPLANT
CATH CANNON HEMO 15FR 19 (HEMODIALYSIS SUPPLIES) IMPLANT
CATH CANNON HEMO 15FR 23CM (HEMODIALYSIS SUPPLIES) IMPLANT
CATH CANNON HEMO 15FR 31CM (HEMODIALYSIS SUPPLIES) IMPLANT
CATH CANNON HEMO 15FR 32 (HEMODIALYSIS SUPPLIES) IMPLANT
CATH CANNON HEMO 15FR 32CM (HEMODIALYSIS SUPPLIES) IMPLANT
CATH STRAIGHT 5FR 65CM (CATHETERS) IMPLANT
COVER DOME SNAP 22 D (MISCELLANEOUS) ×2 IMPLANT
COVER PROBE W GEL 5X96 (DRAPES) ×4 IMPLANT
COVER SURGICAL LIGHT HANDLE (MISCELLANEOUS) ×2 IMPLANT
DRAPE C-ARM 42X72 X-RAY (DRAPES) ×2 IMPLANT
DRAPE CHEST BREAST 15X10 FENES (DRAPES) ×4 IMPLANT
ELECT CAUTERY BLADE 6.4 (BLADE) ×2 IMPLANT
ELECT REM PT RETURN 9FT ADLT (ELECTROSURGICAL) ×4
ELECTRODE REM PT RTRN 9FT ADLT (ELECTROSURGICAL) IMPLANT
GAUZE SPONGE 2X2 8PLY STRL LF (GAUZE/BANDAGES/DRESSINGS) ×2 IMPLANT
GAUZE SPONGE 4X4 16PLY XRAY LF (GAUZE/BANDAGES/DRESSINGS) ×6 IMPLANT
GLOVE BIO SURGEON STRL SZ7 (GLOVE) ×4 IMPLANT
GLOVE BIOGEL PI IND STRL 6.5 (GLOVE) IMPLANT
GLOVE BIOGEL PI IND STRL 7.5 (GLOVE) ×2 IMPLANT
GLOVE BIOGEL PI INDICATOR 6.5 (GLOVE) ×2
GLOVE BIOGEL PI INDICATOR 7.5 (GLOVE) ×2
GLOVE ECLIPSE 7.5 STRL STRAW (GLOVE) ×4 IMPLANT
GOWN STRL REUS W/ TWL LRG LVL3 (GOWN DISPOSABLE) ×4 IMPLANT
GOWN STRL REUS W/ TWL XL LVL3 (GOWN DISPOSABLE) IMPLANT
GOWN STRL REUS W/TWL LRG LVL3 (GOWN DISPOSABLE) ×8
GOWN STRL REUS W/TWL XL LVL3 (GOWN DISPOSABLE) ×4
KIT BASIN OR (CUSTOM PROCEDURE TRAY) ×4 IMPLANT
KIT POWER CATH 8FR (Catheter) ×2 IMPLANT
KIT ROOM TURNOVER OR (KITS) ×4 IMPLANT
LIQUID BAND (GAUZE/BANDAGES/DRESSINGS) ×2 IMPLANT
NDL 18GX1X1/2 (RX/OR ONLY) (NEEDLE) ×2 IMPLANT
NDL HYPO 25GX1X1/2 BEV (NEEDLE) ×2 IMPLANT
NEEDLE 18GX1X1/2 (RX/OR ONLY) (NEEDLE) ×4 IMPLANT
NEEDLE HYPO 25GX1X1/2 BEV (NEEDLE) ×8 IMPLANT
NS IRRIG 1000ML POUR BTL (IV SOLUTION) ×4 IMPLANT
PACK SURGICAL SETUP 50X90 (CUSTOM PROCEDURE TRAY) ×4 IMPLANT
PAD ARMBOARD 7.5X6 YLW CONV (MISCELLANEOUS) ×8 IMPLANT
PENCIL BUTTON HOLSTER BLD 10FT (ELECTRODE) ×2 IMPLANT
SET MICROPUNCTURE 5F STIFF (MISCELLANEOUS) ×4 IMPLANT
SOAP 2 % CHG 4 OZ (WOUND CARE) ×4 IMPLANT
SPONGE GAUZE 2X2 STER 10/PKG (GAUZE/BANDAGES/DRESSINGS) ×2
SUT ETHILON 3 0 PS 1 (SUTURE) ×4 IMPLANT
SUT MNCRL AB 4-0 PS2 18 (SUTURE) ×4 IMPLANT
SUT SILK 3 0 SH 30 (SUTURE) ×4 IMPLANT
SUT VIC AB 3-0 SH 27 (SUTURE) ×4
SUT VIC AB 3-0 SH 27X BRD (SUTURE) IMPLANT
SYR 20CC LL (SYRINGE) ×8 IMPLANT
SYR 3ML LL SCALE MARK (SYRINGE) ×4 IMPLANT
SYR 5ML LL (SYRINGE) ×4 IMPLANT
SYR CONTROL 10ML LL (SYRINGE) ×6 IMPLANT
SYRINGE 10CC LL (SYRINGE) ×4 IMPLANT
WATER STERILE IRR 1000ML POUR (IV SOLUTION) ×4 IMPLANT
WIRE AMPLATZ SS-J .035X180CM (WIRE) IMPLANT

## 2015-02-18 NOTE — Anesthesia Procedure Notes (Signed)
Procedure Name: MAC Date/Time: 02/18/2015 10:35 AM Performed by: Susa Loffler Pre-anesthesia Checklist: Patient identified, Emergency Drugs available, Suction available, Patient being monitored and Timeout performed Patient Re-evaluated:Patient Re-evaluated prior to inductionOxygen Delivery Method: Simple face mask Dental Injury: Teeth and Oropharynx as per pre-operative assessment

## 2015-02-18 NOTE — H&P (Addendum)
Brief History and Physical  History of Present Illness  [name] is a 69 y.o. male who presents with chief complaint: multiple myeloma.  The [name] presents today for L sided mediport placement.  Pt recently got platelets transfused for a platelet count 16K.  Past Medical History  Diagnosis Date  . Hypertension   . Ruptured cervical disc   . Fall resulting in striking against other object     paralyzed  . Multiple myeloma   . 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 Multiple myeloma 01/26/2011  . Unspecified vitamin D deficiency 03/06/2013  . Anemia of chronic disease 12/30/2013  . Constipation   . Dialysis [name] 2016  . Thrombocytopenia 08/31/2014  . Chronic renal disease, stage 5, glomerular filtration rate less than or equal to 15 mL/min/1.73 square meter 12/30/2013    T/TH/Sat dialysis  . Constipation     Past Surgical History  Procedure Laterality Date  . Inguinal hernia repair Bilateral   . Anterior cervical decomp/discectomy fusion    . Bone marrow aspirate and biopsy wiith lumbar puncture    . Melanoma excision      rt. breast  . Cervical spine surgery    . Kyphoplasty Bilateral 01/30/2014    Procedure: Thoracic eleven Kyphoplasty;  Surgeon: Consuella Lose, MD;  Location: [facility];  Service: Neurosurgery;  Laterality: Bilateral;  Thoracic eleven Kyphoplasty  . Av fistula placement Right 04/30/2014    Procedure: BRACHIOCEPHALIC ARTERIOVENOUS (AV) FISTULA CREATION;  Surgeon: Conrad Lugoff, MD;  Location: [place];  Service: Vascular;  Laterality: Right;    Social History   Social History  . Marital Status: Divorced    Spouse Name: N/A  . Number of Children: N/A  . Years of Education: N/A   Occupational History  . Not on file.   Social History Main Topics  . Smoking status: Never Smoker   . Smokeless tobacco: Never Used  . Alcohol Use: No  . Drug Use: No  . Sexual Activity: Not on file   Other Topics Concern    . Not on file   Social History Narrative    Family History  Problem Relation Age of Onset  . Cancer Sister   . Cancer Brother     No current facility-administered medications on file prior to encounter.   Current Outpatient Prescriptions on File Prior to Encounter  Medication Sig Dispense Refill  . albuterol (PROVENTIL) (2.5 MG/3ML) 0.083% nebulizer solution Take 3 mLs (2.5 mg total) by nebulization every 6 (six) hours as needed for wheezing or shortness of breath. ([name] not taking: Reported on 12/19/2014) 20 vial 1  . calcium acetate (PHOSLO) 667 MG capsule Take 2,001 mg by mouth 3 (three) times daily with meals.    . chlorpheniramine-HYDROcodone (TUSSIONEX PENNKINETIC ER) 10-8 MG/5ML SUER Take 5 mLs by mouth every 12 (twelve) hours as needed for cough. ([name] not taking: Reported on 10/05/2014) 100 mL 0  . docusate sodium (COLACE) 100 MG capsule Take 100 mg by mouth daily.     . furosemide (LASIX) 20 MG tablet Take 40 mg by mouth daily.    . hydrocortisone cream 0.5 % Apply 1 application topically 2 (two) times daily. ([name] not taking: Reported on 09/21/2014) 30 g 0  . metoprolol succinate (TOPROL-XL) 25 MG 24 hr tablet Take 25 mg by mouth daily.    Marland Kitchen omega-3 acid ethyl esters (LOVAZA) 1 G capsule Take 1 g by mouth.    Marland Kitchen  Omega-3 Fatty Acids (FISH OIL) 1000 MG CAPS Take 2 capsules by mouth daily.    Marland Kitchen oxyCODONE-acetaminophen (ROXICET) 5-325 MG per tablet Take 1 tablet by mouth every 6 (six) hours as needed for severe pain. ([name] not taking: Reported on 11/21/2014) 15 tablet 0  . pregabalin (LYRICA) 75 MG capsule Take 1 capsule (75 mg total) by mouth 2 (two) times daily. 60 capsule 5    Allergies  Allergen Reactions  . Pamidronate Anaphylaxis    Blood pressure     Review of Systems: As listed above, otherwise negative.  Physical Examination  There were no vitals filed for this visit.  General: A&O x 3, WDWN   Pulmonary: Sym exp, good air movt, CTAB, no rales, rhonchi,  & wheezing  Cardiac: RRR, Nl S1, S2, no Murmurs, rubs or gallops  Gastrointestinal: soft, NTND, -G/R, - HSM, - masses, - CVAT B  Musculoskeletal: M/S 5/5 throughout , Extremities without ischemic changes , palpable thrill in R UA  Laboratory See [place] is a 69 y.o. male who presents with: multiple myeloma.   [name] will get his platelet and then his mediport will be placed.  The [name] is scheduled for: L side mediport placement. The [name] is aware the risks of mediport  placement include but are not limited to: bleeding, infection, central venous injury, pneumothorax, possible venous stenosis, possible malpositioning in the venous system, and possible infections related to long-term catheter presence.   The [name] is aware of the risks and agrees to proceed.  Adele Barthel, MD [facility] Office: [phone] Pager: [phone]  02/18/2015, 7:29 AM

## 2015-02-18 NOTE — Anesthesia Preprocedure Evaluation (Addendum)
Anesthesia Evaluation  Patient identified by MRN, date of birth, ID band Patient awake    Reviewed: Allergy & Precautions, NPO status , Patient's Chart, lab work & pertinent test results, reviewed documented beta blocker date and time   History of Anesthesia Complications Negative for: history of anesthetic complications  Airway Mallampati: I  TM Distance: >3 FB Neck ROM: Full    Dental  (+) Partial Lower, Partial Upper, Dental Advisory Given   Pulmonary COPD,  COPD inhaler,    breath sounds clear to auscultation       Cardiovascular hypertension, Pt. on medications and Pt. on home beta blockers (-) angina Rhythm:Regular Rate:Normal  '09 ECHO: EF 60% trivial MR, aortic valve calcification without stenosis   Neuro/Psych    GI/Hepatic negative GI ROS, Neg liver ROS,   Endo/Other  Morbid obesity  Renal/GU ESRF and DialysisRenal disease (TuThSa, K+ 4.4)     Musculoskeletal   Abdominal (+) + obese,   Peds  Hematology  (+) Blood dyscrasia (Hb 7.5, plt 18K), anemia ,   Anesthesia Other Findings Multiple myeloma: chemo, bone marrow transplant x2  Reproductive/Obstetrics                        Anesthesia Physical Anesthesia Plan  ASA: IV  Anesthesia Plan: MAC   Post-op Pain Management:    Induction:   Airway Management Planned: Natural Airway and Simple Face Mask  Additional Equipment:   Intra-op Plan:   Post-operative Plan:   Informed Consent: I have reviewed the patients History and Physical, chart, labs and discussed the procedure including the risks, benefits and alternatives for the proposed anesthesia with the patient or authorized representative who has indicated his/her understanding and acceptance.   Dental advisory given  Plan Discussed with: CRNA and Surgeon  Anesthesia Plan Comments: (Plan routine monitors, MAC)        Anesthesia Quick Evaluation

## 2015-02-18 NOTE — Anesthesia Postprocedure Evaluation (Signed)
  Anesthesia Post-op Note  Patient: [name]  Procedure(s) Performed: Procedure(s): INSERTION OF PORT A CATH RIGHT INTERNAL JUGULAR VEIN  (Left) CENTRAL VENOGRAM (Right)  Patient Location: PACU  Anesthesia Type:MAC  Level of Consciousness: awake, alert , oriented and patient cooperative  Airway and Oxygen Therapy: Patient Spontanous Breathing  Post-op Pain: none  Post-op Assessment: Post-op Vital signs reviewed, Patient's Cardiovascular Status Stable, Respiratory Function Stable, Patent Airway, No signs of Nausea or vomiting and Pain level controlled              Post-op Vital Signs: Reviewed and stable  Last Vitals:  Filed Vitals:   02/18/15 1200  BP: 125/69  Pulse:   Temp: 36.4 C  Resp:     Complications: No apparent anesthesia complications

## 2015-02-18 NOTE — Transfer of Care (Signed)
Immediate Anesthesia Transfer of Care Note  Patient: [name]  Procedure(s) Performed: Procedure(s): INSERTION OF PORT A CATH RIGHT INTERNAL JUGULAR VEIN  (Left) CENTRAL VENOGRAM (Right)  Patient Location: PACU  Anesthesia Type:MAC  Level of Consciousness: awake, alert  and oriented  Airway & Oxygen Therapy: Patient Spontanous Breathing and Patient connected to nasal cannula oxygen  Post-op Assessment: Report given to RN and Post -op Vital signs reviewed and stable  Post vital signs: Reviewed and stable  Last Vitals:  Filed Vitals:   02/18/15 1200  BP: 125/69  Pulse:   Temp: 36.4 C  Resp:     Complications: No apparent anesthesia complications

## 2015-02-18 NOTE — Op Note (Signed)
OPERATIVE NOTE   PROCEDURE: 1. Left internal jugular vein cannulation under ultrasound guidance 2. Left central venogram 3. Right internal jugular vein cannulation under ultrasound guidance 4. Right internal jugular vein mediport placement  PRE-OPERATIVE DIAGNOSIS: recurrent multiple myeloma  POST-OPERATIVE DIAGNOSIS: same as above   SURGEON: Adele Barthel, MD  ANESTHESIA: local and MAC  ESTIMATED BLOOD LOSS: 50 cc  FINDING(S): 1.  Near occluded left proximal internal jugular vein  2.  Tip of mediport catheter in superior vena cava  3.  No obvious pneumothorax  SPECIMEN(S):  none  INDICATIONS:   [name] is a 69 y.o. male who presents with possible recurrent multiple myeloma.  His oncologist requested a left side mediport placement for chemotherapy.  The patient is aware the risks of mediport include but are not limited to: bleeding, infection, central venous injury, pneumothorax, possible venous stenosis, possible malpositioning in the venous system, and possible infections related to long-term catheter presence. The patient was aware of these risks and agreed to proceed.  .  DESCRIPTION: After obtaining full informed written consent, the patient was brought back to the operating room and placed supine upon the operating table.  The patient received IV antibiotics prior to induction.  After obtaining adequate anesthesia, the patient was prepped and draped in the standard fashion for: mediport placement.  I injected 20 cc of 1% lidocaine without epinephrine in the neck cannulation site and the left chest pocket for the mediport.  Under ultrasound guidance, I cannulated the left internal jugular vein with a microneedle.  I had concerns that this vein was possibly occluded due to the small caliber.  The microwire would not pass centrally.  I removed it and held pressure to the neck.  I then cannulated the left internal jugular vein with a 18 gauge needle under ultrasound  guidance.  I could get reasonable backbleeding but again no wire would track centrally.  I did a hand injection via the needle which demonstrated near occlusion of the left internal jugular vein.  I tried to redirect a microwire through the near occlusion, but it would not pass the near occlusion.  There was not enough length in the internal jugular vein to place a sheath, so I abandoned this attempt via the left internal jugular vein.    At this point, I tried to cannulate the left subclavian vein using anatomic landmarks, but after 3 attempts, I could not safely get underneath the clavicle.  I aborted any further attempts at cannulating the left subclavian vein.  At this point, I turned my attention to the right neck.  I injected another 30 cc of 1% lidocaine without epinephrine in the right neck cannulation site, tract of the mediport catheter and the pocket.  Under ultrasound guidance, I cannulated the right internal jugular vein.  I was able to easily pass the J-wire into the right atrium.  I clamped the wire in place and then made incisions in the neck cannulation site and then made an incision where I wanted the pocket.  I then dissected out a pocket with electrocautery.  There was more bleeding than usual due to the low platelet count in this patient, despite just being transfused platelets.  I placed a peel-away sheath over the wire into the right superior vena cava.  The mediport catheter was placed through the sheath and the sheath peeled away while holding the catheter in place.  Based on the imaging, I determined where I should cut this catheter.  I connected the distal end of this catheter to the tunneler and tunneled from the neck to the pocket on the chest.  I transected the catheter to appropriate length then placed the collar on the catheter.  I docked the port onto the catheter and locked it with the catheter.  I inserted the port into its pocket on the right chest.  Fluoroscopy demonstrated  some redundancy in the catheter, so I transected the extra length and then removed the collar.  I reloaded the collar on the catheter and redocked the port on the catheter.  The collar was pushed into place to lock the catheter to the port.  I replaced the port into its pocket.  Completion fluoroscopy demonstrated no further redundancy and position of the the catheter tip in distal superior vena cava.   I aspirated the port with a Huber needle and then flushed it with 20 cc of Heparinized saline.  I packed the pocket with some thrombin and gelfoam into the pocket.  After a few minutes, the patient appeared to be clotting.  I removed all the thrombin and gelfoam and washed out the pocket.  I secured the port with two 3-0 stitches through the mediport into the surrounding subcutaneous tissue.  I reapproximated the subcutaneous tissue with a running stitch of 3-0 Vicryl.  The skin was reapproximated with a running stitch of 4-0 Monocryl in a subcuticular fashion.   The skin was cleaned, dried, and reinforced with Dermabond.  The neck cannulation incision was closed with a U-stitch of 4-0 Monocryl.  The skin was cleaned, dried, and reinforced with Dermabond.   COMPLICATIONS: none  CONDITION: stable   Adele Barthel, MD [facility] Office: [phone] Pager: [phone]  02/18/2015, 11:51 AM

## 2015-02-19 ENCOUNTER — Telehealth: Payer: Self-pay | Admitting: Vascular Surgery

## 2015-02-19 ENCOUNTER — Encounter (HOSPITAL_COMMUNITY): Payer: Self-pay | Admitting: Vascular Surgery

## 2015-02-19 LAB — PREPARE PLATELET PHERESIS: Unit division: 0

## 2015-02-19 NOTE — Telephone Encounter (Signed)
Lm for pt re appt, dpm  °

## 2015-02-19 NOTE — Telephone Encounter (Signed)
-----   Message from [name], RN sent at 02/18/2015  2:05 PM EDT ----- Regarding: Schedule   ----- Message -----    From: Gregory [place], MD    Sent: 02/18/2015  12:08 PM      To: [facility] [phone] Oct 09, 1945  PROCEDURE: Left internal jugular vein cannulation under ultrasound guidance Left central venogram Right internal jugular vein cannulation under ultrasound guidance Right internal jugular vein mediport placement  Follow-up: 4 weeks

## 2015-02-22 ENCOUNTER — Ambulatory Visit (HOSPITAL_COMMUNITY): Payer: Medicare Other | Admitting: Oncology

## 2015-02-22 ENCOUNTER — Other Ambulatory Visit (HOSPITAL_COMMUNITY): Payer: Medicare Other

## 2015-02-23 ENCOUNTER — Emergency Department (HOSPITAL_COMMUNITY): Payer: Medicare Other

## 2015-02-23 ENCOUNTER — Observation Stay (HOSPITAL_COMMUNITY)
Admission: EM | Admit: 2015-02-23 | Discharge: 2015-02-25 | Disposition: A | Discharge status: Home / Self Care | Payer: Medicare Other | Attending: Internal Medicine | Admitting: Internal Medicine

## 2015-02-23 ENCOUNTER — Encounter (HOSPITAL_COMMUNITY): Payer: Self-pay | Admitting: Emergency Medicine

## 2015-02-23 DIAGNOSIS — K5900 Constipation, unspecified: Secondary | ICD-10-CM | POA: Diagnosis not present

## 2015-02-23 DIAGNOSIS — I10 Essential (primary) hypertension: Secondary | ICD-10-CM | POA: Diagnosis not present

## 2015-02-23 DIAGNOSIS — Z940 Kidney transplant status: Secondary | ICD-10-CM | POA: Insufficient documentation

## 2015-02-23 DIAGNOSIS — E559 Vitamin D deficiency, unspecified: Secondary | ICD-10-CM | POA: Diagnosis not present

## 2015-02-23 DIAGNOSIS — D500 Iron deficiency anemia secondary to blood loss (chronic): Secondary | ICD-10-CM | POA: Diagnosis not present

## 2015-02-23 DIAGNOSIS — Z8579 Personal history of other malignant neoplasms of lymphoid, hematopoietic and related tissues: Secondary | ICD-10-CM | POA: Insufficient documentation

## 2015-02-23 DIAGNOSIS — I120 Hypertensive chronic kidney disease with stage 5 chronic kidney disease or end stage renal disease: Secondary | ICD-10-CM | POA: Insufficient documentation

## 2015-02-23 DIAGNOSIS — N186 End stage renal disease: Secondary | ICD-10-CM | POA: Diagnosis not present

## 2015-02-23 DIAGNOSIS — N185 Chronic kidney disease, stage 5: Secondary | ICD-10-CM | POA: Insufficient documentation

## 2015-02-23 DIAGNOSIS — M5020 Other cervical disc displacement, unspecified cervical region: Secondary | ICD-10-CM | POA: Diagnosis not present

## 2015-02-23 DIAGNOSIS — R531 Weakness: Secondary | ICD-10-CM | POA: Diagnosis present

## 2015-02-23 DIAGNOSIS — D649 Anemia, unspecified: Secondary | ICD-10-CM | POA: Diagnosis present

## 2015-02-23 DIAGNOSIS — Z992 Dependence on renal dialysis: Secondary | ICD-10-CM | POA: Insufficient documentation

## 2015-02-23 DIAGNOSIS — D631 Anemia in chronic kidney disease: Secondary | ICD-10-CM | POA: Diagnosis present

## 2015-02-23 DIAGNOSIS — N189 Chronic kidney disease, unspecified: Secondary | ICD-10-CM | POA: Diagnosis not present

## 2015-02-23 LAB — CBC WITH DIFFERENTIAL/PLATELET
BASOS ABS: 0 10*3/uL (ref 0.0–0.1)
Basophils Relative: 1 %
Eosinophils Absolute: 0.1 10*3/uL (ref 0.0–0.7)
Eosinophils Relative: 6 %
HEMATOCRIT: 18.6 % — AB (ref 39.0–52.0)
HEMOGLOBIN: 6.3 g/dL — AB (ref 13.0–17.0)
LYMPHS ABS: 0.7 10*3/uL (ref 0.7–4.0)
LYMPHS PCT: 46 %
MCH: 37.5 pg — ABNORMAL HIGH (ref 26.0–34.0)
MCHC: 33.9 g/dL (ref 30.0–36.0)
MCV: 110.7 fL — ABNORMAL HIGH (ref 78.0–100.0)
Monocytes Absolute: 0.1 10*3/uL (ref 0.1–1.0)
Monocytes Relative: 9 %
NEUTROS ABS: 0.6 10*3/uL — AB (ref 1.7–7.7)
Neutrophils Relative %: 38 %
Platelets: 26 10*3/uL — CL (ref 150–400)
RBC: 1.68 MIL/uL — AB (ref 4.22–5.81)
RDW: 15.6 % — AB (ref 11.5–15.5)
WBC: 1.5 10*3/uL — AB (ref 4.0–10.5)

## 2015-02-23 LAB — BASIC METABOLIC PANEL
ANION GAP: 8 (ref 5–15)
BUN: 38 mg/dL — ABNORMAL HIGH (ref 6–20)
CALCIUM: 8.7 mg/dL — AB (ref 8.9–10.3)
CHLORIDE: 103 mmol/L (ref 101–111)
CO2: 27 mmol/L (ref 22–32)
Creatinine, Ser: 6.88 mg/dL — ABNORMAL HIGH (ref 0.61–1.24)
GFR calc non Af Amer: 7 mL/min — ABNORMAL LOW (ref >60)
GFR, EST AFRICAN AMERICAN: 9 mL/min — AB (ref >60)
Glucose, Bld: 124 mg/dL — ABNORMAL HIGH (ref 65–99)
POTASSIUM: 4.2 mmol/L (ref 3.5–5.1)
Sodium: 138 mmol/L (ref 135–145)

## 2015-02-23 LAB — PREPARE RBC (CROSSMATCH)

## 2015-02-23 LAB — TSH: TSH: 2.981 u[IU]/mL (ref 0.350–4.500)

## 2015-02-23 LAB — VITAMIN B12: Vitamin B-12: 341 pg/mL (ref 180–914)

## 2015-02-23 MED ORDER — OXYCODONE-ACETAMINOPHEN 5-325 MG PO TABS
1.0000 | ORAL_TABLET | Freq: Once | ORAL | Status: AC
  Administered 2015-02-23: 1 via ORAL
  Filled 2015-02-23: qty 2

## 2015-02-23 MED ORDER — ONDANSETRON HCL 4 MG/2ML IJ SOLN
4.0000 mg | Freq: Four times a day (QID) | INTRAMUSCULAR | Status: DC | PRN
  Filled 2015-02-23: qty 2

## 2015-02-23 MED ORDER — SODIUM CHLORIDE 0.9 % IJ SOLN: 3.0000 mL | INTRAMUSCULAR | Status: DC | PRN

## 2015-02-23 MED ORDER — OMEGA-3-ACID ETHYL ESTERS 1 G PO CAPS
1.0000 g | ORAL_CAPSULE | Freq: Every day | ORAL | Status: DC
  Administered 2015-02-24: 1 g via ORAL
  Filled 2015-02-23: qty 1

## 2015-02-23 MED ORDER — SODIUM CHLORIDE 0.9 % IJ SOLN
3.0000 mL | Freq: Two times a day (BID) | INTRAMUSCULAR | Status: DC
  Administered 2015-02-23 – 2015-02-24 (×3): 3 mL via INTRAVENOUS

## 2015-02-23 MED ORDER — CALCIUM ACETATE (PHOS BINDER) 667 MG PO CAPS
667.0000 mg | ORAL_CAPSULE | Freq: Three times a day (TID) | ORAL | Status: DC
  Administered 2015-02-23 – 2015-02-25 (×5): 2001 mg via ORAL
  Filled 2015-02-23 (×3): qty 3
  Filled 2015-02-23: qty 1
  Filled 2015-02-23: qty 2
  Filled 2015-02-23: qty 3

## 2015-02-23 MED ORDER — SODIUM CHLORIDE 0.9 % IV SOLN
Freq: Once | INTRAVENOUS | Status: AC
  Administered 2015-02-23: 16:00:00 via INTRAVENOUS

## 2015-02-23 MED ORDER — PREGABALIN 75 MG PO CAPS
75.0000 mg | ORAL_CAPSULE | Freq: Two times a day (BID) | ORAL | Status: DC
  Administered 2015-02-23 – 2015-02-24 (×3): 75 mg via ORAL
  Filled 2015-02-23 (×3): qty 1

## 2015-02-23 MED ORDER — ONDANSETRON HCL 4 MG PO TABS: 4.0000 mg | ORAL_TABLET | Freq: Four times a day (QID) | ORAL | Status: DC | PRN

## 2015-02-23 MED ORDER — LENALIDOMIDE 5 MG PO CAPS: 5.0000 mg | ORAL_CAPSULE | ORAL | Status: DC

## 2015-02-23 MED ORDER — SODIUM CHLORIDE 0.9 % IV SOLN
Freq: Once | INTRAVENOUS | Status: AC
  Administered 2015-02-23: 17:00:00 via INTRAVENOUS

## 2015-02-23 MED ORDER — SENNOSIDES-DOCUSATE SODIUM 8.6-50 MG PO TABS: 1.0000 | ORAL_TABLET | Freq: Every evening | ORAL | Status: DC | PRN

## 2015-02-23 MED ORDER — ACETAMINOPHEN 325 MG PO TABS: 650.0000 mg | ORAL_TABLET | Freq: Four times a day (QID) | ORAL | Status: DC | PRN

## 2015-02-23 MED ORDER — SODIUM CHLORIDE 0.9 % IV SOLN: 250.0000 mL | INTRAVENOUS | Status: DC | PRN

## 2015-02-23 MED ORDER — TORSEMIDE 20 MG PO TABS
40.0000 mg | ORAL_TABLET | Freq: Every day | ORAL | Status: DC
  Administered 2015-02-24: 40 mg via ORAL
  Filled 2015-02-23: qty 2

## 2015-02-23 MED ORDER — DOCUSATE SODIUM 100 MG PO CAPS: 100.0000 mg | ORAL_CAPSULE | Freq: Every day | ORAL | Status: DC

## 2015-02-23 MED ORDER — DOCUSATE SODIUM 100 MG PO CAPS
100.0000 mg | ORAL_CAPSULE | Freq: Two times a day (BID) | ORAL | Status: DC
  Filled 2015-02-23 (×3): qty 1

## 2015-02-23 MED ORDER — ACETAMINOPHEN 650 MG RE SUPP: 650.0000 mg | Freq: Four times a day (QID) | RECTAL | Status: DC | PRN

## 2015-02-23 NOTE — H&P (Addendum)
[facility]          History and Physical    PCP:   No PCP Per Patient   EDP: Milton Ferguson, MD  Chief Complaint:  Weakness  HPI: 69 y/o man c/o severe fatigue and weakness for well over a week. PMH significant fir ESRD on HD TTS, HTN, MM. Says "everything has gone down hill since I started dialysis". Denies SOB, dizziness or lightheadedness. No CP. Was found to have a Hb of 6.8. He is due for HD today. We have been asked to admit him for further evaluation and management.  Allergies:   Allergies  Allergen Reactions  . Pamidronate Anaphylaxis    blood pressure       Past Medical History  Diagnosis Date  . Hypertension   . Ruptured cervical disc   . Fall resulting in striking against other object     paralyzed  . Multiple myeloma   . Recurrent multiple myeloma of bone marrow with unknown EBV status ([place])   . Multiple myeloma ([place]) 01/26/2011  . Unspecified vitamin D deficiency 03/06/2013  . Anemia of chronic disease 12/30/2013  . Constipation   . Dialysis patient ([place]) 2016  . Thrombocytopenia ([place]) 08/31/2014  . 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 Constipation     Past Surgical History  Procedure Laterality Date  . Inguinal hernia repair Bilateral   . Anterior cervical decomp/discectomy fusion    . Bone marrow aspirate and biopsy wiith lumbar puncture    . Melanoma excision      rt. breast  . Cervical spine surgery    . Kyphoplasty Bilateral 01/30/2014    Procedure: Thoracic eleven Kyphoplasty;  Surgeon: Consuella Lose, MD;  Location: [facility];  Service: Neurosurgery;  Laterality: Bilateral;  Thoracic eleven Kyphoplasty  . Av fistula placement Right 04/30/2014    Procedure: BRACHIOCEPHALIC ARTERIOVENOUS (AV) FISTULA CREATION;  Surgeon: Conrad Lemont Furnace, MD;  Location: [facility];  Service: Vascular;  Laterality: Right;  . Portacath  placement Left 02/18/2015    Procedure: INSERTION OF PORT A CATH RIGHT INTERNAL JUGULAR VEIN ;  Surgeon: Conrad Ellisville, MD;  Location: [facility];  Service: Vascular;  Laterality: Left;    Prior to Admission medications   Medication Sig Start Date End Date Taking? Authorizing Provider  albuterol (PROVENTIL) (2.5 MG/3ML) 0.083% nebulizer solution Take 3 mLs (2.5 mg total) by nebulization every 6 (six) hours as needed for wheezing or shortness of breath. 09/28/14  Yes Baird Cancer, PA-C  calcium acetate (PHOSLO) 667 MG capsule Take 667-2,001 mg by mouth 4 (four) times daily. Take 3 capsules three times daily with meals and 1 capsule with snacks   Yes Historical Provider, MD  docusate sodium (COLACE) 100 MG capsule Take 100 mg by mouth daily.    Yes Historical Provider, MD  lenalidomide (REVLIMID) 5 MG capsule Take one tablet every other day, after dialysis 02/12/15  Yes Baird Cancer, PA-C  Omega-3 Fatty Acids (FISH OIL) 1000 MG CAPS Take 2 capsules by mouth daily.   Yes Historical Provider, MD  oxyCODONE-acetaminophen (ROXICET) 5-325 MG tablet Take 1-2 tablets by mouth every 6 (six) hours as needed for moderate pain. 02/18/15  Yes Conrad Woodworth, MD  pregabalin (LYRICA) 75 MG capsule Take 1 capsule (75 mg total) by mouth 2 (  two) times daily. 09/21/14  Yes Manon Hilding Kefalas, PA-C  torsemide (DEMADEX) 20 MG tablet Take 40 mg by mouth daily.   Yes Historical Provider, MD    Social History:  reports that he has never smoked. He has never used smokeless tobacco. He reports that he does not drink alcohol or use illicit drugs.  Family History  Problem Relation Age of Onset  . Cancer Sister   . Cancer Brother     Review of Systems:  Constitutional: Denies fever, chills, diaphoresis, appetite change. HEENT: Denies photophobia, eye pain, redness, hearing loss, ear pain, congestion, sore throat, rhinorrhea, sneezing, mouth sores, trouble swallowing, neck pain, neck stiffness and tinnitus.   Respiratory:  Denies SOB, DOE, cough, chest tightness,  and wheezing.   Cardiovascular: Denies chest pain, palpitations and leg swelling.  Gastrointestinal: Denies nausea, vomiting, abdominal pain, diarrhea, constipation, blood in stool and abdominal distention.  Genitourinary: Denies dysuria, urgency, frequency, hematuria, flank pain and difficulty urinating.  Endocrine: Denies: hot or cold intolerance, sweats, changes in hair or nails, polyuria, polydipsia. Musculoskeletal: Denies myalgias, back pain, joint swelling, arthralgias and gait problem.  Skin: Denies pallor, rash and wound.  Neurological: Denies dizziness, seizures, syncope,  light-headedness, numbness and headaches.  Hematological: Denies adenopathy. Easy bruising, personal or family bleeding history  Psychiatric/Behavioral: Denies suicidal ideation, mood changes, confusion, nervousness, sleep disturbance and agitation   Physical Exam: Blood pressure 155/77, pulse 72, temperature 98.5 F (36.9 C), temperature source Oral, resp. rate 16, height 5' 11"  (1.803 m), weight 108.41 kg (239 lb), SpO2 94 %. Gen: AA Ox3 HEENT: [name]/AT/PERRL/EOMI/pale conjunctivae Neck: supple, no JVD, no LAD, no bruits no goiter CV: RRR, no M/R/G Lungs: CTA B Abdomen: S/NT/ND/+BS Ext: no C/C/E, changes of chronic venous insufficiency Neuro: grossly intact and non-focal  Labs on Admission:  Results for orders placed or performed during the hospital encounter of 02/23/15 (from the past 48 hour(s))  CBC with Differential/Platelet     Status: Abnormal   Collection Time: 02/23/15 10:15 AM  Result Value Ref Range   WBC 1.5 (L) 4.0 - 10.5 K/uL   RBC 1.68 (L) 4.22 - 5.81 MIL/uL   Hemoglobin 6.3 (LL) 13.0 - 17.0 g/dL    Comment: CRITICAL RESULT CALLED TO, READ BACK BY AND VERIFIED WITH: VOGLER, T. AT 1100 ON 02/23/2015 BY AGUNDIZ, E    HCT 18.6 (L) 39.0 - 52.0 %   MCV 110.7 (H) 78.0 - 100.0 fL   MCH 37.5 (H) 26.0 - 34.0 pg   MCHC 33.9 30.0 - 36.0 g/dL   RDW 15.6 (H)  11.5 - 15.5 %   Platelets 26 (LL) 150 - 400 K/uL    Comment: CRITICAL RESULT CALLED TO, READ BACK BY AND VERIFIED WITH: VOGLER, T. AT 1100 ON 02/23/2015 BY AGUNDIZ, E    Neutrophils Relative % 38 %   Neutro Abs 0.6 (L) 1.7 - 7.7 K/uL   Lymphocytes Relative 46 %   Lymphs Abs 0.7 0.7 - 4.0 K/uL   Monocytes Relative 9 %   Monocytes Absolute 0.1 0.1 - 1.0 K/uL   Eosinophils Relative 6 %   Eosinophils Absolute 0.1 0.0 - 0.7 K/uL   Basophils Relative 1 %   Basophils Absolute 0.0 0.0 - 0.1 K/uL   RBC Morphology POLYCHROMASIA PRESENT   Basic metabolic panel     Status: Abnormal   Collection Time: 02/23/15 10:15 AM  Result Value Ref Range   Sodium 138 135 - 145 mmol/L   Potassium 4.2 3.5 - 5.1 mmol/L  Chloride 103 101 - 111 mmol/L   CO2 27 22 - 32 mmol/L   Glucose, Bld 124 (H) 65 - 99 mg/dL   BUN 38 (H) 6 - 20 mg/dL   Creatinine, Ser 6.88 (H) 0.61 - 1.24 mg/dL   Calcium 8.7 (L) 8.9 - 10.3 mg/dL   GFR calc non Af Amer 7 (L) >60 mL/min   GFR calc Af Amer 9 (L) >60 mL/min    Comment: (NOTE) The eGFR has been calculated using the CKD EPI equation. This calculation has not been validated in all clinical situations. eGFR's persistently <60 mL/min signify possible Chronic Kidney Disease.    Anion gap 8 5 - 15  Type and screen     Status: None (Preliminary result)   Collection Time: 02/23/15 10:15 AM  Result Value Ref Range   ABO/RH(D) A POS    Antibody Screen NEG    Sample Expiration 02/26/2015    Unit Number [id number]    Blood Component Type RBC, LR IRR    Unit division 00    Status of Unit ISSUED    Transfusion Status OK TO TRANSFUSE    Crossmatch Result Compatible    Unit Number [id number]    Blood Component Type RBC, LR IRR    Unit division 00    Status of Unit ALLOCATED    Transfusion Status OK TO TRANSFUSE    Crossmatch Result Compatible   Prepare RBC     Status: None   Collection Time: 02/23/15 10:15 AM  Result Value Ref Range   Order Confirmation ORDER  PROCESSED BY BLOOD BANK     Radiological Exams on Admission: Dg Chest Portable 1 View  02/23/2015   CLINICAL DATA:  Generalized weakness x 1 week. Port a cath placed 6 days ago. Hx of multiple myeloma,anemia,CKD stage 5, dialysis 3 x weekly./bbj  EXAM: PORTABLE CHEST - 1 VIEW  COMPARISON:  02/18/2015  FINDINGS: Relatively low lung volumes with some crowding of perihilar and bibasilar bronchovascular structures left greater than right. Cervical fixation hardware partially visualized. Mild cardiomegaly. No pneumothorax. Stable right IJ port catheter. Vascular stent in the right axilla. Sclerotic right seventh rib lesion as before. Changes of lower thoracic kyphoplasty/ vertebroplasty. No effusion.  IMPRESSION: 1. Stable cardiomegaly, chronic and postoperative changes.   Electronically Signed   By: Lucrezia Europe M.D.   On: 02/23/2015 10:53    Assessment/Plan Principal Problem:   Anemia in chronic kidney disease Active Problems:   Essential hypertension, benign   Anemia   Weakness generalized   ESRD on dialysis (HCC)   Anemia, chronic renal failure    Generalized Weakness -Suspect related to severe anemia with Hb of 6.8. -Will order transfusion of 2 units of PRBCs. -Will order TSH and B12 for completeness sake. -Will request PT eval.  Anemia of CKD -Hb 6.8. -No signs of active blood loss. -As above.  ESRD -Due for HD. -Have requested nephrology assistance for HD.  HTN -Continue home meds.  MM -Continue Revlimed and OP follow up with oncology.  DVT Proph -SCDs  Code Status -Full Code   Time Spent on Admission: 80 minutes  HERNANDEZ ACOSTA,[name] [facility] Pager: [phone] 02/23/2015, 4:18 PM

## 2015-02-23 NOTE — ED Provider Notes (Signed)
CSN: [id number]     Arrival date & time 02/23/15  5188 History  By signing my name below, I, [name], attest that this documentation has been prepared under the direction and in the presence of Gregory Ferguson, MD. Electronically Signed: Hilda [name], ED Scribe. 02/23/2015. 10:08 AM  Chief Complaint  Patient presents with  . Weakness      Patient is a 69 y.o. male presenting with weakness. The history is provided by the patient. No language interpreter was used.  Weakness This is a new problem. The current episode started more than 2 days ago. The problem occurs constantly. The problem has not changed since onset.Pertinent negatives include no chest pain, no abdominal pain, no headaches and no shortness of breath. Exacerbated by: dialysis treatment.   HPI Comments: [name] is a 69 y.o. male who presents to the Emergency Department complaining of constant generalized weakness that has been present for around 4-5 days. Pt states that he receives dialysis 3 days per week, and states that the past two times he has done the treatment that his blood clotted, and the treatment had to be stopped. Pt states that he feels his weakness is coming from losing so much blood the past few times he has done dialysis. Pt states he has not received any blood from the place that does his dialysis treatment. Pt denies fever, and states that he has been cold lately, but denies chills.     Past Medical History  Diagnosis Date  . Hypertension   . Ruptured cervical disc   . Fall resulting in striking against other object     paralyzed  . Multiple myeloma   . Recurrent multiple myeloma of bone marrow with unknown EBV status ([place])   . Multiple myeloma ([place]) 01/26/2011  . Unspecified vitamin D deficiency 03/06/2013  . Anemia of chronic disease 12/30/2013  . Constipation   . Dialysis patient ([place]) 2016  . Thrombocytopenia ([place]) 08/31/2014  . Chronic renal disease, stage 5, glomerular filtration rate  less than or equal to 15 mL/min/1.73 square meter ([facility]CC) 12/30/2013    T/TH/Sat dialysis  . Constipation    Past Surgical History  Procedure Laterality Date  . Inguinal hernia repair Bilateral   . Anterior cervical decomp/discectomy fusion    . Bone marrow aspirate and biopsy wiith lumbar puncture    . Melanoma excision      rt. breast  . Cervical spine surgery    . Kyphoplasty Bilateral 01/30/2014    Procedure: Thoracic eleven Kyphoplasty;  Surgeon: Consuella Lose, MD;  Location: [facility];  Service: Neurosurgery;  Laterality: Bilateral;  Thoracic eleven Kyphoplasty  . Av fistula placement Right 04/30/2014    Procedure: BRACHIOCEPHALIC ARTERIOVENOUS (AV) FISTULA CREATION;  Surgeon: Conrad Storden, MD;  Location: [facility] OR;  Service: Vascular;  Laterality: Right;  . Portacath placement Left 02/18/2015    Procedure: INSERTION OF PORT A CATH RIGHT INTERNAL JUGULAR VEIN ;  Surgeon: Conrad [place], MD;  Location: [facility];  Service: Vascular;  Laterality: Left;   Family History  Problem Relation Age of Onset  . Cancer Sister   . Cancer Brother    Social History  Substance Use Topics  . Smoking status: Never Smoker   . Smokeless tobacco: Never Used  . Alcohol Use: No    Review of Systems  Constitutional: Negative for appetite change and fatigue.  HENT: Negative for congestion, ear discharge and sinus pressure.   Eyes: Negative for discharge.  Respiratory: Negative  for cough and shortness of breath.   Cardiovascular: Negative for chest pain.  Gastrointestinal: Negative for abdominal pain and diarrhea.  Genitourinary: Negative for frequency and hematuria.  Musculoskeletal: Negative for back pain.  Skin: Negative for rash.  Neurological: Positive for weakness. Negative for seizures and headaches.  Psychiatric/Behavioral: Negative for hallucinations.      Allergies  Pamidronate  Home Medications   Prior to Admission medications   Medication Sig Start Date End Date Taking?  Authorizing Provider  albuterol (PROVENTIL) (2.5 MG/3ML) 0.083% nebulizer solution Take 3 mLs (2.5 mg total) by nebulization every 6 (six) hours as needed for wheezing or shortness of breath. 09/28/14   Baird Cancer, PA-C  docusate sodium (COLACE) 100 MG capsule Take 100 mg by mouth daily.     Historical Provider, MD  lenalidomide (REVLIMID) 5 MG capsule Take one tablet every other day, after dialysis 02/12/15   Baird Cancer, PA-C  Omega-3 Fatty Acids (FISH OIL) 1000 MG CAPS Take 2 capsules by mouth daily.    Historical Provider, MD  oxyCODONE-acetaminophen (ROXICET) 5-325 MG tablet Take 1-2 tablets by mouth every 6 (six) hours as needed for moderate pain. 02/18/15   Conrad , MD  pregabalin (LYRICA) 75 MG capsule Take 1 capsule (75 mg total) by mouth 2 (two) times daily. 09/21/14   Baird Cancer, PA-C  torsemide (DEMADEX) 20 MG tablet Take 40 mg by mouth daily.    Historical Provider, MD   BP 163/77 mmHg  Pulse 66  Temp(Src) 99.2 F (37.3 C)  Resp 20  Ht 5' 11"  (1.803 m)  Wt 239 lb (108.41 kg)  BMI 33.35 kg/m2  SpO2 99% Physical Exam  Constitutional: He is oriented to person, place, and time. He appears well-developed.  HENT:  Head: Normocephalic.  Eyes: Conjunctivae and EOM are normal. No scleral icterus.  Neck: Neck supple. No thyromegaly present.  Cardiovascular: Normal rate and regular rhythm.  Exam reveals no gallop and no friction rub.   No murmur heard. Pulmonary/Chest: No stridor. He has no wheezes. He has no rales. He exhibits no tenderness.  Abdominal: He exhibits no distension. There is no tenderness. There is no rebound.  Musculoskeletal: Normal range of motion. He exhibits no edema.  Lymphadenopathy:    He has no cervical adenopathy.  Neurological: He is oriented to person, place, and time. He exhibits normal muscle tone. Coordination normal.  Skin: No rash noted. No erythema.  Port catheter in right chest that is healing Dialysis graft in his right arm    Psychiatric: He has a normal mood and affect. His behavior is normal.    ED Course  Procedures (including critical care time)  DIAGNOSTIC STUDIES: Oxygen Saturation is 99% on room air, normal by my interpretation.    COORDINATION OF CARE: 10:05 AM Discussed treatment plan with pt at bedside and pt agreed to plan.   Labs Review Labs Reviewed - No data to display  Imaging Review No results found. I have personally reviewed and evaluated these images and lab results as part of my medical decision-making.   EKG Interpretation None      MDM   Final diagnoses:  None   Anemia,  Renal failure,  Admit for transfusion  The chart was scribed for me under my direct supervision.  I personally performed the history, physical, and medical decision making and all procedures in the evaluation of this patient.Gregory Ferguson, MD 02/23/15 1150

## 2015-02-23 NOTE — Progress Notes (Signed)
Patient complaining of pain, paged on-call MD, will follow new orders received and continue to monitor the patient.

## 2015-02-23 NOTE — ED Notes (Signed)
HGB-6.3, Plt-26. Dr. Roderic Palau notified.

## 2015-02-23 NOTE — ED Notes (Signed)
Spoke with nurse from [place] dialysis, states they have been having difficulty with the patient's blood clotting during dialysis. Patient just feels tired and feels they are discarding a large amount of his blood at dialysis during treatment.

## 2015-02-23 NOTE — Progress Notes (Signed)
Per MD, patient is to receive two units of irradiated PRBCs tonight.

## 2015-02-23 NOTE — ED Notes (Signed)
Pt c/o generalized weakness x 1 week.

## 2015-02-24 DIAGNOSIS — Z992 Dependence on renal dialysis: Secondary | ICD-10-CM | POA: Diagnosis not present

## 2015-02-24 DIAGNOSIS — D631 Anemia in chronic kidney disease: Secondary | ICD-10-CM | POA: Diagnosis not present

## 2015-02-24 DIAGNOSIS — D500 Iron deficiency anemia secondary to blood loss (chronic): Secondary | ICD-10-CM | POA: Diagnosis not present

## 2015-02-24 DIAGNOSIS — N186 End stage renal disease: Secondary | ICD-10-CM | POA: Diagnosis not present

## 2015-02-24 DIAGNOSIS — N189 Chronic kidney disease, unspecified: Secondary | ICD-10-CM | POA: Diagnosis not present

## 2015-02-24 LAB — TYPE AND SCREEN
ABO/RH(D): A POS
Antibody Screen: NEGATIVE
UNIT DIVISION: 0
Unit division: 0

## 2015-02-24 LAB — RENAL FUNCTION PANEL
Albumin: 2.9 g/dL — ABNORMAL LOW (ref 3.5–5.0)
Anion gap: 9 (ref 5–15)
BUN: 54 mg/dL — ABNORMAL HIGH (ref 6–20)
CHLORIDE: 105 mmol/L (ref 101–111)
CO2: 23 mmol/L (ref 22–32)
CREATININE: 8.36 mg/dL — AB (ref 0.61–1.24)
Calcium: 10.2 mg/dL (ref 8.9–10.3)
GFR calc Af Amer: 7 mL/min — ABNORMAL LOW (ref >60)
GFR calc non Af Amer: 6 mL/min — ABNORMAL LOW (ref >60)
GLUCOSE: 113 mg/dL — AB (ref 65–99)
Phosphorus: 4.9 mg/dL — ABNORMAL HIGH (ref 2.5–4.6)
Potassium: 4.4 mmol/L (ref 3.5–5.1)
SODIUM: 137 mmol/L (ref 135–145)

## 2015-02-24 LAB — CBC
HCT: 24 % — ABNORMAL LOW (ref 39.0–52.0)
Hemoglobin: 8.3 g/dL — ABNORMAL LOW (ref 13.0–17.0)
MCH: 35.8 pg — AB (ref 26.0–34.0)
MCHC: 34.6 g/dL (ref 30.0–36.0)
MCV: 103.4 fL — AB (ref 78.0–100.0)
Platelets: 27 10*3/uL — CL (ref 150–400)
RBC: 2.32 MIL/uL — ABNORMAL LOW (ref 4.22–5.81)
RDW: 20 % — ABNORMAL HIGH (ref 11.5–15.5)
WBC: 2.3 10*3/uL — ABNORMAL LOW (ref 4.0–10.5)

## 2015-02-24 MED ORDER — SODIUM CHLORIDE 0.9 % IV SOLN: 100.0000 mL | INTRAVENOUS | Status: DC | PRN

## 2015-02-24 MED ORDER — PENTAFLUOROPROP-TETRAFLUOROETH EX AERO
INHALATION_SPRAY | CUTANEOUS | Status: AC
  Administered 2015-02-24: 1 via TOPICAL
  Filled 2015-02-24: qty 103.5

## 2015-02-24 MED ORDER — OXYCODONE-ACETAMINOPHEN 5-325 MG PO TABS
1.0000 | ORAL_TABLET | Freq: Once | ORAL | Status: AC
  Administered 2015-02-24: 1 via ORAL
  Filled 2015-02-24: qty 1

## 2015-02-24 MED ORDER — PENTAFLUOROPROP-TETRAFLUOROETH EX AERO
1.0000 "application " | INHALATION_SPRAY | CUTANEOUS | Status: DC | PRN
  Administered 2015-02-24: 1 via TOPICAL

## 2015-02-24 MED ORDER — LIDOCAINE-PRILOCAINE 2.5-2.5 % EX CREA
TOPICAL_CREAM | Freq: Once | CUTANEOUS | Status: AC
  Administered 2015-02-24: 09:00:00 via TOPICAL
  Filled 2015-02-24: qty 5

## 2015-02-24 MED ORDER — HEPARIN SODIUM (PORCINE) 1000 UNIT/ML DIALYSIS
1000.0000 [IU] | INTRAMUSCULAR | Status: DC | PRN
  Filled 2015-02-24: qty 1

## 2015-02-24 MED ORDER — LIDOCAINE HCL (PF) 1 % IJ SOLN: 5.0000 mL | INTRAMUSCULAR | Status: DC | PRN

## 2015-02-24 MED ORDER — LIDOCAINE-PRILOCAINE 2.5-2.5 % EX CREA: 1.0000 "application " | TOPICAL_CREAM | CUTANEOUS | Status: DC | PRN

## 2015-02-24 MED ORDER — ALTEPLASE 2 MG IJ SOLR
2.0000 mg | Freq: Once | INTRAMUSCULAR | Status: DC | PRN
  Filled 2015-02-24: qty 2

## 2015-02-24 NOTE — Discharge Instructions (Signed)
Anemia, Nonspecific Anemia is a condition in which the concentration of red blood cells or hemoglobin in the blood is below normal. Hemoglobin is a substance in red blood cells that carries oxygen to the tissues of the body. Anemia results in not enough oxygen reaching these tissues.  CAUSES  Common causes of anemia include:   Excessive bleeding. Bleeding may be internal or external. This includes excessive bleeding from periods (in women) or from the intestine.   Poor nutrition.   Chronic kidney, thyroid, and liver disease.  Bone marrow disorders that decrease red blood cell production.  Cancer and treatments for cancer.  HIV, AIDS, and their treatments.  Spleen problems that increase red blood cell destruction.  Blood disorders.  Excess destruction of red blood cells due to infection, medicines, and autoimmune disorders. SIGNS AND SYMPTOMS   Minor weakness.   Dizziness.   Headache.  Palpitations.   Shortness of breath, especially with exercise.   Paleness.  Cold sensitivity.  Indigestion.  Nausea.  Difficulty sleeping.  Difficulty concentrating. Symptoms may occur suddenly or they may develop slowly.  DIAGNOSIS  Additional blood tests are often needed. These help your health care provider determine the best treatment. Your health care provider will check your stool for blood and look for other causes of blood loss.  TREATMENT  Treatment varies depending on the cause of the anemia. Treatment can include:   Supplements of iron, vitamin B12, or folic acid.   Hormone medicines.   A blood transfusion. This may be needed if blood loss is severe.   Hospitalization. This may be needed if there is significant continual blood loss.   Dietary changes.  Spleen removal. HOME CARE INSTRUCTIONS Keep all follow-up appointments. It often takes many weeks to correct anemia, and having your health care provider check on your condition and your response to  treatment is very important. SEEK IMMEDIATE MEDICAL CARE IF:   You develop extreme weakness, shortness of breath, or chest pain.   You become dizzy or have trouble concentrating.  You develop heavy vaginal bleeding.   You develop a rash.   You have bloody or black, tarry stools.   You faint.   You vomit up blood.   You vomit repeatedly.   You have abdominal pain.  You have a fever or persistent symptoms for more than 2-3 days.   You have a fever and your symptoms suddenly get worse.   You are dehydrated.  MAKE SURE YOU:  Understand these instructions.  Will watch your condition.  Will get help right away if you are not doing well or get worse.   This information is not intended to replace advice given to you by your health care provider. Make sure you discuss any questions you have with your health care provider.   Document Released: 06/11/2004 Document Revised: 01/04/2013 Document Reviewed: 10/28/2012 Elsevier Interactive Patient Education 2016 [employer]  

## 2015-02-24 NOTE — Procedures (Signed)
HEMODIALYSIS TREATMENT NOTE:  3.5 hour heparin-free dialysis completed via right upper arm AVF (16g/antegrade). Goal NOT met: BP unable to tolerate removal of 2.5 liters as ordered. Ultrafiltration was interrupted x 30 minutes due to hypotension relieved with NS administration. All blood was reinfused. Hemostasis was achieved within 15 minutes. Report given to Stann Mainland, RN.  Rockwell Alexandria, RN, CDN

## 2015-02-24 NOTE — Progress Notes (Signed)
CRITICAL VALUE ALERT  Critical value received:  Platelet Count 27000  Date of notification:  02/24/2015  Time of notification:  5750  Critical value read back:Yes.    Nurse who received alert:  Cecilie Kicks, RN  MD notified (1st page):  Dr. Jerilee Hoh  Time of first page:  1635  MD notified (2nd page):  Time of second page:  Responding MD:  Dr. Jerilee Hoh    Time MD responded:  [phone]

## 2015-02-24 NOTE — Progress Notes (Signed)
Patient has discharge orders but is refusing discharge. Also patient is c/o pain and there is nothing ordered for pain. Paged the on-call MD, will follow orders received and continue to monitor the patient.

## 2015-02-24 NOTE — Consult Note (Signed)
Reason for Consult: End-stage renal disease Referring Physician: Dr. Raymondo Band is an 69 y.o. male.  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 Patient regular dialysis is Tuesday, Thursday and Saturday. Since his access was clotted patient was sent to vascular surgery on Thursday and missed his dialysis. Patient came to the dialysis unit on Friday after 2 in after hours of dialysis the line was clotted hence his dialysis was discontinued and sent home. Presently patient has received 2 units of packed red blood cells and feels better. He denies any nausea or vomiting.  Past Medical History  Diagnosis Date  . Hypertension   . Ruptured cervical disc   . Fall resulting in striking against other object     paralyzed  . Multiple myeloma   . Recurrent multiple myeloma of bone marrow with unknown EBV status ([place])   . Multiple myeloma ([place]) 01/26/2011  . Unspecified vitamin D deficiency 03/06/2013  . Anemia of chronic disease 12/30/2013  . Constipation   . Dialysis patient ([place]) 2016  . Thrombocytopenia ([place]) 08/31/2014  . 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 Constipation     Past Surgical History  Procedure Laterality Date  . Inguinal hernia repair Bilateral   . Anterior cervical decomp/discectomy fusion    . Bone marrow aspirate and biopsy wiith lumbar puncture    . Melanoma excision      rt. breast  . Cervical spine surgery    . Kyphoplasty Bilateral 01/30/2014    Procedure: Thoracic eleven Kyphoplasty;  Surgeon: Consuella Lose, MD;  Location: [facility];  Service: Neurosurgery;  Laterality: Bilateral;  Thoracic eleven Kyphoplasty  . Av  fistula placement Right 04/30/2014    Procedure: BRACHIOCEPHALIC ARTERIOVENOUS (AV) FISTULA CREATION;  Surgeon: Conrad [place], MD;  Location: [facility] OR;  Service: Vascular;  Laterality: Right;  . Portacath placement Left 02/18/2015    Procedure: INSERTION OF PORT A CATH RIGHT INTERNAL JUGULAR VEIN ;  Surgeon: Conrad [place], MD;  Location: [place];  Service: Vascular;  Laterality: Left;    Family History  Problem Relation Age of Onset  . Cancer Sister   . Cancer Brother     Social History:  reports that he has never smoked. He has never used smokeless tobacco. He reports that he does not drink alcohol or use illicit drugs.  Allergies:  Allergies  Allergen Reactions  . Pamidronate Anaphylaxis    blood pressure     Medications: I have reviewed the patient's current medications.  Results for orders placed or performed during the hospital encounter of 02/23/15 (from the past 48 hour(s))  CBC with Differential/Platelet     Status: Abnormal   Collection Time: 02/23/15 10:15 AM  Result Value Ref Range   WBC 1.5 (L) 4.0 - 10.5 K/uL   RBC 1.68 (L) 4.22 - 5.81 MIL/uL   Hemoglobin 6.3 (LL) 13.0 - 17.0 g/dL    Comment: CRITICAL RESULT CALLED TO, READ BACK BY AND VERIFIED WITH: VOGLER, T. AT 1100 ON 02/23/2015 BY AGUNDIZ, E    HCT 18.6 (L) 39.0 - 52.0 %   MCV 110.7 (H) 78.0 - 100.0 fL   MCH 37.5 (H) 26.0 - 34.0  pg   MCHC 33.9 30.0 - 36.0 g/dL   RDW 15.6 (H) 11.5 - 15.5 %   Platelets 26 (LL) 150 - 400 K/uL    Comment: CRITICAL RESULT CALLED TO, READ BACK BY AND VERIFIED WITH: VOGLER, T. AT 1100 ON 02/23/2015 BY AGUNDIZ, E    Neutrophils Relative % 38 %   Neutro Abs 0.6 (L) 1.7 - 7.7 K/uL   Lymphocytes Relative 46 %   Lymphs Abs 0.7 0.7 - 4.0 K/uL   Monocytes Relative 9 %   Monocytes Absolute 0.1 0.1 - 1.0 K/uL   Eosinophils Relative 6 %   Eosinophils Absolute 0.1 0.0 - 0.7 K/uL   Basophils Relative 1 %   Basophils Absolute 0.0 0.0 - 0.1 K/uL   RBC Morphology POLYCHROMASIA PRESENT   Basic  metabolic panel     Status: Abnormal   Collection Time: 02/23/15 10:15 AM  Result Value Ref Range   Sodium 138 135 - 145 mmol/L   Potassium 4.2 3.5 - 5.1 mmol/L   Chloride 103 101 - 111 mmol/L   CO2 27 22 - 32 mmol/L   Glucose, Bld 124 (H) 65 - 99 mg/dL   BUN 38 (H) 6 - 20 mg/dL   Creatinine, Ser 6.88 (H) 0.61 - 1.24 mg/dL   Calcium 8.7 (L) 8.9 - 10.3 mg/dL   GFR calc non Af Amer 7 (L) >60 mL/min   GFR calc Af Amer 9 (L) >60 mL/min    Comment: (NOTE) The eGFR has been calculated using the CKD EPI equation. This calculation has not been validated in all clinical situations. eGFR's persistently <60 mL/min signify possible Chronic Kidney Disease.    Anion gap 8 5 - 15  Type and screen     Status: None   Collection Time: 02/23/15 10:15 AM  Result Value Ref Range   ABO/RH(D) A POS    Antibody Screen NEG    Sample Expiration 02/26/2015    Unit Number [id number]    Blood Component Type RBC, LR IRR    Unit division 00    Status of Unit ISSUED,FINAL    Transfusion Status OK TO TRANSFUSE    Crossmatch Result Compatible    Unit Number [id number]    Blood Component Type RBC, LR IRR    Unit division 00    Status of Unit ISSUED,FINAL    Transfusion Status OK TO TRANSFUSE    Crossmatch Result Compatible   Prepare RBC     Status: None   Collection Time: 02/23/15 10:15 AM  Result Value Ref Range   Order Confirmation ORDER PROCESSED BY BLOOD BANK   TSH     Status: None   Collection Time: 02/23/15 10:15 AM  Result Value Ref Range   TSH 2.981 0.350 - 4.500 uIU/mL  Prepare RBC     Status: None   Collection Time: 02/23/15 10:15 AM  Result Value Ref Range   Order Confirmation ORDER PROCESSED BY BLOOD BANK   Vitamin B12     Status: None   Collection Time: 02/23/15 10:15 AM  Result Value Ref Range   Vitamin B-12 341 180 - 914 pg/mL    Comment: (NOTE) This assay is not validated for testing neonatal or myeloproliferative syndrome specimens for Vitamin B12 levels. Performed at  [facility]     Dg Chest Portable 1 View  02/23/2015   CLINICAL DATA:  Generalized weakness x 1 week. Port a cath placed 6 days ago. Hx of multiple myeloma,anemia,CKD stage 5, dialysis 3 x  weekly./bbj  EXAM: PORTABLE CHEST - 1 VIEW  COMPARISON:  02/18/2015  FINDINGS: Relatively low lung volumes with some crowding of perihilar and bibasilar bronchovascular structures left greater than right. Cervical fixation hardware partially visualized. Mild cardiomegaly. No pneumothorax. Stable right IJ port catheter. Vascular stent in the right axilla. Sclerotic right seventh rib lesion as before. Changes of lower thoracic kyphoplasty/ vertebroplasty. No effusion.  IMPRESSION: 1. Stable cardiomegaly, chronic and postoperative changes.   Electronically Signed   By: Lucrezia Europe M.D.   On: 02/23/2015 10:53    Review of Systems  Constitutional: Positive for malaise/fatigue. Negative for fever and chills.  Respiratory: Negative for shortness of breath.   Cardiovascular: Positive for leg swelling. Negative for chest pain and orthopnea.  Gastrointestinal: Negative for nausea and vomiting.  Neurological: Positive for weakness.   Blood pressure 168/86, pulse 63, temperature 97.7 F (36.5 C), temperature source Oral, resp. rate 18, height 5' 11"  (1.803 m), weight 240 lb 0.3 oz (108.872 kg), SpO2 97 %. Physical Exam  Constitutional: He is oriented to person, place, and time. No distress.  Eyes: No scleral icterus.  Neck: No JVD present.  Cardiovascular: Normal rate and regular rhythm.  Exam reveals no gallop.   No murmur heard. Respiratory: No respiratory distress. He has no wheezes.  GI: He exhibits distension. There is no tenderness. There is no rebound.  Musculoskeletal: He exhibits edema.  Neurological: He is alert and oriented to person, place, and time.    Assessment/Plan: Problem #1 anemia: Multifactorial including secondary to multiple myeloma/ end-stage renal disease/blood loss during dialysis.  Patient is not receiving any heparin during dialysis because of severe thrombocytopenia. Problem #2 pancytopenia: Possibly a combination of multiple myeloma and Revlimid. His platelet is low but slightly better this time. He patient does not have any history of bleeding. Problem #3 multiple myeloma: Status post bone marrow replacement Problem #4 end-stage renal disease: As stated above patient had short dialysis on Friday. Problem #5 hypertension: Patient with problem of intradialytic hypotension. Problem #6 fluid management: Patient with some edema but no significant sign of fluid overload Problem #7 metabolic bone disease: His calcium is in range. Plan: 1]We'll make arrangements for patient to get dialysis today for 31/2 hours 2] was removed about 21/2 L if his blood pressure tolerates 3] we'll check his basic metabolic panel and CBC in the morning.  Destini Cambre S 02/24/2015, 9:30 AM

## 2015-02-24 NOTE — Progress Notes (Signed)
Patient is refusing morning labs, which include CBC post blood transfusion.

## 2015-02-24 NOTE — Discharge Summary (Signed)
Physician Discharge Summary  [name] QXI:[id number] DOB: May 14, 1946 DOA: 02/23/2015  PCP: No PCP Per Patient  Admit date: 02/23/2015 Discharge date: 02/24/2015  Time spent: 45 minutes  Recommendations for Outpatient Follow-up:  -Will be discharged home today. -Advised to follow up with his dialysis center as scheduled.   Discharge Diagnoses:  Principal Problem:   Anemia in chronic kidney disease Active Problems:   Essential hypertension, benign   Anemia   Weakness generalized   ESRD on dialysis ([place])   Anemia, chronic renal failure   Discharge Condition: Stable and improved  Filed Weights   02/23/15 0949 02/24/15 0629 02/24/15 1550  Weight: 108.41 kg (239 lb) 108.872 kg (240 lb 0.3 oz) 111.63 kg (246 lb 1.6 oz)    History of present illness:  69 y/o man c/o severe fatigue and weakness for well over a week. PMH significant fir ESRD on HD TTS, HTN, MM. Says "everything has gone down hill since I started dialysis". Denies SOB, dizziness or lightheadedness. No CP. Was found to have a Hb of 6.8. He is due for HD today. We have been asked to admit him for further evaluation and management.  Hospital Course:   Generalized Weakness -improved. -2/2 anemia on top of general decline with CKD and semi-new on HD. -TSH ok. -Has been ambulating without difficulty per RN staff.  Anemia of CKD -Hb 6.3 on admission; presumed 2/2 ESRD plus some acute losses with repeated instances of his clotted AVF. -Received 2 units of PRBCs on 10/8. -Repeat Hb is 8.3.  ESRD -Nephrology consulted for HD. -Normal schedule is TTS; missed Thursday due to clotted fistula requiring revision and was only able to do a short session on Saturday for the same reason. -For HD today.  HTN -Fair control. -Should improve after HD.  MM -Continue revlimid and OP follow up with oncology.  Pancytopenia -Presumed related to MM and revlimid.  Procedures:  HD   Consultations:  Renal, Dr.  Lowanda Foster  Discharge Instructions      Discharge Instructions    Diet - low sodium heart healthy    Complete by:  As directed      Increase activity slowly    Complete by:  As directed             Medication List    TAKE these medications        albuterol (2.5 MG/3ML) 0.083% nebulizer solution  Commonly known as:  PROVENTIL  Take 3 mLs (2.5 mg total) by nebulization every 6 (six) hours as needed for wheezing or shortness of breath.     calcium acetate 667 MG capsule  Commonly known as:  PHOSLO  Take 667-2,001 mg by mouth 4 (four) times daily. Take 3 capsules three times daily with meals and 1 capsule with snacks     docusate sodium 100 MG capsule  Commonly known as:  COLACE  Take 100 mg by mouth daily.     Fish Oil 1000 MG Caps  Take 2 capsules by mouth daily.     lenalidomide 5 MG capsule  Commonly known as:  REVLIMID  Take one tablet every other day, after dialysis     oxyCODONE-acetaminophen 5-325 MG tablet  Commonly known as:  ROXICET  Take 1-2 tablets by mouth every 6 (six) hours as needed for moderate pain.     pregabalin 75 MG capsule  Commonly known as:  LYRICA  Take 1 capsule (75 mg total) by mouth 2 (two) times daily.  torsemide 20 MG tablet  Commonly known as:  DEMADEX  Take 40 mg by mouth daily.       Allergies  Allergen Reactions  . Pamidronate Anaphylaxis    blood pressure       The results of significant diagnostics from this hospitalization (including imaging, microbiology, ancillary and laboratory) are listed below for reference.    Significant Diagnostic Studies: Dg Chest Portable 1 View  02/23/2015   CLINICAL DATA:  Generalized weakness x 1 week. Port a cath placed 6 days ago. Hx of multiple myeloma,anemia,CKD stage 5, dialysis 3 x weekly./bbj  EXAM: PORTABLE CHEST - 1 VIEW  COMPARISON:  02/18/2015  FINDINGS: Relatively low lung volumes with some crowding of perihilar and bibasilar bronchovascular structures left greater than right.  Cervical fixation hardware partially visualized. Mild cardiomegaly. No pneumothorax. Stable right IJ port catheter. Vascular stent in the right axilla. Sclerotic right seventh rib lesion as before. Changes of lower thoracic kyphoplasty/ vertebroplasty. No effusion.  IMPRESSION: 1. Stable cardiomegaly, chronic and postoperative changes.   Electronically Signed   By: Lucrezia Europe M.D.   On: 02/23/2015 10:53   Dg Chest Port 1 View  02/18/2015   CLINICAL DATA:  Multiple myeloma.  MediPort placement.  EXAM: PORTABLE CHEST 1 VIEW  COMPARISON:  09/25/2014 chest radiograph.  FINDINGS: Right internal jugular MediPort terminates in the middle third of the superior vena cava. Vascular stent overlies the right axilla. Surgical fusion hardware overlies the lower cervical spine bilaterally stable cardiomediastinal silhouette with mild cardiomegaly and mildly tortuous atherosclerotic thoracic aorta. No pneumothorax. No pleural effusion. No pulmonary edema. No focal lung consolidation. There is a stable mild deformity of the posterior right seventh rib, likely from healed fracture.  IMPRESSION: 1. Right internal jugular MediPort terminates in the middle third of the superior vena cava. No pneumothorax. 2. Stable mild cardiomegaly without pulmonary edema.   Electronically Signed   By: Ilona Sorrel M.D.   On: 02/18/2015 12:44    Microbiology: No results found for this or any previous visit (from the past 240 hour(s)).   Labs: Basic Metabolic Panel:  Recent Labs Lab 02/18/15 0743 02/23/15 1015  NA 139 138  K 4.4 4.2  CL  --  103  CO2  --  27  GLUCOSE 103* 124*  BUN  --  38*  CREATININE  --  6.88*  CALCIUM  --  8.7*   Liver Function Tests: No results for input(s): AST, ALT, ALKPHOS, BILITOT, PROT, ALBUMIN in the last 168 hours. No results for input(s): LIPASE, AMYLASE in the last 168 hours. No results for input(s): AMMONIA in the last 168 hours. CBC:  Recent Labs Lab 02/18/15 0743 02/18/15 0745  02/23/15 1015 02/24/15 1500  WBC  --  2.0* 1.5* 2.3*  NEUTROABS  --   --  0.6*  --   HGB 7.5* 7.4* 6.3* 8.3*  HCT 22.0* 22.6* 18.6* 24.0*  MCV  --  111.9* 110.7* 103.4*  PLT  --  41* 26* 27*   Cardiac Enzymes: No results for input(s): CKTOTAL, CKMB, CKMBINDEX, TROPONINI in the last 168 hours. BNP: BNP (last 3 results)  Recent Labs  06/22/14 2359  BNP 2068.0*    ProBNP (last 3 results) No results for input(s): PROBNP in the last 8760 hours.  CBG: No results for input(s): GLUCAP in the last 168 hours.     SignedLelon Frohlich  [facility] Pager: [phone] 02/24/2015, 4:38 PM

## 2015-02-25 ENCOUNTER — Other Ambulatory Visit (HOSPITAL_COMMUNITY): Payer: Medicare Other

## 2015-02-25 DIAGNOSIS — D500 Iron deficiency anemia secondary to blood loss (chronic): Secondary | ICD-10-CM | POA: Diagnosis not present

## 2015-02-25 LAB — HEPATITIS B SURFACE ANTIGEN: Hepatitis B Surface Ag: NEGATIVE

## 2015-02-25 NOTE — Progress Notes (Signed)
PT Cancellation Note  Patient Details Name: [name] MRN: [id number] DOB: 02-07-1946   Cancelled Treatment:    Reason Eval/Treat Not Completed: PT screened, no needs identified, will sign off.  Pt states that he has been up walking to the bathroom and had no problems.  He feels much better with medical treatment.  He declines a PT evaluation for these reasons.   Demetrios Isaacs L  PT 02/25/2015, 9:08 AM [phone]

## 2015-02-25 NOTE — Progress Notes (Signed)
Subjective: Patient feels much better today. Still feels tired but denies any difficulty breathing or orthopnea.  Objective: Vital signs in last 24 hours: Temp:  [98 F (36.7 C)-99.7 F (37.6 C)] 98 F (36.7 C) (10/10 0520) Pulse Rate:  [55-76] 58 (10/10 0520) Resp:  [16-20] 20 (10/10 0520) BP: (101-178)/(56-93) 178/79 mmHg (10/10 0520) SpO2:  [95 %-97 %] 96 % (10/10 0520) Weight:  [236 lb 15.9 oz (107.5 kg)-246 lb 1.6 oz (111.63 kg)] 236 lb 15.9 oz (107.5 kg) (10/10 0520)  Intake/Output from previous day: 10/09 0701 - 10/10 0700 In: 480 [P.O.:480] Out: 2400 [Urine:200] Intake/Output this shift:     Recent Labs  02/23/15 1015 02/24/15 1500  HGB 6.3* 8.3*    Recent Labs  02/23/15 1015 02/24/15 1500  WBC 1.5* 2.3*  RBC 1.68* 2.32*  HCT 18.6* 24.0*  PLT 26* 27*    Recent Labs  02/23/15 1015 02/24/15 1500  NA 138 137  K 4.2 4.4  CL 103 105  CO2 27 23  BUN 38* 54*  CREATININE 6.88* 8.36*  GLUCOSE 124* 113*  CALCIUM 8.7* 10.2   No results for input(s): LABPT, INR in the last 72 hours.  Generally patient is alert and in no apparent distress Chest is clear to auscultation His heart exam reveals regular rate and rhythm no murmur or S3 Abdomen seems to be distended but nontender. Extremities no edema  Assessment/Plan: Problem #1 end-stage renal disease: He is status post hemodialysis yesterday. Presently he is asymptomatic and his potassium is normal. Problem #2 anemia: His status post blood transfusion his hemoglobin is 8.3 much better and also patient is feeling better. Problem #3 pancytopenia: His white blood cell count and platelet is also better. 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 Plan: 1]We'll make arrangements for patient to get dialysis tomorrow which is his regular  schedule. If patient is going to be discharged to be done as an outpatient. 2]We'll check his basic metabolic panel and CBC in the morning.   Anahi Belmar S 02/25/2015, 8:21 AM

## 2015-02-25 NOTE — Progress Notes (Addendum)
Pt requested Phoslo prior to discharge since breakfast eaten.    Pt alert and oriented, no complaints at this time.  Awaiting ride for discharge at this time.     Pt IV removed, tolerated well.  Reviewed discharge instructions with pt, no questions at this time.

## 2015-02-25 NOTE — Progress Notes (Signed)
Pt ready to discharge, refusing to wait for ride to get here, pt escorted to lobby at this time per his request.

## 2015-02-28 NOTE — Assessment & Plan Note (Addendum)
Multiple myeloma, not currently active, S/P 2 autologous BM transplant at [facility] under the guidance of Dr. Marcell Anger (retired) with the second bone marrow transplant occuring in July 2013. Recent bone marrow demonstrates 5% plasma cells and he was seen by Dr. Norma Fredrickson who recommended starting Revlimid at a dose of 5 mg 21/28 days. His Revlimid dosing has been complicated by pruritic rash and Benen has decided to take Revlimid every other day which he is tolerating well.  Will try to increase the dose in the future.  Chart reviewed.  Labs in 2 weeks : CBC diff, CMET, LDH, ESR, CRP, B2M, MM panel.  Labs in 4 weeks: CBC.  Labs in 6 weeks: CBC diff, CMET, LDH, ESR, CRP, B2M, MM panel.  Return in 6 weeks for follow-up.    He is resistant to changes in Revlimid dosing given patient reported tolerability issues.  He will continue the same dose.  I had a conversation with the patient regarding his behavior in our clinic.  He has demanded calls from me that are unrelated to his multiple myeloma.  I have been too busy to call Joseluis every time he calls the clinic and therefore, I have asked nursing to triage, but Deloy refuses to speak with them.  I have educated Domenick that he is very important to me, but he needs to understand that he is not the only patient that needs attention and I cannot drop what I am doing to attend to his telephone calls.  From now on, if he calls, nursing will triage and if he does not participate in this procedure, return calls will not take place.  He has been abusive verbally to our nursing staff and front staff.  This will need to cease.  Additionally, we discussed his demands for blood transfusions.  I will need to work this out with his nephrologist due to concerns with volume overload while on hemodialysis.  We will continue to manage his platelets.

## 2015-02-28 NOTE — Progress Notes (Signed)
No PCP Per Patient No address on file  Multiple myeloma, remission status unspecified ([place]) - Plan: CBC, CBC with Differential, Comprehensive metabolic panel, Lactate dehydrogenase, Sedimentation rate, Beta 2 microglobuline, serum, C-reactive protein, Multiple myeloma panel, serum, Kappa/lambda light chains  Need for prophylactic vaccination and inoculation against influenza - Plan: Influenza vac split quadrivalent PF (FLUARIX) injection 0.5 mL  CURRENT THERAPY: Revlimid 5 mg every other day  INTERVAL HISTORY: [name] 69 y.o. male returns for followup of multiple myeloma, not active at this time, S/P 2 autologous BM transplant at [facility] under the guidance of Dr. Marcell Anger (retired) with the second bone marrow transplant occuring in July 2013. Recent bone marrow demonstrates 5% plasma cells and he was seen by Dr. Norma Fredrickson who recommended starting Revlimid at a dose of 5 mg 21/28 days. His Revlimid dosing has been complicated by pruritic rash and [name] has decided to take Revlimid every other day which he is tolerating well.  Will try to increase the dose in the future.    Multiple myeloma ([place])   01/26/2011 Initial Diagnosis Multiple myeloma   09/10/2014 - 09/14/2014 Chemotherapy Revlimid 5 mg days 1-21 every 28 days   09/14/2014 Adverse Reaction Rash, Revlimid versus Doxycycline   09/21/2014 - 09/23/2014 Chemotherapy Revlimid 5 mg days 1-21 every 28 days- rechallenge.   09/24/2014 Adverse Reaction Rash.  Revlimid-induced   10/22/2014 -  Chemotherapy Revlimid 5 mg every other day.   I personally reviewed and went over laboratory results with the patient.  The results are noted within this dictation.  I personally reviewed and went over radiographic studies with the patient.  The results are noted within this dictation.  Bone survey is negative for any lytic lesions.  Chart reviewed.  Hospitalization is noted. He was admitted with an anemia of chronic disease after presenting to the ED  with fatigue and weakness for 1 week.  Hgb was noted to be 6.8 g/dL.  He is S/P 2 units of PRBCs on 10/8.  [name] is given quite a bit of information today regarding our concerns for PRBC transfusions in the setting of hemodialysis due to concerns associated volume.  From a platelet standpoint, we can support his counts.  I have paged Dr. Lowanda Foster for his stance on this so we can be on the same page.  I have also informed that his behavior in our clinic has been inappropriate.  I will not, and cannot, stop what I am doing in the clinic to return his phone calls, particularly when he refuses nursing triage up front.  He is informed that clinic is to busy to talk to him every time, and if there was a light day and he needed me, I would be glad to call him.  However, of late, that has not been the case.   Past Medical History  Diagnosis Date  . Hypertension   . Ruptured cervical disc   . Fall resulting in striking against other object     paralyzed  . Multiple myeloma   . Recurrent multiple myeloma of bone marrow with unknown EBV status ([place])   . Multiple myeloma ([place]) 01/26/2011  . Unspecified vitamin D deficiency 03/06/2013  . Anemia of chronic disease 12/30/2013  . Constipation   . Dialysis patient ([place]) 2016  . Thrombocytopenia ([place]) 08/31/2014  . 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  Constipation     has Essential hypertension, benign; BRADYCARDIA; Multiple myeloma ([place]); Intravenous pamidronate causing adverse effect in therapeutic use; Unspecified vitamin D deficiency; Lymphedema of lower extremity; Anemia of chronic disease; Chronic renal disease, stage 5, glomerular filtration rate less than or equal to 15 mL/min/1.73 square meter ([place]); Pathologic fracture; Anemia in chronic kidney disease; Thrombocytopenia ([place]); Bleeding; Anemia; Weakness generalized; ESRD on dialysis ([place]);  and Anemia, chronic renal failure on his problem list.     is allergic to pamidronate.  Current Outpatient Prescriptions on File Prior to Visit  Medication Sig Dispense Refill  . albuterol (PROVENTIL) (2.5 MG/3ML) 0.083% nebulizer solution Take 3 mLs (2.5 mg total) by nebulization every 6 (six) hours as needed for wheezing or shortness of breath. 20 vial 1  . calcium acetate (PHOSLO) 667 MG capsule Take 667-2,001 mg by mouth 4 (four) times daily. Take 3 capsules three times daily with meals and 1 capsule with snacks    . docusate sodium (COLACE) 100 MG capsule Take 100 mg by mouth daily.     Marland Kitchen lenalidomide (REVLIMID) 5 MG capsule Take one tablet every other day, after dialysis 14 capsule 0  . Omega-3 Fatty Acids (FISH OIL) 1000 MG CAPS Take 2 capsules by mouth daily.    Marland Kitchen oxyCODONE-acetaminophen (ROXICET) 5-325 MG tablet Take 1-2 tablets by mouth every 6 (six) hours as needed for moderate pain. 10 tablet 0  . pregabalin (LYRICA) 75 MG capsule Take 1 capsule (75 mg total) by mouth 2 (two) times daily. 60 capsule 5  . torsemide (DEMADEX) 20 MG tablet Take 40 mg by mouth daily.     No current facility-administered medications on file prior to visit.    Past Surgical History  Procedure Laterality Date  . Inguinal hernia repair Bilateral   . Anterior cervical decomp/discectomy fusion    . Bone marrow aspirate and biopsy wiith lumbar puncture    . Melanoma excision      rt. breast  . Cervical spine surgery    . Kyphoplasty Bilateral 01/30/2014    Procedure: Thoracic eleven Kyphoplasty;  Surgeon: Consuella Lose, MD;  Location: [facility];  Service: Neurosurgery;  Laterality: Bilateral;  Thoracic eleven Kyphoplasty  . Av fistula placement Right 04/30/2014    Procedure: BRACHIOCEPHALIC ARTERIOVENOUS (AV) FISTULA CREATION;  Surgeon: Conrad Y[place] Point, MD;  Location: [facility];  Service: Vascular;  Laterality: Right;  . Portacath placement Left 02/18/2015    Procedure: INSERTION OF PORT A CATH RIGHT  INTERNAL JUGULAR VEIN ;  Surgeon: Conrad Mildred, MD;  Location: [facility];  Service: Vascular;  Laterality: Left;    Denies any headaches, dizziness, double vision, fevers, chills, night sweats, nausea, vomiting, diarrhea, constipation, chest pain, heart palpitations, shortness of breath, blood in stool, black tarry stool, urinary pain, urinary burning, urinary frequency, hematuria.   PHYSICAL EXAMINATION  ECOG PERFORMANCE STATUS: 1 - Symptomatic but completely ambulatory  Filed Vitals:   03/01/15 1011  BP: 157/67  Pulse: 61  Temp: 98.1 F (36.7 C)  Resp: 18    GENERAL:alert, no distress, well nourished, well developed, comfortable, cooperative, obese and smiling SKIN: skin color, texture, turgor are normal, no rashes or significant lesions HEAD: Normocephalic, No masses, lesions, tenderness or abnormalities EYES: normal, PERRLA, EOMI, Conjunctiva are pink and non-injected EARS: External ears normal OROPHARYNX:lips, buccal mucosa, and tongue normal and mucous membranes are moist  NECK: supple, trachea midline LYMPH:  not examined BREAST:not examined CARDIAC: Not examined LUNGS: Not examined ABDOMEN: Not examined BACK: Back symmetric,  no curvature. EXTREMITIES:less then 2 second capillary refill, no joint deformities, effusion, or inflammation, no skin discoloration  NEURO: alert & oriented x 3 with fluent speech, no focal motor/sensory deficits, gait normal   LABORATORY DATA: CBC    Component Value Date/Time   WBC 2.3* 02/24/2015 1500   RBC 2.32* 02/24/2015 1500   HGB 8.3* 02/24/2015 1500   HCT 24.0* 02/24/2015 1500   PLT 27* 02/24/2015 1500   MCV 103.4* 02/24/2015 1500   MCH 35.8* 02/24/2015 1500   MCHC 34.6 02/24/2015 1500   RDW 20.0* 02/24/2015 1500   LYMPHSABS 0.7 02/23/2015 1015   MONOABS 0.1 02/23/2015 1015   EOSABS 0.1 02/23/2015 1015   BASOSABS 0.0 02/23/2015 1015      Chemistry      Component Value Date/Time   NA 137 02/24/2015 1500   K 4.4 02/24/2015  1500   CL 105 02/24/2015 1500   CO2 23 02/24/2015 1500   BUN 54* 02/24/2015 1500   CREATININE 8.36* 02/24/2015 1500   CREATININE 43.09* 01/26/2012 1052      Component Value Date/Time   CALCIUM 10.2 02/24/2015 1500   ALKPHOS 34* 02/01/2015 0858   AST 21 02/01/2015 0858   ALT 18 02/01/2015 0858   BILITOT 0.9 02/01/2015 0858     Lab Results  Component Value Date   PROT 6.4* 02/01/2015   ALBUMINELP 63.2 05/16/2014   A1GS 4.8 05/16/2014   A2GS 9.2 05/16/2014   BETS 5.8 05/16/2014   BETA2SER 4.1 05/16/2014   GAMS 12.9 05/16/2014   MSPIKE NOT DETECTED 05/16/2014   SPEI (NOTE) 05/16/2014   SPECOM (NOTE) 05/16/2014   IGGSERUM 884 02/01/2015   IGA 252 02/01/2015   IGMSERUM 76 02/01/2015   IMMELINT (NOTE) 05/16/2014   KPAFRELGTCHN 245.53* 02/01/2015   LAMBDASER 110.44* 02/01/2015   KAPLAMBRATIO 2.22* 02/01/2015    PENDING LABS:   RADIOGRAPHIC STUDIES:  Dg Chest Portable 1 View  02/23/2015  CLINICAL DATA:  Generalized weakness x 1 week. Port a cath placed 6 days ago. Hx of multiple myeloma,anemia,CKD stage 5, dialysis 3 x weekly./bbj EXAM: PORTABLE CHEST - 1 VIEW COMPARISON:  02/18/2015 FINDINGS: Relatively low lung volumes with some crowding of perihilar and bibasilar bronchovascular structures left greater than right. Cervical fixation hardware partially visualized. Mild cardiomegaly. No pneumothorax. Stable right IJ port catheter. Vascular stent in the right axilla. Sclerotic right seventh rib lesion as before. Changes of lower thoracic kyphoplasty/ vertebroplasty. No effusion. IMPRESSION: 1. Stable cardiomegaly, chronic and postoperative changes. Electronically Signed   By: Lucrezia Europe M.D.   On: 02/23/2015 10:53   Dg Chest Port 1 View  02/18/2015  CLINICAL DATA:  Multiple myeloma.  MediPort placement. EXAM: PORTABLE CHEST 1 VIEW COMPARISON:  09/25/2014 chest radiograph. FINDINGS: Right internal jugular MediPort terminates in the middle third of the superior vena cava. Vascular  stent overlies the right axilla. Surgical fusion hardware overlies the lower cervical spine bilaterally stable cardiomediastinal silhouette with mild cardiomegaly and mildly tortuous atherosclerotic thoracic aorta. No pneumothorax. No pleural effusion. No pulmonary edema. No focal lung consolidation. There is a stable mild deformity of the posterior right seventh rib, likely from healed fracture. IMPRESSION: 1. Right internal jugular MediPort terminates in the middle third of the superior vena cava. No pneumothorax. 2. Stable mild cardiomegaly without pulmonary edema. Electronically Signed   By: Ilona Sorrel M.D.   On: 02/18/2015 12:44     PATHOLOGY:    ASSESSMENT AND PLAN:  Multiple myeloma Multiple myeloma, not currently active, S/P 2 autologous  BM transplant at [facility] under the guidance of Dr. Marcell Anger (retired) with the second bone marrow transplant occuring in July 2013. Recent bone marrow demonstrates 5% plasma cells and he was seen by Dr. Norma Fredrickson who recommended starting Revlimid at a dose of 5 mg 21/28 days. His Revlimid dosing has been complicated by pruritic rash and Delroy has decided to take Revlimid every other day which he is tolerating well.  Will try to increase the dose in the future.  Chart reviewed.  Labs in 2 weeks : CBC diff, CMET, LDH, ESR, CRP, B2M, MM panel.  Labs in 4 weeks: CBC.  Labs in 6 weeks: CBC diff, CMET, LDH, ESR, CRP, B2M, MM panel.  Return in 6 weeks for follow-up.    He is resistant to changes in Revlimid dosing given patient reported tolerability issues.  He will continue the same dose.  I had a conversation with the patient regarding his behavior in our clinic.  He has demanded calls from me that are unrelated to his multiple myeloma.  I have been too busy to call [place] every time he calls the clinic and therefore, I have asked nursing to triage, but Parke refuses to speak with them.  I have educated Lashan that he is very important to me, but he needs to  understand that he is not the only patient that needs attention and I cannot drop what I am doing to attend to his telephone calls.  From now on, if he calls, nursing will triage and if he does not participate in this procedure, return calls will not take place.  He has been abusive verbally to our nursing staff and front staff.  This will need to cease.  Additionally, we discussed his demands for blood transfusions.  I will need to work this out with his nephrologist due to concerns with volume overload while on hemodialysis.  We will continue to manage his platelets.   THERAPY PLAN:  Continue Revlimid every other day.  Will try to increase dose to 21/28 days in the future.  All questions were answered. The patient knows to call the clinic with any problems, questions or concerns. We can certainly see the patient much sooner if necessary.  Patient and plan discussed with Dr. Ancil Linsey and she is in agreement with the aforementioned.   This note is electronically signed by: Doy Mince 03/01/2015 12:19 PM

## 2015-03-01 ENCOUNTER — Encounter (HOSPITAL_COMMUNITY): Payer: Medicare Other | Attending: Oncology | Admitting: Oncology

## 2015-03-01 ENCOUNTER — Encounter (HOSPITAL_COMMUNITY): Payer: Self-pay | Admitting: Oncology

## 2015-03-01 VITALS — BP 157/67 | HR 61 | Temp 98.1°F | Resp 18 | Wt 247.0 lb

## 2015-03-01 DIAGNOSIS — C9000 Multiple myeloma not having achieved remission: Secondary | ICD-10-CM | POA: Diagnosis not present

## 2015-03-01 DIAGNOSIS — Z23 Encounter for immunization: Secondary | ICD-10-CM | POA: Diagnosis not present

## 2015-03-01 DIAGNOSIS — Z992 Dependence on renal dialysis: Secondary | ICD-10-CM

## 2015-03-01 DIAGNOSIS — Z9481 Bone marrow transplant status: Secondary | ICD-10-CM

## 2015-03-01 MED ORDER — INFLUENZA VAC SPLIT QUAD 0.5 ML IM SUSY
0.5000 mL | PREFILLED_SYRINGE | Freq: Once | INTRAMUSCULAR | Status: AC
  Administered 2015-03-01: 0.5 mL via INTRAMUSCULAR
  Filled 2015-03-01: qty 0.5

## 2015-03-01 NOTE — Patient Instructions (Signed)
..  [place] at [facility] Discharge Instructions  RECOMMENDATIONS MADE BY THE CONSULTANT AND ANY TEST RESULTS WILL BE SENT TO YOUR REFERRING PHYSICIAN.  Labs in 2 weeks, 4 weeks and 6 weeks Flu shot today  Thank you for choosing [place] at [facility] to provide your oncology and hematology care.  To afford each patient quality time with our provider, please arrive at least 15 minutes before your scheduled appointment time.    You need to re-schedule your appointment should you arrive 10 or more minutes late.  We strive to give you quality time with our providers, and arriving late affects you and other patients whose appointments are after yours.  Also, if you no show three or more times for appointments you may be dismissed from the clinic at the providers discretion.     Again, thank you for choosing [facility].  Our hope is that these requests will decrease the amount of time that you wait before being seen by our physicians.       _____________________________________________________________  Should you have questions after your visit to [facility], please contact our office at [phone] between the hours of 8:30 a.m. and 4:30 p.m.  Voicemails left after 4:30 p.m. will not be returned until the following business day.  For prescription refill requests, have your pharmacy contact our office.

## 2015-03-06 ENCOUNTER — Other Ambulatory Visit (HOSPITAL_COMMUNITY): Payer: Self-pay | Admitting: Oncology

## 2015-03-06 DIAGNOSIS — C9000 Multiple myeloma not having achieved remission: Secondary | ICD-10-CM

## 2015-03-06 MED ORDER — LENALIDOMIDE 5 MG PO CAPS: ORAL_CAPSULE | ORAL | Status: DC

## 2015-03-14 ENCOUNTER — Emergency Department (HOSPITAL_COMMUNITY)
Admission: EM | Admit: 2015-03-14 | Discharge: 2015-03-14 | Disposition: A | Discharge status: Home / Self Care | Payer: Medicare Other | Attending: Emergency Medicine | Admitting: Emergency Medicine

## 2015-03-14 ENCOUNTER — Encounter (HOSPITAL_COMMUNITY): Payer: Self-pay | Admitting: *Deleted

## 2015-03-14 DIAGNOSIS — D638 Anemia in other chronic diseases classified elsewhere: Secondary | ICD-10-CM | POA: Insufficient documentation

## 2015-03-14 DIAGNOSIS — Z79899 Other long term (current) drug therapy: Secondary | ICD-10-CM | POA: Diagnosis not present

## 2015-03-14 DIAGNOSIS — D61818 Other pancytopenia: Secondary | ICD-10-CM | POA: Diagnosis not present

## 2015-03-14 DIAGNOSIS — K5900 Constipation, unspecified: Secondary | ICD-10-CM | POA: Insufficient documentation

## 2015-03-14 DIAGNOSIS — I120 Hypertensive chronic kidney disease with stage 5 chronic kidney disease or end stage renal disease: Secondary | ICD-10-CM | POA: Insufficient documentation

## 2015-03-14 DIAGNOSIS — N186 End stage renal disease: Secondary | ICD-10-CM

## 2015-03-14 DIAGNOSIS — Z87828 Personal history of other (healed) physical injury and trauma: Secondary | ICD-10-CM | POA: Insufficient documentation

## 2015-03-14 DIAGNOSIS — Z8579 Personal history of other malignant neoplasms of lymphoid, hematopoietic and related tissues: Secondary | ICD-10-CM | POA: Insufficient documentation

## 2015-03-14 DIAGNOSIS — R112 Nausea with vomiting, unspecified: Secondary | ICD-10-CM | POA: Diagnosis not present

## 2015-03-14 DIAGNOSIS — R531 Weakness: Secondary | ICD-10-CM | POA: Diagnosis not present

## 2015-03-14 DIAGNOSIS — Z992 Dependence on renal dialysis: Secondary | ICD-10-CM | POA: Insufficient documentation

## 2015-03-14 DIAGNOSIS — Z8639 Personal history of other endocrine, nutritional and metabolic disease: Secondary | ICD-10-CM | POA: Insufficient documentation

## 2015-03-14 DIAGNOSIS — R52 Pain, unspecified: Secondary | ICD-10-CM | POA: Diagnosis present

## 2015-03-14 LAB — COMPREHENSIVE METABOLIC PANEL
ALT: 14 U/L — ABNORMAL LOW (ref 17–63)
AST: 18 U/L (ref 15–41)
Albumin: 3.3 g/dL — ABNORMAL LOW (ref 3.5–5.0)
Alkaline Phosphatase: 34 U/L — ABNORMAL LOW (ref 38–126)
Anion gap: 9 (ref 5–15)
BUN: 38 mg/dL — ABNORMAL HIGH (ref 6–20)
CO2: 26 mmol/L (ref 22–32)
Calcium: 9.9 mg/dL (ref 8.9–10.3)
Chloride: 104 mmol/L (ref 101–111)
Creatinine, Ser: 7.29 mg/dL — ABNORMAL HIGH (ref 0.61–1.24)
GFR calc Af Amer: 8 mL/min — ABNORMAL LOW (ref >60)
GFR calc non Af Amer: 7 mL/min — ABNORMAL LOW (ref >60)
Glucose, Bld: 108 mg/dL — ABNORMAL HIGH (ref 65–99)
Potassium: 4.9 mmol/L (ref 3.5–5.1)
Sodium: 139 mmol/L (ref 135–145)
Total Bilirubin: 1.2 mg/dL (ref 0.3–1.2)
Total Protein: 6.4 g/dL — ABNORMAL LOW (ref 6.5–8.1)

## 2015-03-14 LAB — CBC WITH DIFFERENTIAL/PLATELET
Basophils Absolute: 0 10*3/uL (ref 0.0–0.1)
Basophils Relative: 1 %
Eosinophils Absolute: 0.1 10*3/uL (ref 0.0–0.7)
Eosinophils Relative: 4 %
HCT: 23.6 % — ABNORMAL LOW (ref 39.0–52.0)
Hemoglobin: 7.8 g/dL — ABNORMAL LOW (ref 13.0–17.0)
Lymphocytes Relative: 55 %
Lymphs Abs: 0.9 10*3/uL (ref 0.7–4.0)
MCH: 36.1 pg — ABNORMAL HIGH (ref 26.0–34.0)
MCHC: 33.1 g/dL (ref 30.0–36.0)
MCV: 109.3 fL — ABNORMAL HIGH (ref 78.0–100.0)
Monocytes Absolute: 0.1 10*3/uL (ref 0.1–1.0)
Monocytes Relative: 6 %
Neutro Abs: 0.6 10*3/uL — ABNORMAL LOW (ref 1.7–7.7)
Neutrophils Relative %: 35 %
Platelets: 18 10*3/uL — CL (ref 150–400)
RBC: 2.16 MIL/uL — ABNORMAL LOW (ref 4.22–5.81)
RDW: 19.3 % — ABNORMAL HIGH (ref 11.5–15.5)
WBC: 1.7 10*3/uL — ABNORMAL LOW (ref 4.0–10.5)

## 2015-03-14 MED ORDER — ONDANSETRON HCL 4 MG/2ML IJ SOLN
4.0000 mg | Freq: Once | INTRAMUSCULAR | Status: DC
  Filled 2015-03-14: qty 2

## 2015-03-14 MED ORDER — PROMETHAZINE HCL 25 MG/ML IJ SOLN
12.5000 mg | Freq: Once | INTRAMUSCULAR | Status: AC
  Administered 2015-03-14: 12.5 mg via INTRAVENOUS
  Filled 2015-03-14: qty 1

## 2015-03-14 MED ORDER — MORPHINE SULFATE (PF) 4 MG/ML IV SOLN
6.0000 mg | Freq: Once | INTRAVENOUS | Status: AC
  Administered 2015-03-14: 6 mg via INTRAVENOUS
  Filled 2015-03-14: qty 2

## 2015-03-14 NOTE — Discharge Instructions (Signed)
Chronic Kidney Disease °Chronic kidney disease occurs when the kidneys are damaged over a long period. The kidneys are two organs that lie on either side of the spine between the middle of the back and the front of the abdomen. The kidneys: °· Remove wastes and extra water from the blood. °· Produce important hormones. These help keep bones strong, regulate blood pressure, and help create red blood cells. °· Balance the fluids and chemicals in the blood and tissues. °A small amount of kidney damage may not cause problems, but a large amount of damage may make it difficult or impossible for the kidneys to work the way they should. If steps are not taken to slow down the kidney damage or stop it from getting worse, the kidneys may stop working permanently. Most of the time, chronic kidney disease does not go away. However, it can often be controlled, and those with the disease can usually live normal lives. °CAUSES °The most common causes of chronic kidney disease are diabetes and high blood pressure (hypertension). Chronic kidney disease may also be caused by: °· Diseases that cause the kidneys' filters to become inflamed. °· Diseases that affect the immune system. °· Genetic diseases. °· Medicines that damage the kidneys, such as anti-inflammatory medicines. °· Poisoning or exposure to toxic substances. °· A reoccurring kidney or urinary infection. °· A problem with urine flow. This may be caused by: °¨ Cancer. °¨ Kidney stones. °¨ An enlarged prostate in males. °SIGNS AND SYMPTOMS °Because the kidney damage in chronic kidney disease occurs slowly, symptoms develop slowly and may not be obvious until the kidney damage becomes severe. A person may have a kidney disease for years without showing any symptoms. Symptoms can include: °· Swelling (edema) of the legs, ankles, or feet. °· Tiredness (lethargy). °· Nausea or vomiting. °· Confusion. °· Problems with urination, such as: °¨ Decreased urine  production. °¨ Frequent urination, especially at night. °¨ Frequent accidents in children who are potty trained. °· Muscle twitches and cramps. °· Shortness of breath. °· Weakness. °· Persistent itchiness. °· Loss of appetite. °· Metallic taste in the mouth. °· Trouble sleeping. °· Slowed development in children. °· Short stature in children. °DIAGNOSIS °Chronic kidney disease may be detected and diagnosed by tests, including blood, urine, imaging, or kidney biopsy tests. °TREATMENT °Most chronic kidney diseases cannot be cured. Treatment usually involves relieving symptoms and preventing or slowing the progression of the disease. Treatment may include: °· A special diet. You may need to avoid alcohol and foods that are salty and high in potassium. °· Medicines. These may: °¨ Lower blood pressure. °¨ Relieve anemia. °¨ Relieve swelling. °¨ Protect the bones. °HOME CARE INSTRUCTIONS °· Follow your prescribed diet.  Your health care provider may instruct you to limit daily salt (sodium) and protein intake. °· Take medicines only as directed by your health care provider. Do not take any new medicines (prescription, over-the-counter, or nutritional supplements) unless approved by your health care provider. Many medicines can worsen your kidney damage or need to have the dose adjusted.   °· Quit smoking if you smoke. Talk to your health care provider about a smoking cessation program. °· Keep all follow-up visits as directed by your health care provider. °· Monitor your blood pressure. °· Start or continue an exercise plan. °· Get immunizations as directed by your health care provider. °· Take vitamin and mineral supplements as directed by your health care provider. °SEEK IMMEDIATE MEDICAL CARE IF: °· Your symptoms get worse or you develop   new symptoms. °· You develop symptoms of end-stage kidney disease. These include: °¨ Headaches. °¨ Abnormally dark or light skin. °¨ Numbness in the hands or feet. °¨ Easy  bruising. °¨ Frequent hiccups. °¨ Menstruation stops. °· You have a fever. °· You have decreased urine production. °· You have pain or bleeding when urinating. °MAKE SURE YOU: °· Understand these instructions. °· Will watch your condition. °· Will get help right away if you are not doing well or get worse. °FOR MORE INFORMATION  °· American Association of Kidney Patients: www.aakp.org °· National Kidney Foundation: www.kidney.org °· American Kidney Fund: www.akfinc.org °· [employer] Program: www.lifeoptions.org and www.kidneyschool.org °  °This information is not intended to replace advice given to you by your health care provider. Make sure you discuss any questions you have with your health care provider. °  °Document Released: 02/11/2008 Document Revised: 05/25/2014 Document Reviewed: 01/01/2012 °Elsevier Interactive Patient Education ©2016 Elsevier Inc. ° °

## 2015-03-14 NOTE — ED Notes (Signed)
Pt brought in by [facility] for c/o generalized body aches and fever and vomiting x 1 day

## 2015-03-14 NOTE — ED Notes (Addendum)
O2 sats fluctuating 85-88% on room air. O2 at 2L/min via [place] initiated. Dr Wilson Singer notified. O2 93% after initiation

## 2015-03-14 NOTE — ED Notes (Signed)
Critical platelet count of 18. Dr Wilson Singer notified

## 2015-03-15 ENCOUNTER — Other Ambulatory Visit (HOSPITAL_COMMUNITY): Payer: Self-pay | Admitting: Oncology

## 2015-03-15 ENCOUNTER — Encounter (HOSPITAL_COMMUNITY): Payer: Medicare Other

## 2015-03-15 DIAGNOSIS — C9000 Multiple myeloma not having achieved remission: Secondary | ICD-10-CM

## 2015-03-15 MED ORDER — HEPARIN SOD (PORK) LOCK FLUSH 100 UNIT/ML IV SOLN
INTRAVENOUS | Status: AC
  Filled 2015-03-15: qty 5

## 2015-03-15 MED ORDER — ONDANSETRON HCL 8 MG PO TABS: 8.0000 mg | ORAL_TABLET | Freq: Three times a day (TID) | ORAL | Status: AC | PRN

## 2015-03-15 NOTE — Progress Notes (Signed)
Patient arrives today stating that he doesn't feel well and the he was in the Emergency Room yesterday.  He states that they drew blood and let him go and he went straight to dialysis.  I presented the lab results to [facility], who ordered platelets and 2U PRBC's.  Patient is aware that these orders are made and that he needs to return for transfusion.  He was given education on bleeding precautions.  He will return Monday for labs to type and screen him for blood products, and then return Wednesday for transfusion.  Patient, scheduler, and blood bank aware of plans.  Patient was instructed to follow up with [facility] or the Emergency Room with any further complications over the weekend, to which he verbalized understanding.

## 2015-03-18 ENCOUNTER — Other Ambulatory Visit (HOSPITAL_COMMUNITY): Payer: Medicare Other

## 2015-03-18 ENCOUNTER — Telehealth (HOSPITAL_COMMUNITY): Payer: Self-pay | Admitting: *Deleted

## 2015-03-18 ENCOUNTER — Encounter (HOSPITAL_BASED_OUTPATIENT_CLINIC_OR_DEPARTMENT_OTHER): Payer: Medicare Other

## 2015-03-18 DIAGNOSIS — C9000 Multiple myeloma not having achieved remission: Secondary | ICD-10-CM

## 2015-03-18 LAB — CBC
HCT: 24 % — ABNORMAL LOW (ref 39.0–52.0)
Hemoglobin: 7.8 g/dL — ABNORMAL LOW (ref 13.0–17.0)
MCH: 37 pg — AB (ref 26.0–34.0)
MCHC: 32.5 g/dL (ref 30.0–36.0)
MCV: 113.7 fL — AB (ref 78.0–100.0)
PLATELETS: 18 10*3/uL — AB (ref 150–400)
RBC: 2.11 MIL/uL — AB (ref 4.22–5.81)
RDW: 20 % — AB (ref 11.5–15.5)
WBC: 2.2 10*3/uL — ABNORMAL LOW (ref 4.0–10.5)

## 2015-03-18 LAB — PREPARE RBC (CROSSMATCH)

## 2015-03-18 NOTE — Progress Notes (Signed)
Labs drawn

## 2015-03-18 NOTE — Telephone Encounter (Signed)
..  CRITICAL VALUE ALERT Critical value received:  Platelets 18,000 Date of notification:  03/18/2015 Time of notification: 1100 Critical value read back:  Yes.   Nurse who received alert:  TAR MD notified (1st page):  T. Sheldon Silvan PA-C Patient is scheduled for transfusion on 11/2

## 2015-03-20 ENCOUNTER — Encounter (HOSPITAL_COMMUNITY): Payer: Medicare Other | Attending: Oncology

## 2015-03-20 VITALS — BP 148/87 | HR 65 | Temp 98.0°F | Resp 18

## 2015-03-20 DIAGNOSIS — D696 Thrombocytopenia, unspecified: Secondary | ICD-10-CM | POA: Diagnosis not present

## 2015-03-20 DIAGNOSIS — N186 End stage renal disease: Secondary | ICD-10-CM

## 2015-03-20 DIAGNOSIS — Z992 Dependence on renal dialysis: Secondary | ICD-10-CM

## 2015-03-20 DIAGNOSIS — C9000 Multiple myeloma not having achieved remission: Secondary | ICD-10-CM | POA: Diagnosis present

## 2015-03-20 MED ORDER — CALCIUM ACETATE (PHOS BINDER) 667 MG PO CAPS
2001.0000 mg | ORAL_CAPSULE | ORAL | Status: AC
  Administered 2015-03-20: 2001 mg via ORAL
  Filled 2015-03-20: qty 3

## 2015-03-20 MED ORDER — SODIUM CHLORIDE 0.9 % IV SOLN
250.0000 mL | Freq: Once | INTRAVENOUS | Status: AC
  Administered 2015-03-20: 250 mL via INTRAVENOUS

## 2015-03-20 MED ORDER — ACETAMINOPHEN 325 MG PO TABS
650.0000 mg | ORAL_TABLET | Freq: Once | ORAL | Status: AC
  Administered 2015-03-20: 650 mg via ORAL

## 2015-03-20 MED ORDER — DIPHENHYDRAMINE HCL 25 MG PO CAPS
25.0000 mg | ORAL_CAPSULE | Freq: Once | ORAL | Status: AC
  Administered 2015-03-20: 25 mg via ORAL

## 2015-03-20 MED ORDER — SODIUM CHLORIDE 0.9 % IJ SOLN
10.0000 mL | INTRAMUSCULAR | Status: AC | PRN
  Administered 2015-03-20: 10 mL

## 2015-03-20 MED ORDER — CALCIUM ACETATE (PHOS BINDER) 667 MG PO CAPS
667.0000 mg | ORAL_CAPSULE | ORAL | Status: DC
  Filled 2015-03-20: qty 1

## 2015-03-20 MED ORDER — ACETAMINOPHEN 325 MG PO TABS
ORAL_TABLET | ORAL | Status: AC
  Filled 2015-03-20: qty 2

## 2015-03-20 MED ORDER — HEPARIN SOD (PORK) LOCK FLUSH 100 UNIT/ML IV SOLN
INTRAVENOUS | Status: AC
  Filled 2015-03-20: qty 5

## 2015-03-20 MED ORDER — HEPARIN SOD (PORK) LOCK FLUSH 100 UNIT/ML IV SOLN
500.0000 [IU] | Freq: Every day | INTRAVENOUS | Status: AC | PRN
  Administered 2015-03-20: 500 [IU]

## 2015-03-20 MED ORDER — DIPHENHYDRAMINE HCL 25 MG PO CAPS
ORAL_CAPSULE | ORAL | Status: AC
  Filled 2015-03-20: qty 1

## 2015-03-20 NOTE — Patient Instructions (Signed)
[place] at [facility] Discharge Instructions  RECOMMENDATIONS MADE BY THE CONSULTANT AND ANY TEST RESULTS WILL BE SENT TO YOUR REFERRING PHYSICIAN.  Today you received a transfusion of 2 units packed red blood cells as 1 unit platelets. Return as scheduled.  Thank you for choosing [place] at [facility] to provide your oncology and hematology care.  To afford each patient quality time with our provider, please arrive at least 15 minutes before your scheduled appointment time.    You need to re-schedule your appointment should you arrive 10 or more minutes late.  We strive to give you quality time with our providers, and arriving late affects you and other patients whose appointments are after yours.  Also, if you no show three or more times for appointments you may be dismissed from the clinic at the providers discretion.     Again, thank you for choosing [facility].  Our hope is that these requests will decrease the amount of time that you wait before being seen by our physicians.       _____________________________________________________________  Should you have questions after your visit to [facility], please contact our office at [phone] between the hours of 8:30 a.m. and 4:30 p.m.  Voicemails left after 4:30 p.m. will not be returned until the following business day.  For prescription refill requests, have your pharmacy contact our office.

## 2015-03-20 NOTE — Progress Notes (Signed)
1230 tolerated first unit packed red blood cell transfusion well. 1440 tolerated second unit packed red blood cell transfusion well. 1600 tolerated platelet transfusion well. D/C home to self. Stable and ambulatory on discharge.

## 2015-03-21 LAB — PREPARE PLATELET PHERESIS: UNIT DIVISION: 0

## 2015-03-21 LAB — TYPE AND SCREEN
ABO/RH(D): A POS
ANTIBODY SCREEN: NEGATIVE
UNIT DIVISION: 0
Unit division: 0
Unit division: 0
Unit division: 0

## 2015-03-24 NOTE — ED Provider Notes (Signed)
CSN: [id number]     Arrival date & time 03/14/15  8657 History   First MD Initiated Contact with Patient 03/14/15 [phone]     Chief Complaint  Patient presents with  . Generalized Body Aches     (Consider location/radiation/quality/duration/timing/severity/associated sxs/prior Treatment) HPI   69 year old male with numerous complaints. Fatigue. Generalized body aches. Nausea and vomiting for the past day. Patient reports the symptoms have been ongoing and progressive since he was started on dialysis. Denies any fever. No SOB. Just feels achy all over. No specific pain complaints.   Past Medical History  Diagnosis Date  . Hypertension   . Ruptured cervical disc   . Fall resulting in striking against other object     paralyzed  . Multiple myeloma   . Recurrent multiple myeloma of bone marrow with unknown EBV status ([place])   . Multiple myeloma ([place]) 01/26/2011  . Unspecified vitamin D deficiency 03/06/2013  . Anemia of chronic disease 12/30/2013  . Constipation   . Dialysis patient ([place]) 2016  . Thrombocytopenia ([place]) 08/31/2014  . 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 Constipation    Past Surgical History  Procedure Laterality Date  . Inguinal hernia repair Bilateral   . Anterior cervical decomp/discectomy fusion    . Bone marrow aspirate and biopsy wiith lumbar puncture    . Melanoma excision      rt. breast  . Cervical spine surgery    . Kyphoplasty Bilateral 01/30/2014    Procedure: Thoracic eleven Kyphoplasty;  Surgeon: Consuella Lose, MD;  Location: [facility];  Service: Neurosurgery;  Laterality: Bilateral;  Thoracic eleven Kyphoplasty  . Av fistula placement Right 04/30/2014    Procedure: BRACHIOCEPHALIC ARTERIOVENOUS (AV) FISTULA CREATION;  Surgeon: Conrad Clarksburg, MD;  Location: [facility];  Service: Vascular;  Laterality: Right;  . Portacath placement Left  02/18/2015    Procedure: INSERTION OF PORT A CATH RIGHT INTERNAL JUGULAR VEIN ;  Surgeon: Conrad Baxter, MD;  Location: [place];  Service: Vascular;  Laterality: Left;   Family History  Problem Relation Age of Onset  . Cancer Sister   . Cancer Brother    Social History  Substance Use Topics  . Smoking status: Never Smoker   . Smokeless tobacco: Never Used  . Alcohol Use: No    Review of Systems  All systems reviewed and negative, other than as noted in HPI.   Allergies  Pamidronate  Home Medications   Prior to Admission medications   Medication Sig Start Date End Date Taking? Authorizing Provider  albuterol (PROVENTIL) (2.5 MG/3ML) 0.083% nebulizer solution Take 3 mLs (2.5 mg total) by nebulization every 6 (six) hours as needed for wheezing or shortness of breath. 09/28/14  Yes Baird Cancer, PA-C  calcium acetate (PHOSLO) 667 MG capsule Take 667-2,001 mg by mouth 4 (four) times daily. Take 3 capsules three times daily with meals and 1 capsule with snacks   Yes Historical Provider, MD  docusate sodium (COLACE) 100 MG capsule Take 100 mg by mouth daily.    Yes Historical Provider, MD  lenalidomide (REVLIMID) 5 MG capsule Take one tablet every other day, after dialysis 03/06/15  Yes Baird Cancer, PA-C  Omega-3 Fatty Acids (FISH OIL) 1000 MG CAPS Take 2 capsules by mouth daily.   Yes Historical Provider, MD  oxyCODONE-acetaminophen (ROXICET) 5-325 MG tablet Take 1-2 tablets by mouth every 6 (  six) hours as needed for moderate pain. 02/18/15  Yes Conrad [place], MD  pregabalin (LYRICA) 75 MG capsule Take 1 capsule (75 mg total) by mouth 2 (two) times daily. 09/21/14  Yes Manon Hilding Kefalas, PA-C  torsemide (DEMADEX) 20 MG tablet Take 40 mg by mouth daily.   Yes Historical Provider, MD  ondansetron (ZOFRAN) 8 MG tablet Take 1 tablet (8 mg total) by mouth every 8 (eight) hours as needed for nausea or vomiting. 03/15/15   Manon Hilding Kefalas, PA-C   BP 155/89 mmHg  Pulse 62  Temp(Src) 97.8 F  (36.6 C) (Oral)  Resp 24  Ht 5' 11"  (1.803 m)  Wt 240 lb (108.863 kg)  BMI 33.49 kg/m2  SpO2 94% Physical Exam  Constitutional: He appears well-developed and well-nourished. No distress.  Laying in bed. Tired appearing, but in no acute distress.  HENT:  Head: Normocephalic and atraumatic.  Eyes: Conjunctivae are normal. Right eye exhibits no discharge. Left eye exhibits no discharge.  Neck: Neck supple.  Cardiovascular: Normal rate, regular rhythm and normal heart sounds.  Exam reveals no gallop and no friction rub.   No murmur heard. Pulmonary/Chest: Effort normal and breath sounds normal. No respiratory distress.  Abdominal: Soft. He exhibits no distension. There is no tenderness.  Musculoskeletal: He exhibits no edema or tenderness.  Neurological: He is alert.  Skin: Skin is warm and dry.  Psychiatric: He has a normal mood and affect. His behavior is normal. Thought content normal.  Nursing note and vitals reviewed.   ED Course  Procedures (including critical care time) Labs Review Labs Reviewed  CBC WITH DIFFERENTIAL/PLATELET - Abnormal; Notable for the following:    WBC 1.7 (*)    RBC 2.16 (*)    Hemoglobin 7.8 (*)    HCT 23.6 (*)    MCV 109.3 (*)    MCH 36.1 (*)    RDW 19.3 (*)    Platelets 18 (*)    Neutro Abs 0.6 (*)    All other components within normal limits  COMPREHENSIVE METABOLIC PANEL - Abnormal; Notable for the following:    Glucose, Bld 108 (*)    BUN 38 (*)    Creatinine, Ser 7.29 (*)    Total Protein 6.4 (*)    Albumin 3.3 (*)    ALT 14 (*)    Alkaline Phosphatase 34 (*)    GFR calc non Af Amer 7 (*)    GFR calc Af Amer 8 (*)    All other components within normal limits    Imaging Review No results found. I have personally reviewed and evaluated these images and lab results as part of my medical decision-making.   EKG Interpretation None      MDM   Final diagnoses:  Weakness generalized  ESRD on dialysis ([place])  Pancytopenia ([place])     69 year old male with generalized weakness. Afebrile. Likely multifactorial. Pancytopenia noted. Relatively stable. His heart rate is normal. He is actually hypertensive. He is not on any rate controlling medications. Patient has many significant findings on workup, many of these appear to be chronic to some degree. This time I feel is stable for discharge. Close return precautions were discussed.    Virgel Manifold, MD 03/24/15 2256

## 2015-03-26 ENCOUNTER — Encounter: Payer: Self-pay | Admitting: Vascular Surgery

## 2015-03-29 ENCOUNTER — Encounter: Payer: Medicare Other | Admitting: Vascular Surgery

## 2015-03-29 ENCOUNTER — Encounter (HOSPITAL_BASED_OUTPATIENT_CLINIC_OR_DEPARTMENT_OTHER): Payer: Medicare Other

## 2015-03-29 ENCOUNTER — Other Ambulatory Visit (HOSPITAL_COMMUNITY): Payer: Medicare Other

## 2015-03-29 DIAGNOSIS — C9000 Multiple myeloma not having achieved remission: Secondary | ICD-10-CM | POA: Diagnosis not present

## 2015-03-29 LAB — COMPREHENSIVE METABOLIC PANEL
ALBUMIN: 3.5 g/dL (ref 3.5–5.0)
ALK PHOS: 35 U/L — AB (ref 38–126)
ALT: 17 U/L (ref 17–63)
AST: 21 U/L (ref 15–41)
Anion gap: 10 (ref 5–15)
BILIRUBIN TOTAL: 0.7 mg/dL (ref 0.3–1.2)
BUN: 38 mg/dL — AB (ref 6–20)
CALCIUM: 9 mg/dL (ref 8.9–10.3)
CO2: 28 mmol/L (ref 22–32)
CREATININE: 5.53 mg/dL — AB (ref 0.61–1.24)
Chloride: 100 mmol/L — ABNORMAL LOW (ref 101–111)
GFR calc Af Amer: 11 mL/min — ABNORMAL LOW (ref >60)
GFR calc non Af Amer: 10 mL/min — ABNORMAL LOW (ref >60)
GLUCOSE: 98 mg/dL (ref 65–99)
Potassium: 4 mmol/L (ref 3.5–5.1)
Sodium: 138 mmol/L (ref 135–145)
TOTAL PROTEIN: 6.7 g/dL (ref 6.5–8.1)

## 2015-03-29 LAB — LACTATE DEHYDROGENASE: LDH: 182 U/L (ref 98–192)

## 2015-03-29 LAB — C-REACTIVE PROTEIN: CRP: 0.8 mg/dL (ref <1.0)

## 2015-03-29 LAB — SEDIMENTATION RATE: Sed Rate: 24 mm/hr — ABNORMAL HIGH (ref 0–16)

## 2015-03-29 NOTE — Addendum Note (Signed)
Addended by: Kurtis Bushman A on: 03/29/2015 02:42 PM   Modules accepted: Orders, SmartSet

## 2015-03-29 NOTE — Progress Notes (Signed)
..  [name]'s reason for visit today is for labs as scheduled per MD orders.  Venipuncture performed with a 23 gauge butterfly needle to L Antecubital.  [name] tolerated procedure well and without incident; questions were answered and patient was discharged.

## 2015-03-29 NOTE — Addendum Note (Signed)
Addended by: Kurtis Bushman A on: 03/29/2015 02:44 PM   Modules accepted: Orders

## 2015-03-30 LAB — BETA 2 MICROGLOBULIN, SERUM: Beta-2 Microglobulin: 12.2 mg/L — ABNORMAL HIGH (ref 0.6–2.4)

## 2015-03-31 LAB — KAPPA/LAMBDA LIGHT CHAINS
Kappa free light chain: 248.38 mg/L — ABNORMAL HIGH (ref 3.30–19.40)
Kappa, lambda light chain ratio: 2.6 — ABNORMAL HIGH (ref 0.26–1.65)
Lambda free light chains: 95.66 mg/L — ABNORMAL HIGH (ref 5.71–26.30)

## 2015-04-01 ENCOUNTER — Encounter (HOSPITAL_BASED_OUTPATIENT_CLINIC_OR_DEPARTMENT_OTHER): Payer: Medicare Other

## 2015-04-01 ENCOUNTER — Encounter (HOSPITAL_COMMUNITY): Payer: Self-pay

## 2015-04-01 VITALS — BP 155/75 | HR 64 | Temp 98.0°F | Resp 20

## 2015-04-01 DIAGNOSIS — D696 Thrombocytopenia, unspecified: Secondary | ICD-10-CM

## 2015-04-01 DIAGNOSIS — C9000 Multiple myeloma not having achieved remission: Secondary | ICD-10-CM

## 2015-04-01 LAB — MULTIPLE MYELOMA PANEL, SERUM
ALBUMIN/GLOB SERPL: 1.5 (ref 0.7–1.7)
ALPHA2 GLOB SERPL ELPH-MCNC: 0.4 g/dL (ref 0.4–1.0)
Albumin SerPl Elph-Mcnc: 3.7 g/dL (ref 2.9–4.4)
Alpha 1: 0.3 g/dL (ref 0.0–0.4)
B-GLOBULIN SERPL ELPH-MCNC: 0.9 g/dL (ref 0.7–1.3)
GAMMA GLOB SERPL ELPH-MCNC: 1 g/dL (ref 0.4–1.8)
Globulin, Total: 2.6 g/dL (ref 2.2–3.9)
IGG (IMMUNOGLOBIN G), SERUM: 899 mg/dL (ref 700–1600)
IgA: 340 mg/dL (ref 61–437)
IgM, Serum: 87 mg/dL (ref 20–172)
Total Protein ELP: 6.3 g/dL (ref 6.0–8.5)

## 2015-04-01 MED ORDER — HEPARIN SOD (PORK) LOCK FLUSH 100 UNIT/ML IV SOLN
500.0000 [IU] | Freq: Every day | INTRAVENOUS | Status: AC | PRN
  Administered 2015-04-01: 500 [IU]
  Filled 2015-04-01: qty 5

## 2015-04-01 MED ORDER — SODIUM CHLORIDE 0.9 % IJ SOLN: 10.0000 mL | INTRAMUSCULAR | Status: DC | PRN

## 2015-04-01 MED ORDER — DIPHENHYDRAMINE HCL 25 MG PO CAPS
ORAL_CAPSULE | ORAL | Status: AC
  Filled 2015-04-01: qty 1

## 2015-04-01 MED ORDER — SODIUM CHLORIDE 0.9 % IV SOLN
250.0000 mL | Freq: Once | INTRAVENOUS | Status: AC
  Administered 2015-04-01: 250 mL via INTRAVENOUS

## 2015-04-01 MED ORDER — DIPHENHYDRAMINE HCL 25 MG PO CAPS
25.0000 mg | ORAL_CAPSULE | Freq: Once | ORAL | Status: AC
  Administered 2015-04-01: 25 mg via ORAL

## 2015-04-01 MED ORDER — ACETAMINOPHEN 325 MG PO TABS
650.0000 mg | ORAL_TABLET | Freq: Once | ORAL | Status: AC
  Administered 2015-04-01: 650 mg via ORAL

## 2015-04-01 MED ORDER — ACETAMINOPHEN 325 MG PO TABS
ORAL_TABLET | ORAL | Status: AC
  Filled 2015-04-01: qty 2

## 2015-04-01 NOTE — Patient Instructions (Signed)
[facility] at [facility] Discharge Instructions  RECOMMENDATIONS MADE BY THE CONSULTANT AND ANY TEST RESULTS WILL BE SENT TO YOUR REFERRING PHYSICIAN.  Platelets today Return as scheduled Please call the clinic if you have any questions or concerns  Thank you for choosing [facility] at [facility] to provide your oncology and hematology care.  To afford each patient quality time with our provider, please arrive at least 15 minutes before your scheduled appointment time.    You need to re-schedule your appointment should you arrive 10 or more minutes late.  We strive to give you quality time with our providers, and arriving late affects you and other patients whose appointments are after yours.  Also, if you no show three or more times for appointments you may be dismissed from the clinic at the providers discretion.     Again, thank you for choosing [facility].  Our hope is that these requests will decrease the amount of time that you wait before being seen by our physicians.       _____________________________________________________________  Should you have questions after your visit to [facility], please contact our office at [phone] between the hours of 8:30 a.m. and 4:30 p.m.  Voicemails left after 4:30 p.m. will not be returned until the following business day.  For prescription refill requests, have your pharmacy contact our office.    

## 2015-04-01 NOTE — Progress Notes (Signed)
[name] Tolerated platelets today Discharged ambulatory

## 2015-04-02 LAB — CBC
HEMATOCRIT: 28.6 % — AB (ref 39.0–52.0)
HEMOGLOBIN: 9.2 g/dL — AB (ref 13.0–17.0)
MCH: 34.7 pg — ABNORMAL HIGH (ref 26.0–34.0)
MCHC: 32.2 g/dL (ref 30.0–36.0)
MCV: 107.9 fL — ABNORMAL HIGH (ref 78.0–100.0)
Platelets: 19 10*3/uL — CL (ref 150–400)
RBC: 2.65 MIL/uL — ABNORMAL LOW (ref 4.22–5.81)
RDW: 22.6 % — ABNORMAL HIGH (ref 11.5–15.5)
WBC: 2.8 10*3/uL — AB (ref 4.0–10.5)

## 2015-04-02 LAB — PREPARE PLATELET PHERESIS: UNIT DIVISION: 0

## 2015-04-08 ENCOUNTER — Telehealth (HOSPITAL_COMMUNITY): Payer: Self-pay

## 2015-04-08 ENCOUNTER — Encounter (HOSPITAL_BASED_OUTPATIENT_CLINIC_OR_DEPARTMENT_OTHER): Payer: Medicare Other

## 2015-04-08 DIAGNOSIS — C9000 Multiple myeloma not having achieved remission: Secondary | ICD-10-CM

## 2015-04-08 LAB — CBC WITH DIFFERENTIAL/PLATELET
Basophils Absolute: 0 10*3/uL (ref 0.0–0.1)
Basophils Relative: 0 %
EOS PCT: 5 %
Eosinophils Absolute: 0.1 10*3/uL (ref 0.0–0.7)
HEMATOCRIT: 27.1 % — AB (ref 39.0–52.0)
Hemoglobin: 8.8 g/dL — ABNORMAL LOW (ref 13.0–17.0)
LYMPHS ABS: 1.4 10*3/uL (ref 0.7–4.0)
LYMPHS PCT: 60 %
MCH: 36.2 pg — ABNORMAL HIGH (ref 26.0–34.0)
MCHC: 32.5 g/dL (ref 30.0–36.0)
MCV: 111.5 fL — AB (ref 78.0–100.0)
MONO ABS: 0.1 10*3/uL (ref 0.1–1.0)
Monocytes Relative: 5 %
Neutro Abs: 0.7 10*3/uL — ABNORMAL LOW (ref 1.7–7.7)
Neutrophils Relative %: 30 %
Platelets: 24 10*3/uL — CL (ref 150–400)
RBC: 2.43 MIL/uL — ABNORMAL LOW (ref 4.22–5.81)
RDW: 21.2 % — AB (ref 11.5–15.5)
Smear Review: DECREASED
WBC: 2.3 10*3/uL — ABNORMAL LOW (ref 4.0–10.5)

## 2015-04-08 LAB — PREPARE RBC (CROSSMATCH)

## 2015-04-08 NOTE — Progress Notes (Signed)
Labs drawn

## 2015-04-08 NOTE — Telephone Encounter (Signed)
CRITICAL VALUE ALERT Critical value received:  Platelets 24,000  Date of notification:  04/08/15  Time of notification: [id number]  Critical value read back:  Yes.   Nurse who received alert:  Mickie Kay, RN MD notified (1st page):  Robynn Pane, PA-C notified at [phone]

## 2015-04-09 NOTE — Patient Instructions (Signed)
Your procedure is scheduled on :04/15/2015  Report to [facility] at  1100  AM.  Call this number if you have problems the morning of surgery: [phone]   Do not eat food or drink liquids :After Midnight.      Take these medicines the morning of surgery with A SIP OF WATER: zofran, oxycodone, lyrica.Take your nebulizer before you come.   Do not wear jewelry, make-up or nail polish.  Do not wear lotions, powders, or perfumes. You may wear deodorant.  Do not shave 48 hours prior to surgery.  Do not bring valuables to the hospital.  Contacts, dentures or bridgework may not be worn into surgery.  Leave suitcase in the car. After surgery it may be brought to your room.  For patients admitted to the hospital, checkout time is 11:00 AM the day of discharge.   Patients discharged the day of surgery will not be allowed to drive home.  :     Please read over the following fact sheets that you were given: Coughing and Deep Breathing, Surgical Site Infection Prevention, Anesthesia Post-op Instructions and Care and Recovery After Surgery    Cataract A cataract is a clouding of the lens of the eye. When a lens becomes cloudy, vision is reduced based on the degree and nature of the clouding. Many cataracts reduce vision to some degree. Some cataracts make people more near-sighted as they develop. Other cataracts increase glare. Cataracts that are ignored and become worse can sometimes look white. The white color can be seen through the pupil. CAUSES   Aging. However, cataracts may occur at any age, even in newborns.   Certain drugs.   Trauma to the eye.   Certain diseases such as diabetes.   Specific eye diseases such as chronic inflammation inside the eye or a sudden attack of a rare form of glaucoma.   Inherited or acquired medical problems.  SYMPTOMS   Gradual, progressive drop in vision in the affected eye.   Severe, rapid visual loss. This most often happens when trauma is the cause.    DIAGNOSIS  To detect a cataract, an eye doctor examines the lens. Cataracts are best diagnosed with an exam of the eyes with the pupils enlarged (dilated) by drops.  TREATMENT  For an early cataract, vision may improve by using different eyeglasses or stronger lighting. If that does not help your vision, surgery is the only effective treatment. A cataract needs to be surgically removed when vision loss interferes with your everyday activities, such as driving, reading, or watching TV. A cataract may also have to be removed if it prevents examination or treatment of another eye problem. Surgery removes the cloudy lens and usually replaces it with a substitute lens (intraocular lens, IOL).  At a time when both you and your doctor agree, the cataract will be surgically removed. If you have cataracts in both eyes, only one is usually removed at a time. This allows the operated eye to heal and be out of danger from any possible problems after surgery (such as infection or poor wound healing). In rare cases, a cataract may be doing damage to your eye. In these cases, your caregiver may advise surgical removal right away. The vast majority of people who have cataract surgery have better vision afterward. HOME CARE INSTRUCTIONS  If you are not planning surgery, you may be asked to do the following:  Use different eyeglasses.   Use stronger or brighter lighting.   Ask your  eye doctor about reducing your medicine dose or changing medicines if it is thought that a medicine caused your cataract. Changing medicines does not make the cataract go away on its own.   Become familiar with your surroundings. Poor vision can lead to injury. Avoid bumping into things on the affected side. You are at a higher risk for tripping or falling.   Exercise extreme care when driving or operating machinery.   Wear sunglasses if you are sensitive to bright light or experiencing problems with glare.  SEEK IMMEDIATE MEDICAL CARE  IF:   You have a worsening or sudden vision loss.   You notice redness, swelling, or increasing pain in the eye.   You have a fever.  Document Released: 05/04/2005 Document Revised: 04/23/2011 Document Reviewed: 12/26/2010 [facility] Patient Information 2012 [employer].PATIENT INSTRUCTIONS POST-ANESTHESIA  IMMEDIATELY FOLLOWING SURGERY:  Do not drive or operate machinery for the first twenty four hours after surgery.  Do not make any important decisions for twenty four hours after surgery or while taking narcotic pain medications or sedatives.  If you develop intractable nausea and vomiting or a severe headache please notify your doctor immediately.  FOLLOW-UP:  Please make an appointment with your surgeon as instructed. You do not need to follow up with anesthesia unless specifically instructed to do so.  WOUND CARE INSTRUCTIONS (if applicable):  Keep a dry clean dressing on the anesthesia/puncture wound site if there is drainage.  Once the wound has quit draining you may leave it open to air.  Generally you should leave the bandage intact for twenty four hours unless there is drainage.  If the epidural site drains for more than 36-48 hours please call the anesthesia department.  QUESTIONS?:  Please feel free to call your physician or the hospital operator if you have any questions, and they will be happy to assist you.

## 2015-04-10 ENCOUNTER — Other Ambulatory Visit (HOSPITAL_COMMUNITY): Payer: Self-pay | Admitting: Oncology

## 2015-04-10 ENCOUNTER — Other Ambulatory Visit (HOSPITAL_COMMUNITY): Payer: Medicare Other

## 2015-04-10 ENCOUNTER — Other Ambulatory Visit (HOSPITAL_COMMUNITY): Payer: Self-pay

## 2015-04-10 ENCOUNTER — Encounter (HOSPITAL_COMMUNITY): Payer: Self-pay

## 2015-04-10 ENCOUNTER — Ambulatory Visit (HOSPITAL_COMMUNITY): Payer: Medicare Other | Admitting: Oncology

## 2015-04-10 ENCOUNTER — Encounter (HOSPITAL_BASED_OUTPATIENT_CLINIC_OR_DEPARTMENT_OTHER): Payer: Medicare Other

## 2015-04-10 ENCOUNTER — Encounter (HOSPITAL_COMMUNITY)
Admission: RE | Admit: 2015-04-10 | Discharge: 2015-04-10 | Disposition: A | Discharge status: Home / Self Care | Payer: Medicare Other | Source: Ambulatory Visit | Attending: Ophthalmology | Admitting: Ophthalmology

## 2015-04-10 DIAGNOSIS — C9000 Multiple myeloma not having achieved remission: Secondary | ICD-10-CM | POA: Diagnosis not present

## 2015-04-10 DIAGNOSIS — G629 Polyneuropathy, unspecified: Secondary | ICD-10-CM

## 2015-04-10 MED ORDER — SODIUM CHLORIDE 0.9 % IV SOLN
250.0000 mL | Freq: Once | INTRAVENOUS | Status: AC
  Administered 2015-04-10: 250 mL via INTRAVENOUS

## 2015-04-10 MED ORDER — DIPHENHYDRAMINE HCL 25 MG PO CAPS
ORAL_CAPSULE | ORAL | Status: AC
  Filled 2015-04-10: qty 1

## 2015-04-10 MED ORDER — ACETAMINOPHEN 325 MG PO TABS
650.0000 mg | ORAL_TABLET | Freq: Once | ORAL | Status: AC
  Administered 2015-04-10: 650 mg via ORAL

## 2015-04-10 MED ORDER — HEPARIN SOD (PORK) LOCK FLUSH 100 UNIT/ML IV SOLN
INTRAVENOUS | Status: AC
  Filled 2015-04-10: qty 5

## 2015-04-10 MED ORDER — PREGABALIN 75 MG PO CAPS: 75.0000 mg | ORAL_CAPSULE | Freq: Two times a day (BID) | ORAL | Status: DC

## 2015-04-10 MED ORDER — ACETAMINOPHEN 325 MG PO TABS
ORAL_TABLET | ORAL | Status: AC
  Filled 2015-04-10: qty 2

## 2015-04-10 MED ORDER — SODIUM CHLORIDE 0.9 % IJ SOLN
10.0000 mL | INTRAMUSCULAR | Status: AC | PRN
  Administered 2015-04-10: 10 mL

## 2015-04-10 MED ORDER — DIPHENHYDRAMINE HCL 25 MG PO CAPS
25.0000 mg | ORAL_CAPSULE | Freq: Once | ORAL | Status: AC
  Administered 2015-04-10: 25 mg via ORAL

## 2015-04-10 MED ORDER — HEPARIN SOD (PORK) LOCK FLUSH 100 UNIT/ML IV SOLN
500.0000 [IU] | Freq: Every day | INTRAVENOUS | Status: AC | PRN
  Administered 2015-04-10: 500 [IU]

## 2015-04-10 MED ORDER — HYDROCODONE-ACETAMINOPHEN 5-325 MG PO TABS: 1.0000 | ORAL_TABLET | Freq: Four times a day (QID) | ORAL | Status: DC | PRN

## 2015-04-10 NOTE — Telephone Encounter (Signed)
Please call in a refill of Lyrica for [place]us.  #60 with 1 refill with same directions as previously prescribed.  Robynn Pane, PA-C 04/10/2015 11:49 AM

## 2015-04-10 NOTE — Telephone Encounter (Signed)
Called to [facility] 

## 2015-04-10 NOTE — Patient Instructions (Signed)
[place] at [facility] Discharge Instructions  RECOMMENDATIONS MADE BY THE CONSULTANT AND ANY TEST RESULTS WILL BE SENT TO YOUR REFERRING PHYSICIAN.  Today you received a blood transfusion as ordered. Return as scheduled.  Thank you for choosing [place] at [facility] to provide your oncology and hematology care.  To afford each patient quality time with our provider, please arrive at least 15 minutes before your scheduled appointment time.    You need to re-schedule your appointment should you arrive 10 or more minutes late.  We strive to give you quality time with our providers, and arriving late affects you and other patients whose appointments are after yours.  Also, if you no show three or more times for appointments you may be dismissed from the clinic at the providers discretion.     Again, thank you for choosing [facility].  Our hope is that these requests will decrease the amount of time that you wait before being seen by our physicians.       _____________________________________________________________  Should you have questions after your visit to [facility], please contact our office at [phone] between the hours of 8:30 a.m. and 4:30 p.m.  Voicemails left after 4:30 p.m. will not be returned until the following business day.  For prescription refill requests, have your pharmacy contact our office.

## 2015-04-10 NOTE — Progress Notes (Signed)
Tolerated blood transfusion well. Ambulatory on discharge home to self.

## 2015-04-11 LAB — TYPE AND SCREEN
ABO/RH(D): A POS
Antibody Screen: NEGATIVE
UNIT DIVISION: 0

## 2015-04-15 ENCOUNTER — Encounter (HOSPITAL_COMMUNITY): Payer: Self-pay | Admitting: *Deleted

## 2015-04-15 ENCOUNTER — Ambulatory Visit (HOSPITAL_COMMUNITY): Payer: Medicare Other | Admitting: Anesthesiology

## 2015-04-15 ENCOUNTER — Ambulatory Visit (HOSPITAL_COMMUNITY)
Admission: RE | Admit: 2015-04-15 | Discharge: 2015-04-15 | Disposition: A | Discharge status: Home / Self Care | Payer: Medicare Other | Source: Ambulatory Visit | Attending: Ophthalmology | Admitting: Ophthalmology

## 2015-04-15 ENCOUNTER — Encounter (HOSPITAL_COMMUNITY)
Admission: RE | Disposition: A | Discharge status: Home / Self Care | Payer: Self-pay | Source: Ambulatory Visit | Attending: Ophthalmology

## 2015-04-15 DIAGNOSIS — H25811 Combined forms of age-related cataract, right eye: Secondary | ICD-10-CM | POA: Insufficient documentation

## 2015-04-15 DIAGNOSIS — Z9481 Bone marrow transplant status: Secondary | ICD-10-CM | POA: Diagnosis not present

## 2015-04-15 DIAGNOSIS — Z79899 Other long term (current) drug therapy: Secondary | ICD-10-CM | POA: Diagnosis not present

## 2015-04-15 DIAGNOSIS — Z992 Dependence on renal dialysis: Secondary | ICD-10-CM | POA: Diagnosis not present

## 2015-04-15 DIAGNOSIS — N186 End stage renal disease: Secondary | ICD-10-CM | POA: Insufficient documentation

## 2015-04-15 DIAGNOSIS — C9001 Multiple myeloma in remission: Secondary | ICD-10-CM | POA: Insufficient documentation

## 2015-04-15 DIAGNOSIS — I120 Hypertensive chronic kidney disease with stage 5 chronic kidney disease or end stage renal disease: Secondary | ICD-10-CM | POA: Insufficient documentation

## 2015-04-15 HISTORY — PX: CATARACT EXTRACTION W/PHACO: SHX586

## 2015-04-15 LAB — POCT I-STAT 4, (NA,K, GLUC, HGB,HCT)
GLUCOSE: 94 mg/dL (ref 65–99)
HCT: 30 % — ABNORMAL LOW (ref 39.0–52.0)
HEMOGLOBIN: 10.2 g/dL — AB (ref 13.0–17.0)
POTASSIUM: 5 mmol/L (ref 3.5–5.1)
Sodium: 139 mmol/L (ref 135–145)

## 2015-04-15 SURGERY — PHACOEMULSIFICATION, CATARACT, WITH IOL INSERTION
Anesthesia: Monitor Anesthesia Care | Site: Eye | Laterality: Right

## 2015-04-15 MED ORDER — ALTEPLASE 2 MG IJ SOLR: 2.0000 mg | Freq: Once | INTRAMUSCULAR | Status: DC | PRN

## 2015-04-15 MED ORDER — FENTANYL CITRATE (PF) 100 MCG/2ML IJ SOLN
INTRAMUSCULAR | Status: AC
  Filled 2015-04-15: qty 2

## 2015-04-15 MED ORDER — TETRACAINE HCL 0.5 % OP SOLN
1.0000 [drp] | OPHTHALMIC | Status: AC
  Administered 2015-04-15 (×3): 1 [drp] via OPHTHALMIC

## 2015-04-15 MED ORDER — MIDAZOLAM HCL 2 MG/2ML IJ SOLN
1.0000 mg | INTRAMUSCULAR | Status: DC | PRN
  Administered 2015-04-15: 1 mg via INTRAVENOUS

## 2015-04-15 MED ORDER — EPINEPHRINE HCL 1 MG/ML IJ SOLN
INTRAMUSCULAR | Status: AC
  Filled 2015-04-15: qty 1

## 2015-04-15 MED ORDER — EPINEPHRINE HCL 1 MG/ML IJ SOLN
INTRAOCULAR | Status: DC | PRN
  Administered 2015-04-15: 12:00:00

## 2015-04-15 MED ORDER — SODIUM CHLORIDE 0.9 % IV SOLN
100.0000 mL | INTRAVENOUS | Status: DC | PRN
  Administered 2015-04-15: 12:00:00 via INTRAVENOUS

## 2015-04-15 MED ORDER — LIDOCAINE HCL 3.5 % OP GEL
1.0000 "application " | Freq: Once | OPHTHALMIC | Status: AC
  Administered 2015-04-15: 1 via OPHTHALMIC

## 2015-04-15 MED ORDER — LIDOCAINE 3.5 % OP GEL OPTIME - NO CHARGE
OPHTHALMIC | Status: DC | PRN
  Administered 2015-04-15: 2 [drp] via OPHTHALMIC

## 2015-04-15 MED ORDER — LIDOCAINE HCL (PF) 1 % IJ SOLN
INTRAMUSCULAR | Status: DC | PRN
  Administered 2015-04-15: .8 mL

## 2015-04-15 MED ORDER — BSS IO SOLN
INTRAOCULAR | Status: DC | PRN
  Administered 2015-04-15: 15 mL via INTRAOCULAR

## 2015-04-15 MED ORDER — MIDAZOLAM HCL 2 MG/2ML IJ SOLN
INTRAMUSCULAR | Status: AC
  Filled 2015-04-15: qty 2

## 2015-04-15 MED ORDER — PHENYLEPHRINE HCL 2.5 % OP SOLN
1.0000 [drp] | OPHTHALMIC | Status: AC
  Administered 2015-04-15 (×3): 1 [drp] via OPHTHALMIC

## 2015-04-15 MED ORDER — SODIUM CHLORIDE 0.9 % IV SOLN: INTRAVENOUS | Status: DC

## 2015-04-15 MED ORDER — CYCLOPENTOLATE-PHENYLEPHRINE 0.2-1 % OP SOLN
1.0000 [drp] | OPHTHALMIC | Status: AC
  Administered 2015-04-15 (×3): 1 [drp] via OPHTHALMIC

## 2015-04-15 MED ORDER — PROVISC 10 MG/ML IO SOLN
INTRAOCULAR | Status: DC | PRN
  Administered 2015-04-15: 0.85 mL via INTRAOCULAR

## 2015-04-15 MED ORDER — SODIUM CHLORIDE 0.9 % IV SOLN: 100.0000 mL | INTRAVENOUS | Status: DC | PRN

## 2015-04-15 MED ORDER — FENTANYL CITRATE (PF) 100 MCG/2ML IJ SOLN
25.0000 ug | INTRAMUSCULAR | Status: AC
  Administered 2015-04-15: 25 ug via INTRAVENOUS

## 2015-04-15 MED ORDER — POVIDONE-IODINE 5 % OP SOLN
OPHTHALMIC | Status: DC | PRN
  Administered 2015-04-15: 1 via OPHTHALMIC

## 2015-04-15 SURGICAL SUPPLY — 11 items
CLOTH BEACON ORANGE TIMEOUT ST (SAFETY) ×2 IMPLANT
EYE SHIELD UNIVERSAL CLEAR (GAUZE/BANDAGES/DRESSINGS) ×2 IMPLANT
GLOVE BIOGEL PI IND STRL 6.5 (GLOVE) IMPLANT
GLOVE BIOGEL PI INDICATOR 6.5 (GLOVE) ×2
GLOVE EXAM NITRILE MD LF STRL (GLOVE) ×2 IMPLANT
PAD ARMBOARD 7.5X6 YLW CONV (MISCELLANEOUS) ×2 IMPLANT
SIGHTPATH CAT PROC W REG LENS (Ophthalmic Related) ×3 IMPLANT
SYRINGE LUER LOK 1CC (MISCELLANEOUS) ×2 IMPLANT
TAPE SURG TRANSPARENT 2IN (GAUZE/BANDAGES/DRESSINGS) IMPLANT
TAPE TRANSPARENT 2IN (GAUZE/BANDAGES/DRESSINGS) ×2
WATER STERILE IRR 250ML POUR (IV SOLUTION) ×2 IMPLANT

## 2015-04-15 NOTE — Anesthesia Postprocedure Evaluation (Signed)
Anesthesia Post Note  Patient: [name]  Procedure(s) Performed: Procedure(s) (LRB): CATARACT EXTRACTION PHACO AND INTRAOCULAR LENS PLACEMENT (IOC) (Right)  Patient location during evaluation: Short Stay Anesthesia Type: MAC Level of consciousness: awake and alert Pain management: satisfactory to patient Vital Signs Assessment: post-procedure vital signs reviewed and stable Respiratory status: spontaneous breathing and nonlabored ventilation Cardiovascular status: stable Postop Assessment: Adequate PO intake Anesthetic complications: no    Last Vitals:  Filed Vitals:   04/15/15 1215 04/15/15 1235  BP: 136/79 161/91  Pulse:  71  Temp:  36.4 C  Resp: 14 16    Last Pain: There were no vitals filed for this visit.               Cherrise Occhipinti J

## 2015-04-15 NOTE — Op Note (Signed)
Date of Admission: 04/15/2015  Date of Surgery: 04/15/2015   Pre-Op Dx: Cataract Right Eye  Post-Op Dx: Senile Combined Cataract Right  Eye,  Dx Code [id number]  Surgeon: Tonny Branch, M.D.  Assistants: None  Anesthesia: Topical with MAC  Indications: Painless, progressive loss of vision with compromise of daily activities.  Surgery: Cataract Extraction with Intraocular lens Implant Right Eye  Discription: The patient had dilating drops and viscous lidocaine placed into the Right eye in the pre-op holding area. After transfer to the operating room, a time out was performed. The patient was then prepped and draped. Beginning with a 82 degree blade a paracentesis port was made at the surgeon's 2 o'clock position. The anterior chamber was then filled with 1% non-preserved lidocaine. This was followed by filling the anterior chamber with Provisc.  A 2.34mm keratome blade was used to make a clear corneal incision at the temporal limbus.  A bent cystatome needle was used to create a continuous tear capsulotomy. Hydrodissection was performed with balanced salt solution on a Fine canula. The lens nucleus was then removed using the phacoemulsification handpiece. Residual cortex was removed with the I&A handpiece. The anterior chamber and capsular bag were refilled with Provisc. A posterior chamber intraocular lens was placed into the capsular bag with it's injector. The implant was positioned with the Kuglan hook. The Provisc was then removed from the anterior chamber and capsular bag with the I&A handpiece. Stromal hydration of the main incision and paracentesis port was performed with BSS on a Fine canula. The wounds were tested for leak which was negative. The patient tolerated the procedure well. There were no operative complications. The patient was then transferred to the recovery room in stable condition.  Complications: None  Specimen: None  EBL: None  Prosthetic device: Hoya iSert 250, power 23.5  D, SN [id number].

## 2015-04-15 NOTE — Discharge Instructions (Signed)

## 2015-04-15 NOTE — Anesthesia Preprocedure Evaluation (Signed)
Anesthesia Evaluation  Patient identified by MRN, date of birth, ID band Patient awake    Reviewed: Allergy & Precautions, NPO status , Patient's Chart, lab work & pertinent test results  Airway Mallampati: II  TM Distance: >3 FB     Dental  (+) Partial Lower, Partial Upper   Pulmonary neg pulmonary ROS,    breath sounds clear to auscultation       Cardiovascular hypertension,  Rhythm:Regular Rate:Normal     Neuro/Psych    GI/Hepatic   Endo/Other    Renal/GU ESRF and DialysisRenal disease     Musculoskeletal   Abdominal   Peds  Hematology   Anesthesia Other Findings Multiple myeloma in remission   Reproductive/Obstetrics                             Anesthesia Physical Anesthesia Plan  ASA: III  Anesthesia Plan: MAC   Post-op Pain Management:    Induction: Intravenous  Airway Management Planned: Nasal Cannula  Additional Equipment:   Intra-op Plan:   Post-operative Plan:   Informed Consent: I have reviewed the patients History and Physical, chart, labs and discussed the procedure including the risks, benefits and alternatives for the proposed anesthesia with the patient or authorized representative who has indicated his/her understanding and acceptance.     Plan Discussed with:   Anesthesia Plan Comments:         Anesthesia Quick Evaluation  

## 2015-04-15 NOTE — Transfer of Care (Signed)
Immediate Anesthesia Transfer of Care Note  Patient: [name]  Procedure(s) Performed: Procedure(s) with comments: CATARACT EXTRACTION PHACO AND INTRAOCULAR LENS PLACEMENT (IOC) (Right) - CDE:8.97  Patient Location: Short Stay  Anesthesia Type:MAC  Level of Consciousness: awake, alert , oriented and patient cooperative  Airway & Oxygen Therapy: Patient Spontanous Breathing  Post-op Assessment: Report given to RN, Post -op Vital signs reviewed and stable and Patient moving all extremities  Post vital signs: Reviewed and stable  Last Vitals:  Filed Vitals:   04/15/15 1210 04/15/15 1215  BP: 139/73 136/79  Pulse:    Temp:    Resp: 12 14    Complications: No apparent anesthesia complications

## 2015-04-15 NOTE — H&P (Signed)
I have reviewed the H&P, the patient was re-examined, and I have identified no interval changes in medical condition and plan of care since the history and physical of record  

## 2015-04-16 ENCOUNTER — Encounter (HOSPITAL_COMMUNITY): Payer: Self-pay | Admitting: Ophthalmology

## 2015-04-16 ENCOUNTER — Other Ambulatory Visit (HOSPITAL_COMMUNITY): Payer: Self-pay | Admitting: Oncology

## 2015-04-16 DIAGNOSIS — C9000 Multiple myeloma not having achieved remission: Secondary | ICD-10-CM

## 2015-04-16 MED ORDER — NEOMYCIN-POLYMYXIN-DEXAMETH 3.5-10000-0.1 OP SUSP
OPHTHALMIC | Status: DC | PRN
  Administered 2015-04-15: 2 [drp] via OPHTHALMIC

## 2015-04-17 ENCOUNTER — Other Ambulatory Visit (HOSPITAL_COMMUNITY): Payer: Medicare Other

## 2015-04-19 ENCOUNTER — Ambulatory Visit (HOSPITAL_COMMUNITY): Payer: Medicare Other | Admitting: Oncology

## 2015-04-19 ENCOUNTER — Encounter (HOSPITAL_COMMUNITY): Payer: Medicare Other | Attending: Oncology | Admitting: Oncology

## 2015-04-19 ENCOUNTER — Encounter (HOSPITAL_COMMUNITY): Payer: Self-pay | Admitting: Oncology

## 2015-04-19 ENCOUNTER — Ambulatory Visit (HOSPITAL_COMMUNITY): Payer: Medicare Other

## 2015-04-19 ENCOUNTER — Other Ambulatory Visit (HOSPITAL_COMMUNITY): Payer: Medicare Other

## 2015-04-19 VITALS — BP 159/79 | HR 64 | Temp 97.9°F | Resp 18 | Wt 245.6 lb

## 2015-04-19 DIAGNOSIS — M542 Cervicalgia: Secondary | ICD-10-CM | POA: Diagnosis not present

## 2015-04-19 DIAGNOSIS — C9000 Multiple myeloma not having achieved remission: Secondary | ICD-10-CM | POA: Diagnosis not present

## 2015-04-19 DIAGNOSIS — Z9484 Stem cells transplant status: Secondary | ICD-10-CM | POA: Diagnosis not present

## 2015-04-19 DIAGNOSIS — Z992 Dependence on renal dialysis: Secondary | ICD-10-CM | POA: Diagnosis not present

## 2015-04-19 NOTE — Patient Instructions (Signed)
[place] at [facility] Discharge Instructions  RECOMMENDATIONS MADE BY THE CONSULTANT AND ANY TEST RESULTS WILL BE SENT TO YOUR REFERRING PHYSICIAN.  Exam and discussion by Robynn Pane, PA-c No changes today Call with any concerns  Follow-up: Labs in 1 week, 3 weeks 5 week and 7 weeks Office visit in 7 weeks.  Thank you for choosing [place] at [facility] to provide your oncology and hematology care.  To afford each patient quality time with our provider, please arrive at least 15 minutes before your scheduled appointment time.    You need to re-schedule your appointment should you arrive 10 or more minutes late.  We strive to give you quality time with our providers, and arriving late affects you and other patients whose appointments are after yours.  Also, if you no show three or more times for appointments you may be dismissed from the clinic at the providers discretion.     Again, thank you for choosing [facility].  Our hope is that these requests will decrease the amount of time that you wait before being seen by our physicians.       _____________________________________________________________  Should you have questions after your visit to [facility], please contact our office at [phone] between the hours of 8:30 a.m. and 4:30 p.m.  Voicemails left after 4:30 p.m. will not be returned until the following business day.  For prescription refill requests, have your pharmacy contact our office.

## 2015-04-19 NOTE — Assessment & Plan Note (Addendum)
Multiple myeloma, not currently active, S/P 2 autologous BM transplant at [facility] under the guidance of Dr. Marcell Anger (retired) with the second bone marrow transplant occuring in July 2013. Recent bone marrow demonstrates 5% plasma cells and he was seen by Dr. Norma Fredrickson who recommended starting Revlimid at a dose of 5 mg 21/28 days. His Revlimid dosing has been complicated by pruritic rash and Hagan has decided to take Revlimid every other day which he is tolerating well.  Will continue to try to increase the dose in the future.  Chart reviewed.  Labs in 1 week: CBC diff, CMET, LDH, ESR, CRP, B2M, MM panel.  Labs in 3 weeks: CBC.  Labs in 5 weeks: CBC diff, CMET, LDH, ESR, CRP, B2M, MM panel.  Labs in 7 weeks: CBC  Return in 7 weeks for follow-up.    We have referred the patient to Dr. Ace Gins (pain clinic) for consideration of pain management of neck.  He is resistant to changes in Revlimid dosing given patient reported tolerability issues.  He will continue the same dose.  Since our discussion at his last encounter regarding his behavior in the clinic, I have not heard of any issues from staff about [place].  I hope this continues.   Previously, I have discussed the patient's transfusion needs with his nephrologist, Dr. Lowanda Foster, who is agreeable to allow [place] to continue to transfuse when needed despite the patient's 3x/week hemodialysis.  An old supportive therapy plan for Aranesp is deleted today and its associated episode of care.  He has not received Aranesp since starting hemodialysis.  This aspect of his care is being managed by dialysis.

## 2015-04-19 NOTE — Progress Notes (Signed)
      No PCP Per Patient No address on file  Multiple myeloma, remission status unspecified (HCC) - Plan: CBC with Differential, Comprehensive metabolic panel, Lactate dehydrogenase, Sedimentation rate, C-reactive protein, Kappa/lambda light chains, Beta 2 microglobuline, serum, IgG, IgA, IgM, Immunofixation electrophoresis, Protein electrophoresis, serum, CBC with Differential  CURRENT THERAPY: Revlimid 5 mg every other day (patient refusing an increase).  INTERVAL HISTORY: [name] 69 y.o. male returns for followup of multiple myeloma, not active at this time, S/P 2 autologous BM transplant at [facility] under the guidance of Dr. Hurd (retired) with the second bone marrow transplant occuring in July 2013. Recent bone marrow demonstrates 5% plasma cells and he was seen by Dr. Rodriguez who recommended starting Revlimid at a dose of 5 mg 21/28 days. His Revlimid dosing has been complicated by pruritic rash and [name] has decided to take Revlimid every other day which he is tolerating well.  Will try to increase the dose in the future.    Multiple myeloma (HCC)   01/26/2011 Initial Diagnosis Multiple myeloma   09/10/2014 - 09/14/2014 Chemotherapy Revlimid 5 mg days 1-21 every 28 days   09/14/2014 Adverse Reaction Rash, Revlimid versus Doxycycline   09/21/2014 - 09/23/2014 Chemotherapy Revlimid 5 mg days 1-21 every 28 days- rechallenge.   09/24/2014 Adverse Reaction Rash.  Revlimid-induced   10/22/2014 -  Chemotherapy Revlimid 5 mg every other day.    I personally reviewed and went over laboratory results with the patient.  The results are noted within this dictation.  The other day, [name] requested a referral to pain clinic to see if he is a candidate for injections in his neck for pain control secondary to past surgery.  We have referred him to Dr. Bethea.  He is not interested in pain medications, however he is interested in injections if possible.  He just had cataract surgery last week or  so.  He is pleasantly surprised at how well he can see.  He is scheduled for the other eye in the future.  He denies any new complaints today.  He reports he feels well which is supported by her personality today.  Previously, I have discussed the patient's need for transfusions with Dr. Befekadu (nephrologist).  He has deferred to us the need for transfusion and he is not concerned, at this time, regarding the patient fluid load and dialysis.   Past Medical History  Diagnosis Date  . Hypertension   . Ruptured cervical disc   . Fall resulting in striking against other object     paralyzed  . Multiple myeloma   . 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 Multiple myeloma (HCC) 01/26/2011  . Unspecified vitamin D deficiency 03/06/2013  . Anemia of chronic disease 12/30/2013  . Constipation   . Dialysis patient (HCC) 2016  . Thrombocytopenia (HCC) 08/31/2014  . 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 Constipation     has Essential hypertension, benign; BRADYCARDIA; Multiple myeloma (HCC); Intravenous pamidronate causing adverse effect in therapeutic use; Unspecified vitamin D deficiency; Lymphedema of lower extremity; Anemia of chronic disease; Chronic renal disease, stage 5, glomerular filtration rate less than or equal to 15 mL/min/1.73 square meter (HCC); Pathologic fracture; Anemia in chronic kidney disease; Thrombocytopenia (HCC); Bleeding; Anemia; Weakness generalized; ESRD on dialysis (HCC); and Anemia, chronic renal failure on his problem list.       is allergic to pamidronate.  Current Outpatient Prescriptions on File Prior to Visit  Medication Sig Dispense Refill  . cinacalcet (SENSIPAR) 30 MG tablet Take 30 mg by mouth daily.    . docusate sodium (COLACE) 100 MG capsule Take 100 mg by mouth daily.     . lenalidomide (REVLIMID) 5 MG capsule  Take one tablet every other day, after dialysis 14 capsule 1  . Omega-3 Fatty Acids (FISH OIL) 1000 MG CAPS Take 2 capsules by mouth daily.    . ondansetron (ZOFRAN) 8 MG tablet Take 1 tablet (8 mg total) by mouth every 8 (eight) hours as needed for nausea or vomiting. 30 tablet 1  . oxyCODONE-acetaminophen (ROXICET) 5-325 MG tablet Take 1-2 tablets by mouth every 6 (six) hours as needed for moderate pain. 10 tablet 0  . pregabalin (LYRICA) 75 MG capsule Take 1 capsule (75 mg total) by mouth 2 (two) times daily. 60 capsule 1  . torsemide (DEMADEX) 20 MG tablet Take 40 mg by mouth daily.    . albuterol (PROVENTIL) (2.5 MG/3ML) 0.083% nebulizer solution Take 3 mLs (2.5 mg total) by nebulization every 6 (six) hours as needed for wheezing or shortness of breath. (Patient not taking: Reported on 04/08/2015) 20 vial 1  . calcium acetate (PHOSLO) 667 MG capsule Take 667 mg by mouth 3 (three) times daily with meals. Take 3 capsules with each meal and 1 capsule with each snack     No current facility-administered medications on file prior to visit.    Past Surgical History  Procedure Laterality Date  . Inguinal hernia repair Bilateral   . Anterior cervical decomp/discectomy fusion    . Bone marrow aspirate and biopsy wiith lumbar puncture    . Melanoma excision      rt. breast  . Cervical spine surgery    . Kyphoplasty Bilateral 01/30/2014    Procedure: Thoracic eleven Kyphoplasty;  Surgeon: Neelesh Nundkumar, MD;  Location: [facility];  Service: Neurosurgery;  Laterality: Bilateral;  Thoracic eleven Kyphoplasty  . Av fistula placement Right 04/30/2014    Procedure: BRACHIOCEPHALIC ARTERIOVENOUS (AV) FISTULA CREATION;  Surgeon: Brian L Chen, MD;  Location: [facility] OR;  Service: Vascular;  Laterality: Right;  . Portacath placement Left 02/18/2015    Procedure: INSERTION OF PORT A CATH RIGHT INTERNAL JUGULAR VEIN ;  Surgeon: Brian L Chen, MD;  Location: [facility] OR;  Service: Vascular;  Laterality: Left;  .  Cataract extraction w/phaco Right 04/15/2015    Procedure: CATARACT EXTRACTION PHACO AND INTRAOCULAR LENS PLACEMENT (IOC);  Surgeon: Kerry Hunt, MD;  Location: [facility];  Service: Ophthalmology;  Laterality: Right;  CDE:8.97    Denies any headaches, dizziness, double vision, fevers, chills, night sweats, nausea, vomiting, diarrhea, constipation, chest pain, heart palpitations, shortness of breath, blood in stool, black tarry stool, urinary pain, urinary burning, urinary frequency, hematuria.   PHYSICAL EXAMINATION  ECOG PERFORMANCE STATUS: 1 - Symptomatic but completely ambulatory  Filed Vitals:   04/19/15 1048  BP: 159/79  Pulse: 64  Temp: 97.9 F (36.6 C)  Resp: 18    GENERAL:alert, no distress, well nourished, well developed, comfortable, cooperative, obese and smiling, unaccompanied today, and joking around today. SKIN: skin color, texture, turgor are normal, no rashes or significant lesions HEAD: Normocephalic, No masses, lesions, tenderness or abnormalities EYES: normal, PERRLA, EOMI, Conjunctiva are pink and non-injected EARS: External ears normal OROPHARYNX:lips, buccal mucosa, and tongue normal and mucous membranes are moist  NECK: supple, trachea midline LYMPH:  not examined BREAST:not examined CARDIAC:   RRR without murmur, rub, or gallop. LUNGS: CTA B/L without wheezing, rales, or rhonchi. ABDOMEN: + BS x 4 quadrants.  Soft, nontender. BACK: Back symmetric, no curvature. EXTREMITIES:less then 2 second capillary refill, no joint deformities, effusion, or inflammation, no skin discoloration  NEURO: alert & oriented x 3 with fluent speech, no focal motor/sensory deficits, gait normal   LABORATORY DATA: CBC    Component Value Date/Time   WBC 2.3* 04/08/2015 0859   RBC 2.43* 04/08/2015 0859   HGB 10.2* 04/15/2015 1142   HCT 30.0* 04/15/2015 1142   PLT 24* 04/08/2015 0859   MCV 111.5* 04/08/2015 0859   MCH 36.2* 04/08/2015 0859   MCHC 32.5 04/08/2015 0859   RDW 21.2*  04/08/2015 0859   LYMPHSABS 1.4 04/08/2015 0859   MONOABS 0.1 04/08/2015 0859   EOSABS 0.1 04/08/2015 0859   BASOSABS 0.0 04/08/2015 0859      Chemistry      Component Value Date/Time   NA 139 04/15/2015 1142   K 5.0 04/15/2015 1142   CL 100* 03/29/2015 1215   CO2 28 03/29/2015 1215   BUN 38* 03/29/2015 1215   CREATININE 5.53* 03/29/2015 1215   CREATININE 43.09* 01/26/2012 1052      Component Value Date/Time   CALCIUM 9.0 03/29/2015 1215   ALKPHOS 35* 03/29/2015 1215   AST 21 03/29/2015 1215   ALT 17 03/29/2015 1215   BILITOT 0.7 03/29/2015 1215     Lab Results  Component Value Date   PROT 6.7 03/29/2015   ALBUMINELP 63.2 05/16/2014   A1GS 4.8 05/16/2014   A2GS 9.2 05/16/2014   BETS 5.8 05/16/2014   BETA2SER 4.1 05/16/2014   GAMS 12.9 05/16/2014   MSPIKE NOT DETECTED 05/16/2014   SPEI (NOTE) 05/16/2014   SPECOM (NOTE) 05/16/2014   IGGSERUM 899 03/29/2015   IGA 340 03/29/2015   IGMSERUM 87 03/29/2015   IMMELINT (NOTE) 05/16/2014   KPAFRELGTCHN 248.38* 03/29/2015   LAMBDASER 95.66* 03/29/2015   KAPLAMBRATIO 2.60* 03/29/2015    PENDING LABS:   RADIOGRAPHIC STUDIES:  No results found.   PATHOLOGY:    ASSESSMENT AND PLAN:  Multiple myeloma Multiple myeloma, not currently active, S/P 2 autologous BM transplant at [facility] under the guidance of Dr. Marcell Anger (retired) with the second bone marrow transplant occuring in July 2013. Recent bone marrow demonstrates 5% plasma cells and he was seen by Dr. Norma Fredrickson who recommended starting Revlimid at a dose of 5 mg 21/28 days. His Revlimid dosing has been complicated by pruritic rash and [name] has decided to take Revlimid every other day which he is tolerating well.  Will continue to try to increase the dose in the future.  Chart reviewed.  Labs in 1 week: CBC diff, CMET, LDH, ESR, CRP, B2M, MM panel.  Labs in 3 weeks: CBC.  Labs in 5 weeks: CBC diff, CMET, LDH, ESR, CRP, B2M, MM panel.  Labs in 7 weeks:  CBC  Return in 7 weeks for follow-up.    We have referred the patient to Dr. Ace Gins (pain clinic) for consideration of pain management of neck.  He is resistant to changes in Revlimid dosing given patient reported tolerability issues.  He will continue the same dose.  Since our discussion at his last encounter regarding his behavior in the clinic, I have not heard of any issues from staff about [name].  I hope this continues.   Previously, I have discussed the patient's transfusion needs with his nephrologist, Dr. Lowanda Foster, who is agreeable to allow [place] to continue  to transfuse when needed despite the patient's 3x/week hemodialysis.  An old supportive therapy plan for Aranesp is deleted today and its associated episode of care.  He has not received Aranesp since starting hemodialysis.  This aspect of his care is being managed by dialysis.   THERAPY PLAN:  Continue Revlimid every other day.  Will try to increase dose to 21/28 days in the future.  All questions were answered. The patient knows to call the clinic with any problems, questions or concerns. We can certainly see the patient much sooner if necessary.  Patient and plan discussed with Dr. Shannon Penland and she is in agreement with the aforementioned.   This note is electronically signed by: KEFALAS,THOMAS, PA-C 04/19/2015 11:30 AM 

## 2015-04-24 ENCOUNTER — Emergency Department (HOSPITAL_COMMUNITY): Payer: Medicare Other

## 2015-04-24 ENCOUNTER — Emergency Department (HOSPITAL_COMMUNITY)
Admission: EM | Admit: 2015-04-24 | Discharge: 2015-04-24 | Disposition: A | Discharge status: Home / Self Care | Payer: Medicare Other | Attending: Emergency Medicine | Admitting: Emergency Medicine

## 2015-04-24 ENCOUNTER — Encounter (HOSPITAL_COMMUNITY): Payer: Self-pay

## 2015-04-24 DIAGNOSIS — R52 Pain, unspecified: Secondary | ICD-10-CM | POA: Diagnosis not present

## 2015-04-24 DIAGNOSIS — N185 Chronic kidney disease, stage 5: Secondary | ICD-10-CM | POA: Diagnosis not present

## 2015-04-24 DIAGNOSIS — R05 Cough: Secondary | ICD-10-CM | POA: Diagnosis present

## 2015-04-24 DIAGNOSIS — Z862 Personal history of diseases of the blood and blood-forming organs and certain disorders involving the immune mechanism: Secondary | ICD-10-CM | POA: Insufficient documentation

## 2015-04-24 DIAGNOSIS — J069 Acute upper respiratory infection, unspecified: Secondary | ICD-10-CM | POA: Diagnosis not present

## 2015-04-24 DIAGNOSIS — Z8639 Personal history of other endocrine, nutritional and metabolic disease: Secondary | ICD-10-CM | POA: Insufficient documentation

## 2015-04-24 DIAGNOSIS — Z79899 Other long term (current) drug therapy: Secondary | ICD-10-CM | POA: Insufficient documentation

## 2015-04-24 DIAGNOSIS — Z8579 Personal history of other malignant neoplasms of lymphoid, hematopoietic and related tissues: Secondary | ICD-10-CM | POA: Insufficient documentation

## 2015-04-24 DIAGNOSIS — K5900 Constipation, unspecified: Secondary | ICD-10-CM | POA: Diagnosis not present

## 2015-04-24 DIAGNOSIS — Z992 Dependence on renal dialysis: Secondary | ICD-10-CM | POA: Diagnosis not present

## 2015-04-24 DIAGNOSIS — I120 Hypertensive chronic kidney disease with stage 5 chronic kidney disease or end stage renal disease: Secondary | ICD-10-CM | POA: Diagnosis not present

## 2015-04-24 MED ORDER — PREDNISONE 50 MG PO TABS
60.0000 mg | ORAL_TABLET | Freq: Once | ORAL | Status: AC
  Administered 2015-04-24: 60 mg via ORAL
  Filled 2015-04-24: qty 1

## 2015-04-24 MED ORDER — ALBUTEROL SULFATE HFA 108 (90 BASE) MCG/ACT IN AERS
2.0000 | INHALATION_SPRAY | RESPIRATORY_TRACT | Status: DC | PRN
  Administered 2015-04-24: 2 via RESPIRATORY_TRACT
  Filled 2015-04-24: qty 6.7

## 2015-04-24 MED ORDER — PREDNISONE 20 MG PO TABS: 40.0000 mg | ORAL_TABLET | Freq: Every day | ORAL | Status: DC

## 2015-04-24 NOTE — ED Notes (Signed)
Patient refused to change into gown. States he is not here to get blood work drawn he just has a cold. Pt states "when is the doctor coming in, that's who I want to see."

## 2015-04-24 NOTE — Discharge Instructions (Signed)
Upper Respiratory Infection, Adult Most upper respiratory infections (URIs) are a viral infection of the air passages leading to the lungs. A URI affects the nose, throat, and upper air passages. The most common type of URI is nasopharyngitis and is typically referred to as "the common cold." URIs run their course and usually go away on their own. Most of the time, a URI does not require medical attention, but sometimes a bacterial infection in the upper airways can follow a viral infection. This is called a secondary infection. Sinus and middle ear infections are common types of secondary upper respiratory infections. Bacterial pneumonia can also complicate a URI. A URI can worsen asthma and chronic obstructive pulmonary disease (COPD). Sometimes, these complications can require emergency medical care and may be life threatening.  CAUSES Almost all URIs are caused by viruses. A virus is a type of germ and can spread from one person to another.  RISKS FACTORS You may be at risk for a URI if:   You smoke.   You have chronic heart or lung disease.  You have a weakened defense (immune) system.   You are very young or very old.   You have nasal allergies or asthma.  You work in crowded or poorly ventilated areas.  You work in health care facilities or schools. SIGNS AND SYMPTOMS  Symptoms typically develop 2-3 days after you come in contact with a cold virus. Most viral URIs last 7-10 days. However, viral URIs from the influenza virus (flu virus) can last 14-18 days and are typically more severe. Symptoms may include:   Runny or stuffy (congested) nose.   Sneezing.   Cough.   Sore throat.   Headache.   Fatigue.   Fever.   Loss of appetite.   Pain in your forehead, behind your eyes, and over your cheekbones (sinus pain).  Muscle aches.  DIAGNOSIS  Your health care provider may diagnose a URI by:  Physical exam.  Tests to check that your symptoms are not due to  another condition such as:  Strep throat.  Sinusitis.  Pneumonia.  Asthma. TREATMENT  A URI goes away on its own with time. It cannot be cured with medicines, but medicines may be prescribed or recommended to relieve symptoms. Medicines may help:  Reduce your fever.  Reduce your cough.  Relieve nasal congestion. HOME CARE INSTRUCTIONS   Take medicines only as directed by your health care provider.   Gargle warm saltwater or take cough drops to comfort your throat as directed by your health care provider.  Use a warm mist humidifier or inhale steam from a shower to increase air moisture. This may make it easier to breathe.  Drink enough fluid to keep your urine clear or pale yellow.   Eat soups and other clear broths and maintain good nutrition.   Rest as needed.   Return to work when your temperature has returned to normal or as your health care provider advises. You may need to stay home longer to avoid infecting others. You can also use a face mask and careful hand washing to prevent spread of the virus.  Increase the usage of your inhaler if you have asthma.   Do not use any tobacco products, including cigarettes, chewing tobacco, or electronic cigarettes. If you need help quitting, ask your health care provider. PREVENTION  The best way to protect yourself from getting a cold is to practice good hygiene.   Avoid oral or hand contact with people with cold   symptoms.   Wash your hands often if contact occurs.  There is no clear evidence that vitamin C, vitamin E, echinacea, or exercise reduces the chance of developing a cold. However, it is always recommended to get plenty of rest, exercise, and practice good nutrition.  SEEK MEDICAL CARE IF:   You are getting worse rather than better.   Your symptoms are not controlled by medicine.   You have chills.  You have worsening shortness of breath.  You have brown or red mucus.  You have yellow or brown nasal  discharge.  You have pain in your face, especially when you bend forward.  You have a fever.  You have swollen neck glands.  You have pain while swallowing.  You have white areas in the back of your throat. SEEK IMMEDIATE MEDICAL CARE IF:   You have severe or persistent:  Headache.  Ear pain.  Sinus pain.  Chest pain.  You have chronic lung disease and any of the following:  Wheezing.  Prolonged cough.  Coughing up blood.  A change in your usual mucus.  You have a stiff neck.  You have changes in your:  Vision.  Hearing.  Thinking.  Mood. MAKE SURE YOU:   Understand these instructions.  Will watch your condition.  Will get help right away if you are not doing well or get worse.   This information is not intended to replace advice given to you by your health care provider. Make sure you discuss any questions you have with your health care provider.   Document Released: 10/28/2000 Document Revised: 09/18/2014 Document Reviewed: 08/09/2013 Elsevier Interactive Patient Education 2016 [employer]  

## 2015-04-24 NOTE — ED Provider Notes (Signed)
CSN: [id number]     Arrival date & time 04/24/15  1031 History  By signing my name below, I, [name], attest that this documentation has been prepared under the direction and in the presence of No att. providers found. Electronically Signed: Erling [name], ED Scribe. 04/24/2015. 3:38 PM.    Chief Complaint  Patient presents with  . Cough   The history is provided by the patient. No language interpreter was used.    HPI Comments: [name] is a 69 y.o. male with a h/o HTN who presents to the Emergency Department complaining of gradual onset, intermittent, cough productive of yellow sputum that began yesterday. He reports associated rhinorrhea and congestion. Pt notes he has generalized body aches at baseline. He denies any aggravating/alleviating factors. He denies any sick contacts. Pt is a non smoker. Pt notes he has a nebulizer treatment at home along with an inhaler but only uses it at needed and notes he has not had to use it in a while. He is on dialysis 3x a week. He denies any sore throat, fever, or other associated symptoms.   Past Medical History  Diagnosis Date  . Hypertension   . Ruptured cervical disc   . Fall resulting in striking against other object     paralyzed  . Multiple myeloma   . Recurrent multiple myeloma of bone marrow with unknown EBV status ([place])   . Multiple myeloma ([place]) 01/26/2011  . Unspecified vitamin D deficiency 03/06/2013  . Anemia of chronic disease 12/30/2013  . Constipation   . Dialysis patient ([place]) 2016  . Thrombocytopenia ([place]) 08/31/2014  . 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 Constipation    Past Surgical History  Procedure Laterality Date  . Inguinal hernia repair Bilateral   . Anterior cervical decomp/discectomy fusion    . Bone marrow aspirate and biopsy wiith lumbar puncture    . Melanoma excision      rt.  breast  . Cervical spine surgery    . Kyphoplasty Bilateral 01/30/2014    Procedure: Thoracic eleven Kyphoplasty;  Surgeon: Consuella Lose, MD;  Location: [facility];  Service: Neurosurgery;  Laterality: Bilateral;  Thoracic eleven Kyphoplasty  . Av fistula placement Right 04/30/2014    Procedure: BRACHIOCEPHALIC ARTERIOVENOUS (AV) FISTULA CREATION;  Surgeon: Conrad Xenia, MD;  Location: [place];  Service: Vascular;  Laterality: Right;  . Portacath placement Left 02/18/2015    Procedure: INSERTION OF PORT A CATH RIGHT INTERNAL JUGULAR VEIN ;  Surgeon: Conrad Buffalo, MD;  Location: [place];  Service: Vascular;  Laterality: Left;  . Cataract extraction w/phaco Right 04/15/2015    Procedure: CATARACT EXTRACTION PHACO AND INTRAOCULAR LENS PLACEMENT (IOC);  Surgeon: Tonny Branch, MD;  Location: [facility];  Service: Ophthalmology;  Laterality: Right;  CDE:8.97   Family History  Problem Relation Age of Onset  . Cancer Sister   . Cancer Brother    Social History  Substance Use Topics  . Smoking status: Never Smoker   . Smokeless tobacco: Never Used  . Alcohol Use: No    Review of Systems  Constitutional: Negative for fever.  HENT: Positive for congestion and rhinorrhea. Negative for sore throat.   Respiratory: Positive for cough.       Allergies  Pamidronate  Home Medications   Prior to Admission medications   Medication Sig Start Date End Date Taking? Authorizing  Provider  albuterol (PROVENTIL) (2.5 MG/3ML) 0.083% nebulizer solution Take 3 mLs (2.5 mg total) by nebulization every 6 (six) hours as needed for wheezing or shortness of breath. Patient not taking: Reported on 04/08/2015 09/28/14   Baird Cancer, PA-C  calcium acetate (PHOSLO) 667 MG capsule Take 667 mg by mouth 3 (three) times daily with meals. Take 3 capsules with each meal and 1 capsule with each snack    Historical Provider, MD  cinacalcet (SENSIPAR) 30 MG tablet Take 30 mg by mouth daily.    Historical Provider, MD   docusate sodium (COLACE) 100 MG capsule Take 100 mg by mouth daily.     Historical Provider, MD  DUREZOL 0.05 % EMUL Apply 1 drop to eye 3 (three) times daily. 04/22/15   Historical Provider, MD  HYDROcodone-acetaminophen (NORCO/VICODIN) 5-325 MG tablet Take 1 tablet by mouth every 6 (six) hours as needed for severe pain.  04/11/15   Historical Provider, MD  lenalidomide (REVLIMID) 5 MG capsule Take one tablet every other day, after dialysis 03/06/15   Baird Cancer, PA-C  Omega-3 Fatty Acids (FISH OIL) 1000 MG CAPS Take 2 capsules by mouth daily.    Historical Provider, MD  ondansetron (ZOFRAN) 8 MG tablet Take 1 tablet (8 mg total) by mouth every 8 (eight) hours as needed for nausea or vomiting. 03/15/15   Baird Cancer, PA-C  oxyCODONE-acetaminophen (ROXICET) 5-325 MG tablet Take 1-2 tablets by mouth every 6 (six) hours as needed for moderate pain. Patient not taking: Reported on 04/24/2015 02/18/15   Conrad , MD  predniSONE (DELTASONE) 20 MG tablet Take 2 tablets (40 mg total) by mouth daily. 04/25/15   Davonna Belling, MD  pregabalin (LYRICA) 75 MG capsule Take 1 capsule (75 mg total) by mouth 2 (two) times daily. 04/10/15   Manon Hilding Kefalas, PA-C  PROLENSA 0.07 % SOLN Apply 1 drop to eye daily. 04/02/15   Historical Provider, MD  torsemide (DEMADEX) 20 MG tablet Take 40 mg by mouth daily.    Historical Provider, MD   Triage Vitals: BP 133/80 mmHg  Pulse 68  Temp(Src) 98.1 F (36.7 C) (Oral)  Resp 18  Ht _0  (1.803 m)  Wt 240 lb (108.863 kg)  BMI 33.49 kg/m2  SpO2 100%  Physical Exam  Constitutional: He is oriented to person, place, and time. He appears well-developed and well-nourished. No distress.  HENT:  Head: Normocephalic and atraumatic.  Mouth/Throat: Oropharynx is clear and moist.  Eyes: Conjunctivae and EOM are normal.  Neck: Neck supple. No tracheal deviation present.  Cardiovascular: Normal rate, regular rhythm and normal heart sounds.   Pulmonary/Chest:  Effort normal. No respiratory distress. He has wheezes (mild expiatory). He has no rhonchi. He has no rales.  Musculoskeletal: Normal range of motion.  Neurological: He is alert and oriented to person, place, and time.  Skin: Skin is warm and dry.  Psychiatric: He has a normal mood and affect. His behavior is normal.  Nursing note and vitals reviewed.   ED Course  Procedures (including critical care time)  COORDINATION OF CARE: 10:40 AM-Will order CXR.  Pt advised of plan for treatment and pt agrees.  Labs Review Labs Reviewed - No data to display  Imaging Review Dg Chest 2 View  04/24/2015  CLINICAL DATA:  Cough.  Multiple myeloma.  Dialysis patient EXAM: CHEST  2 VIEW COMPARISON:  02/23/2015 FINDINGS: Cardiac enlargement without heart failure. Lungs are clear without infiltrate or effusion. Cement in the T11 vertebral body.  No acute fracture Stent in the right subclavian vein. Right jugular Port-A-Cath tip in the SVC. IMPRESSION: No active cardiopulmonary disease. Electronically Signed   By: Franchot Gallo M.D.   On: 04/24/2015 11:01   I have personally reviewed and evaluated these images and lab results as part of my medical decision-making.   EKG Interpretation None      MDM   Final diagnoses:  URI (upper respiratory infection)     patient with cough. Some scattered wheezing. No rest for distress. Will treat with steroids and give inhaler. Will discharge home. negative xray I personally performed the services described in this documentation, which was scribed in my presence. The recorded information has been reviewed and is accurate.       Davonna Belling, MD 04/24/15 1539

## 2015-04-24 NOTE — ED Notes (Signed)
Pt reports cough since yesterday and chronic generalized pain.

## 2015-04-25 ENCOUNTER — Encounter (HOSPITAL_COMMUNITY)
Admission: RE | Admit: 2015-04-25 | Discharge: 2015-04-25 | Disposition: A | Discharge status: Home / Self Care | Payer: Medicare Other | Source: Ambulatory Visit | Attending: Ophthalmology | Admitting: Ophthalmology

## 2015-04-26 ENCOUNTER — Encounter (HOSPITAL_BASED_OUTPATIENT_CLINIC_OR_DEPARTMENT_OTHER): Payer: Medicare Other

## 2015-04-26 VITALS — BP 132/66 | HR 70 | Temp 98.1°F | Resp 18

## 2015-04-26 DIAGNOSIS — D696 Thrombocytopenia, unspecified: Secondary | ICD-10-CM

## 2015-04-26 DIAGNOSIS — C9000 Multiple myeloma not having achieved remission: Secondary | ICD-10-CM | POA: Diagnosis present

## 2015-04-26 LAB — CBC WITH DIFFERENTIAL/PLATELET
Basophils Absolute: 0 10*3/uL (ref 0.0–0.1)
Basophils Relative: 1 %
EOS PCT: 5 %
Eosinophils Absolute: 0.1 10*3/uL (ref 0.0–0.7)
HEMATOCRIT: 26.9 % — AB (ref 39.0–52.0)
Hemoglobin: 8.9 g/dL — ABNORMAL LOW (ref 13.0–17.0)
LYMPHS ABS: 0.8 10*3/uL (ref 0.7–4.0)
LYMPHS PCT: 47 %
MCH: 36.6 pg — AB (ref 26.0–34.0)
MCHC: 33.1 g/dL (ref 30.0–36.0)
MCV: 110.7 fL — AB (ref 78.0–100.0)
Monocytes Absolute: 0.2 10*3/uL (ref 0.1–1.0)
Monocytes Relative: 12 %
NEUTROS ABS: 0.6 10*3/uL — AB (ref 1.7–7.7)
Neutrophils Relative %: 35 %
PLATELETS: 20 10*3/uL — AB (ref 150–400)
RBC: 2.43 MIL/uL — AB (ref 4.22–5.81)
RDW: 22.2 % — ABNORMAL HIGH (ref 11.5–15.5)
WBC: 1.7 10*3/uL — AB (ref 4.0–10.5)

## 2015-04-26 MED ORDER — HEPARIN SOD (PORK) LOCK FLUSH 100 UNIT/ML IV SOLN
500.0000 [IU] | Freq: Every day | INTRAVENOUS | Status: AC | PRN
  Administered 2015-04-26: 500 [IU]

## 2015-04-26 MED ORDER — HEPARIN SOD (PORK) LOCK FLUSH 100 UNIT/ML IV SOLN
INTRAVENOUS | Status: AC
  Filled 2015-04-26: qty 5

## 2015-04-26 MED ORDER — DIPHENHYDRAMINE HCL 25 MG PO CAPS
25.0000 mg | ORAL_CAPSULE | Freq: Once | ORAL | Status: AC
  Administered 2015-04-26: 25 mg via ORAL

## 2015-04-26 MED ORDER — ACETAMINOPHEN 325 MG PO TABS
ORAL_TABLET | ORAL | Status: AC
  Filled 2015-04-26: qty 2

## 2015-04-26 MED ORDER — ACETAMINOPHEN 325 MG PO TABS
650.0000 mg | ORAL_TABLET | Freq: Once | ORAL | Status: AC
  Administered 2015-04-26: 650 mg via ORAL

## 2015-04-26 MED ORDER — SODIUM CHLORIDE 0.9 % IJ SOLN
10.0000 mL | INTRAMUSCULAR | Status: AC | PRN
  Administered 2015-04-26: 10 mL

## 2015-04-26 MED ORDER — DIPHENHYDRAMINE HCL 25 MG PO CAPS
ORAL_CAPSULE | ORAL | Status: AC
  Filled 2015-04-26: qty 1

## 2015-04-26 MED ORDER — SODIUM CHLORIDE 0.9 % IV SOLN
250.0000 mL | Freq: Once | INTRAVENOUS | Status: AC
  Administered 2015-04-26: 250 mL via INTRAVENOUS

## 2015-04-26 NOTE — Progress Notes (Signed)
Tolerated platelet transfusion well.  Ambulatory on discharge to self/home.

## 2015-04-26 NOTE — Progress Notes (Signed)
LABS DRAWN

## 2015-04-26 NOTE — Patient Instructions (Signed)
[place] at [facility] Discharge Instructions  RECOMMENDATIONS MADE BY THE CONSULTANT AND ANY TEST RESULTS WILL BE SENT TO YOUR REFERRING PHYSICIAN.  Platelet infusion today as ordered. Return as scheduled.  Thank you for choosing [place] [facility] at [facility] to provide your oncology and hematology care.  To afford each patient quality time with our provider, please arrive at least 15 minutes before your scheduled appointment time.    You need to re-schedule your appointment should you arrive 10 or more minutes late.  We strive to give you quality time with our providers, and arriving late affects you and other patients whose appointments are after yours.  Also, if you no show three or more times for appointments you may be dismissed from the clinic at the providers discretion.     Again, thank you for choosing [facility].  Our hope is that these requests will decrease the amount of time that you wait before being seen by our physicians.       _____________________________________________________________  Should you have questions after your visit to [facility], please contact our office at [phone] between the hours of 8:30 a.m. and 4:30 p.m.  Voicemails left after 4:30 p.m. will not be returned until the following business day.  For prescription refill requests, have your pharmacy contact our office.

## 2015-04-26 NOTE — Addendum Note (Signed)
Addended by: Gerhard Perches on: 04/26/2015 10:47 AM   Modules accepted: Orders, SmartSet

## 2015-04-26 NOTE — Progress Notes (Addendum)
CRITICAL VALUE ALERT Critical value received:  Platelets 20,000 Date of notification: 04/26/15 Time of notification: [id number] Critical value read back:  Yes.   Nurse who received alert:  Isidoro Donning RN Tom notified of platelets being 20,000 and he said that he did not need platelets and for him to return on Wed of next week for stat CBC and for him to wait for results. Hildred Alamin to notify pt of results.

## 2015-04-26 NOTE — Progress Notes (Signed)
Pt actually will be getting 1 unit of platelets since he is having eye surgery on Monday. Lab notified and order placed.

## 2015-04-27 LAB — PREPARE PLATELET PHERESIS: Unit division: 0

## 2015-04-29 ENCOUNTER — Emergency Department (HOSPITAL_COMMUNITY)
Admission: EM | Admit: 2015-04-29 | Discharge: 2015-04-29 | Disposition: A | Discharge status: Home / Self Care | Payer: Medicare Other | Attending: Emergency Medicine | Admitting: Emergency Medicine

## 2015-04-29 ENCOUNTER — Encounter (HOSPITAL_COMMUNITY)
Admission: RE | Disposition: A | Discharge status: Home / Self Care | Payer: Self-pay | Source: Ambulatory Visit | Attending: Ophthalmology

## 2015-04-29 ENCOUNTER — Ambulatory Visit (HOSPITAL_COMMUNITY)
Admission: RE | Admit: 2015-04-29 | Discharge: 2015-04-29 | Disposition: A | Discharge status: Home / Self Care | Payer: Medicare Other | Source: Ambulatory Visit | Attending: Ophthalmology | Admitting: Ophthalmology

## 2015-04-29 ENCOUNTER — Ambulatory Visit (HOSPITAL_COMMUNITY): Payer: Medicare Other | Admitting: Anesthesiology

## 2015-04-29 ENCOUNTER — Encounter (HOSPITAL_COMMUNITY): Payer: Self-pay | Admitting: *Deleted

## 2015-04-29 ENCOUNTER — Encounter (HOSPITAL_COMMUNITY): Payer: Self-pay | Admitting: Emergency Medicine

## 2015-04-29 DIAGNOSIS — H25812 Combined forms of age-related cataract, left eye: Secondary | ICD-10-CM | POA: Insufficient documentation

## 2015-04-29 DIAGNOSIS — N186 End stage renal disease: Secondary | ICD-10-CM | POA: Diagnosis not present

## 2015-04-29 DIAGNOSIS — R0981 Nasal congestion: Secondary | ICD-10-CM | POA: Diagnosis present

## 2015-04-29 DIAGNOSIS — Z7952 Long term (current) use of systemic steroids: Secondary | ICD-10-CM | POA: Insufficient documentation

## 2015-04-29 DIAGNOSIS — K5900 Constipation, unspecified: Secondary | ICD-10-CM | POA: Insufficient documentation

## 2015-04-29 DIAGNOSIS — Z8579 Personal history of other malignant neoplasms of lymphoid, hematopoietic and related tissues: Secondary | ICD-10-CM | POA: Insufficient documentation

## 2015-04-29 DIAGNOSIS — Z992 Dependence on renal dialysis: Secondary | ICD-10-CM | POA: Insufficient documentation

## 2015-04-29 DIAGNOSIS — I120 Hypertensive chronic kidney disease with stage 5 chronic kidney disease or end stage renal disease: Secondary | ICD-10-CM | POA: Diagnosis not present

## 2015-04-29 DIAGNOSIS — Z8639 Personal history of other endocrine, nutritional and metabolic disease: Secondary | ICD-10-CM | POA: Insufficient documentation

## 2015-04-29 DIAGNOSIS — Z862 Personal history of diseases of the blood and blood-forming organs and certain disorders involving the immune mechanism: Secondary | ICD-10-CM | POA: Insufficient documentation

## 2015-04-29 DIAGNOSIS — C9001 Multiple myeloma in remission: Secondary | ICD-10-CM | POA: Insufficient documentation

## 2015-04-29 DIAGNOSIS — J40 Bronchitis, not specified as acute or chronic: Secondary | ICD-10-CM

## 2015-04-29 DIAGNOSIS — Z79899 Other long term (current) drug therapy: Secondary | ICD-10-CM | POA: Insufficient documentation

## 2015-04-29 HISTORY — PX: CATARACT EXTRACTION W/PHACO: SHX586

## 2015-04-29 SURGERY — PHACOEMULSIFICATION, CATARACT, WITH IOL INSERTION
Anesthesia: Monitor Anesthesia Care | Site: Eye | Laterality: Left

## 2015-04-29 MED ORDER — MIDAZOLAM HCL 2 MG/2ML IJ SOLN
1.0000 mg | INTRAMUSCULAR | Status: DC | PRN
  Administered 2015-04-29: 2 mg via INTRAVENOUS

## 2015-04-29 MED ORDER — PROVISC 10 MG/ML IO SOLN
INTRAOCULAR | Status: DC | PRN
  Administered 2015-04-29: 0.85 mL via INTRAOCULAR

## 2015-04-29 MED ORDER — BSS IO SOLN
INTRAOCULAR | Status: DC | PRN
  Administered 2015-04-29: 15 mL via INTRAOCULAR

## 2015-04-29 MED ORDER — EPINEPHRINE HCL 1 MG/ML IJ SOLN
INTRAOCULAR | Status: DC | PRN
  Administered 2015-04-29: 500 mL

## 2015-04-29 MED ORDER — PHENYLEPHRINE HCL 2.5 % OP SOLN
1.0000 [drp] | OPHTHALMIC | Status: AC
  Administered 2015-04-29 (×3): 1 [drp] via OPHTHALMIC

## 2015-04-29 MED ORDER — MIDAZOLAM HCL 2 MG/2ML IJ SOLN
INTRAMUSCULAR | Status: AC
  Filled 2015-04-29: qty 2

## 2015-04-29 MED ORDER — LIDOCAINE HCL (PF) 1 % IJ SOLN
INTRAMUSCULAR | Status: DC | PRN
  Administered 2015-04-29: .6 mL

## 2015-04-29 MED ORDER — NEOMYCIN-POLYMYXIN-DEXAMETH 3.5-10000-0.1 OP SUSP
OPHTHALMIC | Status: DC | PRN
  Administered 2015-04-29: 1 [drp] via OPHTHALMIC

## 2015-04-29 MED ORDER — LACTATED RINGERS IV SOLN: INTRAVENOUS | Status: DC

## 2015-04-29 MED ORDER — LIDOCAINE HCL 3.5 % OP GEL
1.0000 "application " | Freq: Once | OPHTHALMIC | Status: AC
  Administered 2015-04-29: 1 via OPHTHALMIC

## 2015-04-29 MED ORDER — SODIUM CHLORIDE 0.9 % IV SOLN
INTRAVENOUS | Status: DC | PRN
  Administered 2015-04-29: 12:00:00 via INTRAVENOUS

## 2015-04-29 MED ORDER — SODIUM CHLORIDE 0.9 % IV SOLN
Freq: Once | INTRAVENOUS | Status: AC
  Administered 2015-04-29: 500 mL via INTRAVENOUS

## 2015-04-29 MED ORDER — FENTANYL CITRATE (PF) 100 MCG/2ML IJ SOLN
INTRAMUSCULAR | Status: AC
  Filled 2015-04-29: qty 2

## 2015-04-29 MED ORDER — POVIDONE-IODINE 5 % OP SOLN
OPHTHALMIC | Status: DC | PRN
  Administered 2015-04-29: 1 via OPHTHALMIC

## 2015-04-29 MED ORDER — CYCLOPENTOLATE-PHENYLEPHRINE 0.2-1 % OP SOLN
1.0000 [drp] | OPHTHALMIC | Status: AC
  Administered 2015-04-29 (×3): 1 [drp] via OPHTHALMIC

## 2015-04-29 MED ORDER — HYDROCOD POLST-CPM POLST ER 10-8 MG/5ML PO SUER: 5.0000 mL | Freq: Two times a day (BID) | ORAL | Status: DC | PRN

## 2015-04-29 MED ORDER — FENTANYL CITRATE (PF) 100 MCG/2ML IJ SOLN
25.0000 ug | INTRAMUSCULAR | Status: AC
  Administered 2015-04-29 (×2): 25 ug via INTRAVENOUS

## 2015-04-29 MED ORDER — EPINEPHRINE HCL 1 MG/ML IJ SOLN
INTRAMUSCULAR | Status: AC
  Filled 2015-04-29: qty 1

## 2015-04-29 MED ORDER — AZITHROMYCIN 250 MG PO TABS: 250.0000 mg | ORAL_TABLET | Freq: Every day | ORAL | Status: DC

## 2015-04-29 MED ORDER — TETRACAINE HCL 0.5 % OP SOLN
1.0000 [drp] | OPHTHALMIC | Status: AC
  Administered 2015-04-29 (×3): 1 [drp] via OPHTHALMIC

## 2015-04-29 SURGICAL SUPPLY — 35 items
CAPSULAR TENSION RING-AMO (OPHTHALMIC RELATED) IMPLANT
CLOTH BEACON ORANGE TIMEOUT ST (SAFETY) ×2 IMPLANT
EYE SHIELD UNIVERSAL CLEAR (GAUZE/BANDAGES/DRESSINGS) ×2 IMPLANT
GLOVE BIO SURGEON STRL SZ 6.5 (GLOVE) IMPLANT
GLOVE BIO SURGEONS STRL SZ 6.5 (GLOVE)
GLOVE BIOGEL PI IND STRL 6.5 (GLOVE) IMPLANT
GLOVE BIOGEL PI IND STRL 7.0 (GLOVE) IMPLANT
GLOVE BIOGEL PI IND STRL 7.5 (GLOVE) IMPLANT
GLOVE BIOGEL PI INDICATOR 6.5 (GLOVE)
GLOVE BIOGEL PI INDICATOR 7.0 (GLOVE) ×4
GLOVE BIOGEL PI INDICATOR 7.5 (GLOVE)
GLOVE ECLIPSE 6.5 STRL STRAW (GLOVE) IMPLANT
GLOVE ECLIPSE 7.0 STRL STRAW (GLOVE) IMPLANT
GLOVE ECLIPSE 7.5 STRL STRAW (GLOVE) IMPLANT
GLOVE EXAM NITRILE LRG STRL (GLOVE) IMPLANT
GLOVE EXAM NITRILE MD LF STRL (GLOVE) IMPLANT
GLOVE SKINSENSE NS SZ6.5 (GLOVE)
GLOVE SKINSENSE NS SZ7.0 (GLOVE)
GLOVE SKINSENSE STRL SZ6.5 (GLOVE) IMPLANT
GLOVE SKINSENSE STRL SZ7.0 (GLOVE) IMPLANT
KIT VITRECTOMY (OPHTHALMIC RELATED) IMPLANT
PAD ARMBOARD 7.5X6 YLW CONV (MISCELLANEOUS) ×2 IMPLANT
PROC W NO LENS (INTRAOCULAR LENS)
PROC W SPEC LENS (INTRAOCULAR LENS)
PROCESS W NO LENS (INTRAOCULAR LENS) IMPLANT
PROCESS W SPEC LENS (INTRAOCULAR LENS) IMPLANT
RETRACTOR IRIS SIGHTPATH (OPHTHALMIC RELATED) IMPLANT
RING MALYGIN (MISCELLANEOUS) IMPLANT
SIGHTPATH CAT PROC W REG LENS (Ophthalmic Related) ×3 IMPLANT
SYRINGE LUER LOK 1CC (MISCELLANEOUS) ×2 IMPLANT
TAPE SURG TRANSPORE 1 IN (GAUZE/BANDAGES/DRESSINGS) IMPLANT
TAPE SURGICAL TRANSPORE 1 IN (GAUZE/BANDAGES/DRESSINGS) ×2
TOWEL OR 17X26 4PK STRL BLUE (TOWEL DISPOSABLE) ×2 IMPLANT
VISCOELASTIC ADDITIONAL (OPHTHALMIC RELATED) IMPLANT
WATER STERILE IRR 250ML POUR (IV SOLUTION) ×2 IMPLANT

## 2015-04-29 NOTE — Discharge Instructions (Signed)

## 2015-04-29 NOTE — Anesthesia Preprocedure Evaluation (Signed)
Anesthesia Evaluation  Patient identified by MRN, date of birth, ID band Patient awake    Reviewed: Allergy & Precautions, NPO status , Patient's Chart, lab work & pertinent test results  Airway Mallampati: II  TM Distance: >3 FB     Dental  (+) Partial Lower, Partial Upper   Pulmonary neg pulmonary ROS,    breath sounds clear to auscultation       Cardiovascular hypertension,  Rhythm:Regular Rate:Normal     Neuro/Psych    GI/Hepatic   Endo/Other    Renal/GU ESRF and DialysisRenal disease     Musculoskeletal   Abdominal   Peds  Hematology   Anesthesia Other Findings Multiple myeloma in remission   Reproductive/Obstetrics                             Anesthesia Physical Anesthesia Plan  ASA: III  Anesthesia Plan: MAC   Post-op Pain Management:    Induction: Intravenous  Airway Management Planned: Nasal Cannula  Additional Equipment:   Intra-op Plan:   Post-operative Plan:   Informed Consent: I have reviewed the patients History and Physical, chart, labs and discussed the procedure including the risks, benefits and alternatives for the proposed anesthesia with the patient or authorized representative who has indicated his/her understanding and acceptance.     Plan Discussed with:   Anesthesia Plan Comments:         Anesthesia Quick Evaluation

## 2015-04-29 NOTE — Anesthesia Postprocedure Evaluation (Signed)
Anesthesia Post Note  Patient: [name]  Procedure(s) Performed: Procedure(s) (LRB): CATARACT EXTRACTION PHACO AND INTRAOCULAR LENS PLACEMENT ; CDE:  7.61 (Left)  Patient location during evaluation: Short Stay Anesthesia Type: MAC Level of consciousness: awake and alert and patient cooperative Pain management: pain level controlled Vital Signs Assessment: post-procedure vital signs reviewed and stable Respiratory status: spontaneous breathing Cardiovascular status: blood pressure returned to baseline Postop Assessment: adequate PO intake and no signs of nausea or vomiting Anesthetic complications: no    Last Vitals:  Filed Vitals:   04/29/15 1210 04/29/15 1215  BP: 162/99 163/93  Pulse:    Temp:    Resp: 27 22    Last Pain:  Filed Vitals:   04/29/15 1218  PainSc: 4                  Kaziah Krizek J

## 2015-04-29 NOTE — Transfer of Care (Signed)
Immediate Anesthesia Transfer of Care Note  Patient: [name]  Procedure(s) Performed: Procedure(s): CATARACT EXTRACTION PHACO AND INTRAOCULAR LENS PLACEMENT ; CDE:  7.61 (Left)  Patient Location: Short Stay  Anesthesia Type:MAC  Level of Consciousness: awake, alert , oriented and patient cooperative  Airway & Oxygen Therapy: Patient Spontanous Breathing  Post-op Assessment: Report given to RN, Post -op Vital signs reviewed and stable and Patient moving all extremities  Post vital signs: Reviewed and stable  Last Vitals:  Filed Vitals:   04/29/15 1210 04/29/15 1215  BP: 162/99 163/93  Pulse:    Temp:    Resp: 27 22    Complications: No apparent anesthesia complications

## 2015-04-29 NOTE — ED Provider Notes (Signed)
CSN: [id number]     Arrival date & time 04/29/15  1353 History  By signing my name below, I, Rayna Sexton, attest that this documentation has been prepared under the direction and in the presence of Dimitriy Carreras, PA-C. Electronically Signed: Rayna Sexton, ED Scribe. 04/29/2015. 2:27 PM.   Chief Complaint  Patient presents with  . Cough    green  . Nasal Congestion   The history is provided by the patient. No language interpreter was used.   HPI Comments: [name] is a 69 y.o. male who presents to the Emergency Department complaining of constant, moderate, productive cough with yellow sputum onset 1 week ago. He notes that he received a 3 day course of prednisone which he finished 2 days ago and an inhaler after his visit on 12/5 for the same symptoms and denies any relief from either. Pt notes associated sinus pressure, wheezing and sinus congestion. Pt notes that he had cataract surgery this morning w/o complication. He denies any hx of DM. Pt confirms being a dialysis pt. He denies having taken any other recent abx. He denies fevers, vomiting, hemoptysis and hematemesis, chest pain and shortness of breath.   PCP: None  Past Medical History  Diagnosis Date  . Hypertension   . Ruptured cervical disc   . Fall resulting in striking against other object     paralyzed  . Multiple myeloma   . Recurrent multiple myeloma of bone marrow with unknown EBV status ([place])   . Multiple myeloma ([place]) 01/26/2011  . Unspecified vitamin D deficiency 03/06/2013  . Anemia of chronic disease 12/30/2013  . Constipation   . Dialysis patient ([place]) 2016  . Thrombocytopenia ([place]) 08/31/2014  . 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 Constipation    Past Surgical History  Procedure Laterality Date  . Inguinal hernia repair Bilateral   . Anterior cervical decomp/discectomy fusion    .  Bone marrow aspirate and biopsy wiith lumbar puncture    . Melanoma excision      rt. breast  . Cervical spine surgery    . Kyphoplasty Bilateral 01/30/2014    Procedure: Thoracic eleven Kyphoplasty;  Surgeon: Consuella Lose, MD;  Location: [facility];  Service: Neurosurgery;  Laterality: Bilateral;  Thoracic eleven Kyphoplasty  . Av fistula placement Right 04/30/2014    Procedure: BRACHIOCEPHALIC ARTERIOVENOUS (AV) FISTULA CREATION;  Surgeon: Conrad Claremore, MD;  Location: [place];  Service: Vascular;  Laterality: Right;  . Portacath placement Left 02/18/2015    Procedure: INSERTION OF PORT A CATH RIGHT INTERNAL JUGULAR VEIN ;  Surgeon: Conrad Coolidge, MD;  Location: [place];  Service: Vascular;  Laterality: Left;  . Cataract extraction w/phaco Right 04/15/2015    Procedure: CATARACT EXTRACTION PHACO AND INTRAOCULAR LENS PLACEMENT (IOC);  Surgeon: Tonny Branch, MD;  Location: [facility];  Service: Ophthalmology;  Laterality: Right;  CDE:8.97   Family History  Problem Relation Age of Onset  . Cancer Sister   . Cancer Brother    Social History  Substance Use Topics  . Smoking status: Never Smoker   . Smokeless tobacco: Never Used  . Alcohol Use: No    Review of Systems  HENT: Positive for congestion and sinus pressure.   Respiratory: Positive for cough (productive) and wheezing.        Negative hemoptysis  Gastrointestinal: Negative for vomiting.  Negative hematemesis   Allergies  Pamidronate  Home Medications   Prior to Admission medications   Medication Sig Start Date End Date Taking? Authorizing Provider  albuterol (PROVENTIL) (2.5 MG/3ML) 0.083% nebulizer solution Take 3 mLs (2.5 mg total) by nebulization every 6 (six) hours as needed for wheezing or shortness of breath. Patient not taking: Reported on 04/08/2015 09/28/14   Baird Cancer, PA-C  calcium acetate (PHOSLO) 667 MG capsule Take 667 mg by mouth 3 (three) times daily with meals. Take 3 capsules with each meal and 1  capsule with each snack    Historical Provider, MD  cinacalcet (SENSIPAR) 30 MG tablet Take 30 mg by mouth daily.    Historical Provider, MD  docusate sodium (COLACE) 100 MG capsule Take 100 mg by mouth daily.     Historical Provider, MD  DUREZOL 0.05 % EMUL Apply 1 drop to eye 3 (three) times daily. 04/22/15   Historical Provider, MD  HYDROcodone-acetaminophen (NORCO/VICODIN) 5-325 MG tablet Take 1 tablet by mouth every 6 (six) hours as needed for severe pain.  04/11/15   Historical Provider, MD  lenalidomide (REVLIMID) 5 MG capsule Take one tablet every other day, after dialysis 03/06/15   Baird Cancer, PA-C  Omega-3 Fatty Acids (FISH OIL) 1000 MG CAPS Take 2 capsules by mouth daily.    Historical Provider, MD  ondansetron (ZOFRAN) 8 MG tablet Take 1 tablet (8 mg total) by mouth every 8 (eight) hours as needed for nausea or vomiting. 03/15/15   Baird Cancer, PA-C  oxyCODONE-acetaminophen (ROXICET) 5-325 MG tablet Take 1-2 tablets by mouth every 6 (six) hours as needed for moderate pain. Patient not taking: Reported on 04/24/2015 02/18/15   Conrad [place], MD  predniSONE (DELTASONE) 20 MG tablet Take 2 tablets (40 mg total) by mouth daily. 04/25/15   Davonna Belling, MD  pregabalin (LYRICA) 75 MG capsule Take 1 capsule (75 mg total) by mouth 2 (two) times daily. 04/10/15   Manon Hilding Kefalas, PA-C  PROLENSA 0.07 % SOLN Apply 1 drop to eye daily. 04/02/15   Historical Provider, MD  torsemide (DEMADEX) 20 MG tablet Take 40 mg by mouth daily.    Historical Provider, MD   BP 164/73 mmHg  Pulse 81  Temp(Src)   Resp 22  Ht 5' 11"  (1.803 m)  Wt 240 lb (108.863 kg)  BMI 33.49 kg/m2  SpO2 100% Physical Exam  Constitutional: He is oriented to person, place, and time. He appears well-developed and well-nourished.  HENT:  Head: Normocephalic and atraumatic.  Right Ear: Hearing, tympanic membrane, external ear and ear canal normal.  Left Ear: Hearing, tympanic membrane, external ear and ear canal  normal.  Nose: Nose normal.  Mouth/Throat: Uvula is midline, oropharynx is clear and moist and mucous membranes are normal. No oropharyngeal exudate.  Neck: Normal range of motion. No tracheal deviation present.  Cardiovascular: Normal rate.   Pulmonary/Chest: Effort normal. No respiratory distress. He has wheezes.  Coarse lung sounds bilaterally; expiratory and inspiratory wheezes bilaterally   Abdominal: Soft. There is no tenderness.  Musculoskeletal: Normal range of motion.  Neurological: He is alert and oriented to person, place, and time.  Skin: Skin is warm and dry. He is not diaphoretic.  Psychiatric: He has a normal mood and affect. His behavior is normal.  Nursing note and vitals reviewed.  ED Course  Procedures  DIAGNOSTIC STUDIES: Oxygen Saturation is 100% on RA, normal by my interpretation.    COORDINATION OF CARE: 2:23 PM Pt presents  today due to a continuation of his productive cough. Discussed treatment plan and return precautions with pt and he agreed to plan.    2:37 PM Dr. Sabra Heck also evaluated pt and agrees with current treatment plan.   Labs Review Labs Reviewed - No data to display  Imaging Review No results found.    MDM   Final diagnoses:  Bronchitis    Pt is well appearing, vitals stable.  No hypoxia, tachycardia or tachypnea.  No reported shortness of breath.  Has been using an inhaler and took a 3 days course of steroids recently w/o relief.  I will prescribe cough syrup at pt's request, and Z pack.    Pt also seen by Dr. Hazle Nordmann and care plan discussed.  Pt agrees to close f/u if needed.  I personally performed the services described in this documentation, which was scribed in my presence. The recorded information has been reviewed and is accurate.    Kem Parkinson, PA-C 05/01/15 1212  Noemi Chapel, MD 05/01/15 [id number]

## 2015-04-29 NOTE — ED Notes (Signed)
C/o cough and nasal congestion, with green secretions.  Treated last week with 3 days of prednisone with no relief.

## 2015-04-29 NOTE — Op Note (Signed)
Date of Admission: 04/29/2015  Date of Surgery: 04/29/2015   Pre-Op Dx: Cataract Left Eye  Post-Op Dx: Senile Combined Cataract Left  Eye,  Dx Code [id number]  Surgeon: Tonny Branch, M.D.  Assistants: None  Anesthesia: Topical with MAC  Indications: Painless, progressive loss of vision with compromise of daily activities.  Surgery: Cataract Extraction with Intraocular lens Implant Left Eye  Discription: The patient had dilating drops and viscous lidocaine placed into the Left eye in the pre-op holding area. After transfer to the operating room, a time out was performed. The patient was then prepped and draped. Beginning with a 3 degree blade a paracentesis port was made at the surgeon's 2 o'clock position. The anterior chamber was then filled with 1% non-preserved lidocaine. This was followed by filling the anterior chamber with Provisc.  A 2.87mm keratome blade was used to make a clear corneal incision at the temporal limbus.  A bent cystatome needle was used to create a continuous tear capsulotomy. Hydrodissection was performed with balanced salt solution on a Fine canula. The lens nucleus was then removed using the phacoemulsification handpiece. Residual cortex was removed with the I&A handpiece. The anterior chamber and capsular bag were refilled with Provisc. A posterior chamber intraocular lens was placed into the capsular bag with it's injector. The implant was positioned with the Kuglan hook. The Provisc was then removed from the anterior chamber and capsular bag with the I&A handpiece. Stromal hydration of the main incision and paracentesis port was performed with BSS on a Fine canula. The wounds were tested for leak which was negative. The patient tolerated the procedure well. There were no operative complications. The patient was then transferred to the recovery room in stable condition.  Complications: None  Specimen: None  EBL: None  Prosthetic device: Hoya iSert 250, power 23.0 D,  SN [id number].

## 2015-04-29 NOTE — Consult Note (Signed)
I have reviewed the H&P, the patient was re-examined, and I have identified no interval changes in medical condition and plan of care since the history and physical of record  

## 2015-04-30 ENCOUNTER — Encounter (HOSPITAL_COMMUNITY): Payer: Self-pay | Admitting: Ophthalmology

## 2015-05-01 ENCOUNTER — Encounter (HOSPITAL_BASED_OUTPATIENT_CLINIC_OR_DEPARTMENT_OTHER): Payer: Medicare Other

## 2015-05-01 ENCOUNTER — Telehealth (HOSPITAL_COMMUNITY): Payer: Self-pay

## 2015-05-01 DIAGNOSIS — C9000 Multiple myeloma not having achieved remission: Secondary | ICD-10-CM | POA: Diagnosis not present

## 2015-05-01 LAB — CBC WITH DIFFERENTIAL/PLATELET
BASOS PCT: 0 %
Basophils Absolute: 0 10*3/uL (ref 0.0–0.1)
EOS ABS: 0.1 10*3/uL (ref 0.0–0.7)
Eosinophils Relative: 3 %
HCT: 24.9 % — ABNORMAL LOW (ref 39.0–52.0)
HEMOGLOBIN: 8.1 g/dL — AB (ref 13.0–17.0)
LYMPHS ABS: 1 10*3/uL (ref 0.7–4.0)
Lymphocytes Relative: 40 %
MCH: 36.7 pg — AB (ref 26.0–34.0)
MCHC: 32.5 g/dL (ref 30.0–36.0)
MCV: 112.7 fL — ABNORMAL HIGH (ref 78.0–100.0)
MONO ABS: 0.3 10*3/uL (ref 0.1–1.0)
MONOS PCT: 12 %
Neutro Abs: 1.1 10*3/uL — ABNORMAL LOW (ref 1.7–7.7)
Neutrophils Relative %: 44 %
Platelets: 24 10*3/uL — CL (ref 150–400)
RBC: 2.21 MIL/uL — ABNORMAL LOW (ref 4.22–5.81)
RDW: 21.2 % — AB (ref 11.5–15.5)
WBC: 2.5 10*3/uL — ABNORMAL LOW (ref 4.0–10.5)

## 2015-05-01 LAB — PREPARE RBC (CROSSMATCH)

## 2015-05-01 NOTE — Telephone Encounter (Signed)
CRITICAL VALUE ALERT Critical value received:  Platelet count 24,000 Date of notification:  05/01/15  Time of notification: 10:20 Critical value read back:  Yes.   Nurse who received alert:  Mickie Kay, RN  Robynn Pane, PA-C notified at 1021.

## 2015-05-02 NOTE — Progress Notes (Signed)
LABS DRAWN

## 2015-05-03 ENCOUNTER — Encounter (HOSPITAL_BASED_OUTPATIENT_CLINIC_OR_DEPARTMENT_OTHER): Payer: Medicare Other | Admitting: Oncology

## 2015-05-03 ENCOUNTER — Other Ambulatory Visit (HOSPITAL_COMMUNITY): Payer: Medicare Other

## 2015-05-03 ENCOUNTER — Other Ambulatory Visit (HOSPITAL_COMMUNITY): Payer: Self-pay | Admitting: Oncology

## 2015-05-03 ENCOUNTER — Encounter (HOSPITAL_BASED_OUTPATIENT_CLINIC_OR_DEPARTMENT_OTHER): Payer: Medicare Other

## 2015-05-03 VITALS — BP 140/69 | HR 62 | Temp 98.0°F | Resp 16

## 2015-05-03 DIAGNOSIS — R059 Cough, unspecified: Secondary | ICD-10-CM

## 2015-05-03 DIAGNOSIS — R05 Cough: Secondary | ICD-10-CM

## 2015-05-03 DIAGNOSIS — N186 End stage renal disease: Secondary | ICD-10-CM

## 2015-05-03 DIAGNOSIS — Z992 Dependence on renal dialysis: Secondary | ICD-10-CM

## 2015-05-03 DIAGNOSIS — R058 Other specified cough: Secondary | ICD-10-CM

## 2015-05-03 DIAGNOSIS — J189 Pneumonia, unspecified organism: Secondary | ICD-10-CM

## 2015-05-03 DIAGNOSIS — C9000 Multiple myeloma not having achieved remission: Secondary | ICD-10-CM

## 2015-05-03 MED ORDER — DIPHENHYDRAMINE HCL 25 MG PO CAPS
25.0000 mg | ORAL_CAPSULE | Freq: Once | ORAL | Status: AC
  Administered 2015-05-03: 25 mg via ORAL

## 2015-05-03 MED ORDER — ACETAMINOPHEN 325 MG PO TABS
650.0000 mg | ORAL_TABLET | Freq: Once | ORAL | Status: AC
  Administered 2015-05-03: 650 mg via ORAL

## 2015-05-03 MED ORDER — LEVOFLOXACIN 250 MG PO TABS: ORAL_TABLET | ORAL | Status: DC

## 2015-05-03 MED ORDER — SODIUM CHLORIDE 0.9 % IV SOLN
250.0000 mL | Freq: Once | INTRAVENOUS | Status: AC
  Administered 2015-05-03: 250 mL via INTRAVENOUS

## 2015-05-03 MED ORDER — SODIUM CHLORIDE 0.9 % IJ SOLN
10.0000 mL | INTRAMUSCULAR | Status: AC | PRN
  Administered 2015-05-03: 10 mL

## 2015-05-03 MED ORDER — ACETAMINOPHEN 325 MG PO TABS
ORAL_TABLET | ORAL | Status: AC
  Filled 2015-05-03: qty 2

## 2015-05-03 MED ORDER — HEPARIN SOD (PORK) LOCK FLUSH 100 UNIT/ML IV SOLN
500.0000 [IU] | Freq: Every day | INTRAVENOUS | Status: AC | PRN
  Administered 2015-05-03: 500 [IU]

## 2015-05-03 MED ORDER — DIPHENHYDRAMINE HCL 25 MG PO CAPS
ORAL_CAPSULE | ORAL | Status: AC
  Filled 2015-05-03: qty 1

## 2015-05-03 MED ORDER — HEPARIN SOD (PORK) LOCK FLUSH 100 UNIT/ML IV SOLN
INTRAVENOUS | Status: AC
  Filled 2015-05-03: qty 5

## 2015-05-03 NOTE — Progress Notes (Signed)
[name] is seen as a work-in today.  He has been to the ED for URI and treated with a Z-Pak that he just finished.  He feels no better.  Patient's on hemodialysis have increased risk for atypical infections, namely LRI.  As a result, I will give him an Rx for Levaquin.  He will take 750 mg today and then 500 mg every 48 hours.  He reports continued cough with phlegm production.  Patient and plan discussed with Dr. Ancil Linsey and she is in agreement with the aforementioned.   Gwendloyn Forsee, PA-C 05/03/2015 1:28 PM

## 2015-05-03 NOTE — Patient Instructions (Signed)
[place] at [facility] Discharge Instructions  RECOMMENDATIONS MADE BY THE CONSULTANT AND ANY TEST RESULTS WILL BE SENT TO YOUR REFERRING PHYSICIAN.  Today you received a blood transfusion as ordered. Return as scheduled.  Thank you for choosing [place] at [facility] to provide your oncology and hematology care.  To afford each patient quality time with our provider, please arrive at least 15 minutes before your scheduled appointment time.    You need to re-schedule your appointment should you arrive 10 or more minutes late.  We strive to give you quality time with our providers, and arriving late affects you and other patients whose appointments are after yours.  Also, if you no show three or more times for appointments you may be dismissed from the clinic at the providers discretion.     Again, thank you for choosing [facility].  Our hope is that these requests will decrease the amount of time that you wait before being seen by our physicians.       _____________________________________________________________  Should you have questions after your visit to [facility], please contact our office at [phone] between the hours of 8:30 a.m. and 4:30 p.m.  Voicemails left after 4:30 p.m. will not be returned until the following business day.  For prescription refill requests, have your pharmacy contact our office.

## 2015-05-03 NOTE — Progress Notes (Signed)
Patient walked in MD office on way to treatment area. Spoke with Meriel Flavors. Told him he was "sick" with a bad cold and felt terrible. Patient states he had been to the ER twice with respiratory issues and was given 4 days steroids first visit and a zpak the second visit and nothing has helped his cold. Reports he is coughing with productive green sputum. Tom told patient he would RX antibiotic to his pharmacy. Patient verbalized understanding.   1131 tolerated first unit prbc transfusion well.  1345 tolerated second unit prbc transfusion well.  Patient ambulatory on discharge home to self.

## 2015-05-04 LAB — TYPE AND SCREEN
ABO/RH(D): A POS
Antibody Screen: NEGATIVE
UNIT DIVISION: 0
Unit division: 0

## 2015-05-09 ENCOUNTER — Telehealth (HOSPITAL_COMMUNITY): Payer: Self-pay

## 2015-05-09 ENCOUNTER — Encounter (HOSPITAL_BASED_OUTPATIENT_CLINIC_OR_DEPARTMENT_OTHER): Payer: Medicare Other

## 2015-05-09 DIAGNOSIS — C9000 Multiple myeloma not having achieved remission: Secondary | ICD-10-CM | POA: Diagnosis not present

## 2015-05-09 DIAGNOSIS — Z95828 Presence of other vascular implants and grafts: Secondary | ICD-10-CM

## 2015-05-09 LAB — COMPREHENSIVE METABOLIC PANEL
ALBUMIN: 3.1 g/dL — AB (ref 3.5–5.0)
ALK PHOS: 37 U/L — AB (ref 38–126)
ALT: 19 U/L (ref 17–63)
ANION GAP: 8 (ref 5–15)
AST: 23 U/L (ref 15–41)
BILIRUBIN TOTAL: 1.1 mg/dL (ref 0.3–1.2)
BUN: 20 mg/dL (ref 6–20)
CALCIUM: 8.4 mg/dL — AB (ref 8.9–10.3)
CO2: 29 mmol/L (ref 22–32)
Chloride: 99 mmol/L — ABNORMAL LOW (ref 101–111)
Creatinine, Ser: 3.64 mg/dL — ABNORMAL HIGH (ref 0.61–1.24)
GFR calc Af Amer: 18 mL/min — ABNORMAL LOW (ref >60)
GFR, EST NON AFRICAN AMERICAN: 16 mL/min — AB (ref >60)
GLUCOSE: 146 mg/dL — AB (ref 65–99)
Potassium: 3.5 mmol/L (ref 3.5–5.1)
Sodium: 136 mmol/L (ref 135–145)
TOTAL PROTEIN: 6.3 g/dL — AB (ref 6.5–8.1)

## 2015-05-09 LAB — CBC WITH DIFFERENTIAL/PLATELET
BASOS PCT: 1 %
Basophils Absolute: 0 10*3/uL (ref 0.0–0.1)
EOS ABS: 0.1 10*3/uL (ref 0.0–0.7)
EOS PCT: 3 %
HEMATOCRIT: 28.4 % — AB (ref 39.0–52.0)
HEMOGLOBIN: 9.5 g/dL — AB (ref 13.0–17.0)
LYMPHS ABS: 0.7 10*3/uL (ref 0.7–4.0)
Lymphocytes Relative: 41 %
MCH: 35.1 pg — AB (ref 26.0–34.0)
MCHC: 33.5 g/dL (ref 30.0–36.0)
MCV: 104.8 fL — AB (ref 78.0–100.0)
MONO ABS: 0.1 10*3/uL (ref 0.1–1.0)
Monocytes Relative: 3 %
NEUTROS ABS: 0.9 10*3/uL — AB (ref 1.7–7.7)
Neutrophils Relative %: 52 %
Platelets: 20 10*3/uL — CL (ref 150–400)
RBC: 2.71 MIL/uL — ABNORMAL LOW (ref 4.22–5.81)
RDW: 21.6 % — ABNORMAL HIGH (ref 11.5–15.5)
WBC: 1.8 10*3/uL — ABNORMAL LOW (ref 4.0–10.5)

## 2015-05-09 LAB — SAMPLE TO BLOOD BANK

## 2015-05-09 MED ORDER — HEPARIN SOD (PORK) LOCK FLUSH 100 UNIT/ML IV SOLN
INTRAVENOUS | Status: AC
  Filled 2015-05-09: qty 5

## 2015-05-09 MED ORDER — HEPARIN SOD (PORK) LOCK FLUSH 100 UNIT/ML IV SOLN
500.0000 [IU] | Freq: Once | INTRAVENOUS | Status: AC
  Administered 2015-05-09: 500 [IU] via INTRAVENOUS

## 2015-05-09 MED ORDER — SODIUM CHLORIDE 0.9 % IJ SOLN
10.0000 mL | Freq: Once | INTRAMUSCULAR | Status: AC
  Administered 2015-05-09: 10 mL via INTRAVENOUS

## 2015-05-09 NOTE — Progress Notes (Signed)
[name] presented for Portacath access and flush. Proper placement of portacath confirmed by CXR. Portacath located right chest wall accessed with  H 20 needle. Good blood return present. Portacath flushed with 70ml NS and 500U/31ml Heparin and needle removed intact. Procedure without incident. Patient tolerated procedure well.

## 2015-05-09 NOTE — Addendum Note (Signed)
Addended by: Kurtis Bushman A on: 05/09/2015 05:11 PM   Modules accepted: Orders, SmartSet

## 2015-05-09 NOTE — Progress Notes (Signed)
Platelet count 20,000/ hgb 9.5. Orders received and patient notified of platelet transfusion for 05/10/2015.

## 2015-05-09 NOTE — Telephone Encounter (Signed)
CRITICAL VALUE ALERT Critical value received:  Platelet count 20,000 Date of notification:  05/09/15  Time of notification: 1700  Critical value read back:  Yes.   Nurse who received alert:  Mickie Kay, RN MD notified (1st page):  Robynn Pane, PA-C @ [phone] - orders received.

## 2015-05-10 ENCOUNTER — Other Ambulatory Visit (HOSPITAL_COMMUNITY): Payer: Medicare Other

## 2015-05-10 ENCOUNTER — Encounter (HOSPITAL_BASED_OUTPATIENT_CLINIC_OR_DEPARTMENT_OTHER): Payer: Medicare Other

## 2015-05-10 VITALS — BP 153/82 | HR 50 | Temp 98.2°F | Resp 16

## 2015-05-10 DIAGNOSIS — C9000 Multiple myeloma not having achieved remission: Secondary | ICD-10-CM

## 2015-05-10 DIAGNOSIS — Z95828 Presence of other vascular implants and grafts: Secondary | ICD-10-CM

## 2015-05-10 DIAGNOSIS — D696 Thrombocytopenia, unspecified: Secondary | ICD-10-CM | POA: Diagnosis not present

## 2015-05-10 MED ORDER — DIPHENHYDRAMINE HCL 25 MG PO CAPS
ORAL_CAPSULE | ORAL | Status: AC
  Filled 2015-05-10: qty 1

## 2015-05-10 MED ORDER — DIPHENHYDRAMINE HCL 25 MG PO CAPS
25.0000 mg | ORAL_CAPSULE | Freq: Once | ORAL | Status: AC
  Administered 2015-05-10: 25 mg via ORAL

## 2015-05-10 MED ORDER — SODIUM CHLORIDE 0.9 % IJ SOLN
10.0000 mL | INTRAMUSCULAR | Status: AC | PRN
  Administered 2015-05-10: 10 mL

## 2015-05-10 MED ORDER — SODIUM CHLORIDE 0.9 % IV SOLN
250.0000 mL | Freq: Once | INTRAVENOUS | Status: AC
  Administered 2015-05-10: 250 mL via INTRAVENOUS

## 2015-05-10 MED ORDER — ACETAMINOPHEN 325 MG PO TABS
ORAL_TABLET | ORAL | Status: AC
  Filled 2015-05-10: qty 2

## 2015-05-10 MED ORDER — HEPARIN SOD (PORK) LOCK FLUSH 100 UNIT/ML IV SOLN
500.0000 [IU] | Freq: Every day | INTRAVENOUS | Status: AC | PRN
  Administered 2015-05-10: 500 [IU]
  Filled 2015-05-10: qty 5

## 2015-05-10 MED ORDER — ACETAMINOPHEN 325 MG PO TABS
650.0000 mg | ORAL_TABLET | Freq: Once | ORAL | Status: AC
  Administered 2015-05-10: 650 mg via ORAL

## 2015-05-10 NOTE — Progress Notes (Signed)
Tolerated well

## 2015-05-10 NOTE — Patient Instructions (Signed)
..  [place] at [facility] Discharge Instructions  RECOMMENDATIONS MADE BY THE CONSULTANT AND ANY TEST RESULTS WILL BE SENT TO YOUR REFERRING PHYSICIAN.  Platelet transfusion today Return Jan 20th as scheduled   Thank you for choosing [place] at [facility] to provide your oncology and hematology care.  To afford each patient quality time with our provider, please arrive at least 15 minutes before your scheduled appointment time.    You need to re-schedule your appointment should you arrive 10 or more minutes late.  We strive to give you quality time with our providers, and arriving late affects you and other patients whose appointments are after yours.  Also, if you no show three or more times for appointments you may be dismissed from the clinic at the providers discretion.     Again, thank you for choosing [facility].  Our hope is that these requests will decrease the amount of time that you wait before being seen by our physicians.       _____________________________________________________________  Should you have questions after your visit to [facility], please contact our office at [phone] between the hours of 8:30 a.m. and 4:30 p.m.  Voicemails left after 4:30 p.m. will not be returned until the following business day.  For prescription refill requests, have your pharmacy contact our office.

## 2015-05-12 LAB — PREPARE PLATELET PHERESIS: UNIT DIVISION: 0

## 2015-05-14 ENCOUNTER — Other Ambulatory Visit (HOSPITAL_COMMUNITY): Payer: Self-pay | Admitting: Oncology

## 2015-05-14 DIAGNOSIS — C9000 Multiple myeloma not having achieved remission: Secondary | ICD-10-CM

## 2015-05-14 MED ORDER — LENALIDOMIDE 5 MG PO CAPS: ORAL_CAPSULE | ORAL | Status: DC

## 2015-05-22 ENCOUNTER — Encounter (HOSPITAL_COMMUNITY): Payer: Medicare Other | Attending: Oncology

## 2015-05-22 ENCOUNTER — Telehealth (HOSPITAL_COMMUNITY): Payer: Self-pay | Admitting: Emergency Medicine

## 2015-05-22 DIAGNOSIS — C9000 Multiple myeloma not having achieved remission: Secondary | ICD-10-CM

## 2015-05-22 LAB — CBC WITH DIFFERENTIAL/PLATELET
BASOS ABS: 0 10*3/uL (ref 0.0–0.1)
Basophils Relative: 0 %
EOS PCT: 2 %
Eosinophils Absolute: 0.1 10*3/uL (ref 0.0–0.7)
HEMATOCRIT: 28.1 % — AB (ref 39.0–52.0)
Hemoglobin: 9.2 g/dL — ABNORMAL LOW (ref 13.0–17.0)
LYMPHS ABS: 0.9 10*3/uL (ref 0.7–4.0)
LYMPHS PCT: 42 %
MCH: 35.9 pg — ABNORMAL HIGH (ref 26.0–34.0)
MCHC: 32.7 g/dL (ref 30.0–36.0)
MCV: 109.8 fL — AB (ref 78.0–100.0)
Monocytes Absolute: 0.2 10*3/uL (ref 0.1–1.0)
Monocytes Relative: 8 %
NEUTROS ABS: 1 10*3/uL — AB (ref 1.7–7.7)
NEUTROS PCT: 47 %
PLATELETS: 21 10*3/uL — AB (ref 150–400)
RBC: 2.56 MIL/uL — AB (ref 4.22–5.81)
RDW: 23.2 % — AB (ref 11.5–15.5)
SMEAR REVIEW: DECREASED
WBC: 2.1 10*3/uL — AB (ref 4.0–10.5)

## 2015-05-22 LAB — COMPREHENSIVE METABOLIC PANEL
ALBUMIN: 3.4 g/dL — AB (ref 3.5–5.0)
ALT: 15 U/L — AB (ref 17–63)
AST: 19 U/L (ref 15–41)
Alkaline Phosphatase: 43 U/L (ref 38–126)
Anion gap: 9 (ref 5–15)
BILIRUBIN TOTAL: 0.8 mg/dL (ref 0.3–1.2)
BUN: 36 mg/dL — AB (ref 6–20)
CHLORIDE: 100 mmol/L — AB (ref 101–111)
CO2: 31 mmol/L (ref 22–32)
CREATININE: 5.61 mg/dL — AB (ref 0.61–1.24)
Calcium: 8.8 mg/dL — ABNORMAL LOW (ref 8.9–10.3)
GFR calc Af Amer: 11 mL/min — ABNORMAL LOW (ref >60)
GFR calc non Af Amer: 9 mL/min — ABNORMAL LOW (ref >60)
GLUCOSE: 149 mg/dL — AB (ref 65–99)
POTASSIUM: 4.1 mmol/L (ref 3.5–5.1)
Sodium: 140 mmol/L (ref 135–145)
Total Protein: 6.8 g/dL (ref 6.5–8.1)

## 2015-05-22 LAB — LACTATE DEHYDROGENASE: LDH: 209 U/L — AB (ref 98–192)

## 2015-05-22 LAB — SEDIMENTATION RATE: SED RATE: 15 mm/h (ref 0–16)

## 2015-05-22 LAB — C-REACTIVE PROTEIN: CRP: 1.1 mg/dL — AB (ref <1.0)

## 2015-05-22 NOTE — Progress Notes (Signed)
CRITICAL VALUE ALERT Critical value received:  Platelets 21,000 Date of notification:  05/22/2015 Time of notification: [id number] Critical value read back:  Yes.   Nurse who received alert:  Isidoro Donning RN   Hgb was not critical but I requested that info: HGB 9.2

## 2015-05-22 NOTE — Telephone Encounter (Signed)
Pt called and I informed him that labs were good no transfusion needed, labs when needed, pt is coming to pick up copy of lab report

## 2015-05-22 NOTE — Telephone Encounter (Signed)
-----   Message from Baird Cancer, PA-C sent at 05/22/2015  2:11 PM EST ----- I have reviewed all lab results which are normal or stable. Please inform the patient.  Platelet count > 20,000, no transfusion.

## 2015-05-23 ENCOUNTER — Telehealth (HOSPITAL_COMMUNITY): Payer: Self-pay | Admitting: Emergency Medicine

## 2015-05-23 LAB — PROTEIN ELECTROPHORESIS, SERUM
A/G RATIO SPE: 1.4 (ref 0.7–1.7)
Albumin ELP: 3.6 g/dL (ref 2.9–4.4)
Alpha-1-Globulin: 0.2 g/dL (ref 0.0–0.4)
Alpha-2-Globulin: 0.4 g/dL (ref 0.4–1.0)
Beta Globulin: 1 g/dL (ref 0.7–1.3)
GLOBULIN, TOTAL: 2.6 g/dL (ref 2.2–3.9)
Gamma Globulin: 1 g/dL (ref 0.4–1.8)
Total Protein ELP: 6.2 g/dL (ref 6.0–8.5)

## 2015-05-23 LAB — BETA 2 MICROGLOBULIN, SERUM: Beta-2 Microglobulin: 17 mg/L — ABNORMAL HIGH (ref 0.6–2.4)

## 2015-05-23 LAB — KAPPA/LAMBDA LIGHT CHAINS
Kappa free light chain: 230.44 mg/L — ABNORMAL HIGH (ref 3.30–19.40)
Kappa, lambda light chain ratio: 2.85 — ABNORMAL HIGH (ref 0.26–1.65)
Lambda free light chains: 80.78 mg/L — ABNORMAL HIGH (ref 5.71–26.30)

## 2015-05-23 LAB — IGG, IGA, IGM
IGA: 352 mg/dL (ref 61–437)
IGM, SERUM: 78 mg/dL (ref 20–172)
IgG (Immunoglobin G), Serum: 865 mg/dL (ref 700–1600)

## 2015-05-23 NOTE — Telephone Encounter (Signed)
Pt notified of lab results

## 2015-05-23 NOTE — Progress Notes (Signed)
LABS DRAWN

## 2015-05-23 NOTE — Telephone Encounter (Signed)
-----   Message from Baird Cancer, PA-C sent at 05/23/2015  9:20 AM EST ----- I have reviewed all lab results which are normal or stable. Please inform the patient.  Some labs are pending, but what is back thus far is stable.

## 2015-05-24 ENCOUNTER — Other Ambulatory Visit (HOSPITAL_COMMUNITY): Payer: Medicare Other

## 2015-05-27 LAB — IMMUNOFIXATION ELECTROPHORESIS
IGM, SERUM: 81 mg/dL (ref 20–172)
IgA: 365 mg/dL (ref 61–437)
IgG (Immunoglobin G), Serum: 887 mg/dL (ref 700–1600)
Total Protein ELP: 6.6 g/dL (ref 6.0–8.5)

## 2015-06-04 ENCOUNTER — Encounter (HOSPITAL_BASED_OUTPATIENT_CLINIC_OR_DEPARTMENT_OTHER): Payer: Medicare Other

## 2015-06-04 ENCOUNTER — Telehealth (HOSPITAL_COMMUNITY): Payer: Self-pay

## 2015-06-04 ENCOUNTER — Encounter (HOSPITAL_COMMUNITY): Payer: Self-pay

## 2015-06-04 DIAGNOSIS — C9000 Multiple myeloma not having achieved remission: Secondary | ICD-10-CM

## 2015-06-04 DIAGNOSIS — Z452 Encounter for adjustment and management of vascular access device: Secondary | ICD-10-CM

## 2015-06-04 LAB — CBC WITH DIFFERENTIAL/PLATELET
BASOS PCT: 0 %
Basophils Absolute: 0 10*3/uL (ref 0.0–0.1)
EOS ABS: 0.1 10*3/uL (ref 0.0–0.7)
Eosinophils Relative: 3 %
HCT: 24.2 % — ABNORMAL LOW (ref 39.0–52.0)
Hemoglobin: 8 g/dL — ABNORMAL LOW (ref 13.0–17.0)
Lymphocytes Relative: 40 %
Lymphs Abs: 0.8 10*3/uL (ref 0.7–4.0)
MCH: 37 pg — AB (ref 26.0–34.0)
MCHC: 33.1 g/dL (ref 30.0–36.0)
MCV: 112 fL — ABNORMAL HIGH (ref 78.0–100.0)
MONOS PCT: 7 %
Monocytes Absolute: 0.2 10*3/uL (ref 0.1–1.0)
NEUTROS PCT: 50 %
Neutro Abs: 1.1 10*3/uL — ABNORMAL LOW (ref 1.7–7.7)
PLATELETS: 13 10*3/uL — AB (ref 150–400)
RBC: 2.16 MIL/uL — ABNORMAL LOW (ref 4.22–5.81)
RDW: 22.4 % — ABNORMAL HIGH (ref 11.5–15.5)
WBC: 2.1 10*3/uL — AB (ref 4.0–10.5)

## 2015-06-04 LAB — PREPARE RBC (CROSSMATCH)

## 2015-06-04 MED ORDER — HEPARIN SOD (PORK) LOCK FLUSH 100 UNIT/ML IV SOLN
500.0000 [IU] | Freq: Once | INTRAVENOUS | Status: AC
  Administered 2015-06-04: 500 [IU] via INTRAVENOUS

## 2015-06-04 MED ORDER — HEPARIN SOD (PORK) LOCK FLUSH 100 UNIT/ML IV SOLN
INTRAVENOUS | Status: AC
  Filled 2015-06-04: qty 5

## 2015-06-04 MED ORDER — SODIUM CHLORIDE 0.9 % IJ SOLN
10.0000 mL | INTRAMUSCULAR | Status: DC | PRN
  Administered 2015-06-04: 10 mL via INTRAVENOUS
  Filled 2015-06-04: qty 10

## 2015-06-04 NOTE — Telephone Encounter (Signed)
Received critical-platelets were 13,0000 Dr. Whitney Muse notified. Platelets transfusion needed per Dr.Penland Will put orders in per protocol.

## 2015-06-04 NOTE — Addendum Note (Signed)
Addended by: Kurtis Bushman A on: 06/04/2015 04:56 PM   Modules accepted: Orders, SmartSet

## 2015-06-04 NOTE — Progress Notes (Signed)
..  [name]'s reason for visit today are for labs as scheduled per MD orders.  Venipuncture performed with a 23 gauge butterfly needle to L Antecubital, Unsuccessful.  Marland KitchenJacqualine [name] presented for Portacath access and flush.    Portacath located rt chest wall accessed with  H 20 needle.  Good blood return present. Portacath flushed with 60ml NS and 500U/36ml Heparin and needle removed intact.  Procedure tolerated well and without incident.

## 2015-06-05 ENCOUNTER — Encounter (HOSPITAL_BASED_OUTPATIENT_CLINIC_OR_DEPARTMENT_OTHER): Payer: Medicare Other

## 2015-06-05 VITALS — BP 142/85 | HR 80 | Temp 98.3°F | Resp 18

## 2015-06-05 DIAGNOSIS — C9000 Multiple myeloma not having achieved remission: Secondary | ICD-10-CM | POA: Diagnosis not present

## 2015-06-05 MED ORDER — DIPHENHYDRAMINE HCL 25 MG PO CAPS
25.0000 mg | ORAL_CAPSULE | Freq: Once | ORAL | Status: AC
  Administered 2015-06-05: 25 mg via ORAL

## 2015-06-05 MED ORDER — ACETAMINOPHEN 325 MG PO TABS
650.0000 mg | ORAL_TABLET | Freq: Once | ORAL | Status: AC
  Administered 2015-06-05: 650 mg via ORAL

## 2015-06-05 MED ORDER — HEPARIN SOD (PORK) LOCK FLUSH 100 UNIT/ML IV SOLN
500.0000 [IU] | Freq: Once | INTRAVENOUS | Status: AC
  Administered 2015-06-05: 500 [IU] via INTRAVENOUS

## 2015-06-05 MED ORDER — SODIUM CHLORIDE 0.9 % IV SOLN
INTRAVENOUS | Status: DC
  Administered 2015-06-05: 11:00:00 via INTRAVENOUS

## 2015-06-05 MED ORDER — DIPHENHYDRAMINE HCL 25 MG PO CAPS
ORAL_CAPSULE | ORAL | Status: AC
  Filled 2015-06-05: qty 1

## 2015-06-05 MED ORDER — ACETAMINOPHEN 325 MG PO TABS
ORAL_TABLET | ORAL | Status: AC
  Filled 2015-06-05: qty 2

## 2015-06-05 NOTE — Patient Instructions (Signed)
[name] at [facility] Discharge Instructions  RECOMMENDATIONS MADE BY THE CONSULTANT AND ANY TEST RESULTS WILL BE SENT TO YOUR REFERRING PHYSICIAN.  One unit of platelets today as well as one unit of blood.   Please return as scheduled.    Thank you for choosing [place] at [facility] to provide your oncology and hematology care.  To afford each patient quality time with our provider, please arrive at least 15 minutes before your scheduled appointment time.    You need to re-schedule your appointment should you arrive 10 or more minutes late.  We strive to give you quality time with our providers, and arriving late affects you and other patients whose appointments are after yours.  Also, if you no show three or more times for appointments you may be dismissed from the clinic at the providers discretion.     Again, thank you for choosing [facility].  Our hope is that these requests will decrease the amount of time that you wait before being seen by our physicians.       _____________________________________________________________  Should you have questions after your visit to [facility], please contact our office at [phone] between the hours of 8:30 a.m. and 4:30 p.m.  Voicemails left after 4:30 p.m. will not be returned until the following business day.  For prescription refill requests, have your pharmacy contact our office.

## 2015-06-05 NOTE — Progress Notes (Signed)
Patient tolerated infusion well.  VSS throughout.   

## 2015-06-06 LAB — TYPE AND SCREEN
ABO/RH(D): A POS
ANTIBODY SCREEN: NEGATIVE
UNIT DIVISION: 0

## 2015-06-06 LAB — PREPARE PLATELET PHERESIS: UNIT DIVISION: 0

## 2015-06-07 ENCOUNTER — Telehealth (HOSPITAL_COMMUNITY): Payer: Self-pay | Admitting: *Deleted

## 2015-06-07 ENCOUNTER — Other Ambulatory Visit (HOSPITAL_COMMUNITY): Payer: Medicare Other

## 2015-06-07 ENCOUNTER — Ambulatory Visit (HOSPITAL_COMMUNITY): Payer: Medicare Other | Admitting: Oncology

## 2015-06-07 ENCOUNTER — Other Ambulatory Visit (HOSPITAL_COMMUNITY): Payer: Self-pay | Admitting: Podiatry

## 2015-06-07 DIAGNOSIS — L819 Disorder of pigmentation, unspecified: Secondary | ICD-10-CM

## 2015-06-07 NOTE — Telephone Encounter (Signed)
Patient does not want prednisone

## 2015-06-07 NOTE — Telephone Encounter (Signed)
I'll do a Prednisone pulse, but not cortisone.  Let me know if he is interested in a Prednisone pulse.   [name] 06/07/2015 4:37 PM

## 2015-06-09 NOTE — Progress Notes (Signed)
No PCP Per Patient No address on file  Multiple myeloma, remission status unspecified ([place]) - Plan: CBC with Differential, CBC with Differential, Comprehensive metabolic panel, Lactate dehydrogenase, Sedimentation rate, Kappa/lambda light chains, Beta 2 microglobuline, serum, IgG, IgA, IgM, Immunofixation electrophoresis, Protein electrophoresis, serum, C-reactive protein, CBC, Comprehensive metabolic panel  CURRENT THERAPY: Revlimid 5 mg every other day (patient refusing an increase).  INTERVAL HISTORY: [name] 70 y.o. male returns for followup of multiple myeloma, not active at this time, S/P 2 autologous BM transplant at [facility] under the guidance of Dr. Marcell Anger (retired) with the second bone marrow transplant occuring in July 2013. Recent bone marrow demonstrates 5% plasma cells and he was seen by Dr. Norma Fredrickson who recommended starting Revlimid at a dose of 5 mg 21/28 days. His Revlimid dosing has been complicated by pruritic rash and [name] has decided to take Revlimid every other day which he is tolerating well.  Will try to increase the dose in the future.    Multiple myeloma ([place])   01/26/2011 Initial Diagnosis Multiple myeloma   09/10/2014 - 09/14/2014 Chemotherapy Revlimid 5 mg days 1-21 every 28 days   09/14/2014 Adverse Reaction Rash, Revlimid versus Doxycycline   09/21/2014 - 09/23/2014 Chemotherapy Revlimid 5 mg days 1-21 every 28 days- rechallenge.   09/24/2014 Adverse Reaction Rash.  Revlimid-induced   10/22/2014 -  Chemotherapy Revlimid 5 mg every other day.    I personally reviewed and went over laboratory results with the patient.  The results are noted within this dictation.  He reports increased neck pain from his past surgery.  He notes that Lyrica has helped some.  He called last week wanting PO Hydrocortisone and I declined to prescribe.  I informed the patient that I would be willing to prescribe a prednisone pulse.  I educated him on the improved  anti-inflammatory effect of Prednisone compared to hydrocortisone and its longer half-life.  He will consider these options.  He otherwise is doing well.  He denies any upcoming appointments at [facility]. He continues to try to compared his Pomalyst dosing in the past to his current Revlimid dose.  He is again educated that he cannot do that as they are different medications with different potency.   Past Medical History  Diagnosis Date  . Hypertension   . Ruptured cervical disc   . Fall resulting in striking against other object     paralyzed  . Multiple myeloma   . Recurrent multiple myeloma of bone marrow with unknown EBV status ([place])   . Multiple myeloma ([place]) 01/26/2011  . Unspecified vitamin D deficiency 03/06/2013  . Anemia of chronic disease 12/30/2013  . Constipation   . Dialysis patient ([place]) 2016  . Thrombocytopenia ([place]) 08/31/2014  . 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 Constipation     has Essential hypertension, benign; BRADYCARDIA; Multiple myeloma ([place]); Intravenous pamidronate causing adverse effect in therapeutic use; Unspecified vitamin D deficiency; Lymphedema of lower extremity; Anemia of chronic disease; Chronic renal disease, stage 5, glomerular filtration rate less than or equal to 15 mL/min/1.73 square meter ([place]); Pathologic fracture; Anemia in chronic kidney disease; Thrombocytopenia ([place]); Bleeding; Anemia; Weakness generalized; ESRD on dialysis ([place]); and Anemia, chronic renal failure on his problem list.     is allergic to pamidronate.  Current Outpatient Prescriptions on File Prior to Visit  Medication Sig Dispense Refill  .  albuterol (PROVENTIL) (2.5 MG/3ML) 0.083% nebulizer solution Take 3 mLs (2.5 mg total) by nebulization every 6 (six) hours as needed for wheezing or shortness of breath. (Patient not taking: Reported on 04/08/2015) 20 vial 1  .  calcium acetate (PHOSLO) 667 MG capsule Take 667 mg by mouth 3 (three) times daily with meals. Take 3 capsules with each meal and 1 capsule with each snack    . chlorpheniramine-HYDROcodone (TUSSIONEX PENNKINETIC ER) 10-8 MG/5ML SUER Take 5 mLs by mouth every 12 (twelve) hours as needed for cough. 60 mL 0  . cinacalcet (SENSIPAR) 30 MG tablet Take 30 mg by mouth daily.    Marland Kitchen docusate sodium (COLACE) 100 MG capsule Take 100 mg by mouth daily.     . DUREZOL 0.05 % EMUL Apply 1 drop to eye 3 (three) times daily.    Marland Kitchen HYDROcodone-acetaminophen (NORCO/VICODIN) 5-325 MG tablet Take 1 tablet by mouth every 6 (six) hours as needed for severe pain.     Marland Kitchen lenalidomide (REVLIMID) 5 MG capsule Take one tablet every other day, after dialysis 14 capsule 1  . levofloxacin (LEVAQUIN) 250 MG tablet Take 750 mg today, the 500 mg every other day until Rx is complete 9 tablet 0  . Omega-3 Fatty Acids (FISH OIL) 1000 MG CAPS Take 2 capsules by mouth daily.    . ondansetron (ZOFRAN) 8 MG tablet Take 1 tablet (8 mg total) by mouth every 8 (eight) hours as needed for nausea or vomiting. 30 tablet 1  . oxyCODONE-acetaminophen (ROXICET) 5-325 MG tablet Take 1-2 tablets by mouth every 6 (six) hours as needed for moderate pain. (Patient not taking: Reported on 04/24/2015) 10 tablet 0  . predniSONE (DELTASONE) 20 MG tablet Take 2 tablets (40 mg total) by mouth daily. 6 tablet 0  . pregabalin (LYRICA) 75 MG capsule Take 1 capsule (75 mg total) by mouth 2 (two) times daily. 60 capsule 1  . PROLENSA 0.07 % SOLN Apply 1 drop to eye daily.    Marland Kitchen senna-docusate (SENOKOT-S) 8.6-50 MG tablet Take by mouth.    . torsemide (DEMADEX) 20 MG tablet Take 40 mg by mouth daily.     No current facility-administered medications on file prior to visit.    Past Surgical History  Procedure Laterality Date  . Inguinal hernia repair Bilateral   . Anterior cervical decomp/discectomy fusion    . Bone marrow aspirate and biopsy wiith lumbar puncture     . Melanoma excision      rt. breast  . Cervical spine surgery    . Kyphoplasty Bilateral 01/30/2014    Procedure: Thoracic eleven Kyphoplasty;  Surgeon: Consuella Lose, MD;  Location: [facility];  Service: Neurosurgery;  Laterality: Bilateral;  Thoracic eleven Kyphoplasty  . Av fistula placement Right 04/30/2014    Procedure: BRACHIOCEPHALIC ARTERIOVENOUS (AV) FISTULA CREATION;  Surgeon: Conrad [place], MD;  Location: [place];  Service: Vascular;  Laterality: Right;  . Portacath placement Left 02/18/2015    Procedure: INSERTION OF PORT A CATH RIGHT INTERNAL JUGULAR VEIN ;  Surgeon: Conrad [place], MD;  Location: [place];  Service: Vascular;  Laterality: Left;  . Cataract extraction w/phaco Right 04/15/2015    Procedure: CATARACT EXTRACTION PHACO AND INTRAOCULAR LENS PLACEMENT (IOC);  Surgeon: Tonny Branch, MD;  Location: [facility];  Service: Ophthalmology;  Laterality: Right;  CDE:8.97  . Cataract extraction w/phaco Left 04/29/2015    Procedure: CATARACT EXTRACTION PHACO AND INTRAOCULAR LENS PLACEMENT ; CDE:  7.61;  Surgeon: Tonny Branch, MD;  Location:  [facility];  Service: Ophthalmology;  Laterality: Left;    Denies any headaches, dizziness, double vision, fevers, chills, night sweats, nausea, vomiting, diarrhea, constipation, chest pain, heart palpitations, shortness of breath, blood in stool, black tarry stool, urinary pain, urinary burning, urinary frequency, hematuria.   PHYSICAL EXAMINATION  ECOG PERFORMANCE STATUS: 1 - Symptomatic but completely ambulatory  Filed Vitals:   06/10/15 1430  BP: 164/94  Pulse: 81  Temp: 97.9 F (36.6 C)  Resp: 18    GENERAL:alert, no distress, well nourished, well developed, comfortable, cooperative, obese and smiling, unaccompanied today, and joking around today. SKIN: skin color, texture, turgor are normal, no rashes or significant lesions HEAD: Normocephalic, No masses, lesions, tenderness or abnormalities EYES: normal, PERRLA, EOMI, Conjunctiva are pink  and non-injected EARS: External ears normal OROPHARYNX:lips, buccal mucosa, and tongue normal and mucous membranes are moist  NECK: supple, trachea midline LYMPH:  not examined BREAST:not examined CARDIAC: RRR without murmur, rub, or gallop. LUNGS: CTA B/L without wheezing, rales, or rhonchi. ABDOMEN: + BS x 4 quadrants.  Soft, nontender. BACK: Back symmetric, no curvature. EXTREMITIES:less then 2 second capillary refill, no joint deformities, effusion, or inflammation, no skin discoloration  NEURO: alert & oriented x 3 with fluent speech, no focal motor/sensory deficits, gait normal   LABORATORY DATA: CBC    Component Value Date/Time   WBC 2.1* 06/04/2015 1500   RBC 2.16* 06/04/2015 1500   HGB 8.0* 06/04/2015 1500   HCT 24.2* 06/04/2015 1500   PLT 13* 06/04/2015 1500   MCV 112.0* 06/04/2015 1500   MCH 37.0* 06/04/2015 1500   MCHC 33.1 06/04/2015 1500   RDW 22.4* 06/04/2015 1500   LYMPHSABS 0.8 06/04/2015 1500   MONOABS 0.2 06/04/2015 1500   EOSABS 0.1 06/04/2015 1500   BASOSABS 0.0 06/04/2015 1500      Chemistry      Component Value Date/Time   NA 140 05/22/2015 0922   K 4.1 05/22/2015 0922   CL 100* 05/22/2015 0922   CO2 31 05/22/2015 0922   BUN 36* 05/22/2015 0922   CREATININE 5.61* 05/22/2015 0922   CREATININE 43.09* 01/26/2012 1052      Component Value Date/Time   CALCIUM 8.8* 05/22/2015 0922   ALKPHOS 43 05/22/2015 0922   AST 19 05/22/2015 0922   ALT 15* 05/22/2015 0922   BILITOT 0.8 05/22/2015 0922     Lab Results  Component Value Date   PROT 6.8 05/22/2015   ALBUMINELP 3.6 05/22/2015   A1GS 0.2 05/22/2015   A2GS 0.4 05/22/2015   BETS 1.0 05/22/2015   BETA2SER 4.1 05/16/2014   GAMS 1.0 05/22/2015   MSPIKE Not Observed 05/22/2015   SPEI Comment 05/22/2015   SPECOM Comment 05/22/2015   IGGSERUM 865 05/22/2015   IGGSERUM 887 05/22/2015   IGA 352 05/22/2015   IGA 365 05/22/2015   IGMSERUM 78 05/22/2015   IGMSERUM 81 05/22/2015   IMMELINT (NOTE)  05/16/2014   KPAFRELGTCHN 230.44* 05/22/2015   LAMBDASER 80.78* 05/22/2015   KAPLAMBRATIO 2.85* 05/22/2015    PENDING LABS:   RADIOGRAPHIC STUDIES:  No results found.   PATHOLOGY:    ASSESSMENT AND PLAN:  Multiple myeloma Multiple myeloma, not currently active, S/P 2 autologous BM transplant at [facility] under the guidance of Dr. Marcell Anger (retired) with the second bone marrow transplant occuring in July 2013. Recent bone marrow demonstrates 5% plasma cells and he was seen by Dr. Norma Fredrickson who recommended starting Revlimid at a dose of 5 mg 21/28 days. His Revlimid dosing has been complicated by  pruritic rash and [name] has decided to take Revlimid every other day which he is tolerating well.  Will continue to try to increase the dose in the future.  Chart reviewed.  Labs in 2 week: CBC diff, CMET, LDH, ESR, CRP, B2M, MM panel.  Labs in 4 weeks: CBC.  Labs in 6 weeks: CBC diff, CMET, LDH, ESR, CRP, B2M, MM panel.  He called on Friday wanting an Rx for Hydrocortisone for his pain.  I have recommended a prednisone pulse instead given its longer mechanism of action and its improved anti-inflammatory effect.  Return in 6 weeks for follow-up.    We have referred the patient to Dr. Ace Gins (pain clinic) for consideration of pain management of neck.  We will need to check on this because he is interested in seeing a specialist regarding his chronic pain.  He is resistant to changes in Revlimid dosing given patient reported tolerability issues.  He will continue the same dose.  Since our discussion regarding his behavior in the clinic, I have not heard of any issues from staff about [name].  I hope this continues.   Previously, I have discussed the patient's transfusion needs with his nephrologist, Dr. Lowanda Foster, who is agreeable to allow [place] to continue to transfuse when needed despite the patient's 3x/week hemodialysis.   THERAPY PLAN:  Continue Revlimid every other day.  Will try to  increase dose to 21/28 days in the future.  All questions were answered. The patient knows to call the clinic with any problems, questions or concerns. We can certainly see the patient much sooner if necessary.  Patient and plan discussed with Dr. Ancil Linsey and she is in agreement with the aforementioned.   This note is electronically signed by: Doy Mince 06/10/2015 5:19 PM

## 2015-06-09 NOTE — Assessment & Plan Note (Addendum)
Multiple myeloma, not currently active, S/P 2 autologous BM transplant at [facility] under the guidance of Dr. Marcell Anger (retired) with the second bone marrow transplant occuring in July 2013. Recent bone marrow demonstrates 5% plasma cells and he was seen by Dr. Norma Fredrickson who recommended starting Revlimid at a dose of 5 mg 21/28 days. His Revlimid dosing has been complicated by pruritic rash and [name] has decided to take Revlimid every other day which he is tolerating well.  Will continue to try to increase the dose in the future.  Chart reviewed.  Labs in 2 week: CBC diff, CMET, LDH, ESR, CRP, B2M, MM panel.  Labs in 4 weeks: CBC.  Labs in 6 weeks: CBC diff, CMET, LDH, ESR, CRP, B2M, MM panel.  He called on Friday wanting an Rx for Hydrocortisone for his pain.  I have recommended a prednisone pulse instead given its longer mechanism of action and its improved anti-inflammatory effect.  Return in 6 weeks for follow-up.    We have referred the patient to Dr. Ace Gins (pain clinic) for consideration of pain management of neck.  We will need to check on this because he is interested in seeing a specialist regarding his chronic pain.  He is resistant to changes in Revlimid dosing given patient reported tolerability issues.  He will continue the same dose.  Since our discussion regarding his behavior in the clinic, I have not heard of any issues from staff about [name].  I hope this continues.   Previously, I have discussed the patient's transfusion needs with his nephrologist, Dr. Lowanda Foster, who is agreeable to allow [place] to continue to transfuse when needed despite the patient's 3x/week hemodialysis.

## 2015-06-10 ENCOUNTER — Encounter (HOSPITAL_BASED_OUTPATIENT_CLINIC_OR_DEPARTMENT_OTHER): Payer: Medicare Other | Admitting: Oncology

## 2015-06-10 ENCOUNTER — Encounter (HOSPITAL_COMMUNITY): Payer: Self-pay | Admitting: Oncology

## 2015-06-10 VITALS — BP 164/94 | HR 81 | Temp 97.9°F | Resp 18

## 2015-06-10 DIAGNOSIS — C9000 Multiple myeloma not having achieved remission: Secondary | ICD-10-CM

## 2015-06-10 NOTE — Patient Instructions (Addendum)
[place] at [facility] Discharge Instructions  RECOMMENDATIONS MADE BY THE CONSULTANT AND ANY TEST RESULTS WILL BE SENT TO YOUR REFERRING PHYSICIAN.  Labs in 2 weeks, 4 weeks, and 6 weeks.  Remember, labs will be performed on the first floor in the lab. Return in 6 weeks for follow-up. Please call if you are interested in trying a steroid for pain.  This will be a sort-term medication. We will check on referral to pain clinic.  Thank you for choosing [place] at [facility] to provide your oncology and hematology care.  To afford each patient quality time with our provider, please arrive at least 15 minutes before your scheduled appointment time.   Beginning January 23rd 2017 lab work for the [facility] will be done in the  Main lab at [facility] on 1st floor. If you have a lab appointment with the [place] please come in thru the  Main Entrance and check in at the main information desk  You need to re-schedule your appointment should you arrive 10 or more minutes late.  We strive to give you quality time with our providers, and arriving late affects you and other patients whose appointments are after yours.  Also, if you no show three or more times for appointments you may be dismissed from the clinic at the providers discretion.     Again, thank you for choosing [facility].  Our hope is that these requests will decrease the amount of time that you wait before being seen by our physicians.       _____________________________________________________________  Should you have questions after your visit to [facility], please contact our office at [phone] between the hours of 8:30 a.m. and 4:30 p.m.  Voicemails left after 4:30 p.m. will not be returned until the following business day.  For prescription refill requests, have your pharmacy contact our office.

## 2015-06-12 ENCOUNTER — Other Ambulatory Visit (HOSPITAL_COMMUNITY): Payer: Self-pay | Admitting: Oncology

## 2015-06-14 ENCOUNTER — Ambulatory Visit (HOSPITAL_COMMUNITY)
Admission: RE | Admit: 2015-06-14 | Discharge: 2015-06-14 | Disposition: A | Discharge status: Home / Self Care | Payer: Medicare Other | Source: Ambulatory Visit | Attending: Podiatry | Admitting: Podiatry

## 2015-06-14 ENCOUNTER — Telehealth (HOSPITAL_COMMUNITY): Payer: Self-pay | Admitting: *Deleted

## 2015-06-14 ENCOUNTER — Other Ambulatory Visit (HOSPITAL_COMMUNITY): Payer: Self-pay | Admitting: Oncology

## 2015-06-14 DIAGNOSIS — L819 Disorder of pigmentation, unspecified: Secondary | ICD-10-CM | POA: Diagnosis not present

## 2015-06-14 DIAGNOSIS — N186 End stage renal disease: Secondary | ICD-10-CM | POA: Insufficient documentation

## 2015-06-14 DIAGNOSIS — Z992 Dependence on renal dialysis: Secondary | ICD-10-CM | POA: Insufficient documentation

## 2015-06-14 NOTE — Telephone Encounter (Signed)
This was escribed 2 days ago on 1/25  Doy Mince 06/14/2015 3:33 PM

## 2015-06-19 ENCOUNTER — Other Ambulatory Visit (HOSPITAL_COMMUNITY): Payer: Medicaid Other

## 2015-06-19 ENCOUNTER — Encounter (HOSPITAL_COMMUNITY): Payer: Medicare Other | Attending: Oncology

## 2015-06-19 ENCOUNTER — Other Ambulatory Visit (HOSPITAL_COMMUNITY): Payer: Self-pay | Admitting: Oncology

## 2015-06-19 DIAGNOSIS — C9000 Multiple myeloma not having achieved remission: Secondary | ICD-10-CM | POA: Diagnosis not present

## 2015-06-19 LAB — CBC
HCT: 25.4 % — ABNORMAL LOW (ref 39.0–52.0)
Hemoglobin: 8.4 g/dL — ABNORMAL LOW (ref 13.0–17.0)
MCH: 37.2 pg — AB (ref 26.0–34.0)
MCHC: 33.1 g/dL (ref 30.0–36.0)
MCV: 112.4 fL — ABNORMAL HIGH (ref 78.0–100.0)
PLATELETS: 12 10*3/uL — AB (ref 150–400)
RBC: 2.26 MIL/uL — ABNORMAL LOW (ref 4.22–5.81)
RDW: 22.9 % — AB (ref 11.5–15.5)
WBC: 1.4 10*3/uL — CL (ref 4.0–10.5)

## 2015-06-19 NOTE — Progress Notes (Unsigned)
CRITICAL VALUE ALERT  Critical value received:  WBC 1.4; platelets 12,000  Date of notification:  06/19/15   Time of notification:  1000  Critical value read back:Yes.    Nurse who received alert:  A. Ouida Sills, RN  MD notified:  Caroleen Hamman, PA-C at [phone]

## 2015-06-20 ENCOUNTER — Other Ambulatory Visit (HOSPITAL_COMMUNITY): Payer: Self-pay | Admitting: Oncology

## 2015-06-20 DIAGNOSIS — C9000 Multiple myeloma not having achieved remission: Secondary | ICD-10-CM

## 2015-06-20 MED ORDER — LENALIDOMIDE 5 MG PO CAPS: ORAL_CAPSULE | ORAL | Status: DC

## 2015-06-21 ENCOUNTER — Encounter (HOSPITAL_BASED_OUTPATIENT_CLINIC_OR_DEPARTMENT_OTHER): Payer: Medicare Other

## 2015-06-21 ENCOUNTER — Other Ambulatory Visit (HOSPITAL_COMMUNITY): Payer: Self-pay | Admitting: *Deleted

## 2015-06-21 VITALS — BP 163/96 | HR 77 | Temp 98.0°F | Resp 16

## 2015-06-21 DIAGNOSIS — C9000 Multiple myeloma not having achieved remission: Secondary | ICD-10-CM | POA: Diagnosis not present

## 2015-06-21 MED ORDER — SODIUM CHLORIDE 0.9 % IV SOLN
250.0000 mL | Freq: Once | INTRAVENOUS | Status: AC
  Administered 2015-06-21: 250 mL via INTRAVENOUS

## 2015-06-21 MED ORDER — DIPHENHYDRAMINE HCL 25 MG PO CAPS
25.0000 mg | ORAL_CAPSULE | Freq: Once | ORAL | Status: AC
  Administered 2015-06-21: 25 mg via ORAL

## 2015-06-21 MED ORDER — SODIUM CHLORIDE 0.9% FLUSH
10.0000 mL | INTRAVENOUS | Status: AC | PRN
  Administered 2015-06-21: 10 mL

## 2015-06-21 MED ORDER — HEPARIN SOD (PORK) LOCK FLUSH 100 UNIT/ML IV SOLN
500.0000 [IU] | Freq: Every day | INTRAVENOUS | Status: AC | PRN
  Administered 2015-06-21: 500 [IU]

## 2015-06-21 MED ORDER — DIPHENHYDRAMINE HCL 25 MG PO CAPS
ORAL_CAPSULE | ORAL | Status: AC
  Filled 2015-06-21: qty 1

## 2015-06-21 MED ORDER — ACETAMINOPHEN 325 MG PO TABS
ORAL_TABLET | ORAL | Status: AC
  Filled 2015-06-21: qty 2

## 2015-06-21 MED ORDER — ACETAMINOPHEN 325 MG PO TABS
650.0000 mg | ORAL_TABLET | Freq: Once | ORAL | Status: AC
  Administered 2015-06-21: 650 mg via ORAL

## 2015-06-21 NOTE — Patient Instructions (Signed)
[place] at [facility] Discharge Instructions  RECOMMENDATIONS MADE BY THE CONSULTANT AND ANY TEST RESULTS WILL BE SENT TO YOUR REFERRING PHYSICIAN.  One unit of blood and one unit of platelets today.   Please return to hospital through the ER with any bleeding that can't be stopped.    Thank you for choosing [place] at [facility] to provide your oncology and hematology care.  To afford each patient quality time with our provider, please arrive at least 15 minutes before your scheduled appointment time.   Beginning January 23rd 2017 lab work for the [facility] will be done in the  Main lab at [facility] on 1st floor. If you have a lab appointment with the [place] please come in thru the  Main Entrance and check in at the main information desk  You need to re-schedule your appointment should you arrive 10 or more minutes late.  We strive to give you quality time with our providers, and arriving late affects you and other patients whose appointments are after yours.  Also, if you no show three or more times for appointments you may be dismissed from the clinic at the providers discretion.     Again, thank you for choosing [facility].  Our hope is that these requests will decrease the amount of time that you wait before being seen by our physicians.       _____________________________________________________________  Should you have questions after your visit to [facility], please contact our office at [phone] between the hours of 8:30 a.m. and 4:30 p.m.  Voicemails left after 4:30 p.m. will not be returned until the following business day.  For prescription refill requests, have your pharmacy contact our office.

## 2015-06-21 NOTE — Progress Notes (Signed)
Patient tolerated transfusion well.  VSS throughout.  Port de-accessed and not bleeding, however, when I took the patient to his car it started to bleed.  I returned into the hospital with him and went to the lab to clean his port site, held pressure until bleeding had stopped, and recovered the site with a pressure dressing.  The patient was instructed to leave the dressing for at least a couple of hours and to return to the ER if he had bleeding that he couldn't stop.  He verbalized understanding.

## 2015-06-23 LAB — TYPE AND SCREEN
ABO/RH(D): A POS
ANTIBODY SCREEN: NEGATIVE
Unit division: 0

## 2015-06-23 LAB — PREPARE PLATELET PHERESIS: UNIT DIVISION: 0

## 2015-06-24 ENCOUNTER — Telehealth (HOSPITAL_COMMUNITY): Payer: Self-pay | Admitting: *Deleted

## 2015-06-24 ENCOUNTER — Other Ambulatory Visit (HOSPITAL_COMMUNITY): Payer: Self-pay | Admitting: Oncology

## 2015-06-24 ENCOUNTER — Other Ambulatory Visit (HOSPITAL_COMMUNITY): Payer: Medicare Other

## 2015-06-24 DIAGNOSIS — C9000 Multiple myeloma not having achieved remission: Secondary | ICD-10-CM

## 2015-06-24 MED ORDER — HYDROCODONE-ACETAMINOPHEN 5-325 MG PO TABS: 1.0000 | ORAL_TABLET | Freq: Four times a day (QID) | ORAL | Status: DC | PRN

## 2015-07-03 ENCOUNTER — Other Ambulatory Visit (HOSPITAL_COMMUNITY): Payer: Self-pay | Admitting: *Deleted

## 2015-07-03 ENCOUNTER — Other Ambulatory Visit (HOSPITAL_COMMUNITY): Payer: Medicaid Other

## 2015-07-03 DIAGNOSIS — C9000 Multiple myeloma not having achieved remission: Secondary | ICD-10-CM

## 2015-07-08 ENCOUNTER — Encounter (HOSPITAL_BASED_OUTPATIENT_CLINIC_OR_DEPARTMENT_OTHER): Payer: Medicare Other | Admitting: Oncology

## 2015-07-08 ENCOUNTER — Other Ambulatory Visit (HOSPITAL_COMMUNITY): Payer: Self-pay | Admitting: Oncology

## 2015-07-08 ENCOUNTER — Encounter (HOSPITAL_BASED_OUTPATIENT_CLINIC_OR_DEPARTMENT_OTHER): Payer: Medicare Other

## 2015-07-08 ENCOUNTER — Encounter (HOSPITAL_COMMUNITY): Payer: Self-pay

## 2015-07-08 ENCOUNTER — Encounter (HOSPITAL_COMMUNITY): Payer: Medicare Other

## 2015-07-08 DIAGNOSIS — C9000 Multiple myeloma not having achieved remission: Secondary | ICD-10-CM

## 2015-07-08 LAB — CBC
HCT: 25.8 % — ABNORMAL LOW (ref 39.0–52.0)
Hemoglobin: 8.4 g/dL — ABNORMAL LOW (ref 13.0–17.0)
MCH: 37 pg — AB (ref 26.0–34.0)
MCHC: 32.6 g/dL (ref 30.0–36.0)
MCV: 113.7 fL — AB (ref 78.0–100.0)
PLATELETS: 6 10*3/uL — AB (ref 150–400)
RBC: 2.27 MIL/uL — ABNORMAL LOW (ref 4.22–5.81)
RDW: 21.3 % — AB (ref 11.5–15.5)
WBC: 1.6 10*3/uL — ABNORMAL LOW (ref 4.0–10.5)

## 2015-07-08 LAB — PREPARE RBC (CROSSMATCH)

## 2015-07-08 LAB — SAMPLE TO BLOOD BANK

## 2015-07-08 MED ORDER — HEPARIN SOD (PORK) LOCK FLUSH 100 UNIT/ML IV SOLN
INTRAVENOUS | Status: AC
  Filled 2015-07-08: qty 5

## 2015-07-08 MED ORDER — ACETAMINOPHEN 325 MG PO TABS
650.0000 mg | ORAL_TABLET | Freq: Once | ORAL | Status: AC
  Administered 2015-07-08: 650 mg via ORAL
  Filled 2015-07-08: qty 2

## 2015-07-08 MED ORDER — SODIUM CHLORIDE 0.9% FLUSH: 10.0000 mL | INTRAVENOUS | Status: DC | PRN

## 2015-07-08 MED ORDER — HEPARIN SOD (PORK) LOCK FLUSH 100 UNIT/ML IV SOLN
500.0000 [IU] | Freq: Every day | INTRAVENOUS | Status: AC | PRN
  Administered 2015-07-08: 500 [IU]

## 2015-07-08 MED ORDER — ONDANSETRON HCL 4 MG PO TABS
8.0000 mg | ORAL_TABLET | Freq: Once | ORAL | Status: AC
  Administered 2015-07-08: 8 mg via ORAL
  Filled 2015-07-08: qty 2

## 2015-07-08 MED ORDER — SODIUM CHLORIDE 0.9 % IV SOLN
250.0000 mL | Freq: Once | INTRAVENOUS | Status: AC
  Administered 2015-07-08: 250 mL via INTRAVENOUS

## 2015-07-08 MED ORDER — DIPHENHYDRAMINE HCL 25 MG PO CAPS
25.0000 mg | ORAL_CAPSULE | Freq: Once | ORAL | Status: AC
  Administered 2015-07-08: 25 mg via ORAL
  Filled 2015-07-08: qty 1

## 2015-07-08 NOTE — Progress Notes (Signed)
Tolerated infusion well. Caroleen Hamman PA-c in to talk with patient regarding need for bone marrow biopsy and aspirate in the face of worsening counts.

## 2015-07-08 NOTE — Progress Notes (Signed)
Patient seen as a work-in at his request.  I personally reviewed and went over laboratory results with the patient.  The results are noted within this dictation.    He is receiving platelet transfusion today and 1 unit of PRBCs.  There is not enough time to give him a 2nd unit of blood today.  We will given the 2nd unit on Wednesday.  He has dialysis on Tuesday.  Given his blood trend, he would benefit from a repeat bone marrow aspiration and biopsy.  He is agreeable to this and I will get this set-up with IR in the near future.  Patient and plan discussed with Dr. Ancil Linsey and she is in agreement with the aforementioned.   Robynn Pane, PA-C 07/08/2015 6:10 PM

## 2015-07-09 ENCOUNTER — Other Ambulatory Visit (HOSPITAL_COMMUNITY): Payer: Self-pay | Admitting: Oncology

## 2015-07-09 DIAGNOSIS — D696 Thrombocytopenia, unspecified: Secondary | ICD-10-CM

## 2015-07-09 DIAGNOSIS — C9000 Multiple myeloma not having achieved remission: Secondary | ICD-10-CM

## 2015-07-09 LAB — PREPARE PLATELET PHERESIS: UNIT DIVISION: 0

## 2015-07-10 ENCOUNTER — Encounter (HOSPITAL_BASED_OUTPATIENT_CLINIC_OR_DEPARTMENT_OTHER): Payer: Medicare Other

## 2015-07-10 ENCOUNTER — Other Ambulatory Visit (HOSPITAL_COMMUNITY): Payer: Self-pay | Admitting: Oncology

## 2015-07-10 VITALS — BP 146/90 | HR 78 | Temp 97.9°F | Resp 18

## 2015-07-10 DIAGNOSIS — D696 Thrombocytopenia, unspecified: Secondary | ICD-10-CM

## 2015-07-10 DIAGNOSIS — C9000 Multiple myeloma not having achieved remission: Secondary | ICD-10-CM

## 2015-07-10 DIAGNOSIS — A09 Infectious gastroenteritis and colitis, unspecified: Secondary | ICD-10-CM

## 2015-07-10 DIAGNOSIS — R110 Nausea: Secondary | ICD-10-CM | POA: Diagnosis not present

## 2015-07-10 LAB — COMPREHENSIVE METABOLIC PANEL
ALBUMIN: 3.2 g/dL — AB (ref 3.5–5.0)
ALK PHOS: 33 U/L — AB (ref 38–126)
ALT: 22 U/L (ref 17–63)
ANION GAP: 10 (ref 5–15)
AST: 19 U/L (ref 15–41)
BILIRUBIN TOTAL: 1.2 mg/dL (ref 0.3–1.2)
BUN: 38 mg/dL — AB (ref 6–20)
CO2: 29 mmol/L (ref 22–32)
Calcium: 8.4 mg/dL — ABNORMAL LOW (ref 8.9–10.3)
Chloride: 102 mmol/L (ref 101–111)
Creatinine, Ser: 5.2 mg/dL — ABNORMAL HIGH (ref 0.61–1.24)
GFR calc Af Amer: 12 mL/min — ABNORMAL LOW (ref >60)
GFR calc non Af Amer: 10 mL/min — ABNORMAL LOW (ref >60)
GLUCOSE: 97 mg/dL (ref 65–99)
Potassium: 4.1 mmol/L (ref 3.5–5.1)
SODIUM: 141 mmol/L (ref 135–145)
Total Protein: 6.2 g/dL — ABNORMAL LOW (ref 6.5–8.1)

## 2015-07-10 LAB — CBC
HCT: 26.4 % — ABNORMAL LOW (ref 39.0–52.0)
Hemoglobin: 8.9 g/dL — ABNORMAL LOW (ref 13.0–17.0)
MCH: 36.2 pg — AB (ref 26.0–34.0)
MCHC: 33.6 g/dL (ref 30.0–36.0)
MCV: 107.7 fL — AB (ref 78.0–100.0)
PLATELETS: 18 10*3/uL — AB (ref 150–400)
RBC: 2.46 MIL/uL — AB (ref 4.22–5.81)
RDW: 23.7 % — AB (ref 11.5–15.5)
WBC: 1.2 10*3/uL — CL (ref 4.0–10.5)

## 2015-07-10 MED ORDER — LOPERAMIDE HCL 2 MG PO CAPS
ORAL_CAPSULE | ORAL | Status: AC
  Filled 2015-07-10: qty 2

## 2015-07-10 MED ORDER — ACETAMINOPHEN 325 MG PO TABS
ORAL_TABLET | ORAL | Status: AC
  Filled 2015-07-10: qty 2

## 2015-07-10 MED ORDER — SODIUM CHLORIDE 0.9 % IV SOLN
8.0000 mg | Freq: Once | INTRAVENOUS | Status: AC
  Administered 2015-07-10: 8 mg via INTRAVENOUS
  Filled 2015-07-10: qty 4

## 2015-07-10 MED ORDER — ACETAMINOPHEN 325 MG PO TABS
650.0000 mg | ORAL_TABLET | Freq: Once | ORAL | Status: AC
  Administered 2015-07-10: 650 mg via ORAL

## 2015-07-10 MED ORDER — DIPHENHYDRAMINE HCL 25 MG PO CAPS
25.0000 mg | ORAL_CAPSULE | Freq: Once | ORAL | Status: AC
  Administered 2015-07-10: 25 mg via ORAL

## 2015-07-10 MED ORDER — DIPHENHYDRAMINE HCL 25 MG PO CAPS
ORAL_CAPSULE | ORAL | Status: AC
  Filled 2015-07-10: qty 1

## 2015-07-10 MED ORDER — HEPARIN SOD (PORK) LOCK FLUSH 100 UNIT/ML IV SOLN
500.0000 [IU] | Freq: Every day | INTRAVENOUS | Status: AC | PRN
  Administered 2015-07-10: 500 [IU]

## 2015-07-10 MED ORDER — LOPERAMIDE HCL 2 MG PO CAPS
4.0000 mg | ORAL_CAPSULE | ORAL | Status: DC | PRN
  Administered 2015-07-10: 4 mg via ORAL

## 2015-07-10 MED ORDER — SODIUM CHLORIDE 0.9 % IV SOLN
250.0000 mL | Freq: Once | INTRAVENOUS | Status: AC
  Administered 2015-07-10: 250 mL via INTRAVENOUS

## 2015-07-10 NOTE — Patient Instructions (Signed)
[place] at [facility] Discharge Instructions  RECOMMENDATIONS MADE BY THE CONSULTANT AND ANY TEST RESULTS WILL BE SENT TO YOUR REFERRING PHYSICIAN.  One unit of blood today.   Please return Monday as scheduled for a recheck of your blood counts and we will transfuse if needed.   You were also given Imodium for diarrhea which is over the counter and you can use at home if needed.   We also gave you Zofran for nausea in the IV.    Thank you for choosing [place] at [facility] to provide your oncology and hematology care.  To afford each patient quality time with our provider, please arrive at least 15 minutes before your scheduled appointment time.   Beginning January 23rd 2017 lab work for the [facility] will be done in the  Main lab at [facility] on 1st floor. If you have a lab appointment with the [place] please come in thru the  Main Entrance and check in at the main information desk  You need to re-schedule your appointment should you arrive 10 or more minutes late.  We strive to give you quality time with our providers, and arriving late affects you and other patients whose appointments are after yours.  Also, if you no show three or more times for appointments you may be dismissed from the clinic at the providers discretion.     Again, thank you for choosing [facility].  Our hope is that these requests will decrease the amount of time that you wait before being seen by our physicians.       _____________________________________________________________  Should you have questions after your visit to [facility], please contact our office at [phone] between the hours of 8:30 a.m. and 4:30 p.m.  Voicemails left after 4:30 p.m. will not be returned until the following business day.  For prescription refill requests, have your pharmacy contact our office.

## 2015-07-10 NOTE — Progress Notes (Signed)
CRITICAL VALUE ALERT Critical value received:  WBC 1.2 and platelets 18,000 Date of notification:  07/10/15 Time of notification: [id number] Critical value read back:  Yes.   Nurse who received alert:  T.Polina Burmaster,RN MD notified (1st page):  Verbal to Meriel Flavors at [phone]

## 2015-07-10 NOTE — Progress Notes (Signed)
Patient c/o nausea with 2 episodes of emesis last night.  Order for Zofran IV obtained and given as ordered.  Patient also c/o diarrhea last night.  Order for Imodium obtained and given as well.  Patient reports that nausea was relieved almost completely with zofran.   Patient tolerated transfusion well.  VSS throughout.

## 2015-07-11 LAB — TYPE AND SCREEN
ABO/RH(D): A POS
Antibody Screen: NEGATIVE
UNIT DIVISION: 0
Unit division: 0

## 2015-07-14 ENCOUNTER — Inpatient Hospital Stay (HOSPITAL_COMMUNITY)
Admission: EM | Admit: 2015-07-14 | Discharge: 2015-07-16 | DRG: 391 | Disposition: A | Discharge status: Home / Self Care | Payer: Medicare Other | Attending: Internal Medicine | Admitting: Internal Medicine

## 2015-07-14 ENCOUNTER — Emergency Department (HOSPITAL_COMMUNITY): Payer: Medicare Other

## 2015-07-14 ENCOUNTER — Encounter (HOSPITAL_COMMUNITY): Payer: Self-pay | Admitting: Emergency Medicine

## 2015-07-14 DIAGNOSIS — I509 Heart failure, unspecified: Secondary | ICD-10-CM | POA: Diagnosis present

## 2015-07-14 DIAGNOSIS — C9000 Multiple myeloma not having achieved remission: Secondary | ICD-10-CM | POA: Diagnosis present

## 2015-07-14 DIAGNOSIS — N186 End stage renal disease: Secondary | ICD-10-CM | POA: Diagnosis present

## 2015-07-14 DIAGNOSIS — Z7952 Long term (current) use of systemic steroids: Secondary | ICD-10-CM

## 2015-07-14 DIAGNOSIS — R778 Other specified abnormalities of plasma proteins: Secondary | ICD-10-CM

## 2015-07-14 DIAGNOSIS — D72819 Decreased white blood cell count, unspecified: Secondary | ICD-10-CM

## 2015-07-14 DIAGNOSIS — J90 Pleural effusion, not elsewhere classified: Secondary | ICD-10-CM

## 2015-07-14 DIAGNOSIS — Z79891 Long term (current) use of opiate analgesic: Secondary | ICD-10-CM

## 2015-07-14 DIAGNOSIS — D696 Thrombocytopenia, unspecified: Secondary | ICD-10-CM

## 2015-07-14 DIAGNOSIS — Z85820 Personal history of malignant melanoma of skin: Secondary | ICD-10-CM

## 2015-07-14 DIAGNOSIS — Z992 Dependence on renal dialysis: Secondary | ICD-10-CM

## 2015-07-14 DIAGNOSIS — Z809 Family history of malignant neoplasm, unspecified: Secondary | ICD-10-CM

## 2015-07-14 DIAGNOSIS — Z981 Arthrodesis status: Secondary | ICD-10-CM

## 2015-07-14 DIAGNOSIS — R197 Diarrhea, unspecified: Secondary | ICD-10-CM

## 2015-07-14 DIAGNOSIS — D61818 Other pancytopenia: Secondary | ICD-10-CM | POA: Diagnosis present

## 2015-07-14 DIAGNOSIS — A084 Viral intestinal infection, unspecified: Principal | ICD-10-CM | POA: Diagnosis present

## 2015-07-14 DIAGNOSIS — R112 Nausea with vomiting, unspecified: Secondary | ICD-10-CM | POA: Diagnosis present

## 2015-07-14 DIAGNOSIS — R9431 Abnormal electrocardiogram [ECG] [EKG]: Secondary | ICD-10-CM

## 2015-07-14 DIAGNOSIS — D649 Anemia, unspecified: Secondary | ICD-10-CM

## 2015-07-14 DIAGNOSIS — I10 Essential (primary) hypertension: Secondary | ICD-10-CM | POA: Diagnosis present

## 2015-07-14 DIAGNOSIS — R7989 Other specified abnormal findings of blood chemistry: Secondary | ICD-10-CM

## 2015-07-14 DIAGNOSIS — Z9481 Bone marrow transplant status: Secondary | ICD-10-CM

## 2015-07-14 DIAGNOSIS — I132 Hypertensive heart and chronic kidney disease with heart failure and with stage 5 chronic kidney disease, or end stage renal disease: Secondary | ICD-10-CM | POA: Diagnosis present

## 2015-07-14 LAB — COMPREHENSIVE METABOLIC PANEL
ALBUMIN: 3.4 g/dL — AB (ref 3.5–5.0)
ALK PHOS: 37 U/L — AB (ref 38–126)
ALT: 24 U/L (ref 17–63)
AST: 22 U/L (ref 15–41)
Anion gap: 10 (ref 5–15)
BILIRUBIN TOTAL: 0.8 mg/dL (ref 0.3–1.2)
BUN: 40 mg/dL — AB (ref 6–20)
CALCIUM: 7.7 mg/dL — AB (ref 8.9–10.3)
CO2: 26 mmol/L (ref 22–32)
Chloride: 101 mmol/L (ref 101–111)
Creatinine, Ser: 5.49 mg/dL — ABNORMAL HIGH (ref 0.61–1.24)
GFR calc Af Amer: 11 mL/min — ABNORMAL LOW (ref >60)
GFR calc non Af Amer: 10 mL/min — ABNORMAL LOW (ref >60)
GLUCOSE: 93 mg/dL (ref 65–99)
Potassium: 4.2 mmol/L (ref 3.5–5.1)
Sodium: 137 mmol/L (ref 135–145)
Total Protein: 6.6 g/dL (ref 6.5–8.1)

## 2015-07-14 LAB — CBC WITH DIFFERENTIAL/PLATELET
Basophils Absolute: 0 10*3/uL (ref 0.0–0.1)
Basophils Relative: 0 %
Eosinophils Absolute: 0 10*3/uL (ref 0.0–0.7)
Eosinophils Relative: 2 %
HCT: 30.7 % — ABNORMAL LOW (ref 39.0–52.0)
Hemoglobin: 10.2 g/dL — ABNORMAL LOW (ref 13.0–17.0)
Lymphocytes Relative: 72 %
Lymphs Abs: 1.1 10*3/uL (ref 0.7–4.0)
MCH: 35.4 pg — ABNORMAL HIGH (ref 26.0–34.0)
MCHC: 33.2 g/dL (ref 30.0–36.0)
MCV: 106.6 fL — ABNORMAL HIGH (ref 78.0–100.0)
Monocytes Absolute: 0.1 10*3/uL (ref 0.1–1.0)
Monocytes Relative: 6 %
Neutro Abs: 0.3 10*3/uL — ABNORMAL LOW (ref 1.7–7.7)
Neutrophils Relative %: 20 %
Platelets: 11 10*3/uL — CL (ref 150–400)
RBC: 2.88 MIL/uL — ABNORMAL LOW (ref 4.22–5.81)
RDW: 22.8 % — ABNORMAL HIGH (ref 11.5–15.5)
WBC: 1.6 10*3/uL — ABNORMAL LOW (ref 4.0–10.5)

## 2015-07-14 LAB — LIPASE, BLOOD: Lipase: 30 U/L (ref 11–51)

## 2015-07-14 LAB — LACTIC ACID, PLASMA: Lactic Acid, Venous: 0.7 mmol/L (ref 0.5–2.0)

## 2015-07-14 LAB — TROPONIN I: Troponin I: 0.13 ng/mL — ABNORMAL HIGH (ref <0.031)

## 2015-07-14 MED ORDER — DIATRIZOATE MEGLUMINE & SODIUM 66-10 % PO SOLN
ORAL | Status: AC
  Filled 2015-07-14: qty 30

## 2015-07-14 MED ORDER — SODIUM CHLORIDE 0.9 % IV SOLN
INTRAVENOUS | Status: DC
  Administered 2015-07-14: 22:00:00 via INTRAVENOUS

## 2015-07-14 MED ORDER — PROMETHAZINE HCL 25 MG/ML IJ SOLN
12.5000 mg | Freq: Once | INTRAMUSCULAR | Status: AC | PRN
  Administered 2015-07-15: 12.5 mg via INTRAVENOUS
  Filled 2015-07-14: qty 1

## 2015-07-14 NOTE — ED Notes (Signed)
Pt states he started with diarrhea "a couple of days ago" and then today he states that he has vomited twice.

## 2015-07-14 NOTE — ED Notes (Signed)
Pt is currently getting dialysis, last session was Saturday. States he began having N/V/D "a few days ago", denies abd pain or D/ today. States V/ X2 today after eating.

## 2015-07-14 NOTE — ED Provider Notes (Signed)
CSN: [id number]     Arrival date & time 07/14/15  1937 History   First MD Initiated Contact with Patient 07/14/15 2039     Chief Complaint  Patient presents with  . Emesis     HPI Pt was seen at 2050. Per pt, c/o gradual onset and persistence of multiple intermittent episodes of N/V/D that began 4 to 5 days ago. Describes the stools as "watery." Pt states his symptoms were improving, "then got worse again." Denies abd pain, no CP/SOB, no back pain, no fevers, no black or blood in stools or emesis. LD HD yesterday.     Past Medical History  Diagnosis Date  . Hypertension   . Ruptured cervical disc   . Fall resulting in striking against other object     paralyzed  . Multiple myeloma   . Recurrent multiple myeloma of bone marrow with unknown EBV status ([place])   . Multiple myeloma ([place]) 01/26/2011  . Unspecified vitamin D deficiency 03/06/2013  . Anemia of chronic disease 12/30/2013  . Constipation   . Dialysis patient ([place]) 2016  . Thrombocytopenia ([place]) 08/31/2014  . 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 Constipation    Past Surgical History  Procedure Laterality Date  . Inguinal hernia repair Bilateral   . Anterior cervical decomp/discectomy fusion    . Bone marrow aspirate and biopsy wiith lumbar puncture    . Melanoma excision      rt. breast  . Cervical spine surgery    . Kyphoplasty Bilateral 01/30/2014    Procedure: Thoracic eleven Kyphoplasty;  Surgeon: Consuella Lose, MD;  Location: [facility];  Service: Neurosurgery;  Laterality: Bilateral;  Thoracic eleven Kyphoplasty  . Av fistula placement Right 04/30/2014    Procedure: BRACHIOCEPHALIC ARTERIOVENOUS (AV) FISTULA CREATION;  Surgeon: Conrad [place], MD;  Location: [place];  Service: Vascular;  Laterality: Right;  . Portacath placement Left 02/18/2015    Procedure: INSERTION OF PORT A CATH RIGHT INTERNAL JUGULAR  VEIN ;  Surgeon: Conrad [place], MD;  Location: [place];  Service: Vascular;  Laterality: Left;  . Cataract extraction w/phaco Right 04/15/2015    Procedure: CATARACT EXTRACTION PHACO AND INTRAOCULAR LENS PLACEMENT (IOC);  Surgeon: Tonny Branch, MD;  Location: [facility];  Service: Ophthalmology;  Laterality: Right;  CDE:8.97  . Cataract extraction w/phaco Left 04/29/2015    Procedure: CATARACT EXTRACTION PHACO AND INTRAOCULAR LENS PLACEMENT ; CDE:  7.61;  Surgeon: Tonny Branch, MD;  Location: [facility];  Service: Ophthalmology;  Laterality: Left;   Family History  Problem Relation Age of Onset  . Cancer Sister   . Cancer Brother    Social History  Substance Use Topics  . Smoking status: Never Smoker   . Smokeless tobacco: Never Used  . Alcohol Use: No    Review of Systems ROS: Statement: All systems negative except as marked or noted in the HPI; Constitutional: Negative for fever and chills. ; ; Eyes: Negative for eye pain, redness and discharge. ; ; ENMT: Negative for ear pain, hoarseness, nasal congestion, sinus pressure and sore throat. ; ; Cardiovascular: Negative for chest pain, palpitations, diaphoresis, dyspnea and peripheral edema. ; ; Respiratory: Negative for cough, wheezing and stridor. ; ; Gastrointestinal: +N/V/D. Negative for abdominal pain, blood in stool, hematemesis, jaundice and rectal bleeding. . ; ; Genitourinary: Negative for dysuria, flank pain and hematuria. ; ; Musculoskeletal: Negative for back  pain and neck pain. Negative for swelling and trauma.; ; Skin: Negative for pruritus, rash, abrasions, blisters, bruising and skin lesion.; ; Neuro: Negative for headache, lightheadedness and neck stiffness. Negative for weakness, altered level of consciousness , altered mental status, extremity weakness, paresthesias, involuntary movement, seizure and syncope.      Allergies  Pamidronate  Home Medications   Prior to Admission medications   Medication Sig Start Date End Date Taking?  Authorizing Provider  albuterol (PROVENTIL) (2.5 MG/3ML) 0.083% nebulizer solution Take 3 mLs (2.5 mg total) by nebulization every 6 (six) hours as needed for wheezing or shortness of breath. Patient not taking: Reported on 04/08/2015 09/28/14   Baird Cancer, PA-C  calcium acetate (PHOSLO) 667 MG capsule Take 667 mg by mouth 3 (three) times daily with meals. Take 3 capsules with each meal and 1 capsule with each snack    Historical Provider, MD  chlorpheniramine-HYDROcodone (TUSSIONEX PENNKINETIC ER) 10-8 MG/5ML SUER Take 5 mLs by mouth every 12 (twelve) hours as needed for cough. 04/29/15   Tammy Triplett, PA-C  cinacalcet (SENSIPAR) 30 MG tablet Take 30 mg by mouth daily.    Historical Provider, MD  docusate sodium (COLACE) 100 MG capsule Take 100 mg by mouth daily.     Historical Provider, MD  DUREZOL 0.05 % EMUL Apply 1 drop to eye 3 (three) times daily. 04/22/15   Historical Provider, MD  HYDROcodone-acetaminophen (NORCO/VICODIN) 5-325 MG tablet Take 1 tablet by mouth every 6 (six) hours as needed for severe pain. 06/24/15   Baird Cancer, PA-C  lenalidomide (REVLIMID) 5 MG capsule Take one tablet every other day, after dialysis 06/20/15   Baird Cancer, PA-C  lisinopril (PRINIVIL,ZESTRIL) 20 MG tablet Take 20 mg by mouth every morning. 06/27/15   Historical Provider, MD  LYRICA 75 MG capsule TAKE 1 CAPSULE BY MOUTH TWICE DAILY. 06/12/15   Baird Cancer, PA-C  omega-3 acid ethyl esters (LOVAZA) 1 g capsule  06/28/15   Historical Provider, MD  Omega-3 Fatty Acids (FISH OIL) 1000 MG CAPS Take 2 capsules by mouth daily.    Historical Provider, MD  ondansetron (ZOFRAN) 8 MG tablet Take 1 tablet (8 mg total) by mouth every 8 (eight) hours as needed for nausea or vomiting. 03/15/15   Baird Cancer, PA-C  oxyCODONE-acetaminophen (ROXICET) 5-325 MG tablet Take 1-2 tablets by mouth every 6 (six) hours as needed for moderate pain. Patient not taking: Reported on 04/24/2015 02/18/15   Conrad Anchor, MD   predniSONE (DELTASONE) 20 MG tablet Take 2 tablets (40 mg total) by mouth daily. 04/25/15   Davonna Belling, MD  PROLENSA 0.07 % SOLN Apply 1 drop to eye daily. 04/02/15   Historical Provider, MD  senna-docusate (SENOKOT-S) 8.6-50 MG tablet Take by mouth.    Historical Provider, MD  torsemide (DEMADEX) 20 MG tablet Take 40 mg by mouth daily.    Historical Provider, MD   BP 162/95 mmHg  Pulse 81  Temp(Src) 98.5 F (36.9 C) (Temporal)  Resp 18  Ht 5' 11"  (1.803 m)  Wt 238 lb (107.956 kg)  BMI 33.21 kg/m2  SpO2 99% Physical Exam  2055: Physical examination:  Nursing notes reviewed; Vital signs and O2 SAT reviewed;  Constitutional: Well developed, Well nourished, In no acute distress; Head:  Normocephalic, atraumatic; Eyes: EOMI, PERRL, No scleral icterus; ENMT: Mouth and pharynx normal, Mucous membranes  dry; Neck: Supple, Full range of motion, No lymphadenopathy; Cardiovascular: Regular rate and rhythm, No gallop; Respiratory: Breath sounds clear &  equal bilaterally, No wheezes.  Speaking full sentences with ease, Normal respiratory effort/excursion; Chest: Nontender, Movement normal; Abdomen: +mild diffuse tenderness, +softly distended and tympanitic. Hyperactive bowel sounds; Genitourinary: No CVA tenderness; Extremities: Pulses normal, No tenderness, No edema, No calf edema or asymmetry.; Neuro: AA&Ox3, Major CN grossly intact.  Speech clear. No gross focal motor or sensory deficits in extremities.; Skin: Color normal, Warm, Dry.   ED Course  Procedures (including critical care time) Labs Review   Imaging Review  I have personally reviewed and evaluated these images and lab results as part of my medical decision-making.   EKG Interpretation   Date/Time:  Sunday July 14 2015 21:06:12 EST Ventricular Rate:  80 PR Interval:    QRS Duration: 111 QT Interval:  417 QTC Calculation: 481 R Axis:   -33 Text Interpretation:  Atrial flutter with predominant 3:1 AV block Left  axis  deviation Repol abnrm Baseline wander SUGGEST REPEAT TRACING  Confirmed by [facility]  MD, Nunzio Cory [phone]) on 07/14/2015 9:28:26 PM      EKG Interpretation  Date/Time:  Sunday July 14 2015 21:09:43 EST Ventricular Rate:  79 PR Interval:  181 QRS Duration: 114 QT Interval:  438 QTC Calculation: 502 R Axis:   -34 Text Interpretation:  Sinus or ectopic atrial rhythm LVH with IVCD and secondary repol abnrm Repol abnrm Diffuse Prolonged QT interval When compared with ECG of 02/23/2015 Nonspecific ST and T wave abnormality is now  Present Confirmed by [facility]  MD, Nunzio Cory [id number]) on 07/14/2015 9:31:50 PM        MDM  MDM Reviewed: previous chart, nursing note and vitals Reviewed previous: ECG and labs Interpretation: labs, ECG, x-ray and CT scan     Results for orders placed or performed during the hospital encounter of 07/14/15  Comprehensive metabolic panel  Result Value Ref Range   Sodium 137 135 - 145 mmol/L   Potassium 4.2 3.5 - 5.1 mmol/L   Chloride 101 101 - 111 mmol/L   CO2 26 22 - 32 mmol/L   Glucose, Bld 93 65 - 99 mg/dL   BUN 40 (H) 6 - 20 mg/dL   Creatinine, Ser 5.49 (H) 0.61 - 1.24 mg/dL   Calcium 7.7 (L) 8.9 - 10.3 mg/dL   Total Protein 6.6 6.5 - 8.1 g/dL   Albumin 3.4 (L) 3.5 - 5.0 g/dL   AST 22 15 - 41 U/L   ALT 24 17 - 63 U/L   Alkaline Phosphatase 37 (L) 38 - 126 U/L   Total Bilirubin 0.8 0.3 - 1.2 mg/dL   GFR calc non Af Amer 10 (L) >60 mL/min   GFR calc Af Amer 11 (L) >60 mL/min   Anion gap 10 5 - 15  Lactic acid, plasma  Result Value Ref Range   Lactic Acid, Venous 0.7 0.5 - 2.0 mmol/L  Troponin I  Result Value Ref Range   Troponin I 0.13 (H) <0.031 ng/mL  Lipase, blood  Result Value Ref Range   Lipase 30 11 - 51 U/L  CBC with Differential  Result Value Ref Range   WBC 1.6 (L) 4.0 - 10.5 K/uL   RBC 2.88 (L) 4.22 - 5.81 MIL/uL   Hemoglobin 10.2 (L) 13.0 - 17.0 g/dL   HCT 30.7 (L) 39.0 - 52.0 %   MCV 106.6 (H) 78.0 - 100.0 fL   MCH 35.4 (H)  26.0 - 34.0 pg   MCHC 33.2 30.0 - 36.0 g/dL   RDW 22.8 (H) 11.5 - 15.5 %   Platelets  11 (LL) 150 - 400 K/uL   Neutrophils Relative % 20 %   Neutro Abs 0.3 (L) 1.7 - 7.7 K/uL   Lymphocytes Relative 72 %   Lymphs Abs 1.1 0.7 - 4.0 K/uL   Monocytes Relative 6 %   Monocytes Absolute 0.1 0.1 - 1.0 K/uL   Eosinophils Relative 2 %   Eosinophils Absolute 0.0 0.0 - 0.7 K/uL   Basophils Relative 0 %   Basophils Absolute 0.0 0.0 - 0.1 K/uL   Ct Abdomen Pelvis Wo Contrast 07/15/2015  CLINICAL DATA:  Acute onset of nausea vomiting and diarrhea. Sharp stabbing right upper quadrant abdominal pain. Initial encounter. EXAM: CT ABDOMEN AND PELVIS WITHOUT CONTRAST TECHNIQUE: Multidetector CT imaging of the abdomen and pelvis was performed following the standard protocol without IV contrast. COMPARISON:  PET/CT performed 04/04/2010 FINDINGS: A small right pleural effusion is noted, with associated atelectasis. Minimal interstitial prominence is noted at the lung bases. Diffuse coronary artery calcifications are seen. The liver and spleen are unremarkable in appearance. There is minimal haziness about the gallbladder, of uncertain significance. The gallbladder is otherwise grossly unremarkable on CT. The pancreas and adrenal glands are unremarkable. Severe chronic bilateral renal atrophy is noted, with a few bilateral renal cysts and minimal nonobstructing stones. Nonspecific perinephric stranding is noted bilaterally. There is no evidence of hydronephrosis. No obstructing ureteral stones are identified. A focus of calcification within the mid abdominal mesentery is likely benign. No free fluid is identified. The small bowel is unremarkable in appearance. The stomach is within normal limits. No acute vascular abnormalities are seen. The appendix is normal in caliber, without evidence of appendicitis. The colon is unremarkable in appearance. The bladder is mildly distended and grossly unremarkable. The prostate remains  normal in size, with scattered calcification. No inguinal lymphadenopathy is seen. No acute osseous abnormalities are identified. The patient is status post vertebroplasty at T11. Disc space narrowing is noted at L4-L5. IMPRESSION: 1. Minimal haziness about the gallbladder, of uncertain significance. It is otherwise unremarkable in appearance on CT. If the patient's symptoms persist, right upper quadrant ultrasound could be considered for further evaluation. 2. Small right pleural effusion, with associated atelectasis. Minimal interstitial prominence at the lung bases. 3. Diffuse coronary artery calcifications seen. 4. Severe chronic bilateral renal atrophy, with a few bilateral renal cysts and minimal nonobstructing stones. Electronically Signed   By: Garald Balding M.D.   On: 07/15/2015 00:24    Results for [name], [name] (MRN [id number]) as of 07/15/2015 00:37  Ref. Range 06/04/2015 15:00 06/19/2015 09:33 07/08/2015 09:29 07/10/2015 09:30 07/14/2015 21:33  WBC Latest Ref Range: 4.0-10.5 K/uL 2.1 (L) 1.4 (LL) 1.6 (L) 1.2 (LL) 1.6 (L)  Hemoglobin Latest Ref Range: 13.0-17.0 g/dL 8.0 (L) 8.4 (L) 8.4 (L) 8.9 (L) 10.2 (L)  HCT Latest Ref Range: 39.0-52.0 % 24.2 (L) 25.4 (L) 25.8 (L) 26.4 (L) 30.7 (L)  Platelets Latest Ref Range: 150-400 K/uL 13 (LL) 12 (LL) 6 (LL) 18 (LL) 11 (LL)    1240:  Pt with one episode of diarrhea while in CT, but none since. GI pathogen panel pending. No vomiting while in the ED.  Troponin elevated from his usual baseline mild elevation, and EKG with new STTW changes. Repeat troponin pending. Pt continues to deny CP now or at any time recently. Dx and testing d/w pt.  Questions answered.  Verb understanding, agreeable to admit. T/C to [facility]. Arnoldo Morale, case discussed, including:  HPI, pertinent PM/SHx, VS/PE, dx testing, ED course and treatment:  Agreeable to admit, requests to write temporary orders, obtain tele bed to team [facility]mits.   Francine Graven, DO 07/17/15 2103

## 2015-07-14 NOTE — ED Notes (Addendum)
CRITICAL VALUE ALERT  Critical value received:  Platelets 11,000  Date of notification:  07/14/2015  Time of notification:  22:42  Critical value read back: yes  Nurse who received alert:  Tilden Fossa RN  MD notified (1st page):  Thurnell Garbe  Time of first page:  22:44  MD notified (2nd page):  Time of second page:  Responding MD:  Thurnell Garbe  Time MD responded:  22:44

## 2015-07-15 ENCOUNTER — Encounter (HOSPITAL_COMMUNITY): Payer: Self-pay | Admitting: *Deleted

## 2015-07-15 ENCOUNTER — Inpatient Hospital Stay (HOSPITAL_COMMUNITY): Payer: Medicare Other

## 2015-07-15 ENCOUNTER — Encounter (HOSPITAL_COMMUNITY): Payer: Medicaid Other

## 2015-07-15 ENCOUNTER — Other Ambulatory Visit (HOSPITAL_COMMUNITY): Payer: Medicare Other

## 2015-07-15 DIAGNOSIS — D61818 Other pancytopenia: Secondary | ICD-10-CM | POA: Diagnosis present

## 2015-07-15 DIAGNOSIS — C9000 Multiple myeloma not having achieved remission: Secondary | ICD-10-CM

## 2015-07-15 DIAGNOSIS — I509 Heart failure, unspecified: Secondary | ICD-10-CM

## 2015-07-15 DIAGNOSIS — A084 Viral intestinal infection, unspecified: Secondary | ICD-10-CM | POA: Diagnosis present

## 2015-07-15 DIAGNOSIS — J948 Other specified pleural conditions: Secondary | ICD-10-CM

## 2015-07-15 DIAGNOSIS — N186 End stage renal disease: Secondary | ICD-10-CM | POA: Insufficient documentation

## 2015-07-15 DIAGNOSIS — R7989 Other specified abnormal findings of blood chemistry: Secondary | ICD-10-CM

## 2015-07-15 DIAGNOSIS — Z992 Dependence on renal dialysis: Secondary | ICD-10-CM | POA: Diagnosis not present

## 2015-07-15 DIAGNOSIS — R112 Nausea with vomiting, unspecified: Secondary | ICD-10-CM | POA: Diagnosis not present

## 2015-07-15 DIAGNOSIS — J90 Pleural effusion, not elsewhere classified: Secondary | ICD-10-CM | POA: Insufficient documentation

## 2015-07-15 DIAGNOSIS — D72819 Decreased white blood cell count, unspecified: Secondary | ICD-10-CM | POA: Diagnosis present

## 2015-07-15 DIAGNOSIS — Z981 Arthrodesis status: Secondary | ICD-10-CM | POA: Diagnosis not present

## 2015-07-15 DIAGNOSIS — R9431 Abnormal electrocardiogram [ECG] [EKG]: Secondary | ICD-10-CM | POA: Insufficient documentation

## 2015-07-15 DIAGNOSIS — Z9481 Bone marrow transplant status: Secondary | ICD-10-CM | POA: Diagnosis not present

## 2015-07-15 DIAGNOSIS — I10 Essential (primary) hypertension: Secondary | ICD-10-CM

## 2015-07-15 DIAGNOSIS — Z79891 Long term (current) use of opiate analgesic: Secondary | ICD-10-CM | POA: Diagnosis not present

## 2015-07-15 DIAGNOSIS — R778 Other specified abnormalities of plasma proteins: Secondary | ICD-10-CM | POA: Diagnosis present

## 2015-07-15 DIAGNOSIS — Z85820 Personal history of malignant melanoma of skin: Secondary | ICD-10-CM | POA: Diagnosis not present

## 2015-07-15 DIAGNOSIS — R197 Diarrhea, unspecified: Secondary | ICD-10-CM | POA: Diagnosis not present

## 2015-07-15 DIAGNOSIS — Z7952 Long term (current) use of systemic steroids: Secondary | ICD-10-CM | POA: Diagnosis not present

## 2015-07-15 DIAGNOSIS — Z809 Family history of malignant neoplasm, unspecified: Secondary | ICD-10-CM | POA: Diagnosis not present

## 2015-07-15 DIAGNOSIS — I132 Hypertensive heart and chronic kidney disease with heart failure and with stage 5 chronic kidney disease, or end stage renal disease: Secondary | ICD-10-CM | POA: Diagnosis present

## 2015-07-15 LAB — BASIC METABOLIC PANEL
Anion gap: 13 (ref 5–15)
BUN: 43 mg/dL — AB (ref 6–20)
CO2: 21 mmol/L — AB (ref 22–32)
Calcium: 7.1 mg/dL — ABNORMAL LOW (ref 8.9–10.3)
Chloride: 106 mmol/L (ref 101–111)
Creatinine, Ser: 5.84 mg/dL — ABNORMAL HIGH (ref 0.61–1.24)
GFR calc Af Amer: 10 mL/min — ABNORMAL LOW (ref >60)
GFR, EST NON AFRICAN AMERICAN: 9 mL/min — AB (ref >60)
GLUCOSE: 82 mg/dL (ref 65–99)
POTASSIUM: 3.9 mmol/L (ref 3.5–5.1)
Sodium: 140 mmol/L (ref 135–145)

## 2015-07-15 LAB — RENAL FUNCTION PANEL
Albumin: 3.1 g/dL — ABNORMAL LOW (ref 3.5–5.0)
Anion gap: 10 (ref 5–15)
BUN: 49 mg/dL — AB (ref 6–20)
CHLORIDE: 105 mmol/L (ref 101–111)
CO2: 26 mmol/L (ref 22–32)
Calcium: 7.2 mg/dL — ABNORMAL LOW (ref 8.9–10.3)
Creatinine, Ser: 6.73 mg/dL — ABNORMAL HIGH (ref 0.61–1.24)
GFR calc Af Amer: 9 mL/min — ABNORMAL LOW (ref >60)
GFR calc non Af Amer: 8 mL/min — ABNORMAL LOW (ref >60)
GLUCOSE: 87 mg/dL (ref 65–99)
POTASSIUM: 4.7 mmol/L (ref 3.5–5.1)
Phosphorus: 3.8 mg/dL (ref 2.5–4.6)
Sodium: 141 mmol/L (ref 135–145)

## 2015-07-15 LAB — CBC
HEMATOCRIT: 28.8 % — AB (ref 39.0–52.0)
HEMATOCRIT: 28.9 % — AB (ref 39.0–52.0)
Hemoglobin: 9.6 g/dL — ABNORMAL LOW (ref 13.0–17.0)
Hemoglobin: 9.3 g/dL — ABNORMAL LOW (ref 13.0–17.0)
MCH: 35.8 pg — AB (ref 26.0–34.0)
MCH: 35 pg — ABNORMAL HIGH (ref 26.0–34.0)
MCHC: 33.3 g/dL (ref 30.0–36.0)
MCHC: 32.2 g/dL (ref 30.0–36.0)
MCV: 107.5 fL — AB (ref 78.0–100.0)
MCV: 108.6 fL — AB (ref 78.0–100.0)
Platelets: 9 10*3/uL — CL (ref 150–400)
Platelets: 6 10*3/uL — CL (ref 150–400)
RBC: 2.68 MIL/uL — ABNORMAL LOW (ref 4.22–5.81)
RBC: 2.66 MIL/uL — ABNORMAL LOW (ref 4.22–5.81)
RDW: 22.9 % — AB (ref 11.5–15.5)
RDW: 22.8 % — AB (ref 11.5–15.5)
WBC: 1.3 10*3/uL — CL (ref 4.0–10.5)
WBC: 1.2 10*3/uL — CL (ref 4.0–10.5)

## 2015-07-15 LAB — TROPONIN I
TROPONIN I: 0.11 ng/mL — AB (ref <0.031)
TROPONIN I: 0.12 ng/mL — AB (ref <0.031)
Troponin I: 0.15 ng/mL — ABNORMAL HIGH (ref <0.031)
Troponin I: 0.12 ng/mL — ABNORMAL HIGH (ref <0.031)

## 2015-07-15 LAB — LACTIC ACID, PLASMA: Lactic Acid, Venous: 0.8 mmol/L (ref 0.5–2.0)

## 2015-07-15 LAB — MRSA PCR SCREENING: MRSA BY PCR: NEGATIVE

## 2015-07-15 MED ORDER — SODIUM CHLORIDE 0.9 % IV SOLN: 100.0000 mL | INTRAVENOUS | Status: DC | PRN

## 2015-07-15 MED ORDER — DIPHENHYDRAMINE HCL 25 MG PO CAPS
25.0000 mg | ORAL_CAPSULE | Freq: Once | ORAL | Status: AC
  Administered 2015-07-15: 25 mg via ORAL
  Filled 2015-07-15: qty 1

## 2015-07-15 MED ORDER — ACETAMINOPHEN 325 MG PO TABS
650.0000 mg | ORAL_TABLET | Freq: Once | ORAL | Status: AC
  Administered 2015-07-15: 650 mg via ORAL
  Filled 2015-07-15: qty 2

## 2015-07-15 MED ORDER — ONDANSETRON HCL 4 MG PO TABS: 4.0000 mg | ORAL_TABLET | Freq: Four times a day (QID) | ORAL | Status: DC | PRN

## 2015-07-15 MED ORDER — SODIUM CHLORIDE 0.9 % IV SOLN: 250.0000 mL | INTRAVENOUS | Status: DC | PRN

## 2015-07-15 MED ORDER — LIDOCAINE HCL (PF) 1 % IJ SOLN: 5.0000 mL | INTRAMUSCULAR | Status: DC | PRN

## 2015-07-15 MED ORDER — ACETAMINOPHEN 650 MG RE SUPP: 650.0000 mg | Freq: Four times a day (QID) | RECTAL | Status: DC | PRN

## 2015-07-15 MED ORDER — ONDANSETRON HCL 4 MG/2ML IJ SOLN
4.0000 mg | Freq: Four times a day (QID) | INTRAMUSCULAR | Status: DC | PRN
  Administered 2015-07-15: 4 mg via INTRAVENOUS
  Filled 2015-07-15: qty 2

## 2015-07-15 MED ORDER — ACETAMINOPHEN 325 MG PO TABS: 650.0000 mg | ORAL_TABLET | Freq: Four times a day (QID) | ORAL | Status: DC | PRN

## 2015-07-15 MED ORDER — OXYCODONE HCL 5 MG PO TABS: 5.0000 mg | ORAL_TABLET | ORAL | Status: DC | PRN

## 2015-07-15 MED ORDER — HYDROMORPHONE HCL 1 MG/ML IJ SOLN: 0.5000 mg | INTRAMUSCULAR | Status: DC | PRN

## 2015-07-15 MED ORDER — PREGABALIN 75 MG PO CAPS
75.0000 mg | ORAL_CAPSULE | Freq: Two times a day (BID) | ORAL | Status: DC
  Administered 2015-07-15 (×2): 75 mg via ORAL
  Filled 2015-07-15 (×2): qty 1

## 2015-07-15 MED ORDER — SODIUM CHLORIDE 0.9% FLUSH: 3.0000 mL | INTRAVENOUS | Status: DC | PRN

## 2015-07-15 MED ORDER — LIDOCAINE-PRILOCAINE 2.5-2.5 % EX CREA
1.0000 "application " | TOPICAL_CREAM | CUTANEOUS | Status: DC | PRN
  Filled 2015-07-15: qty 5

## 2015-07-15 MED ORDER — SODIUM CHLORIDE 0.9 % IV SOLN
Freq: Once | INTRAVENOUS | Status: AC
  Administered 2015-07-15: 20:00:00 via INTRAVENOUS

## 2015-07-15 MED ORDER — ALTEPLASE 2 MG IJ SOLR
2.0000 mg | Freq: Once | INTRAMUSCULAR | Status: DC | PRN
  Filled 2015-07-15: qty 2

## 2015-07-15 MED ORDER — SODIUM CHLORIDE 0.9% FLUSH
3.0000 mL | Freq: Two times a day (BID) | INTRAVENOUS | Status: DC
  Administered 2015-07-15: 3 mL via INTRAVENOUS

## 2015-07-15 MED ORDER — SODIUM CHLORIDE 0.9 % IV SOLN
INTRAVENOUS | Status: DC
  Administered 2015-07-15: 03:00:00 via INTRAVENOUS

## 2015-07-15 MED ORDER — LISINOPRIL 10 MG PO TABS
40.0000 mg | ORAL_TABLET | Freq: Every morning | ORAL | Status: DC
Start: 2015-07-15 — End: 2015-07-16
  Administered 2015-07-15: 40 mg via ORAL
  Filled 2015-07-15 (×2): qty 4

## 2015-07-15 MED ORDER — PENTAFLUOROPROP-TETRAFLUOROETH EX AERO
1.0000 "application " | INHALATION_SPRAY | CUTANEOUS | Status: DC | PRN
  Filled 2015-07-15: qty 30

## 2015-07-15 MED ORDER — HEPARIN SODIUM (PORCINE) 1000 UNIT/ML DIALYSIS
1000.0000 [IU] | INTRAMUSCULAR | Status: DC | PRN
  Filled 2015-07-15: qty 1

## 2015-07-15 MED ORDER — OMEGA-3-ACID ETHYL ESTERS 1 G PO CAPS
1.0000 g | ORAL_CAPSULE | Freq: Every day | ORAL | Status: DC
  Administered 2015-07-15: 1 g via ORAL
  Filled 2015-07-15 (×2): qty 1

## 2015-07-15 NOTE — Progress Notes (Signed)
Critical wbc of 1.2 and platelets 6 [facility] notified  Current order to receive a unit of platelets.

## 2015-07-15 NOTE — H&P (Signed)
[facility] Admission History and Physical       DONTAI PEMBER ZOX:[id number] DOB: [birth year] DOA: 07/14/2015  Referring physician:  PCP: No PCP Per Patient  Specialists:   Chief Complaint: Nausea and Vomiting and Diarrhea  HPI: [name] is a 70 y.o. male with a history of Multiple Myeloma, ESRD on HD ( Tues, Thurs, Sat), and HTN who presents to the ED with complaints of Nausea Vomiting and Diarrhea x 5 days.   He denies any fevers or chills and denies any sick contacts.   He was evaluated in the ED and a C.diff Workup was initiated, and he was referred for admission.     Review of Systems:  Constitutional: No Weight Loss, No Weight Gain, Night Sweats, Fevers, Chills, Dizziness, Light Headedness, Fatigue, +Generalized Weakness HEENT: No Headaches, Difficulty Swallowing,Tooth/Dental Problems,Sore Throat,  No Sneezing, Rhinitis, Ear Ache, Nasal Congestion, or Post Nasal Drip,  Cardio-vascular:  No Chest pain, Orthopnea, PND, Edema in Lower Extremities, Anasarca, Dizziness, Palpitations  Resp: No Dyspnea, No DOE, No Productive Cough, No Non-Productive Cough, No Hemoptysis, No Wheezing.    GI: No Heartburn, Indigestion, Abdominal Pain, +Nausea, +Vomiting, +Diarrhea, Constipation, Hematemesis, Hematochezia, Melena, Change in Bowel Habits,  Loss of Appetite  GU: No Dysuria, No Change in Color of Urine, No Urgency or Urinary Frequency, No Flank pain.  Musculoskeletal: No Joint Pain or Swelling, No Decreased Range of Motion, No Back Pain.  Neurologic: No Syncope, No Seizures, Muscle Weakness, Paresthesia, Vision Disturbance or Loss, No Diplopia, No Vertigo, No Difficulty Walking,  Skin: No Rash or Lesions. Psych: No Change in Mood or Affect, No Depression or Anxiety, No Memory loss, No Confusion, or Hallucinations   Past Medical History  Diagnosis Date  . Hypertension   . Ruptured cervical disc   . Fall resulting in striking against other object     paralyzed  . Multiple  myeloma   . Recurrent multiple myeloma of bone marrow with unknown EBV status ([place])   . Multiple myeloma ([place]) 01/26/2011  . Unspecified vitamin D deficiency 03/06/2013  . Anemia of chronic disease 12/30/2013  . Constipation   . Dialysis patient ([place]) 2016  . Thrombocytopenia ([place]) 08/31/2014  . 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 Constipation      Past Surgical History  Procedure Laterality Date  . Inguinal hernia repair Bilateral   . Anterior cervical decomp/discectomy fusion    . Bone marrow aspirate and biopsy wiith lumbar puncture    . Melanoma excision      rt. breast  . Cervical spine surgery    . Kyphoplasty Bilateral 01/30/2014    Procedure: Thoracic eleven Kyphoplasty;  Surgeon: Consuella Lose, MD;  Location: [facility];  Service: Neurosurgery;  Laterality: Bilateral;  Thoracic eleven Kyphoplasty  . Av fistula placement Right 04/30/2014    Procedure: BRACHIOCEPHALIC ARTERIOVENOUS (AV) FISTULA CREATION;  Surgeon: Conrad [place], MD;  Location: [place];  Service: Vascular;  Laterality: Right;  . Portacath placement Left 02/18/2015    Procedure: INSERTION OF PORT A CATH RIGHT INTERNAL JUGULAR VEIN ;  Surgeon: Conrad Hartford, MD;  Location: [place];  Service: Vascular;  Laterality: Left;  . Cataract extraction w/phaco Right 04/15/2015    Procedure: CATARACT EXTRACTION PHACO AND INTRAOCULAR LENS PLACEMENT (IOC);  Surgeon: Tonny Branch, MD;  Location: [facility];  Service: Ophthalmology;  Laterality: Right;  CDE:8.97  .  Cataract extraction w/phaco Left 04/29/2015    Procedure: CATARACT EXTRACTION PHACO AND INTRAOCULAR LENS PLACEMENT ; CDE:  7.61;  Surgeon: Tonny Branch, MD;  Location: [facility];  Service: Ophthalmology;  Laterality: Left;      Prior to Admission medications   Medication Sig Start Date End Date Taking? Authorizing Provider  lisinopril (PRINIVIL,ZESTRIL) 20 MG tablet  Take 40 mg by mouth every morning.  06/27/15  Yes Historical Provider, MD  LYRICA 75 MG capsule TAKE 1 CAPSULE BY MOUTH TWICE DAILY. 06/12/15  Yes Baird Cancer, PA-C  Omega-3 Fatty Acids (FISH OIL) 1000 MG CAPS Take 2 capsules by mouth daily.   Yes Historical Provider, MD  oxyCODONE-acetaminophen (ROXICET) 5-325 MG tablet Take 1-2 tablets by mouth every 6 (six) hours as needed for moderate pain. 02/18/15  Yes Conrad [place], MD  chlorpheniramine-HYDROcodone [facility] PENNKINETIC ER) 10-8 MG/5ML SUER Take 5 mLs by mouth every 12 (twelve) hours as needed for cough. 04/29/15   Tammy Triplett, PA-C  HYDROcodone-acetaminophen (NORCO/VICODIN) 5-325 MG tablet Take 1 tablet by mouth every 6 (six) hours as needed for severe pain. 06/24/15   Baird Cancer, PA-C  lenalidomide (REVLIMID) 5 MG capsule Take one tablet every other day, after dialysis 06/20/15   Baird Cancer, PA-C  ondansetron (ZOFRAN) 8 MG tablet Take 1 tablet (8 mg total) by mouth every 8 (eight) hours as needed for nausea or vomiting. 03/15/15   Baird Cancer, PA-C  predniSONE (DELTASONE) 20 MG tablet Take 2 tablets (40 mg total) by mouth daily. 04/25/15   Davonna Belling, MD     Allergies  Allergen Reactions  . Pamidronate Anaphylaxis    blood pressure     Social History:  reports that he has never smoked. He has never used smokeless tobacco. He reports that he does not drink alcohol or use illicit drugs.    Family History  Problem Relation Age of Onset  . Cancer Sister   . Cancer Brother        Physical Exam:  GEN:  Obese 70 y.o. Caucasian male examined and in no acute distress; cooperative with exam Filed Vitals:   07/14/15 2100 07/14/15 2220 07/15/15 0000 07/15/15 0030  BP: 151/101 154/108 164/96 161/103  Pulse: 79 82 79 78  Temp:      TempSrc:      Resp: 18 17 14 17   Height:      Weight:      SpO2: 97% 96% 95% 95%   Blood pressure 161/103, pulse 78, temperature 98.5 F (36.9 C), temperature source Temporal,  resp. rate 17, height 5' 11"  (1.803 m), weight 107.956 kg (238 lb), SpO2 95 %. PSYCH: He is alert and oriented x4; does not appear anxious does not appear depressed; affect is normal HEENT: Normocephalic and Atraumatic, Mucous membranes pink; PERRLA; EOM intact; Fundi:  Benign;  No scleral icterus, Nares: Patent, Oropharynx: Clear,  Fair Dentition,    Neck:  FROM, No Cervical Lymphadenopathy nor Thyromegaly or Carotid Bruit; No JVD; Breasts:: Not examined CHEST WALL: No tenderness CHEST: Normal respiration, clear to auscultation bilaterally HEART: Regular rate and rhythm; no murmurs rubs or gallops BACK: No kyphosis or scoliosis; No CVA tenderness ABDOMEN: Positive Bowel Sounds, Obese, Soft Non-Tender, No Rebound or Guarding; No Masses, No Organomegaly, No Pannus; No Intertriginous candida. Rectal Exam: Not done EXTREMITIES: No  Cyanosis, Clubbing, or Edema; No Ulcerations. Genitalia: not examined PULSES: 2+ and symmetric SKIN: Normal hydration no rash or ulceration CNS:  Alert and Oriented x 4,  No Focal Deficits Vascular: pulses palpable throughout    Labs on Admission:  Basic Metabolic Panel:  Recent Labs Lab 07/10/15 0930 07/14/15 2133  NA 141 137  K 4.1 4.2  CL 102 101  CO2 29 26  GLUCOSE 97 93  BUN 38* 40*  CREATININE 5.20* 5.49*  CALCIUM 8.4* 7.7*   Liver Function Tests:  Recent Labs Lab 07/10/15 0930 07/14/15 2133  AST 19 22  ALT 22 24  ALKPHOS 33* 37*  BILITOT 1.2 0.8  PROT 6.2* 6.6  ALBUMIN 3.2* 3.4*    Recent Labs Lab 07/14/15 2133  LIPASE 30   No results for input(s): AMMONIA in the last 168 hours. CBC:  Recent Labs Lab 07/08/15 0929 07/10/15 0930 07/14/15 2133  WBC 1.6* 1.2* 1.6*  NEUTROABS  --   --  0.3*  HGB 8.4* 8.9* 10.2*  HCT 25.8* 26.4* 30.7*  MCV 113.7* 107.7* 106.6*  PLT 6* 18* 11*   Cardiac Enzymes:  Recent Labs Lab 07/14/15 2133 07/15/15 0014  TROPONINI 0.13* 0.11*    BNP (last 3 results) No results for input(s): BNP  in the last 8760 hours.  ProBNP (last 3 results) No results for input(s): PROBNP in the last 8760 hours.  CBG: No results for input(s): GLUCAP in the last 168 hours.  Radiological Exams on Admission: Ct Abdomen Pelvis Wo Contrast  07/15/2015  CLINICAL DATA:  Acute onset of nausea vomiting and diarrhea. Sharp stabbing right upper quadrant abdominal pain. Initial encounter. EXAM: CT ABDOMEN AND PELVIS WITHOUT CONTRAST TECHNIQUE: Multidetector CT imaging of the abdomen and pelvis was performed following the standard protocol without IV contrast. COMPARISON:  PET/CT performed 04/04/2010 FINDINGS: A small right pleural effusion is noted, with associated atelectasis. Minimal interstitial prominence is noted at the lung bases. Diffuse coronary artery calcifications are seen. The liver and spleen are unremarkable in appearance. There is minimal haziness about the gallbladder, of uncertain significance. The gallbladder is otherwise grossly unremarkable on CT. The pancreas and adrenal glands are unremarkable. Severe chronic bilateral renal atrophy is noted, with a few bilateral renal cysts and minimal nonobstructing stones. Nonspecific perinephric stranding is noted bilaterally. There is no evidence of hydronephrosis. No obstructing ureteral stones are identified. A focus of calcification within the mid abdominal mesentery is likely benign. No free fluid is identified. The small bowel is unremarkable in appearance. The stomach is within normal limits. No acute vascular abnormalities are seen. The appendix is normal in caliber, without evidence of appendicitis. The colon is unremarkable in appearance. The bladder is mildly distended and grossly unremarkable. The prostate remains normal in size, with scattered calcification. No inguinal lymphadenopathy is seen. No acute osseous abnormalities are identified. The patient is status post vertebroplasty at T11. Disc space narrowing is noted at L4-L5. IMPRESSION: 1. Minimal  haziness about the gallbladder, of uncertain significance. It is otherwise unremarkable in appearance on CT. If the patient's symptoms persist, right upper quadrant ultrasound could be considered for further evaluation. 2. Small right pleural effusion, with associated atelectasis. Minimal interstitial prominence at the lung bases. 3. Diffuse coronary artery calcifications seen. 4. Severe chronic bilateral renal atrophy, with a few bilateral renal cysts and minimal nonobstructing stones. Electronically Signed   By: Garald Balding M.D.   On: 07/15/2015 00:24   Dg Chest 2 View  07/15/2015  CLINICAL DATA:  Acute onset of diarrhea and vomiting. Initial encounter. EXAM: CHEST  2 VIEW COMPARISON:  Chest radiograph performed 04/24/2015 FINDINGS: The lungs are well-aerated. Vascular congestion is noted, with  right perihilar opacities, concerning for pneumonia. Asymmetric interstitial edema might have a similar appearance. A small right pleural effusion is noted. No pneumothorax is seen. The heart is mildly enlarged. A right-sided chest port is noted ending about the mid SVC. No acute osseous abnormalities are seen. Cervical spinal fusion hardware is noted. IMPRESSION: Vascular congestion and mild cardiomegaly, with right perihilar opacities, concerning for pneumonia. Asymmetric interstitial edema might have a similar appearance. Small right pleural effusion noted. Followup PA and lateral chest X-ray is recommended in 3-4 weeks following completion of treatment, to ensure resolution and exclude underlying malignancy. Electronically Signed   By: Garald Balding M.D.   On: 07/15/2015 00:35     EKG: Independently reviewed. Normal Siinus Rhythm  Rate = 79,  Q-T prolongation , + PACs    Assessment/Plan:   71 y.o. male with  Principal Problem:    1.    Nausea vomiting and diarrhea    PRN IV Zofran    Enteric Precautions rule out C.Diff     Initiate C.Diff Rx if C.diff PCR +   Active Problems:    2.    Elevated  troponin- Trending Downward, Probable Demand ISchemia    Cycle Troponins    Cardiac Monitoring      3.    Chronic CHF (congestive heart failure) (HCC)    Dialysis PRN    4.    Essential hypertension, benign    PRN IV Hydralazine    continue Lisinopril Rx    Monitor BPs      5.    Multiple myeloma (HCC)    Notify Oncology in AM      6.    ESRD on dialysis [facility])    Notify Renal in AM      7.    Pancytopenia ([place])    Monitor Trend    Notify Oncology in AM      8.    DVT Prophylaxis     SCDs        Code Status:     FULL CODE       Family Communication:   Family at Bedside   No Family Present    Disposition Plan:    Inpatient  Observation Status        Time spent:  [facility] Pager [phone]   If [place] Please Contact the Day Rounding Team MD for [facility]  If 7PM-7AM, Please Contact Night-Floor Coverage  [email or web address] Password [email or web address] 07/15/2015, 1:39 AM     ADDENDUM:   Patient was seen and examined on 07/15/2015

## 2015-07-15 NOTE — Consult Note (Signed)
Reason for Consult: End-stage renal disease Referring Physician: Dr. Raymondo Band is an 70 y.o. male.  HPI: The patient was history of hypertension, multiple myeloma, bone marrow transplant and end-stage renal disease on maintenance hemodialysis Tuesday, Thursday and Saturday presently came with complaints of nausea and vomiting associated with diarrhea for the last couple of days. Patient denies any fever chills or sweating. He denies also any abdominal pain. Patient states that his improving and he has one episode of diarrhea since he came to the hospital. Patient at this moment denies any difficulty breathing.  Past Medical History  Diagnosis Date  . Hypertension   . Ruptured cervical disc   . Fall resulting in striking against other object     paralyzed  . Multiple myeloma   . Recurrent multiple myeloma of bone marrow with unknown EBV status ([place])   . Multiple myeloma ([place]) 01/26/2011  . Unspecified vitamin D deficiency 03/06/2013  . Anemia of chronic disease 12/30/2013  . Constipation   . Dialysis patient ([place]) 2016  . Thrombocytopenia ([place]) 08/31/2014  . 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 Constipation     Past Surgical History  Procedure Laterality Date  . Inguinal hernia repair Bilateral   . Anterior cervical decomp/discectomy fusion    . Bone marrow aspirate and biopsy wiith lumbar puncture    . Melanoma excision      rt. breast  . Cervical spine surgery    . Kyphoplasty Bilateral 01/30/2014    Procedure: Thoracic eleven Kyphoplasty;  Surgeon: Consuella Lose, MD;  Location: [facility];  Service: Neurosurgery;  Laterality: Bilateral;  Thoracic eleven Kyphoplasty  . Av fistula placement Right 04/30/2014    Procedure: BRACHIOCEPHALIC ARTERIOVENOUS (AV) FISTULA CREATION;  Surgeon: Conrad Calumet, MD;  Location: [facility];  Service: Vascular;  Laterality:  Right;  . Portacath placement Left 02/18/2015    Procedure: INSERTION OF PORT A CATH RIGHT INTERNAL JUGULAR VEIN ;  Surgeon: Conrad McCammon, MD;  Location: [facility];  Service: Vascular;  Laterality: Left;  . Cataract extraction w/phaco Right 04/15/2015    Procedure: CATARACT EXTRACTION PHACO AND INTRAOCULAR LENS PLACEMENT (IOC);  Surgeon: Tonny Branch, MD;  Location: [facility];  Service: Ophthalmology;  Laterality: Right;  CDE:8.97  . Cataract extraction w/phaco Left 04/29/2015    Procedure: CATARACT EXTRACTION PHACO AND INTRAOCULAR LENS PLACEMENT ; CDE:  7.61;  Surgeon: Tonny Branch, MD;  Location: [facility];  Service: Ophthalmology;  Laterality: Left;    Family History  Problem Relation Age of Onset  . Cancer Sister   . Cancer Brother     Social History:  reports that he has never smoked. He has never used smokeless tobacco. He reports that he does not drink alcohol or use illicit drugs.  Allergies:  Allergies  Allergen Reactions  . Pamidronate Anaphylaxis    blood pressure     Medications: I have reviewed the patient's current medications.  Results for orders placed or performed during the hospital encounter of 07/14/15 (from the past 48 hour(s))  Comprehensive metabolic panel     Status: Abnormal   Collection Time: 07/14/15  9:33 PM  Result Value Ref Range   Sodium 137 135 - 145 mmol/L   Potassium 4.2 3.5 - 5.1 mmol/L   Chloride 101 101 - 111 mmol/L   CO2 26 22 - 32 mmol/L   Glucose, Bld 93  65 - 99 mg/dL   BUN 40 (H) 6 - 20 mg/dL   Creatinine, Ser 5.49 (H) 0.61 - 1.24 mg/dL   Calcium 7.7 (L) 8.9 - 10.3 mg/dL   Total Protein 6.6 6.5 - 8.1 g/dL   Albumin 3.4 (L) 3.5 - 5.0 g/dL   AST 22 15 - 41 U/L   ALT 24 17 - 63 U/L   Alkaline Phosphatase 37 (L) 38 - 126 U/L   Total Bilirubin 0.8 0.3 - 1.2 mg/dL   GFR calc non Af Amer 10 (L) >60 mL/min   GFR calc Af Amer 11 (L) >60 mL/min    Comment: (NOTE) The eGFR has been calculated using the CKD EPI equation. This calculation has not been  validated in all clinical situations. eGFR's persistently <60 mL/min signify possible Chronic Kidney Disease.    Anion gap 10 5 - 15  Troponin I     Status: Abnormal   Collection Time: 07/14/15  9:33 PM  Result Value Ref Range   Troponin I 0.13 (H) <0.031 ng/mL    Comment:        PERSISTENTLY INCREASED TROPONIN VALUES IN THE RANGE OF 0.04-0.49 ng/mL CAN BE SEEN IN:       -UNSTABLE ANGINA       -CONGESTIVE HEART FAILURE       -MYOCARDITIS       -CHEST TRAUMA       -ARRYHTHMIAS       -LATE PRESENTING MYOCARDIAL INFARCTION       -COPD   CLINICAL FOLLOW-UP RECOMMENDED.   Lipase, blood     Status: None   Collection Time: 07/14/15  9:33 PM  Result Value Ref Range   Lipase 30 11 - 51 U/L  CBC with Differential     Status: Abnormal   Collection Time: 07/14/15  9:33 PM  Result Value Ref Range   WBC 1.6 (L) 4.0 - 10.5 K/uL   RBC 2.88 (L) 4.22 - 5.81 MIL/uL   Hemoglobin 10.2 (L) 13.0 - 17.0 g/dL   HCT 30.7 (L) 39.0 - 52.0 %   MCV 106.6 (H) 78.0 - 100.0 fL   MCH 35.4 (H) 26.0 - 34.0 pg   MCHC 33.2 30.0 - 36.0 g/dL   RDW 22.8 (H) 11.5 - 15.5 %   Platelets 11 (LL) 150 - 400 K/uL    Comment: RESULT REPEATED AND VERIFIED CRITICAL RESULT CALLED TO, READ BACK BY AND VERIFIED WITH: SPECIMEN CHECKED FOR CLOTS CONSISTENT WITH PREVIOUS RESULT RESULT REPEATED AND VERIFIED CRITICAL RESULT CALLED TO, READ BACK BY AND VERIFIED WITH:  LASHLEY,S @ 2244 ON 07/14/15 BY WOODIE,J    Neutrophils Relative % 20 %   Neutro Abs 0.3 (L) 1.7 - 7.7 K/uL   Lymphocytes Relative 72 %   Lymphs Abs 1.1 0.7 - 4.0 K/uL   Monocytes Relative 6 %   Monocytes Absolute 0.1 0.1 - 1.0 K/uL   Eosinophils Relative 2 %   Eosinophils Absolute 0.0 0.0 - 0.7 K/uL   Basophils Relative 0 %   Basophils Absolute 0.0 0.0 - 0.1 K/uL  Lactic acid, plasma     Status: None   Collection Time: 07/14/15  9:34 PM  Result Value Ref Range   Lactic Acid, Venous 0.7 0.5 - 2.0 mmol/L  Lactic acid, plasma     Status: None   Collection  Time: 07/15/15 12:14 AM  Result Value Ref Range   Lactic Acid, Venous 0.8 0.5 - 2.0 mmol/L  Troponin I     Status:  Abnormal   Collection Time: 07/15/15 12:14 AM  Result Value Ref Range   Troponin I 0.11 (H) <0.031 ng/mL    Comment:        PERSISTENTLY INCREASED TROPONIN VALUES IN THE RANGE OF 0.04-0.49 ng/mL CAN BE SEEN IN:       -UNSTABLE ANGINA       -CONGESTIVE HEART FAILURE       -MYOCARDITIS       -CHEST TRAUMA       -ARRYHTHMIAS       -LATE PRESENTING MYOCARDIAL INFARCTION       -COPD   CLINICAL FOLLOW-UP RECOMMENDED.   Troponin I (q 6hr x 3)     Status: Abnormal   Collection Time: 07/15/15  2:17 AM  Result Value Ref Range   Troponin I 0.12 (H) <0.031 ng/mL    Comment:        PERSISTENTLY INCREASED TROPONIN VALUES IN THE RANGE OF 0.04-0.49 ng/mL CAN BE SEEN IN:       -UNSTABLE ANGINA       -CONGESTIVE HEART FAILURE       -MYOCARDITIS       -CHEST TRAUMA       -ARRYHTHMIAS       -LATE PRESENTING MYOCARDIAL INFARCTION       -COPD   CLINICAL FOLLOW-UP RECOMMENDED.   MRSA PCR Screening     Status: None   Collection Time: 07/15/15  3:00 AM  Result Value Ref Range   MRSA by PCR NEGATIVE NEGATIVE    Comment:        The GeneXpert MRSA Assay (FDA approved for NASAL specimens only), is one component of a comprehensive MRSA colonization surveillance program. It is not intended to diagnose MRSA infection nor to guide or monitor treatment for MRSA infections.   Basic metabolic panel     Status: Abnormal   Collection Time: 07/15/15  4:25 AM  Result Value Ref Range   Sodium 140 135 - 145 mmol/L   Potassium 3.9 3.5 - 5.1 mmol/L   Chloride 106 101 - 111 mmol/L   CO2 21 (L) 22 - 32 mmol/L   Glucose, Bld 82 65 - 99 mg/dL   BUN 43 (H) 6 - 20 mg/dL   Creatinine, Ser 5.84 (H) 0.61 - 1.24 mg/dL   Calcium 7.1 (L) 8.9 - 10.3 mg/dL   GFR calc non Af Amer 9 (L) >60 mL/min   GFR calc Af Amer 10 (L) >60 mL/min    Comment: (NOTE) The eGFR has been calculated using the CKD  EPI equation. This calculation has not been validated in all clinical situations. eGFR's persistently <60 mL/min signify possible Chronic Kidney Disease.    Anion gap 13 5 - 15  CBC     Status: Abnormal   Collection Time: 07/15/15  4:25 AM  Result Value Ref Range   WBC 1.3 (LL) 4.0 - 10.5 K/uL    Comment: RESULT REPEATED AND VERIFIED WHITE COUNT CONFIRMED ON SMEAR CRITICAL RESULT CALLED TO, READ BACK BY AND VERIFIED WITH: Gershon Cull RN ON [id number] AT 0605 BY RESSEGGER R    RBC 2.68 (L) 4.22 - 5.81 MIL/uL   Hemoglobin 9.6 (L) 13.0 - 17.0 g/dL   HCT 28.8 (L) 39.0 - 52.0 %   MCV 107.5 (H) 78.0 - 100.0 fL   MCH 35.8 (H) 26.0 - 34.0 pg   MCHC 33.3 30.0 - 36.0 g/dL   RDW 22.9 (H) 11.5 - 15.5 %   Platelets 9 (LL) 150 - 400  K/uL    Comment: RESULT REPEATED AND VERIFIED PLATELET COUNT CONFIRMED BY SMEAR CRITICAL RESULT CALLED TO, READ BACK BY AND VERIFIED WITHClare Gandy RN ON [id number] AT 0605 BY RESSEGGER R     Ct Abdomen Pelvis Wo Contrast  07/15/2015  CLINICAL DATA:  Acute onset of nausea vomiting and diarrhea. Sharp stabbing right upper quadrant abdominal pain. Initial encounter. EXAM: CT ABDOMEN AND PELVIS WITHOUT CONTRAST TECHNIQUE: Multidetector CT imaging of the abdomen and pelvis was performed following the standard protocol without IV contrast. COMPARISON:  PET/CT performed 04/04/2010 FINDINGS: A small right pleural effusion is noted, with associated atelectasis. Minimal interstitial prominence is noted at the lung bases. Diffuse coronary artery calcifications are seen. The liver and spleen are unremarkable in appearance. There is minimal haziness about the gallbladder, of uncertain significance. The gallbladder is otherwise grossly unremarkable on CT. The pancreas and adrenal glands are unremarkable. Severe chronic bilateral renal atrophy is noted, with a few bilateral renal cysts and minimal nonobstructing stones. Nonspecific perinephric stranding is noted bilaterally. There is  no evidence of hydronephrosis. No obstructing ureteral stones are identified. A focus of calcification within the mid abdominal mesentery is likely benign. No free fluid is identified. The small bowel is unremarkable in appearance. The stomach is within normal limits. No acute vascular abnormalities are seen. The appendix is normal in caliber, without evidence of appendicitis. The colon is unremarkable in appearance. The bladder is mildly distended and grossly unremarkable. The prostate remains normal in size, with scattered calcification. No inguinal lymphadenopathy is seen. No acute osseous abnormalities are identified. The patient is status post vertebroplasty at T11. Disc space narrowing is noted at L4-L5. IMPRESSION: 1. Minimal haziness about the gallbladder, of uncertain significance. It is otherwise unremarkable in appearance on CT. If the patient's symptoms persist, right upper quadrant ultrasound could be considered for further evaluation. 2. Small right pleural effusion, with associated atelectasis. Minimal interstitial prominence at the lung bases. 3. Diffuse coronary artery calcifications seen. 4. Severe chronic bilateral renal atrophy, with a few bilateral renal cysts and minimal nonobstructing stones. Electronically Signed   By: Garald Balding M.D.   On: 07/15/2015 00:24   Dg Chest 2 View  07/15/2015  CLINICAL DATA:  Acute onset of diarrhea and vomiting. Initial encounter. EXAM: CHEST  2 VIEW COMPARISON:  Chest radiograph performed 04/24/2015 FINDINGS: The lungs are well-aerated. Vascular congestion is noted, with right perihilar opacities, concerning for pneumonia. Asymmetric interstitial edema might have a similar appearance. A small right pleural effusion is noted. No pneumothorax is seen. The heart is mildly enlarged. A right-sided chest port is noted ending about the mid SVC. No acute osseous abnormalities are seen. Cervical spinal fusion hardware is noted. IMPRESSION: Vascular congestion and  mild cardiomegaly, with right perihilar opacities, concerning for pneumonia. Asymmetric interstitial edema might have a similar appearance. Small right pleural effusion noted. Followup PA and lateral chest X-ray is recommended in 3-4 weeks following completion of treatment, to ensure resolution and exclude underlying malignancy. Electronically Signed   By: Garald Balding M.D.   On: 07/15/2015 00:35    Review of Systems  Constitutional: Negative for fever and chills.  Respiratory: Negative for cough and shortness of breath.   Cardiovascular: Negative for orthopnea.  Gastrointestinal: Positive for nausea, vomiting and diarrhea. Negative for abdominal pain and blood in stool.  Neurological: Positive for weakness.   Blood pressure 118/90, pulse 57, temperature 97.1 F (36.2 C), temperature source Oral, resp. rate 16, height 5' 11"  (1.803 m), weight  239 lb 10.2 oz (108.7 kg), SpO2 100 %. Physical Exam  Constitutional: He is oriented to person, place, and time. No distress.  Eyes: No scleral icterus.  Neck: No JVD present.  Cardiovascular: Normal rate and regular rhythm.   No murmur heard. Respiratory: He has no wheezes. He has no rales.  GI: He exhibits no distension. There is no tenderness. There is no rebound.  Musculoskeletal: He exhibits no edema.  Neurological: He is alert and oriented to person, place, and time.    Assessment/Plan: Problem #1 gastroenteritis. Presently seems to be improving. Etiology not clear. Patient was not taking any antibiotics recently. Stool for C. difficile is not done this morning as patient flushed the bath room, after using it. Problem #2 end-stage renal disease: Status post hemodialysis on Saturday. Presently patient doesn't have any sign of fluid overload. His next dialysis will be tomorrow Problem #3 hypertension: His blood pressure is slightly higher than normal. Problem #4 anemia: His hemoglobin is low. This could be a part of pancytopenia. His plateletes  are  very low but denies any bleeding. Presently his weight is and plastic felt declining further Problem #5 history of multiple myeloma: Status post bone marrow transplant and being followed by oncology. Problem #6 metabolic bone  disease: His calcium is range Plan: 1]Will DC IV fluid 2] will make arrangements for patient to dialysis tomorrow 3] patient may require reverse isolation if his white blood cell count continued to decline. 4] we'll check renal panel and CBC in the morning 5] will receive Epogen 10,000 units after each dialysis.    Emilene Roma S 07/15/2015, 8:51 AM

## 2015-07-15 NOTE — Progress Notes (Signed)
*  PRELIMINARY RESULTS* Echocardiogram 2D Echocardiogram has been performed.  Leavy Cella 07/15/2015, 4:54 PM

## 2015-07-15 NOTE — ED Notes (Signed)
Pt is refusing any further blood draws at this time. Pt informed of why the tests were order and stated refusal again. Dr Arnoldo Morale notified.

## 2015-07-15 NOTE — Consult Note (Addendum)
Patient's case discussed with Dr. Jerilee Hoh who is the patient's attending. No role for hematology at this point in time. I recommend supportive care with regards to his blood counts. He's been pancytopenic for over one year. He does have a bone marrow aspiration and biopsy performed at [facility] that suspicious for impending relapse of multiple myeloma. As a result, he's been on Revlimid at a suboptimal dose (patient compliance issues). Due to change in blood counts over the past few weeks, he's been set up for repeat bone marrow aspiration and biopsy as an outpatient. Holding Revlimid at this point in time is certainly appropriate while he is in the hospital.  Hematology recommendations are as follows: 1. Maintain a hemoglobin greater than 8-8.5 g/dL with packed red blood cells. 2. Maintain a platelet count greater than or equal to 10,000 with pheresis platelets. May give additional platelets despite platelet count for any active bleeding. 3. No role for GSF at this time, given no source of active infection and being afebrile.  I did place orders for platelet transfusion tonight based upon a platelet count of 9000.  Please call hematology with any questions or concerns moving forward.  KEFALAS,THOMAS, PA-C 07/15/2015 7:00 PM

## 2015-07-15 NOTE — ED Notes (Signed)
Pt now stating "I'll get it done one more time and that's it.". Pt has given permission for Troponin blood draw at this time.

## 2015-07-16 ENCOUNTER — Other Ambulatory Visit: Payer: Self-pay | Admitting: General Surgery

## 2015-07-16 DIAGNOSIS — R197 Diarrhea, unspecified: Secondary | ICD-10-CM

## 2015-07-16 DIAGNOSIS — R112 Nausea with vomiting, unspecified: Secondary | ICD-10-CM

## 2015-07-16 DIAGNOSIS — N186 End stage renal disease: Secondary | ICD-10-CM

## 2015-07-16 DIAGNOSIS — Z992 Dependence on renal dialysis: Secondary | ICD-10-CM

## 2015-07-16 LAB — CBC
HEMATOCRIT: 27.3 % — AB (ref 39.0–52.0)
HEMATOCRIT: 27.3 % — AB (ref 39.0–52.0)
HEMOGLOBIN: 8.9 g/dL — AB (ref 13.0–17.0)
HEMOGLOBIN: 8.8 g/dL — AB (ref 13.0–17.0)
MCH: 35.3 pg — AB (ref 26.0–34.0)
MCH: 34.9 pg — AB (ref 26.0–34.0)
MCHC: 32.6 g/dL (ref 30.0–36.0)
MCHC: 32.2 g/dL (ref 30.0–36.0)
MCV: 108.3 fL — AB (ref 78.0–100.0)
MCV: 108.3 fL — AB (ref 78.0–100.0)
Platelets: 25 10*3/uL — CL (ref 150–400)
Platelets: 28 10*3/uL — CL (ref 150–400)
RBC: 2.52 MIL/uL — AB (ref 4.22–5.81)
RBC: 2.52 MIL/uL — AB (ref 4.22–5.81)
RDW: 22.6 % — ABNORMAL HIGH (ref 11.5–15.5)
RDW: 22.7 % — ABNORMAL HIGH (ref 11.5–15.5)
WBC: 1 10*3/uL — CL (ref 4.0–10.5)
WBC: 1.1 10*3/uL — CL (ref 4.0–10.5)

## 2015-07-16 LAB — RENAL FUNCTION PANEL
ANION GAP: 10 (ref 5–15)
Albumin: 3.1 g/dL — ABNORMAL LOW (ref 3.5–5.0)
BUN: 52 mg/dL — ABNORMAL HIGH (ref 6–20)
CHLORIDE: 104 mmol/L (ref 101–111)
CO2: 24 mmol/L (ref 22–32)
Calcium: 6.9 mg/dL — ABNORMAL LOW (ref 8.9–10.3)
Creatinine, Ser: 7.28 mg/dL — ABNORMAL HIGH (ref 0.61–1.24)
GFR calc non Af Amer: 7 mL/min — ABNORMAL LOW (ref >60)
GFR, EST AFRICAN AMERICAN: 8 mL/min — AB (ref >60)
GLUCOSE: 124 mg/dL — AB (ref 65–99)
POTASSIUM: 4.2 mmol/L (ref 3.5–5.1)
Phosphorus: 4.2 mg/dL (ref 2.5–4.6)
SODIUM: 138 mmol/L (ref 135–145)

## 2015-07-16 MED ORDER — HEPARIN SOD (PORK) LOCK FLUSH 100 UNIT/ML IV SOLN
500.0000 [IU] | INTRAVENOUS | Status: AC | PRN
  Administered 2015-07-16: 500 [IU]
  Filled 2015-07-16: qty 5

## 2015-07-16 NOTE — Discharge Summary (Signed)
Physician Discharge Summary  [name] ZGY:[id number] DOB: 10/26/1945 DOA: 07/14/2015  PCP: No PCP Per Patient  Admit date: 07/14/2015 Discharge date: 07/16/2015  Time spent: 45 minutes  Recommendations for Outpatient Follow-up:  -We'll be discharge home today. -Advised to follow-up with his dialysis center as scheduled.   Discharge Diagnoses:  Principal Problem:   Nausea vomiting and diarrhea Active Problems:   Essential hypertension, benign   Multiple myeloma (HCC)   ESRD on dialysis ([place])   Pancytopenia (HCC)   Elevated troponin   Chronic CHF (congestive heart failure) ([place])   Discharge Condition: Stable and improved  Filed Weights   07/14/15 1940 07/15/15 0258 07/16/15 0500  Weight: 107.956 kg (238 lb) 108.7 kg (239 lb 10.2 oz) 110.1 kg (242 lb 11.6 oz)    History of present illness:  As per Dr. Arnoldo Morale on 2/27: [name] is a 70 y.o. male with a history of Multiple Myeloma, ESRD on HD ( Tues, Thurs, Sat), and HTN who presents to the ED with complaints of Nausea Vomiting and Diarrhea x 5 days. He denies any fevers or chills and denies any sick contacts. He was evaluated in the ED and a C.diff Workup was initiated, and he was referred for admission.   Hospital Course:   Nausea/vomiting/diarrhea -Resolved, likely represents acute viral gastroenteritis.  End-stage renal disease -Due for hemodialysis today and can discharge afterwards.  Multiple myeloma/pancytopenia -Is due for bone marrow aspiration and [place] tomorrow. -Has been giving a platelet transfusion given initial platelets of 9000, these have increased to 25,000 on discharge.  Procedures:  None   Consultations:  Telephone consultation with oncology.  Discharge Instructions  Discharge Instructions    Diet - low sodium heart healthy    Complete by:  As directed      Increase activity slowly    Complete by:  As directed             Medication List    STOP taking  these medications        chlorpheniramine-HYDROcodone 10-8 MG/5ML [place]  Commonly known as:  TUSSIONEX PENNKINETIC ER     predniSONE 20 MG tablet  Commonly known as:  DELTASONE      TAKE these medications        Fish Oil 1000 MG Caps  Take 2 capsules by mouth daily.     HYDROcodone-acetaminophen 5-325 MG tablet  Commonly known as:  NORCO/VICODIN  Take 1 tablet by mouth every 6 (six) hours as needed for severe pain.     lenalidomide 5 MG capsule  Commonly known as:  REVLIMID  Take one tablet every other day, after dialysis     lisinopril 20 MG tablet  Commonly known as:  PRINIVIL,ZESTRIL  Take 40 mg by mouth every morning.     LYRICA 75 MG capsule  Generic drug:  pregabalin  TAKE 1 CAPSULE BY MOUTH TWICE DAILY.     ondansetron 8 MG tablet  Commonly known as:  ZOFRAN  Take 1 tablet (8 mg total) by mouth every 8 (eight) hours as needed for nausea or vomiting.     oxyCODONE-acetaminophen 5-325 MG tablet  Commonly known as:  ROXICET  Take 1-2 tablets by mouth every 6 (six) hours as needed for moderate pain.       Allergies  Allergen Reactions  . Pamidronate Anaphylaxis    blood pressure       The results of significant diagnostics from this hospitalization (including imaging, microbiology, ancillary and  laboratory) are listed below for reference.    Significant Diagnostic Studies: Ct Abdomen Pelvis Wo Contrast  07/15/2015  CLINICAL DATA:  Acute onset of nausea vomiting and diarrhea. Sharp stabbing right upper quadrant abdominal pain. Initial encounter. EXAM: CT ABDOMEN AND PELVIS WITHOUT CONTRAST TECHNIQUE: Multidetector CT imaging of the abdomen and pelvis was performed following the standard protocol without IV contrast. COMPARISON:  PET/CT performed 04/04/2010 FINDINGS: A small right pleural effusion is noted, with associated atelectasis. Minimal interstitial prominence is noted at the lung bases. Diffuse coronary artery calcifications are seen. The liver and spleen  are unremarkable in appearance. There is minimal haziness about the gallbladder, of uncertain significance. The gallbladder is otherwise grossly unremarkable on CT. The pancreas and adrenal glands are unremarkable. Severe chronic bilateral renal atrophy is noted, with a few bilateral renal cysts and minimal nonobstructing stones. Nonspecific perinephric stranding is noted bilaterally. There is no evidence of hydronephrosis. No obstructing ureteral stones are identified. A focus of calcification within the mid abdominal mesentery is likely benign. No free fluid is identified. The small bowel is unremarkable in appearance. The stomach is within normal limits. No acute vascular abnormalities are seen. The appendix is normal in caliber, without evidence of appendicitis. The colon is unremarkable in appearance. The bladder is mildly distended and grossly unremarkable. The prostate remains normal in size, with scattered calcification. No inguinal lymphadenopathy is seen. No acute osseous abnormalities are identified. The patient is status post vertebroplasty at T11. Disc space narrowing is noted at L4-L5. IMPRESSION: 1. Minimal haziness about the gallbladder, of uncertain significance. It is otherwise unremarkable in appearance on CT. If the patient's symptoms persist, right upper quadrant ultrasound could be considered for further evaluation. 2. Small right pleural effusion, with associated atelectasis. Minimal interstitial prominence at the lung bases. 3. Diffuse coronary artery calcifications seen. 4. Severe chronic bilateral renal atrophy, with a few bilateral renal cysts and minimal nonobstructing stones. Electronically Signed   By: Garald Balding M.D.   On: 07/15/2015 00:24   Dg Chest 2 View  07/15/2015  CLINICAL DATA:  Acute onset of diarrhea and vomiting. Initial encounter. EXAM: CHEST  2 VIEW COMPARISON:  Chest radiograph performed 04/24/2015 FINDINGS: The lungs are well-aerated. Vascular congestion is noted,  with right perihilar opacities, concerning for pneumonia. Asymmetric interstitial edema might have a similar appearance. A small right pleural effusion is noted. No pneumothorax is seen. The heart is mildly enlarged. A right-sided chest port is noted ending about the mid SVC. No acute osseous abnormalities are seen. Cervical spinal fusion hardware is noted. IMPRESSION: Vascular congestion and mild cardiomegaly, with right perihilar opacities, concerning for pneumonia. Asymmetric interstitial edema might have a similar appearance. Small right pleural effusion noted. Followup PA and lateral chest X-ray is recommended in 3-4 weeks following completion of treatment, to ensure resolution and exclude underlying malignancy. Electronically Signed   By: Garald Balding M.D.   On: 07/15/2015 00:35    Microbiology: Recent Results (from the past 240 hour(s))  MRSA PCR Screening     Status: None   Collection Time: 07/15/15  3:00 AM  Result Value Ref Range Status   MRSA by PCR NEGATIVE NEGATIVE Final    Comment:        The GeneXpert MRSA Assay (FDA approved for NASAL specimens only), is one component of a comprehensive MRSA colonization surveillance program. It is not intended to diagnose MRSA infection nor to guide or monitor treatment for MRSA infections.      Labs: Basic Metabolic Panel:  Recent Labs Lab 07/10/15 0930 07/14/15 2133 07/15/15 0425 07/15/15 2033 07/16/15 0849  NA 141 137 140 141 138  K 4.1 4.2 3.9 4.7 4.2  CL 102 101 106 105 104  CO2 29 26 21* 26 24  GLUCOSE 97 93 82 87 124*  BUN 38* 40* 43* 49* 52*  CREATININE 5.20* 5.49* 5.84* 6.73* 7.28*  CALCIUM 8.4* 7.7* 7.1* 7.2* 6.9*  PHOS  --   --   --  3.8 4.2   Liver Function Tests:  Recent Labs Lab 07/10/15 0930 07/14/15 2133 07/15/15 2033 07/16/15 0849  AST 19 22  --   --   ALT 22 24  --   --   ALKPHOS 33* 37*  --   --   BILITOT 1.2 0.8  --   --   PROT 6.2* 6.6  --   --   ALBUMIN 3.2* 3.4* 3.1* 3.1*    Recent  Labs Lab 07/14/15 2133  LIPASE 30   No results for input(s): AMMONIA in the last 168 hours. CBC:  Recent Labs Lab 07/14/15 2133 07/15/15 0425 07/15/15 2033 07/16/15 0135 07/16/15 0420  WBC 1.6* 1.3* 1.2* 1.0* 1.1*  NEUTROABS 0.3*  --   --   --   --   HGB 10.2* 9.6* 9.3* 8.9* 8.8*  HCT 30.7* 28.8* 28.9* 27.3* 27.3*  MCV 106.6* 107.5* 108.6* 108.3* 108.3*  PLT 11* 9* 6* 25* 28*   Cardiac Enzymes:  Recent Labs Lab 07/14/15 2133 07/15/15 0014 07/15/15 0217 07/15/15 0837 07/15/15 1342  TROPONINI 0.13* 0.11* 0.12* 0.15* 0.12*   BNP: BNP (last 3 results) No results for input(s): BNP in the last 8760 hours.  ProBNP (last 3 results) No results for input(s): PROBNP in the last 8760 hours.  CBG: No results for input(s): GLUCAP in the last 168 hours.     SignedLelon Frohlich  [facility] Pager: [phone] 07/16/2015, 10:59 AM

## 2015-07-16 NOTE — Procedures (Signed)
   HEMODIALYSIS TREATMENT NOTE:  4 hour heparin-free dialysis completed via right upper arm AVF (16g/antegrade). Goal met: 2.5 liters removed. Ultrafiltration was interrupted x 15 minutes due to cramping (relieved with UF break and NS bolus). All blood was returned and hemostasis was achieved within 20 minutes. Report called to Renee Chappelle, RN.  Angela Poteat, RN, CDN 

## 2015-07-16 NOTE — Progress Notes (Signed)
WBC 1.0 Platelets 25 MD notified via text page.

## 2015-07-16 NOTE — Progress Notes (Signed)
Patient refused po medication after dialysis. Patient states he will take his medication tonight.

## 2015-07-16 NOTE — Progress Notes (Signed)
Subjective: Patient is feeling much better. He doesn't have anymore of diarrhea and his last bowel movement was yesterday one time only. Patient also denies any nausea or vomiting.   Objective: Vital signs in last 24 hours: Temp:  [97 F (36.1 C)-98.2 F (36.8 C)] 97 F (36.1 C) (02/28 0400) Pulse Rate:  [55-93] 64 (02/28 0740) Resp:  [13-21] 21 (02/28 0740) BP: (116-157)/(82-103) 141/97 mmHg (02/28 0400) SpO2:  [90 %-100 %] 100 % (02/28 0740) Weight:  [242 lb 11.6 oz (110.1 kg)] 242 lb 11.6 oz (110.1 kg) (02/28 0500)  Intake/Output from previous day: 02/27 0701 - 02/28 0700 In: 134.2 [Blood:134.2] Out: -  Intake/Output this shift:     Recent Labs  07/14/15 2133 07/15/15 0425 07/15/15 2033 07/16/15 0135 07/16/15 0420  HGB 10.2* 9.6* 9.3* 8.9* 8.8*    Recent Labs  07/16/15 0135 07/16/15 0420  WBC 1.0* 1.1*  RBC 2.52* 2.52*  HCT 27.3* 27.3*  PLT 25* 28*    Recent Labs  07/15/15 0425 07/15/15 2033  NA 140 141  K 3.9 4.7  CL 106 105  CO2 21* 26  BUN 43* 49*  CREATININE 5.84* 6.73*  GLUCOSE 82 87  CALCIUM 7.1* 7.2*   No results for input(s): LABPT, INR in the last 72 hours.  Generally patient is alert no apparent distress Chest is clear to auscultation Heart exam regular rate and rhythm no murmur Abdomen: Soft positive bowel sounds Extremities he has trace edema  Assessment/Plan: Problem #1 end-stage renal disease: He is status post hemodialysis on Saturday. Patient is due for dialysis today. His potassium is normal and no uremic symptoms symptoms. Problem #2 anemia: His hemoglobin is below our target goal. Problem #3 pancytopenia: His white blood cell count remains low but that it is improving. Presently being followed by oncology. Problem #4 history of multiple myeloma Problem #5 hypertension: His blood pressure is reasonably controlled Problem #6 metabolic bone disease: His calcium is in range. His phosphorus is pending. Problem #7 gastroenteritis:  Seems to have improved. Plan: We'll make arrangements for patient to get dialysis today We'll dialyze him without heparin We'll use Epogen 10,000 units IV after each dialysis    [facility] S 07/16/2015, 7:46 AM

## 2015-07-16 NOTE — Care Management Note (Signed)
Case Management Note  Patient Details  Name: [name] MRN: [id number] Date of Birth: 04/13/1946  Subjective/Objective:                  Pt admitted with N/V/D. Pt is from home, lives alone and is ind with ADL's. Pt has no HH services or DME prior to admission. Pt receives HD at [facility] in [place]. Pt has PCP. Pt drives himself to HD and appointments. Pt plans to return home with self care at DC.   Action/Plan: Pt will DC after HD today. No CM needs.   Expected Discharge Date:     07/16/2015             Expected Discharge Plan:  Home/Self Care  In-House Referral:  NA  Discharge planning Services  CM Consult  Post Acute Care Choice:  NA Choice offered to:  NA  DME Arranged:    DME Agency:     HH Arranged:    HH Agency:     Status of Service:  Completed, signed off  Medicare Important Message Given:    Date Medicare IM Given:    Medicare IM give by:    Date Additional Medicare IM Given:    Additional Medicare Important Message give by:     If discussed at [place] of Stay Meetings, dates discussed:    Additional Comments:  Sherald Barge, RN 07/16/2015, 3:57 PM

## 2015-07-16 NOTE — Progress Notes (Signed)
Discharge instructions reviewed with patient. Taken to his car via wheelchair. No distress noted.

## 2015-07-16 NOTE — Care Management Important Message (Signed)
Important Message  Patient Details  Name: [name] MRN: [id number] Date of Birth: [birth year]   Medicare Important Message Given:  N/A - LOS <3 / Initial given by admissions    Sherald Barge, RN 07/16/2015, 3:59 PM

## 2015-07-17 ENCOUNTER — Encounter (HOSPITAL_COMMUNITY): Payer: Self-pay

## 2015-07-17 ENCOUNTER — Ambulatory Visit (HOSPITAL_COMMUNITY)
Admission: RE | Admit: 2015-07-17 | Discharge: 2015-07-17 | Disposition: A | Discharge status: Home / Self Care | Payer: Medicare Other | Source: Ambulatory Visit | Attending: Oncology | Admitting: Oncology

## 2015-07-17 DIAGNOSIS — I120 Hypertensive chronic kidney disease with stage 5 chronic kidney disease or end stage renal disease: Secondary | ICD-10-CM | POA: Insufficient documentation

## 2015-07-17 DIAGNOSIS — E559 Vitamin D deficiency, unspecified: Secondary | ICD-10-CM | POA: Diagnosis not present

## 2015-07-17 DIAGNOSIS — Z79899 Other long term (current) drug therapy: Secondary | ICD-10-CM | POA: Diagnosis not present

## 2015-07-17 DIAGNOSIS — N186 End stage renal disease: Secondary | ICD-10-CM | POA: Insufficient documentation

## 2015-07-17 DIAGNOSIS — C9000 Multiple myeloma not having achieved remission: Secondary | ICD-10-CM | POA: Insufficient documentation

## 2015-07-17 DIAGNOSIS — D696 Thrombocytopenia, unspecified: Secondary | ICD-10-CM | POA: Insufficient documentation

## 2015-07-17 DIAGNOSIS — Z992 Dependence on renal dialysis: Secondary | ICD-10-CM | POA: Insufficient documentation

## 2015-07-17 DIAGNOSIS — D61818 Other pancytopenia: Secondary | ICD-10-CM | POA: Diagnosis present

## 2015-07-17 LAB — BASIC METABOLIC PANEL
ANION GAP: 11 (ref 5–15)
BUN: 39 mg/dL — ABNORMAL HIGH (ref 6–20)
CO2: 27 mmol/L (ref 22–32)
Calcium: 7.3 mg/dL — ABNORMAL LOW (ref 8.9–10.3)
Chloride: 100 mmol/L — ABNORMAL LOW (ref 101–111)
Creatinine, Ser: 5.37 mg/dL — ABNORMAL HIGH (ref 0.61–1.24)
GFR calc Af Amer: 12 mL/min — ABNORMAL LOW (ref >60)
GFR, EST NON AFRICAN AMERICAN: 10 mL/min — AB (ref >60)
GLUCOSE: 98 mg/dL (ref 65–99)
POTASSIUM: 3.9 mmol/L (ref 3.5–5.1)
SODIUM: 138 mmol/L (ref 135–145)

## 2015-07-17 LAB — PROTIME-INR
INR: 1.16 (ref 0.00–1.49)
Prothrombin Time: 15 seconds (ref 11.6–15.2)

## 2015-07-17 LAB — CBC
HEMATOCRIT: 28.4 % — AB (ref 39.0–52.0)
HEMOGLOBIN: 9.2 g/dL — AB (ref 13.0–17.0)
MCH: 34.7 pg — ABNORMAL HIGH (ref 26.0–34.0)
MCHC: 32.4 g/dL (ref 30.0–36.0)
MCV: 107.2 fL — ABNORMAL HIGH (ref 78.0–100.0)
Platelets: 21 10*3/uL — CL (ref 150–400)
RBC: 2.65 MIL/uL — ABNORMAL LOW (ref 4.22–5.81)
RDW: 22.3 % — ABNORMAL HIGH (ref 11.5–15.5)
WBC: 1.2 10*3/uL — CL (ref 4.0–10.5)

## 2015-07-17 LAB — BONE MARROW EXAM

## 2015-07-17 LAB — APTT: APTT: 39 s — AB (ref 24–37)

## 2015-07-17 MED ORDER — FENTANYL CITRATE (PF) 100 MCG/2ML IJ SOLN
INTRAMUSCULAR | Status: AC | PRN
  Administered 2015-07-17: 50 ug via INTRAVENOUS

## 2015-07-17 MED ORDER — MIDAZOLAM HCL 2 MG/2ML IJ SOLN
INTRAMUSCULAR | Status: AC
  Filled 2015-07-17: qty 6

## 2015-07-17 MED ORDER — HYDROCODONE-ACETAMINOPHEN 5-325 MG PO TABS: 1.0000 | ORAL_TABLET | ORAL | Status: DC | PRN

## 2015-07-17 MED ORDER — FENTANYL CITRATE (PF) 100 MCG/2ML IJ SOLN
INTRAMUSCULAR | Status: AC
  Filled 2015-07-17: qty 4

## 2015-07-17 MED ORDER — SODIUM CHLORIDE 0.9 % IV SOLN
INTRAVENOUS | Status: DC
Start: 2015-07-17 — End: 2015-07-18
  Administered 2015-07-17: 10:00:00 via INTRAVENOUS

## 2015-07-17 MED ORDER — HEPARIN SOD (PORK) LOCK FLUSH 100 UNIT/ML IV SOLN
500.0000 [IU] | INTRAVENOUS | Status: AC | PRN
  Administered 2015-07-17: 500 [IU]
  Filled 2015-07-17: qty 5

## 2015-07-17 MED ORDER — MIDAZOLAM HCL 2 MG/2ML IJ SOLN
INTRAMUSCULAR | Status: AC | PRN
  Administered 2015-07-17: 2 mg via INTRAVENOUS
  Administered 2015-07-17: 1 mg via INTRAVENOUS

## 2015-07-17 MED ORDER — SODIUM CHLORIDE 0.9% FLUSH
10.0000 mL | INTRAVENOUS | Status: AC | PRN
  Administered 2015-07-17: 10 mL

## 2015-07-17 NOTE — H&P (Signed)
Chief Complaint: Patient was seen in consultation today for CT guided bone marrow biopsy  Referring Physician(s): Baird Cancer  Supervising Physician: Arne Cleveland  History of Present Illness: [name] is a 70 y.o. male with past medical history of hypertension, end-stage renal disease and multiple myeloma with persistent pancytopenia who presents today for CT-guided bone marrow biopsy for further evaluation.  Past Medical History  Diagnosis Date  . Hypertension   . Ruptured cervical disc   . Fall resulting in striking against other object     paralyzed  . Multiple myeloma   . Recurrent multiple myeloma of bone marrow with unknown EBV status ([place])   . Multiple myeloma ([place]) 01/26/2011  . Unspecified vitamin D deficiency 03/06/2013  . Anemia of chronic disease 12/30/2013  . Constipation   . Dialysis patient ([place]) 2016  . Thrombocytopenia ([place]) 08/31/2014  . 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 Constipation     Past Surgical History  Procedure Laterality Date  . Inguinal hernia repair Bilateral   . Anterior cervical decomp/discectomy fusion    . Bone marrow aspirate and biopsy wiith lumbar puncture    . Melanoma excision      rt. breast  . Cervical spine surgery    . Kyphoplasty Bilateral 01/30/2014    Procedure: Thoracic eleven Kyphoplasty;  Surgeon: Consuella Lose, MD;  Location: [facility];  Service: Neurosurgery;  Laterality: Bilateral;  Thoracic eleven Kyphoplasty  . Av fistula placement Right 04/30/2014    Procedure: BRACHIOCEPHALIC ARTERIOVENOUS (AV) FISTULA CREATION;  Surgeon: Conrad Bixby, MD;  Location: [facility];  Service: Vascular;  Laterality: Right;  . Portacath placement Left 02/18/2015    Procedure: INSERTION OF PORT A CATH RIGHT INTERNAL JUGULAR VEIN ;  Surgeon: Conrad Hartley, MD;  Location: [facility];  Service: Vascular;  Laterality:  Left;  . Cataract extraction w/phaco Right 04/15/2015    Procedure: CATARACT EXTRACTION PHACO AND INTRAOCULAR LENS PLACEMENT (IOC);  Surgeon: Tonny Branch, MD;  Location: [facility];  Service: Ophthalmology;  Laterality: Right;  CDE:8.97  . Cataract extraction w/phaco Left 04/29/2015    Procedure: CATARACT EXTRACTION PHACO AND INTRAOCULAR LENS PLACEMENT ; CDE:  7.61;  Surgeon: Tonny Branch, MD;  Location: [facility];  Service: Ophthalmology;  Laterality: Left;    Allergies: Pamidronate  Medications: Prior to Admission medications   Medication Sig Start Date End Date Taking? Authorizing Provider  HYDROcodone-acetaminophen (NORCO/VICODIN) 5-325 MG tablet Take 1 tablet by mouth every 6 (six) hours as needed for severe pain. 06/24/15   Baird Cancer, PA-C  lenalidomide (REVLIMID) 5 MG capsule Take one tablet every other day, after dialysis 06/20/15   Baird Cancer, PA-C  lisinopril (PRINIVIL,ZESTRIL) 20 MG tablet Take 40 mg by mouth every morning.  06/27/15   Historical Provider, MD  LYRICA 75 MG capsule TAKE 1 CAPSULE BY MOUTH TWICE DAILY. 06/12/15   Baird Cancer, PA-C  Omega-3 Fatty Acids (FISH OIL) 1000 MG CAPS Take 2 capsules by mouth daily.    Historical Provider, MD  ondansetron (ZOFRAN) 8 MG tablet Take 1 tablet (8 mg total) by mouth every 8 (eight) hours as needed for nausea or vomiting. 03/15/15   Baird Cancer, PA-C  oxyCODONE-acetaminophen (ROXICET) 5-325 MG tablet Take 1-2 tablets by mouth every 6 (six) hours as needed for moderate pain. 02/18/15   Conrad Cobb, MD  Family History  Problem Relation Age of Onset  . Cancer Sister   . Cancer Brother     Social History   Social History  . Marital Status: Divorced    Spouse Name: N/A  . Number of Children: N/A  . Years of Education: N/A   Social History Main Topics  . Smoking status: Never Smoker   . Smokeless tobacco: Never Used  . Alcohol Use: No  . Drug Use: No  . Sexual Activity: Not on file   Other Topics Concern  .  Not on file   Social History Narrative     Review of Systems  Constitutional: Positive for fatigue. Negative for fever and chills.  Respiratory: Negative for cough and shortness of breath.   Cardiovascular: Negative for chest pain.  Gastrointestinal: Negative for nausea, vomiting, abdominal pain and blood in stool.  Genitourinary: Negative for dysuria and hematuria.  Musculoskeletal: Negative for back pain.  Neurological: Positive for weakness. Negative for headaches.  Hematological: Bruises/bleeds easily.    Vital Signs: Blood pressure 148/100, temperature 97.8, heart rate 60, respirations 18, oxygen saturation 100% room air   Physical Exam  Constitutional: He is oriented to person, place, and time. He appears well-developed and well-nourished.  Cardiovascular:  Irregular rhythm, normal rate; right upper extremity AV graft with good thrill/bruit  Pulmonary/Chest: Effort normal.  Diminished breath sounds at bases  Abdominal:  Obese, positive bowel sounds, nontender  Musculoskeletal: He exhibits edema.  Neurological: He is alert and oriented to person, place, and time.    Mallampati Score:     Imaging: Ct Abdomen Pelvis Wo Contrast  07/15/2015  CLINICAL DATA:  Acute onset of nausea vomiting and diarrhea. Sharp stabbing right upper quadrant abdominal pain. Initial encounter. EXAM: CT ABDOMEN AND PELVIS WITHOUT CONTRAST TECHNIQUE: Multidetector CT imaging of the abdomen and pelvis was performed following the standard protocol without IV contrast. COMPARISON:  PET/CT performed 04/04/2010 FINDINGS: A small right pleural effusion is noted, with associated atelectasis. Minimal interstitial prominence is noted at the lung bases. Diffuse coronary artery calcifications are seen. The liver and spleen are unremarkable in appearance. There is minimal haziness about the gallbladder, of uncertain significance. The gallbladder is otherwise grossly unremarkable on CT. The pancreas and adrenal  glands are unremarkable. Severe chronic bilateral renal atrophy is noted, with a few bilateral renal cysts and minimal nonobstructing stones. Nonspecific perinephric stranding is noted bilaterally. There is no evidence of hydronephrosis. No obstructing ureteral stones are identified. A focus of calcification within the mid abdominal mesentery is likely benign. No free fluid is identified. The small bowel is unremarkable in appearance. The stomach is within normal limits. No acute vascular abnormalities are seen. The appendix is normal in caliber, without evidence of appendicitis. The colon is unremarkable in appearance. The bladder is mildly distended and grossly unremarkable. The prostate remains normal in size, with scattered calcification. No inguinal lymphadenopathy is seen. No acute osseous abnormalities are identified. The patient is status post vertebroplasty at T11. Disc space narrowing is noted at L4-L5. IMPRESSION: 1. Minimal haziness about the gallbladder, of uncertain significance. It is otherwise unremarkable in appearance on CT. If the patient's symptoms persist, right upper quadrant ultrasound could be considered for further evaluation. 2. Small right pleural effusion, with associated atelectasis. Minimal interstitial prominence at the lung bases. 3. Diffuse coronary artery calcifications seen. 4. Severe chronic bilateral renal atrophy, with a few bilateral renal cysts and minimal nonobstructing stones. Electronically Signed   By: Garald Balding M.D.   On:  07/15/2015 00:24   Dg Chest 2 View  07/15/2015  CLINICAL DATA:  Acute onset of diarrhea and vomiting. Initial encounter. EXAM: CHEST  2 VIEW COMPARISON:  Chest radiograph performed 04/24/2015 FINDINGS: The lungs are well-aerated. Vascular congestion is noted, with right perihilar opacities, concerning for pneumonia. Asymmetric interstitial edema might have a similar appearance. A small right pleural effusion is noted. No pneumothorax is seen. The  heart is mildly enlarged. A right-sided chest port is noted ending about the mid SVC. No acute osseous abnormalities are seen. Cervical spinal fusion hardware is noted. IMPRESSION: Vascular congestion and mild cardiomegaly, with right perihilar opacities, concerning for pneumonia. Asymmetric interstitial edema might have a similar appearance. Small right pleural effusion noted. Followup PA and lateral chest X-ray is recommended in 3-4 weeks following completion of treatment, to ensure resolution and exclude underlying malignancy. Electronically Signed   By: Garald Balding M.D.   On: 07/15/2015 00:35    Labs:  CBC:  Recent Labs  07/15/15 0425 07/15/15 2033 07/16/15 0135 07/16/15 0420  WBC 1.3* 1.2* 1.0* 1.1*  HGB 9.6* 9.3* 8.9* 8.8*  HCT 28.8* 28.9* 27.3* 27.3*  PLT 9* 6* 25* 28*    COAGS:  Recent Labs  12/01/14 2319  INR 1.12  APTT 28    BMP:  Recent Labs  07/14/15 2133 07/15/15 0425 07/15/15 2033 07/16/15 0849  NA 137 140 141 138  K 4.2 3.9 4.7 4.2  CL 101 106 105 104  CO2 26 21* 26 24  GLUCOSE 93 82 87 124*  BUN 40* 43* 49* 52*  CALCIUM 7.7* 7.1* 7.2* 6.9*  CREATININE 5.49* 5.84* 6.73* 7.28*  GFRNONAA 10* 9* 8* 7*  GFRAA 11* 10* 9* 8*    LIVER FUNCTION TESTS:  Recent Labs  05/09/15 1603 05/22/15 0922 07/10/15 0930 07/14/15 2133 07/15/15 2033 07/16/15 0849  BILITOT 1.1 0.8 1.2 0.8  --   --   AST 23 19 19 22   --   --   ALT 19 15* 22 24  --   --   ALKPHOS 37* 43 33* 37*  --   --   PROT 6.3* 6.8 6.2* 6.6  --   --   ALBUMIN 3.1* 3.4* 3.2* 3.4* 3.1* 3.1*    TUMOR MARKERS: No results for input(s): AFPTM, CEA, CA199, CHROMGRNA in the last 8760 hours.  Assessment and Plan:  70 y.o. male with past medical history of hypertension, end-stage renal disease and multiple myeloma with persistent pancytopenia who presents today for CT-guided bone marrow biopsy for further evaluation.Risks and benefits discussed with the patient including, but not limited to  bleeding, infection, damage to adjacent structures or low yield requiring additional tests.All of the patient's questions were answered, patient is agreeable to proceed.Consent signed and in chart.      Thank you for this interesting consult.  I greatly enjoyed meeting JAMEIS [place]SHAM and look forward to participating in their care.  A copy of this report was sent to the requesting provider on this date.  Electronically Signed: D. Rowe Robert 07/17/2015, 9:47 AM   I spent a total of 15 minutes in face to face in clinical consultation, greater than 50% of which was counseling/coordinating care for CT-guided bone marrow biopsy

## 2015-07-17 NOTE — Procedures (Signed)
CT-guided  R iliac bone marrow aspiration and core biopsy No complication No blood loss. See complete dictation in [facility] PACS  

## 2015-07-17 NOTE — Discharge Instructions (Signed)
Bone Marrow Aspiration and Bone Marrow Biopsy, Care After Refer to this sheet in the next few weeks. These instructions provide you with information about caring for yourself after your procedure. Your health care provider may also give you more specific instructions. Your treatment has been planned according to current medical practices, but problems sometimes occur. Call your health care provider if you have any problems or questions after your procedure. WHAT TO EXPECT AFTER THE PROCEDURE After your procedure, it is common to have:  Soreness or tenderness around the puncture site.  Bruising. HOME CARE INSTRUCTIONS  Take medicines only as directed by your health care provider.  Follow your health care provider's instructions about:  Puncture site care.  Bandage (dressing) changes and removal.  Bathe and shower as directed by your health care provider.  Check your puncture site every day for signs of infection. Watch for:  Redness, swelling, or pain.  Fluid, blood, or pus.  Return to your normal activities as directed by your health care provider.  Keep all follow-up visits as directed by your health care provider. This is important. SEEK MEDICAL CARE IF:  You have a fever.  You have uncontrollable bleeding.  You have redness, swelling, or pain at the site of your puncture.  You have fluid, blood, or pus coming from your puncture site.   This information is not intended to replace advice given to you by your health care provider. Make sure you discuss any questions you have with your health care provider.   Document Released: 11/21/2004 Document Revised: 09/18/2014 Document Reviewed: 04/25/2014 Elsevier Interactive Patient Education 2016 [employer] Moderate Conscious Sedation, Adult, Care After Refer to this sheet in the next few weeks. These instructions provide you with information on caring for yourself after your procedure. Your health care provider may also give  you more specific instructions. Your treatment has been planned according to current medical practices, but problems sometimes occur. Call your health care provider if you have any problems or questions after your procedure. WHAT TO EXPECT AFTER THE PROCEDURE  After your procedure:  You may feel sleepy, clumsy, and have poor balance for several hours.  Vomiting may occur if you eat too soon after the procedure. HOME CARE INSTRUCTIONS  Do not participate in any activities where you could become injured for at least 24 hours. Do not:  Drive.  Swim.  Ride a bicycle.  Operate heavy machinery.  Cook.  Use power tools.  Climb ladders.  Work from a high place.  Do not make important decisions or sign legal documents until you are improved.  If you vomit, drink water, juice, or soup when you can drink without vomiting. Make sure you have little or no nausea before eating solid foods.  Only take over-the-counter or prescription medicines for pain, discomfort, or fever as directed by your health care provider.  Make sure you and your family fully understand everything about the medicines given to you, including what side effects may occur.  You should not drink alcohol, take sleeping pills, or take medicines that cause drowsiness for at least 24 hours.  If you smoke, do not smoke without supervision.  If you are feeling better, you may resume normal activities 24 hours after you were sedated.  Keep all appointments with your health care provider. SEEK MEDICAL CARE IF:  Your skin is pale or bluish in color.  You continue to feel nauseous or vomit.  Your pain is getting worse and is not helped by medicine.  You have bleeding or swelling. °· You are still sleepy or feeling clumsy after 24 hours. °SEEK IMMEDIATE MEDICAL CARE IF: °· You develop a rash. °· You have difficulty breathing. °· You develop any type of allergic problem. °· You have a fever. °MAKE SURE YOU: °· Understand  these instructions. °· Will watch your condition. °· Will get help right away if you are not doing well or get worse. °  °This information is not intended to replace advice given to you by your health care provider. Make sure you discuss any questions you have with your health care provider. °  °Document Released: 02/22/2013 Document Revised: 05/25/2014 Document Reviewed: 02/22/2013 °Elsevier Interactive Patient Education ©2016 [employer] ° ° °

## 2015-07-17 NOTE — Discharge Instructions (Signed)
Bone Marrow Aspiration and Bone Marrow Biopsy °Bone marrow aspiration and bone marrow biopsy are procedures that are done to diagnose blood disorders. You may also have one of these procedures to help diagnose infections or some types of cancer. °Bone marrow is the soft tissue that is inside your bones. Blood cells are produced in bone marrow. For bone marrow aspiration, a sample of tissue in liquid form is removed from inside your bone. For a bone marrow biopsy, a small core of bone marrow tissue is removed. Then these samples are examined under a microscope or tested in a lab. °You may need these procedures if you have an abnormal complete blood count (CBC). The aspiration or biopsy sample is usually taken from the top of your hip bone. Sometimes, an aspiration sample is taken from your chest bone (sternum). °LET YOUR HEALTH CARE PROVIDER KNOW ABOUT: °· Any allergies you have. °· All medicines you are taking, including vitamins, herbs, eye drops, creams, and over-the-counter medicines. °· Previous problems you or members of your family have had with the use of anesthetics. °· Any blood disorders you have. °· Previous surgeries you have had. °· Any medical conditions you may have. °· Whether you are pregnant or you think that you may be pregnant. °RISKS AND COMPLICATIONS °Generally, this is a safe procedure. However, problems may occur, including: °· Infection. °· Bleeding. °BEFORE THE PROCEDURE °· Ask your health care provider about: °¨ Changing or stopping your regular medicines. This is especially important if you are taking diabetes medicines or blood thinners. °¨ Taking medicines such as aspirin and ibuprofen. These medicines can thin your blood. Do not take these medicines before your procedure if your health care provider instructs you not to. °· Plan to have someone take you home after the procedure. °· If you go home right after the procedure, plan to have someone with you for 24 hours. °PROCEDURE  °· An  IV tube may be inserted into one of your veins. °· The injection site will be cleaned with a germ-killing solution (antiseptic). °· You will be given one or more of the following: °¨ A medicine that helps you relax (sedative). °¨ A medicine that numbs the area (local anesthetic). °· The bone marrow sample will be removed as follows: °¨ For an aspiration, a hollow needle will be inserted through your skin and into your bone. Bone marrow fluid will be drawn up into a syringe. °¨ For a biopsy, your health care provider will use a hollow needle to remove a core of tissue from your bone marrow. °· The needle will be removed. °· A bandage (dressing) will be placed over the insertion site and taped in place. °The procedure may vary among health care providers and hospitals. °AFTER THE PROCEDURE °· Your blood pressure, heart rate, breathing rate, and blood oxygen level will be monitored often until the medicines you were given have worn off. °· Return to your normal activities as directed by your health care provider. °  °This information is not intended to replace advice given to you by your health care provider. Make sure you discuss any questions you have with your health care provider. °  °Document Released: 05/07/2004 Document Revised: 09/18/2014 Document Reviewed: 04/25/2014 °Elsevier Interactive Patient Education ©2016 [employer] °Bone Marrow Aspiration and Bone Marrow Biopsy, Care After °Refer to this sheet in the next few weeks. These instructions provide you with information about caring for yourself after your procedure. Your health care provider may also give you   more specific instructions. Your treatment has been planned according to current medical practices, but problems sometimes occur. Call your health care provider if you have any problems or questions after your procedure. WHAT TO EXPECT AFTER THE PROCEDURE After your procedure, it is common to have:  Soreness or tenderness around the puncture  site.  Bruising. HOME CARE INSTRUCTIONS  Take medicines only as directed by your health care provider.  Follow your health care provider's instructions about:  Puncture site care.  Bandage (dressing) changes and removal.  Bathe and shower as directed by your health care provider.  Check your puncture site every day for signs of infection. Watch for:  Redness, swelling, or pain.  Fluid, blood, or pus.  Return to your normal activities as directed by your health care provider.  Keep all follow-up visits as directed by your health care provider. This is important. SEEK MEDICAL CARE IF:  You have a fever.  You have uncontrollable bleeding.  You have redness, swelling, or pain at the site of your puncture.  You have fluid, blood, or pus coming from your puncture site.   This information is not intended to replace advice given to you by your health care provider. Make sure you discuss any questions you have with your health care provider.   Document Released: 11/21/2004 Document Revised: 09/18/2014 Document Reviewed: 04/25/2014 Elsevier Interactive Patient Education 2016 [employer]. Moderate Conscious Sedation, Adult Sedation is the use of medicines to promote relaxation and relieve discomfort and anxiety. Moderate conscious sedation is a type of sedation. Under moderate conscious sedation you are less alert than normal but are still able to respond to instructions or stimulation. Moderate conscious sedation is used during short medical and dental procedures. It is milder than deep sedation or general anesthesia and allows you to return to your regular activities sooner. LET [facility] CARE PROVIDER KNOW ABOUT:   Any allergies you have.  All medicines you are taking, including vitamins, herbs, eye drops, creams, and over-the-counter medicines.  Use of steroids (by mouth or creams).  Previous problems you or members of your family have had with the use of  anesthetics.  Any blood disorders you have.  Previous surgeries you have had.  Medical conditions you have.  Possibility of pregnancy, if this applies.  Use of cigarettes, alcohol, or illegal drugs. RISKS AND COMPLICATIONS Generally, this is a safe procedure. However, as with any procedure, problems can occur. Possible problems include:  Oversedation.  Trouble breathing on your own. You may need to have a breathing tube until you are awake and breathing on your own.  Allergic reaction to any of the medicines used for the procedure. BEFORE THE PROCEDURE  You may have blood tests done. These tests can help show how well your kidneys and liver are working. They can also show how well your blood clots.  A physical exam will be done.  Only take medicines as directed by your health care provider. You may need to stop taking medicines (such as blood thinners, aspirin, or nonsteroidal anti-inflammatory drugs) before the procedure.   Do not eat or drink at least 6 hours before the procedure or as directed by your health care provider.  Arrange for a responsible adult, family member, or friend to take you home after the procedure. He or she should stay with you for at least 24 hours after the procedure, until the medicine has worn off. PROCEDURE   An intravenous (IV) catheter will be inserted into one of your  veins. Medicine will be able to flow directly into your body through this catheter. You may be given medicine through this tube to help prevent pain and help you relax.  The medical or dental procedure will be done. AFTER THE PROCEDURE  You will stay in a recovery area until the medicine has worn off. Your blood pressure and pulse will be checked.   Depending on the procedure you had, you may be allowed to go home when you can tolerate liquids and your pain is under control.   This information is not intended to replace advice given to you by your health care provider. Make  sure you discuss any questions you have with your health care provider.   Document Released: 01/27/2001 Document Revised: 05/25/2014 Document Reviewed: 01/09/2013 Elsevier Interactive Patient Education [employer].

## 2015-07-21 NOTE — Progress Notes (Signed)
Rescheduled

## 2015-07-21 NOTE — Assessment & Plan Note (Deleted)
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 Marcell Anger (retired) with the second bone marrow transplant occuring in July 2013. Recent bone marrow demonstrates 5% plasma cells and he was seen by Dr. Norma Fredrickson who recommended starting Revlimid at a dose of 5 mg 21/28 days. His Revlimid dosing has been complicated by pruritic rash and [name] has decided to take Revlimid every other day which he is tolerating well.  Will continue to try to increase the dose in the future.  Chart reviewed.  He underwent bone marrow aspiration and biopsy on 07/17/2015.

## 2015-07-22 ENCOUNTER — Ambulatory Visit (HOSPITAL_COMMUNITY): Payer: Medicare Other | Admitting: Oncology

## 2015-07-22 ENCOUNTER — Other Ambulatory Visit (HOSPITAL_COMMUNITY): Payer: Medicare Other

## 2015-07-23 ENCOUNTER — Encounter (HOSPITAL_COMMUNITY): Payer: Medicare Other

## 2015-07-24 ENCOUNTER — Encounter (HOSPITAL_COMMUNITY): Payer: Medicare Other

## 2015-07-24 ENCOUNTER — Other Ambulatory Visit (HOSPITAL_COMMUNITY): Payer: Medicare Other

## 2015-07-24 ENCOUNTER — Encounter (HOSPITAL_COMMUNITY): Payer: Medicare Other | Attending: Oncology

## 2015-07-24 ENCOUNTER — Ambulatory Visit (HOSPITAL_COMMUNITY): Payer: Medicare Other | Admitting: Oncology

## 2015-07-24 ENCOUNTER — Encounter (HOSPITAL_BASED_OUTPATIENT_CLINIC_OR_DEPARTMENT_OTHER): Payer: Medicare Other | Admitting: Oncology

## 2015-07-24 DIAGNOSIS — C9000 Multiple myeloma not having achieved remission: Secondary | ICD-10-CM | POA: Diagnosis present

## 2015-07-24 DIAGNOSIS — Z992 Dependence on renal dialysis: Secondary | ICD-10-CM

## 2015-07-24 DIAGNOSIS — Z9481 Bone marrow transplant status: Secondary | ICD-10-CM

## 2015-07-24 LAB — COMPREHENSIVE METABOLIC PANEL
ALK PHOS: 38 U/L (ref 38–126)
ALT: 19 U/L (ref 17–63)
ANION GAP: 7 (ref 5–15)
AST: 18 U/L (ref 15–41)
Albumin: 3.1 g/dL — ABNORMAL LOW (ref 3.5–5.0)
BILIRUBIN TOTAL: 1.2 mg/dL (ref 0.3–1.2)
BUN: 30 mg/dL — ABNORMAL HIGH (ref 6–20)
CALCIUM: 8 mg/dL — AB (ref 8.9–10.3)
CO2: 27 mmol/L (ref 22–32)
Chloride: 103 mmol/L (ref 101–111)
Creatinine, Ser: 4.91 mg/dL — ABNORMAL HIGH (ref 0.61–1.24)
GFR, EST AFRICAN AMERICAN: 13 mL/min — AB (ref >60)
GFR, EST NON AFRICAN AMERICAN: 11 mL/min — AB (ref >60)
GLUCOSE: 97 mg/dL (ref 65–99)
Potassium: 4.1 mmol/L (ref 3.5–5.1)
Sodium: 137 mmol/L (ref 135–145)
TOTAL PROTEIN: 6.1 g/dL — AB (ref 6.5–8.1)

## 2015-07-24 LAB — CBC
HEMATOCRIT: 25.9 % — AB (ref 39.0–52.0)
HEMOGLOBIN: 8.7 g/dL — AB (ref 13.0–17.0)
MCH: 36.3 pg — AB (ref 26.0–34.0)
MCHC: 33.6 g/dL (ref 30.0–36.0)
MCV: 107.9 fL — AB (ref 78.0–100.0)
Platelets: 8 10*3/uL — CL (ref 150–400)
RBC: 2.4 MIL/uL — ABNORMAL LOW (ref 4.22–5.81)
RDW: 22.9 % — ABNORMAL HIGH (ref 11.5–15.5)
WBC: 1.1 10*3/uL — CL (ref 4.0–10.5)

## 2015-07-24 LAB — CHROMOSOME ANALYSIS, BONE MARROW

## 2015-07-24 LAB — TISSUE HYBRIDIZATION (BONE MARROW)-NCBH

## 2015-07-24 MED ORDER — HEPARIN SOD (PORK) LOCK FLUSH 100 UNIT/ML IV SOLN
INTRAVENOUS | Status: AC
  Filled 2015-07-24: qty 5

## 2015-07-24 MED ORDER — SODIUM CHLORIDE 0.9 % IV SOLN
250.0000 mL | Freq: Once | INTRAVENOUS | Status: AC
  Administered 2015-07-24: 250 mL via INTRAVENOUS

## 2015-07-24 MED ORDER — ACETAMINOPHEN 325 MG PO TABS
650.0000 mg | ORAL_TABLET | Freq: Once | ORAL | Status: AC
  Administered 2015-07-24: 650 mg via ORAL
  Filled 2015-07-24: qty 2

## 2015-07-24 MED ORDER — SODIUM CHLORIDE 0.9% FLUSH
10.0000 mL | INTRAVENOUS | Status: AC | PRN
Start: 2015-07-24 — End: 2015-07-24
  Administered 2015-07-24: 10 mL

## 2015-07-24 MED ORDER — HEPARIN SOD (PORK) LOCK FLUSH 100 UNIT/ML IV SOLN
500.0000 [IU] | Freq: Every day | INTRAVENOUS | Status: AC | PRN
  Administered 2015-07-24: 500 [IU]
  Filled 2015-07-24: qty 5

## 2015-07-24 MED ORDER — DIPHENHYDRAMINE HCL 25 MG PO CAPS
25.0000 mg | ORAL_CAPSULE | Freq: Once | ORAL | Status: AC
  Administered 2015-07-24: 25 mg via ORAL
  Filled 2015-07-24: qty 1

## 2015-07-24 NOTE — Progress Notes (Signed)
CRITICAL VALUE ALERT  Critical value received:  WBC 1.1; Platelets 8,000  Date of notification:  07/24/15  Time of notification:  1227  Critical value read back:Yes.    Nurse who received alert:  A. Ouida Sills, RN  MD notified:  Kirby Crigler, PA-C at 1230  Orders received to transfuse one unit of platelets (no special requirements for blood product per PA).

## 2015-07-24 NOTE — Progress Notes (Addendum)
No PCP Per Patient No address on file  Multiple myeloma not having achieved remission ([place]) - Plan: CBC with Differential, Sample to Blood Bank, Comprehensive metabolic panel, Lactate dehydrogenase, Sedimentation rate, Kappa/lambda light chains, Beta 2 microglobuline, serum, IgG, IgA, IgM, Immunofixation electrophoresis, Protein electrophoresis, serum  CURRENT THERAPY: REVLIMID ON HOLD, previously on Revlimid 5 mg every other day (patient refusing an increase).  He underwent bone marrow bx on 07/17/2015.    INTERVAL HISTORY: [name] 70 y.o. male returns for followup of multiple myeloma, not active at this time, S/P 2 autologous BM transplant at [facility] under the guidance of Dr. Marcell Anger (retired) with the second bone marrow transplant occuring in July 2013. Recent bone marrow demonstrates 5% plasma cells and he was seen by Dr. Norma Fredrickson who recommended starting Revlimid at a dose of 5 mg 21/28 days. His Revlimid dosing has been complicated by pruritic rash and Aristides has decided to take Revlimid every other day which he is tolerating well.  Will try to increase the dose in the future.    Multiple myeloma ([place])   01/26/2011 Initial Diagnosis Multiple myeloma   09/10/2014 - 09/14/2014 Chemotherapy Revlimid 5 mg days 1-21 every 28 days   09/14/2014 Adverse Reaction Rash, Revlimid versus Doxycycline   09/21/2014 - 09/23/2014 Chemotherapy Revlimid 5 mg days 1-21 every 28 days- rechallenge.   09/24/2014 Adverse Reaction Rash.  Revlimid-induced   10/22/2014 - 07/24/2015 Chemotherapy Revlimid 5 mg every other day.   07/14/2015 - 07/16/2015 Hospital Admission Nausea with vomiting secondary to viral gastroenteritis.   07/17/2015 Bone Marrow Biopsy By IR   07/17/2015 Pathology Results VARIABLY CELLULAR BONE MARROW WITH RELATIVE ABUNDANCE OF PLASMA CELLS. - GRANULOCYTIC AND MEGAKARYOCYTIC HYPOPLASIA   07/17/2015 Pathology Results The bone marrow is variably cellular but many aspirate particles are hypocellular  with relative abundance of plasma cells representing 18% of all cells in the aspirate associated with interstitial cells and small clusters in the clot and biopsy sections   07/17/2015 Pathology Results Immunohistochemical stains show polyclonal staining pattern for kappa and lambda light chains in plasma cells. The latter findings are considered nonspecific and not diagnostic of plasma cell neoplasm/dyscrasia.   07/17/2015 Pathology Results The myeloid findings are nonspecific and may be regenerative as a result of chemotherapy/medication, infection, alcohol, renal disease etc.   07/17/2015 Pathology Results Normal cytogenetics.   07/24/2015 Treatment Plan Change Drug Holiday x 1 month; discussed with Dr. Norma Fredrickson [facility]    I personally reviewed and went over laboratory results with the patient.  The results are noted within this dictation.  I personally reviewed and went over pathology results with the patient.  He underwent bone marrow aspiration and biopsy in IR on 07/17/2015.Results are dictated below.  He continues with hemodialysis and has complications of blood loss associated with that.  Of late, he's needed more transfusions. These are outlined below: PLTS: 07/24/2015 07/15/2015 07/08/2015 06/21/2015 06/05/2015 12/23/201 04/26/2015  RBC: 07/10/2015 06/21/2015 06/05/2015 05/03/2015 04/10/2015  I discussed this case with Dr. Norma Fredrickson [facility]  He recommended sending the bone marrow aspiration and biopsy to his hematopathologist for a second read in addition to holding the patient's Revlimid for 1 month. He wishes to see Jethro approximately 1 month after his bone marrow was read by his hematopathologist.  Additionally, he wishes I sent him the most recent bone marrow aspiration and biopsy report in addition to recent laboratory work. I will fax these to his office.  Past Medical History  Diagnosis  Date  . Hypertension   . Ruptured cervical disc   . Fall resulting in striking  against other object     paralyzed  . Multiple myeloma   . Recurrent multiple myeloma of bone marrow with unknown EBV status ([place])   . Multiple myeloma ([place]) 01/26/2011  . Unspecified vitamin D deficiency 03/06/2013  . Anemia of chronic disease 12/30/2013  . Constipation   . Dialysis patient ([place]) 2016  . Thrombocytopenia ([place]) 08/31/2014  . 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 Constipation     has Essential hypertension, benign; BRADYCARDIA; Multiple myeloma ([place]); Intravenous pamidronate causing adverse effect in therapeutic use; Unspecified vitamin D deficiency; Lymphedema of lower extremity; Anemia of chronic disease; Chronic renal disease, stage 5, glomerular filtration rate less than or equal to 15 mL/min/1.73 square meter ([place]); Pathologic fracture; Anemia in chronic kidney disease; Thrombocytopenia ([place]); Bleeding; Anemia; Weakness generalized; ESRD on dialysis ([place]); Anemia, chronic renal failure; Nausea vomiting and diarrhea; Pancytopenia ([place]); Elevated troponin; Chronic CHF (congestive heart failure) ([place]); Abnormal EKG; ESRD on hemodialysis (HCC); and Pleural effusion on right on his problem list.     is allergic to pamidronate.  Current Outpatient Prescriptions on File Prior to Visit  Medication Sig Dispense Refill  . HYDROcodone-acetaminophen (NORCO/VICODIN) 5-325 MG tablet Take 1 tablet by mouth every 6 (six) hours as needed for severe pain. 60 tablet 0  . lenalidomide (REVLIMID) 5 MG capsule Take one tablet every other day, after dialysis 14 capsule 1  . lisinopril (PRINIVIL,ZESTRIL) 20 MG tablet Take 40 mg by mouth every morning.     Marland Kitchen LYRICA 75 MG capsule TAKE 1 CAPSULE BY MOUTH TWICE DAILY. 60 capsule 1  . Omega-3 Fatty Acids (FISH OIL) 1000 MG CAPS Take 2 capsules by mouth daily.    . ondansetron (ZOFRAN) 8 MG tablet Take 1 tablet (8 mg total) by mouth every 8  (eight) hours as needed for nausea or vomiting. 30 tablet 1  . oxyCODONE-acetaminophen (ROXICET) 5-325 MG tablet Take 1-2 tablets by mouth every 6 (six) hours as needed for moderate pain. 10 tablet 0   No current facility-administered medications on file prior to visit.    Past Surgical History  Procedure Laterality Date  . Inguinal hernia repair Bilateral   . Anterior cervical decomp/discectomy fusion    . Bone marrow aspirate and biopsy wiith lumbar puncture    . Melanoma excision      rt. breast  . Cervical spine surgery    . Kyphoplasty Bilateral 01/30/2014    Procedure: Thoracic eleven Kyphoplasty;  Surgeon: Consuella Lose, MD;  Location: [facility];  Service: Neurosurgery;  Laterality: Bilateral;  Thoracic eleven Kyphoplasty  . Av fistula placement Right 04/30/2014    Procedure: BRACHIOCEPHALIC ARTERIOVENOUS (AV) FISTULA CREATION;  Surgeon: Conrad [place], MD;  Location: [place];  Service: Vascular;  Laterality: Right;  . Portacath placement Left 02/18/2015    Procedure: INSERTION OF PORT A CATH RIGHT INTERNAL JUGULAR VEIN ;  Surgeon: Conrad Isabela, MD;  Location: [place];  Service: Vascular;  Laterality: Left;  . Cataract extraction w/phaco Right 04/15/2015    Procedure: CATARACT EXTRACTION PHACO AND INTRAOCULAR LENS PLACEMENT (IOC);  Surgeon: Tonny Branch, MD;  Location: [facility];  Service: Ophthalmology;  Laterality: Right;  CDE:8.97  . Cataract extraction w/phaco Left 04/29/2015    Procedure: CATARACT EXTRACTION PHACO AND INTRAOCULAR LENS PLACEMENT ; CDE:  7.61;  Surgeon: Tonny Branch, MD;  Location: [facility];  Service: Ophthalmology;  Laterality: Left;    Denies any headaches, dizziness, double vision, fevers, chills, night sweats, nausea, vomiting, diarrhea, constipation, chest pain, heart palpitations, shortness of breath, blood in stool, black tarry stool, urinary pain, urinary burning, urinary frequency, hematuria.   PHYSICAL EXAMINATION  ECOG PERFORMANCE STATUS: 1 - Symptomatic but  completely ambulatory  There were no vitals filed for this visit.  GENERAL:alert, no distress, well nourished, well developed, comfortable, cooperative, obese and smiling, unaccompanied today, laying in a chemotherapy bed receiving platelet transfusion. SKIN: skin color, texture, turgor are normal, no rashes or significant lesions HEAD: Normocephalic, No masses, lesions, tenderness or abnormalities EYES: normal, PERRLA, EOMI, Conjunctiva are pink and non-injected EARS: External ears normal OROPHARYNX:lips, buccal mucosa, and tongue normal and mucous membranes are moist  NECK: supple, trachea midline LYMPH:  not examined BREAST:not examined CARDIAC: RRR without murmur, rub, or gallop. LUNGS: CTA B/L without wheezing, rales, or rhonchi. ABDOMEN: + BS x 4 quadrants.  Soft, nontender. BACK: Back symmetric, no curvature. EXTREMITIES:less then 2 second capillary refill, no joint deformities, effusion, or inflammation, no skin discoloration, L AC ecchymosis secondary to venipunctures from recent hospitalization for a viral gastroenteritis. NEURO: alert & oriented x 3 with fluent speech, no focal motor/sensory deficits, gait normal   LABORATORY DATA: CBC    Component Value Date/Time   WBC 1.1* 07/24/2015 1138   RBC 2.40* 07/24/2015 1138   HGB 8.7* 07/24/2015 1138   HCT 25.9* 07/24/2015 1138   PLT 8* 07/24/2015 1138   MCV 107.9* 07/24/2015 1138   MCH 36.3* 07/24/2015 1138   MCHC 33.6 07/24/2015 1138   RDW 22.9* 07/24/2015 1138   LYMPHSABS 1.1 07/14/2015 2133   MONOABS 0.1 07/14/2015 2133   EOSABS 0.0 07/14/2015 2133   BASOSABS 0.0 07/14/2015 2133      Chemistry      Component Value Date/Time   NA 137 07/24/2015 1138   K 4.1 07/24/2015 1138   CL 103 07/24/2015 1138   CO2 27 07/24/2015 1138   BUN 30* 07/24/2015 1138   CREATININE 4.91* 07/24/2015 1138   CREATININE 43.09* 01/26/2012 1052      Component Value Date/Time   CALCIUM 8.0* 07/24/2015 1138   ALKPHOS 38 07/24/2015 1138    AST 18 07/24/2015 1138   ALT 19 07/24/2015 1138   BILITOT 1.2 07/24/2015 1138     Lab Results  Component Value Date   PROT 6.1* 07/24/2015   ALBUMINELP 3.6 05/22/2015   A1GS 0.2 05/22/2015   A2GS 0.4 05/22/2015   BETS 1.0 05/22/2015   BETA2SER 4.1 05/16/2014   GAMS 1.0 05/22/2015   MSPIKE Not Observed 05/22/2015   SPEI Comment 05/22/2015   SPECOM Comment 05/22/2015   IGGSERUM 865 05/22/2015   IGGSERUM 887 05/22/2015   IGA 352 05/22/2015   IGA 365 05/22/2015   IGMSERUM 78 05/22/2015   IGMSERUM 81 05/22/2015   IMMELINT (NOTE) 05/16/2014   KPAFRELGTCHN 230.44* 05/22/2015   LAMBDASER 80.78* 05/22/2015   KAPLAMBRATIO 2.85* 05/22/2015    PENDING LABS:   RADIOGRAPHIC STUDIES:  Ct Abdomen Pelvis Wo Contrast  07/15/2015  CLINICAL DATA:  Acute onset of nausea vomiting and diarrhea. Sharp stabbing right upper quadrant abdominal pain. Initial encounter. EXAM: CT ABDOMEN AND PELVIS WITHOUT CONTRAST TECHNIQUE: Multidetector CT imaging of the abdomen and pelvis was performed following the standard protocol without IV contrast. COMPARISON:  PET/CT performed 04/04/2010 FINDINGS: A small right pleural effusion is noted, with associated atelectasis.  Minimal interstitial prominence is noted at the lung bases. Diffuse coronary artery calcifications are seen. The liver and spleen are unremarkable in appearance. There is minimal haziness about the gallbladder, of uncertain significance. The gallbladder is otherwise grossly unremarkable on CT. The pancreas and adrenal glands are unremarkable. Severe chronic bilateral renal atrophy is noted, with a few bilateral renal cysts and minimal nonobstructing stones. Nonspecific perinephric stranding is noted bilaterally. There is no evidence of hydronephrosis. No obstructing ureteral stones are identified. A focus of calcification within the mid abdominal mesentery is likely benign. No free fluid is identified. The small bowel is unremarkable in appearance. The  stomach is within normal limits. No acute vascular abnormalities are seen. The appendix is normal in caliber, without evidence of appendicitis. The colon is unremarkable in appearance. The bladder is mildly distended and grossly unremarkable. The prostate remains normal in size, with scattered calcification. No inguinal lymphadenopathy is seen. No acute osseous abnormalities are identified. The patient is status post vertebroplasty at T11. Disc space narrowing is noted at L4-L5. IMPRESSION: 1. Minimal haziness about the gallbladder, of uncertain significance. It is otherwise unremarkable in appearance on CT. If the patient's symptoms persist, right upper quadrant ultrasound could be considered for further evaluation. 2. Small right pleural effusion, with associated atelectasis. Minimal interstitial prominence at the lung bases. 3. Diffuse coronary artery calcifications seen. 4. Severe chronic bilateral renal atrophy, with a few bilateral renal cysts and minimal nonobstructing stones. Electronically Signed   By: Garald Balding M.D.   On: 07/15/2015 00:24   Dg Chest 2 View  07/15/2015  CLINICAL DATA:  Acute onset of diarrhea and vomiting. Initial encounter. EXAM: CHEST  2 VIEW COMPARISON:  Chest radiograph performed 04/24/2015 FINDINGS: The lungs are well-aerated. Vascular congestion is noted, with right perihilar opacities, concerning for pneumonia. Asymmetric interstitial edema might have a similar appearance. A small right pleural effusion is noted. No pneumothorax is seen. The heart is mildly enlarged. A right-sided chest port is noted ending about the mid SVC. No acute osseous abnormalities are seen. Cervical spinal fusion hardware is noted. IMPRESSION: Vascular congestion and mild cardiomegaly, with right perihilar opacities, concerning for pneumonia. Asymmetric interstitial edema might have a similar appearance. Small right pleural effusion noted. Followup PA and lateral chest X-ray is recommended in 3-4  weeks following completion of treatment, to ensure resolution and exclude underlying malignancy. Electronically Signed   By: Garald Balding M.D.   On: 07/15/2015 00:35   Ct Biopsy  07/17/2015  CLINICAL DATA:  Multiple myeloma EXAM: CT GUIDED DEEP ILIAC BONE ASPIRATION AND CORE BIOPSY TECHNIQUE: Patient was placed supine on the CT gantry and limited axial scans through the pelvis were obtained. Appropriate skin entry site was identified. Skin site was marked, prepped with Betadine, draped in usual sterile fashion, and infiltrated locally with 1% lidocaine. Intravenous Fentanyl and Versed were administered as conscious sedation during continuous monitoring of the patient's level of consciousness and physiological / cardiorespiratory status by the radiology RN, with a total moderate sedation time of 7 minutes. Under CT fluoroscopic guidance an 11-gauge Cook trocar bone needle was advanced into the right iliac bone just lateral to the sacroiliac joint. Once needle tip position was confirmed, coaxial core and aspiration samples were obtained. The final sample was obtained using the guiding needle itself, which was then removed. Post procedure scans show no hematoma or fracture. Patient tolerated procedure well. COMPLICATIONS: COMPLICATIONS none IMPRESSION: 1. Technically successful CT guided right iliac bone core and aspiration biopsy. Electronically Signed  By: Lucrezia Europe M.D.   On: 07/17/2015 11:05     PATHOLOGY:      ASSESSMENT AND PLAN:  No problem-specific assessment & plan notes found for this encounter.  THERAPY PLAN:  Continue Revlimid every other day.  Will try to increase dose to 21/28 days in the future.  All questions were answered. The patient knows to call the clinic with any problems, questions or concerns. We can certainly see the patient much sooner if necessary.  Patient and plan discussed with Dr. Ancil Linsey and she is in agreement with the aforementioned.   This note is  electronically signed by: Doy Mince 07/24/2015 4:18 PM

## 2015-07-24 NOTE — Patient Instructions (Signed)
[place] at [facility] Discharge Instructions  RECOMMENDATIONS MADE BY THE CONSULTANT AND ANY TEST RESULTS WILL BE SENT TO YOUR REFERRING PHYSICIAN.  Today you received a platelet transfusion as ordered.  Thank you for choosing [place] at [facility] to provide your oncology and hematology care.  To afford each patient quality time with our provider, please arrive at least 15 minutes before your scheduled appointment time.   Beginning January 23rd 2017 lab work for the [facility] will be done in the  Main lab at [facility] on 1st floor. If you have a lab appointment with the [facility] please come in thru the  Main Entrance and check in at the main information desk  You need to re-schedule your appointment should you arrive 10 or more minutes late.  We strive to give you quality time with our providers, and arriving late affects you and other patients whose appointments are after yours.  Also, if you no show three or more times for appointments you may be dismissed from the clinic at the providers discretion.     Again, thank you for choosing [facility].  Our hope is that these requests will decrease the amount of time that you wait before being seen by our physicians.       _____________________________________________________________  Should you have questions after your visit to [facility], please contact our office at [phone] between the hours of 8:30 a.m. and 4:30 p.m.  Voicemails left after 4:30 p.m. will not be returned until the following business day.  For prescription refill requests, have your pharmacy contact our office.         Resources For Cancer Patients and their Caregivers ? American Cancer Society: Can assist with transportation, wigs, general needs, runs Look Good Feel Better.        (757)335-9943 ? Cancer Care: Provides financial assistance, online support groups,  medication/co-pay assistance.  1-800-813-HOPE (949) 070-8153) ? [place] Assists [place] cancer patients and their families through emotional , educational and financial support.  574-636-5439 ? [place] DSS Where to apply for food stamps, Medicaid and utility assistance. (973) 763-8411 ? [facility]: Transportation to medical appointments. (657)215-3881 ? Social Security Administration: May apply for disability if have a Stage IV cancer. (978)884-0518 276-325-7223 ? [employer], Disability and Transit Services: Assists with nutrition, care and transit needs. (413)381-4255

## 2015-07-24 NOTE — Progress Notes (Signed)
Tolerated platelet transfusion well. Stable and ambulatory on discharge home to self.

## 2015-07-24 NOTE — Assessment & Plan Note (Addendum)
Multiple myeloma, not currently active, S/P 2 autologous BM transplant at [facility] under the guidance of Dr. Marcell Anger (retired) with the second bone marrow transplant occuring in July 2013. Recent bone marrow demonstrates 5% plasma cells and he was seen by Dr. Norma Fredrickson who recommended starting Revlimid at a dose of 5 mg 21/28 days. His Revlimid dosing has been complicated by pruritic rash and [name] has decided to take Revlimid every other day which he is tolerating well.  Will continue to try to increase the dose in the future.  Oncology history updated.  He is S/P bone marrow aspiration and biopsy by IR on 07/17/2015.  Pathology is noted above.  Cytogenetics are normal and not viewable in [facility] yet.    Case discussed with Dr. Norma Fredrickson at [facility].  He recommends a drug holiday, in addition to a second read by his hematopathologist regarding the patient's bone marrow.  He also requests I send him via fax, recent lab results in addition to other information.  I will take care of this.  He would like to see [name] in about 1 month as a result.  This information is passed along to [place].  I have documented his most recent transfusion needs:  PLTS: 07/24/2015 07/15/2015 07/08/2015 06/21/2015 06/05/2015 12/23/201 04/26/2015  RBC: 07/10/2015 06/21/2015 06/05/2015 05/03/2015 04/10/2015  He will continue with CBC's every 1-2 weeks.  We will perform monthly labs consisting of: CBC diff, CMET, LDH, ESR, CRP, SPEP with IFE, light chain assay, and B2M.  He will return in ~ 1 month for follow-up.

## 2015-07-25 ENCOUNTER — Other Ambulatory Visit (HOSPITAL_COMMUNITY): Payer: Self-pay | Admitting: Oncology

## 2015-07-25 DIAGNOSIS — C9000 Multiple myeloma not having achieved remission: Secondary | ICD-10-CM

## 2015-07-25 LAB — PREPARE PLATELET PHERESIS: UNIT DIVISION: 0

## 2015-07-25 MED ORDER — LENALIDOMIDE 5 MG PO CAPS: ORAL_CAPSULE | ORAL | Status: DC

## 2015-07-29 ENCOUNTER — Encounter (HOSPITAL_BASED_OUTPATIENT_CLINIC_OR_DEPARTMENT_OTHER): Payer: Medicare Other

## 2015-07-29 ENCOUNTER — Encounter (HOSPITAL_COMMUNITY): Payer: Self-pay | Admitting: Emergency Medicine

## 2015-07-29 ENCOUNTER — Observation Stay (HOSPITAL_COMMUNITY)
Admission: EM | Admit: 2015-07-29 | Discharge: 2015-07-30 | Disposition: A | Discharge status: Home / Self Care | Payer: Medicare Other | Attending: Internal Medicine | Admitting: Internal Medicine

## 2015-07-29 ENCOUNTER — Encounter (HOSPITAL_COMMUNITY): Payer: Self-pay

## 2015-07-29 ENCOUNTER — Encounter (HOSPITAL_COMMUNITY): Payer: Self-pay | Admitting: Internal Medicine

## 2015-07-29 DIAGNOSIS — Z992 Dependence on renal dialysis: Secondary | ICD-10-CM | POA: Diagnosis not present

## 2015-07-29 DIAGNOSIS — Z8579 Personal history of other malignant neoplasms of lymphoid, hematopoietic and related tissues: Secondary | ICD-10-CM | POA: Insufficient documentation

## 2015-07-29 DIAGNOSIS — Z79891 Long term (current) use of opiate analgesic: Secondary | ICD-10-CM | POA: Diagnosis not present

## 2015-07-29 DIAGNOSIS — Z79899 Other long term (current) drug therapy: Secondary | ICD-10-CM | POA: Diagnosis not present

## 2015-07-29 DIAGNOSIS — D649 Anemia, unspecified: Secondary | ICD-10-CM | POA: Diagnosis not present

## 2015-07-29 DIAGNOSIS — D696 Thrombocytopenia, unspecified: Secondary | ICD-10-CM | POA: Diagnosis not present

## 2015-07-29 DIAGNOSIS — N185 Chronic kidney disease, stage 5: Secondary | ICD-10-CM | POA: Insufficient documentation

## 2015-07-29 DIAGNOSIS — R799 Abnormal finding of blood chemistry, unspecified: Secondary | ICD-10-CM | POA: Diagnosis present

## 2015-07-29 DIAGNOSIS — N186 End stage renal disease: Secondary | ICD-10-CM

## 2015-07-29 DIAGNOSIS — Z452 Encounter for adjustment and management of vascular access device: Secondary | ICD-10-CM | POA: Diagnosis not present

## 2015-07-29 DIAGNOSIS — N189 Chronic kidney disease, unspecified: Secondary | ICD-10-CM

## 2015-07-29 DIAGNOSIS — C9000 Multiple myeloma not having achieved remission: Secondary | ICD-10-CM | POA: Diagnosis present

## 2015-07-29 DIAGNOSIS — D631 Anemia in chronic kidney disease: Secondary | ICD-10-CM | POA: Diagnosis present

## 2015-07-29 DIAGNOSIS — I120 Hypertensive chronic kidney disease with stage 5 chronic kidney disease or end stage renal disease: Secondary | ICD-10-CM | POA: Diagnosis not present

## 2015-07-29 LAB — CBC WITH DIFFERENTIAL/PLATELET
BASOS ABS: 0 10*3/uL (ref 0.0–0.1)
BASOS PCT: 0 %
EOS ABS: 0 10*3/uL (ref 0.0–0.7)
Eosinophils Relative: 1 %
HCT: 23.1 % — ABNORMAL LOW (ref 39.0–52.0)
HEMOGLOBIN: 7.4 g/dL — AB (ref 13.0–17.0)
Lymphocytes Relative: 57 %
Lymphs Abs: 0.7 10*3/uL (ref 0.7–4.0)
MCH: 35.6 pg — ABNORMAL HIGH (ref 26.0–34.0)
MCHC: 32 g/dL (ref 30.0–36.0)
MCV: 111.1 fL — ABNORMAL HIGH (ref 78.0–100.0)
MONO ABS: 0.1 10*3/uL (ref 0.1–1.0)
Monocytes Relative: 6 %
NEUTROS ABS: 0.4 10*3/uL — AB (ref 1.7–7.7)
NEUTROS PCT: 37 %
PLATELETS: 7 10*3/uL — AB (ref 150–400)
RBC: 2.08 MIL/uL — ABNORMAL LOW (ref 4.22–5.81)
RDW: 23 % — ABNORMAL HIGH (ref 11.5–15.5)
WBC: 1 10*3/uL — CL (ref 4.0–10.5)

## 2015-07-29 LAB — COMPREHENSIVE METABOLIC PANEL
ALBUMIN: 3.3 g/dL — AB (ref 3.5–5.0)
ALK PHOS: 35 U/L — AB (ref 38–126)
ALT: 24 U/L (ref 17–63)
ANION GAP: 7 (ref 5–15)
AST: 22 U/L (ref 15–41)
BUN: 44 mg/dL — ABNORMAL HIGH (ref 6–20)
CALCIUM: 9.6 mg/dL (ref 8.9–10.3)
CHLORIDE: 104 mmol/L (ref 101–111)
CO2: 28 mmol/L (ref 22–32)
Creatinine, Ser: 6.27 mg/dL — ABNORMAL HIGH (ref 0.61–1.24)
GFR calc Af Amer: 10 mL/min — ABNORMAL LOW (ref >60)
GFR calc non Af Amer: 8 mL/min — ABNORMAL LOW (ref >60)
GLUCOSE: 120 mg/dL — AB (ref 65–99)
POTASSIUM: 4 mmol/L (ref 3.5–5.1)
SODIUM: 139 mmol/L (ref 135–145)
Total Bilirubin: 0.9 mg/dL (ref 0.3–1.2)
Total Protein: 6.4 g/dL — ABNORMAL LOW (ref 6.5–8.1)

## 2015-07-29 LAB — LACTATE DEHYDROGENASE: LDH: 273 U/L — ABNORMAL HIGH (ref 98–192)

## 2015-07-29 LAB — SEDIMENTATION RATE: SED RATE: 20 mm/h — AB (ref 0–16)

## 2015-07-29 LAB — PREPARE RBC (CROSSMATCH)

## 2015-07-29 MED ORDER — HEPARIN SOD (PORK) LOCK FLUSH 100 UNIT/ML IV SOLN
500.0000 [IU] | Freq: Once | INTRAVENOUS | Status: AC
  Administered 2015-07-29: 500 [IU] via INTRAVENOUS

## 2015-07-29 MED ORDER — PREGABALIN 75 MG PO CAPS: 75.0000 mg | ORAL_CAPSULE | Freq: Every day | ORAL | Status: DC

## 2015-07-29 MED ORDER — SODIUM CHLORIDE 0.9 % IV SOLN
Freq: Once | INTRAVENOUS | Status: AC
Start: 2015-07-29 — End: 2015-07-29

## 2015-07-29 MED ORDER — ACETAMINOPHEN 325 MG PO TABS
650.0000 mg | ORAL_TABLET | Freq: Once | ORAL | Status: AC
  Administered 2015-07-29: 650 mg via ORAL
  Filled 2015-07-29: qty 2

## 2015-07-29 MED ORDER — HEPARIN SOD (PORK) LOCK FLUSH 100 UNIT/ML IV SOLN
INTRAVENOUS | Status: AC
  Filled 2015-07-29: qty 5

## 2015-07-29 MED ORDER — HYDROCODONE-ACETAMINOPHEN 5-325 MG PO TABS
1.0000 | ORAL_TABLET | Freq: Four times a day (QID) | ORAL | Status: DC | PRN
  Administered 2015-07-30 (×2): 1 via ORAL
  Filled 2015-07-29 (×2): qty 1

## 2015-07-29 MED ORDER — SODIUM CHLORIDE 0.9% FLUSH
10.0000 mL | INTRAVENOUS | Status: DC | PRN
  Administered 2015-07-29: 10 mL via INTRAVENOUS
  Filled 2015-07-29: qty 10

## 2015-07-29 MED ORDER — FUROSEMIDE 20 MG PO TABS
20.0000 mg | ORAL_TABLET | Freq: Every day | ORAL | Status: DC
Start: 2015-07-30 — End: 2015-07-30
  Filled 2015-07-29: qty 1

## 2015-07-29 MED ORDER — PREGABALIN 75 MG PO CAPS
75.0000 mg | ORAL_CAPSULE | Freq: Every day | ORAL | Status: DC
  Administered 2015-07-30: 75 mg via ORAL
  Filled 2015-07-29: qty 1

## 2015-07-29 MED ORDER — DIPHENHYDRAMINE HCL 25 MG PO CAPS
25.0000 mg | ORAL_CAPSULE | Freq: Once | ORAL | Status: AC
  Administered 2015-07-29: 25 mg via ORAL
  Filled 2015-07-29: qty 1

## 2015-07-29 MED ORDER — OMEGA-3-ACID ETHYL ESTERS 1 G PO CAPS
1.0000 g | ORAL_CAPSULE | Freq: Every day | ORAL | Status: DC
  Administered 2015-07-30: 1 g via ORAL
  Filled 2015-07-29: qty 1

## 2015-07-29 MED ORDER — LISINOPRIL 10 MG PO TABS
20.0000 mg | ORAL_TABLET | Freq: Every morning | ORAL | Status: DC
  Administered 2015-07-29: 20 mg via ORAL
  Filled 2015-07-29: qty 2

## 2015-07-29 MED ORDER — LISINOPRIL 10 MG PO TABS: 20.0000 mg | ORAL_TABLET | Freq: Every morning | ORAL | Status: DC

## 2015-07-29 MED ORDER — SODIUM CHLORIDE 0.9 % IV SOLN: 10.0000 mL/h | Freq: Once | INTRAVENOUS | Status: DC

## 2015-07-29 NOTE — ED Notes (Signed)
Patient refuses to put on hospital gown. States "This is ridiculous that I have to be admitted just to get blood and platelets. I'm gonna get my platelets and leave when they are done." Dr Marin Comment aware.

## 2015-07-29 NOTE — ED Notes (Signed)
Patient requesting tylenol and benadryl. States "they always give me tylenol and benadryl when I get a transfusion."

## 2015-07-29 NOTE — Progress Notes (Signed)
..  Jacqualine Code presented for Portacath access and flush.    Portacath located rt chest wall accessed with  H 20 needle.  Good blood return present. Portacath flushed with 74ml NS and 500U/44ml Heparin and needle removed intact.  Procedure tolerated well and without incident.

## 2015-07-29 NOTE — ED Notes (Signed)
Pt here for infusion of platelets. Called by [place] and instructed to come to ED.

## 2015-07-29 NOTE — H&P (Signed)
[facility] History and Physical  [name] TIW:[id number] DOB: 1945-11-11    PCP:   No PCP Per Patient   Chief Complaint:  Here for platelet and blood transfusion.   HPI:  [name] is a 70 y.o. male with a history of Multiple Myeloma, ESRD on HD ( Tues, Thurs, Sat), and HTN who presents to the ED for platelet transfusion.  He has been followed by Dr Whitney Muse and gets periodic platelet transfusion.  His count was 7K, with no active bleeding.  He is due for dialysis tomorrow as well.  Has hx of pancytopenia, with WBC of 1K, Hb of 7 g per dL, and platelet count of 7K.  He was supposed to have transfusion at the [facility], but was not able to get it on time.   He requires transfusion of platelet prior to getting dialysis.    Rewiew of Systems:  Constitutional: Negative for malaise, fever and chills. No significant weight loss or weight gain Eyes: Negative for eye pain, redness and discharge, diplopia, visual changes, or flashes of light. ENMT: Negative for ear pain, hoarseness, nasal congestion, sinus pressure and sore throat. No headaches; tinnitus, drooling, or problem swallowing. Cardiovascular: Negative for chest pain, palpitations, diaphoresis, dyspnea and peripheral edema. ; No orthopnea, PND Respiratory: Negative for cough, hemoptysis, wheezing and stridor. No pleuritic chestpain. Gastrointestinal: Negative for nausea, vomiting, diarrhea, constipation, abdominal pain, melena, blood in stool, hematemesis, jaundice and rectal bleeding.    Genitourinary: Negative for frequency, dysuria, incontinence,flank pain and hematuria; Musculoskeletal: Negative for back pain and neck pain. Negative for swelling and trauma.;  Skin: . Negative for pruritus, rash, abrasions, bruising and skin lesion.; ulcerations Neuro: Negative for headache, lightheadedness and neck stiffness. Negative for weakness, altered level of consciousness , altered mental status, extremity weakness, burning feet,  involuntary movement, seizure and syncope.  Psych: negative for anxiety, depression, insomnia, tearfulness, panic attacks, hallucinations, paranoia, suicidal or homicidal ideation    Past Medical History  Diagnosis Date  . Hypertension   . Ruptured cervical disc   . Fall resulting in striking against other object     paralyzed  . Multiple myeloma   . Recurrent multiple myeloma of bone marrow with unknown EBV status ([place])   . Multiple myeloma ([place]) 01/26/2011  . Unspecified vitamin D deficiency 03/06/2013  . Anemia of chronic disease 12/30/2013  . Constipation   . Dialysis patient ([place]) 2016  . Thrombocytopenia ([place]) 08/31/2014  . 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 Constipation     Past Surgical History  Procedure Laterality Date  . Inguinal hernia repair Bilateral   . Anterior cervical decomp/discectomy fusion    . Bone marrow aspirate and biopsy wiith lumbar puncture    . Melanoma excision      rt. breast  . Cervical spine surgery    . Kyphoplasty Bilateral 01/30/2014    Procedure: Thoracic eleven Kyphoplasty;  Surgeon: Consuella Lose, MD;  [facility];  Service: Neurosurgery;  Laterality: Bilateral;  Thoracic eleven Kyphoplasty  . Av fistula placement Right 04/30/2014    Procedure: BRACHIOCEPHALIC ARTERIOVENOUS (AV) FISTULA CREATION;  Surgeon: Conrad Plano, MD;  Location: [place];  Service: Vascular;  Laterality: Right;  . Portacath placement Left 02/18/2015    Procedure: INSERTION OF PORT A CATH RIGHT INTERNAL JUGULAR VEIN ;  Surgeon: Conrad Lynn, MD;  Location: [place];  Service:  Vascular;  Laterality: Left;  . Cataract extraction w/phaco Right 04/15/2015    Procedure: CATARACT EXTRACTION PHACO AND INTRAOCULAR LENS PLACEMENT (IOC);  Surgeon: Tonny Branch, MD;  Location: [facility];  Service: Ophthalmology;  Laterality: Right;  CDE:8.97  . Cataract extraction  w/phaco Left 04/29/2015    Procedure: CATARACT EXTRACTION PHACO AND INTRAOCULAR LENS PLACEMENT ; CDE:  7.61;  Surgeon: Tonny Branch, MD;  Location: [facility];  Service: Ophthalmology;  Laterality: Left;    Medications:  HOME MEDS: Prior to Admission medications   Medication Sig Start Date End Date Taking? Authorizing Provider  furosemide (LASIX) 20 MG tablet Take 20 mg by mouth daily.   Yes Historical Provider, MD  HYDROcodone-acetaminophen (NORCO/VICODIN) 5-325 MG tablet Take 1 tablet by mouth every 6 (six) hours as needed for severe pain. 06/24/15  Yes Manon Hilding Kefalas, PA-C  lisinopril (PRINIVIL,ZESTRIL) 20 MG tablet Take 20 mg by mouth every morning.  06/27/15  Yes Historical Provider, MD  Omega-3 Fatty Acids (FISH OIL) 1000 MG CAPS Take 2 capsules by mouth daily.   Yes Historical Provider, MD  ondansetron (ZOFRAN) 8 MG tablet Take 1 tablet (8 mg total) by mouth every 8 (eight) hours as needed for nausea or vomiting. 03/15/15  Yes Manon Hilding Kefalas, PA-C  pregabalin (LYRICA) 75 MG capsule Take 75 mg by mouth daily.   Yes Historical Provider, MD  pregabalin (LYRICA) 75 MG capsule Take 75 mg by mouth daily.   Yes Historical Provider, MD  lenalidomide (REVLIMID) 5 MG capsule Take one tablet every other day, after dialysis Patient not taking: Reported on 07/29/2015 07/25/15   Manon Hilding Kefalas, PA-C  LYRICA 75 MG capsule TAKE 1 CAPSULE BY MOUTH TWICE DAILY. Patient not taking: Reported on 07/29/2015 06/12/15   Baird Cancer, PA-C     Allergies:  Allergies  Allergen Reactions  . Pamidronate Anaphylaxis    blood pressure     Social History:   reports that he has never smoked. He has never used smokeless tobacco. He reports that he does not drink alcohol or use illicit drugs.  Family History: Family History  Problem Relation Age of Onset  . Cancer Sister   . Cancer Brother      Physical Exam: Filed Vitals:   07/29/15 1722 07/29/15 1947  BP: 181/90 181/96  Pulse: 90 79  Temp: 97.7 F  (36.5 C) 98.1 F (36.7 C)  TempSrc: Oral Oral  Resp: 18 16  Height: 5' 10"  (1.778 m)   Weight: 109 kg (240 lb 4.8 oz)   SpO2: 100% 98%   Blood pressure 181/96, pulse 79, temperature 98.1 F (36.7 C), temperature source Oral, resp. rate 16, height 5' 10"  (1.778 m), weight 109 kg (240 lb 4.8 oz), SpO2 98 %.  GEN:  Pleasant patient lying in the stretcher in no acute distress; cooperative with exam. PSYCH:  alert and oriented x4; does not appear anxious or depressed; affect is appropriate. HEENT: Mucous membranes pink and anicteric; PERRLA; EOM intact; no cervical lymphadenopathy nor thyromegaly or carotid bruit; no JVD; There were no stridor. Neck is very supple. Breasts:: Not examined CHEST WALL: No tenderness CHEST: Normal respiration, clear to auscultation bilaterally.  HEART: Regular rate and rhythm.  There are no murmur, rub, or gallops.   BACK: No kyphosis or scoliosis; no CVA tenderness ABDOMEN: soft and non-tender; no masses, no organomegaly, normal abdominal bowel sounds; no pannus; no intertriginous candida. There is no rebound and no distention. Rectal Exam: Not done EXTREMITIES: No bone or joint  deformity; age-appropriate arthropathy of the hands and knees; no edema; no ulcerations.  There is no calf tenderness. Genitalia: not examined PULSES: 2+ and symmetric SKIN: Normal hydration no rash or ulceration CNS: Cranial nerves 2-12 grossly intact no focal lateralizing neurologic deficit.  Speech is fluent; uvula elevated with phonation, facial symmetry and tongue midline. DTR are normal bilaterally, cerebella exam is intact, barbinski is negative and strengths are equaled bilaterally.  No sensory loss.   Labs on Admission:  Basic Metabolic Panel:  Recent Labs Lab 07/24/15 1138 07/29/15 1200  NA 137 139  K 4.1 4.0  CL 103 104  CO2 27 28  GLUCOSE 97 120*  BUN 30* 44*  CREATININE 4.91* 6.27*  CALCIUM 8.0* 9.6   Liver Function Tests:  Recent Labs Lab 07/24/15 1138  07/29/15 1200  AST 18 22  ALT 19 24  ALKPHOS 38 35*  BILITOT 1.2 0.9  PROT 6.1* 6.4*  ALBUMIN 3.1* 3.3*   CBC:  Recent Labs Lab 07/24/15 1138 07/29/15 1200  WBC 1.1* 1.0*  NEUTROABS  --  0.4*  HGB 8.7* 7.4*  HCT 25.9* 23.1*  MCV 107.9* 111.1*  PLT 8* 7*    Assessment/Plan Present on Admission:  . Thrombocytopenia ([place]) . Anemia, chronic renal failure . Multiple myeloma (HCC)  PLAN:  Pancytopenia, multiple myeloma:  Will transfuse 1 unit of platelet, and 1 unit of PRBC.  He will obtain outpatient dialysis tomorrow.   He has already plan to leave AMA as soon as his blood and platelet transfusion is done.  I have continued his home meds.    Other plans as per orders. Code Status: FULL CODE>    Orvan Falconer, MD. FACP [facility] Pager [phone] 7pm to 7am.  07/29/2015, 8:02 PM

## 2015-07-29 NOTE — Progress Notes (Signed)
CRITICAL VALUE ALERT  Critical value received:  WBC 1.03; platelets 7,000  Date of notification:  07/29/15  Time of notification:  [id number]   Critical values read back:Yes.    Nurse who received alert:  A. Ouida Sills, RN  MD notified:  Dr. Whitney Muse at 1430  Orders rec'd for platelet transfusion one unit ASAP and Neulasta injection. Spoke with pt - he has dialysis tomorrow, and he will be unable to arrive to the clinic until after 3:00 pm.  Spoke with Judeen Hammans in blood bank, and they will be able to get one unit of platelets to [facility] today, but approximate time of arrival will be approximately 6 pm (after clinic hours).  Pt instructed to report to ED after 5 pm today to receive platelets.  Spoke with Magda Paganini, ED charge RN and gave her report of the situation.  Pt will return to the clinic tomorrow after dialysis to receive Neulasta injection.

## 2015-07-29 NOTE — ED Provider Notes (Signed)
CSN: [id number]     Arrival date & time 07/29/15  1711 History   First MD Initiated Contact with Patient 07/29/15 1838     Chief Complaint  Patient presents with  . Abnormal Lab      HPI Pt was seen at 1845. Per pt: c/o gradual onset and persistence of constant "low platelets and RBC" that has been progressive since last week. Pt was transfused platelets last week for plts count 8. Pt had lab draw this afternoon and was told to go to Short Stay or [place] for transfusion. Pt came to the ED. States he "needs to get this done today because I have HD tomorrow morning." Denies any other complaints.    Past Medical History  Diagnosis Date  . Hypertension   . Ruptured cervical disc   . Fall resulting in striking against other object     paralyzed  . Multiple myeloma   . Recurrent multiple myeloma of bone marrow with unknown EBV status ([place])   . Multiple myeloma ([place]) 01/26/2011  . Unspecified vitamin D deficiency 03/06/2013  . Anemia of chronic disease 12/30/2013  . Constipation   . Dialysis patient ([place]) 2016  . Thrombocytopenia ([place]) 08/31/2014  . 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 Constipation    Past Surgical History  Procedure Laterality Date  . Inguinal hernia repair Bilateral   . Anterior cervical decomp/discectomy fusion    . Bone marrow aspirate and biopsy wiith lumbar puncture    . Melanoma excision      rt. breast  . Cervical spine surgery    . Kyphoplasty Bilateral 01/30/2014    Procedure: Thoracic eleven Kyphoplasty;  Surgeon: Consuella Lose, MD;  [facility];  Service: Neurosurgery;  Laterality: Bilateral;  Thoracic eleven Kyphoplasty  . Av fistula placement Right 04/30/2014    Procedure: BRACHIOCEPHALIC ARTERIOVENOUS (AV) FISTULA CREATION;  Surgeon: Conrad Geary, MD;  Location: [place];  Service: Vascular;  Laterality: Right;  . Portacath  placement Left 02/18/2015    Procedure: INSERTION OF PORT A CATH RIGHT INTERNAL JUGULAR VEIN ;  Surgeon: Conrad [place], MD;  Location: [place];  Service: Vascular;  Laterality: Left;  . Cataract extraction w/phaco Right 04/15/2015    Procedure: CATARACT EXTRACTION PHACO AND INTRAOCULAR LENS PLACEMENT (IOC);  Surgeon: Tonny Branch, MD;  Location: [facility];  Service: Ophthalmology;  Laterality: Right;  CDE:8.97  . Cataract extraction w/phaco Left 04/29/2015    Procedure: CATARACT EXTRACTION PHACO AND INTRAOCULAR LENS PLACEMENT ; CDE:  7.61;  Surgeon: Tonny Branch, MD;  Location: [facility];  Service: Ophthalmology;  Laterality: Left;   Family History  Problem Relation Age of Onset  . Cancer Sister   . Cancer Brother    Social History  Substance Use Topics  . Smoking status: Never Smoker   . Smokeless tobacco: Never Used  . Alcohol Use: No    Review of Systems ROS: Statement: All systems negative except as marked or noted in the HPI; Constitutional: Negative for fever and chills. ; ; Eyes: Negative for eye pain, redness and discharge. ; ; ENMT: Negative for ear pain, hoarseness, nasal congestion, sinus pressure and sore throat. ; ; Cardiovascular: Negative for chest pain, palpitations, diaphoresis, dyspnea and peripheral edema. ; ; Respiratory: Negative for cough, wheezing and stridor. ; ; Gastrointestinal: Negative for nausea, vomiting, diarrhea, abdominal pain, blood in stool, hematemesis, jaundice and  rectal bleeding. . ; ; Genitourinary: Negative for dysuria, flank pain and hematuria. ; ; Musculoskeletal: Negative for back pain and neck pain. Negative for swelling and trauma.; ; Skin: Negative for pruritus, rash, abrasions, blisters, bruising and skin lesion.; ; Neuro: Negative for headache, lightheadedness and neck stiffness. Negative for weakness, altered level of consciousness , altered mental status, extremity weakness, paresthesias, involuntary movement, seizure and syncope.      Allergies   Pamidronate  Home Medications   Prior to Admission medications   Medication Sig Start Date End Date Taking? Authorizing Provider  HYDROcodone-acetaminophen (NORCO/VICODIN) 5-325 MG tablet Take 1 tablet by mouth every 6 (six) hours as needed for severe pain. 06/24/15   Baird Cancer, PA-C  lenalidomide (REVLIMID) 5 MG capsule Take one tablet every other day, after dialysis 07/25/15   Baird Cancer, PA-C  lisinopril (PRINIVIL,ZESTRIL) 20 MG tablet Take 40 mg by mouth every morning.  06/27/15   Historical Provider, MD  LYRICA 75 MG capsule TAKE 1 CAPSULE BY MOUTH TWICE DAILY. 06/12/15   Baird Cancer, PA-C  Omega-3 Fatty Acids (FISH OIL) 1000 MG CAPS Take 2 capsules by mouth daily.    Historical Provider, MD  ondansetron (ZOFRAN) 8 MG tablet Take 1 tablet (8 mg total) by mouth every 8 (eight) hours as needed for nausea or vomiting. 03/15/15   Baird Cancer, PA-C  oxyCODONE-acetaminophen (ROXICET) 5-325 MG tablet Take 1-2 tablets by mouth every 6 (six) hours as needed for moderate pain. 02/18/15   Conrad [place], MD   BP 181/90 mmHg  Pulse 90  Temp(Src) 97.7 F (36.5 C) (Oral)  Resp 18  Ht _0  (1.778 m)  Wt 240 lb 4.8 oz (109 kg)  BMI 34.48 kg/m2  SpO2 100% Physical Exam  1850: Physical examination:  Nursing notes reviewed; Vital signs and O2 SAT reviewed;  Constitutional: Well developed, Well nourished, Well hydrated, In no acute distress; Head:  Normocephalic, atraumatic; Eyes: EOMI, PERRL, No scleral icterus; ENMT: Mouth and pharynx normal, Mucous membranes moist; Neck: Supple, Full range of motion, No lymphadenopathy; Cardiovascular: Regular rate and rhythm, No gallop; Respiratory: Breath sounds clear & equal bilaterally, No wheezes.  Speaking full sentences with ease, Normal respiratory effort/excursion; Chest: Nontender, Movement normal; Abdomen: Soft, Nontender, Nondistended, Normal bowel sounds; Genitourinary: No CVA tenderness; Extremities: Pulses normal, No tenderness, No edema,  No calf edema or asymmetry.; Neuro: AA&Ox3, Major CN grossly intact.  Speech clear. No gross focal motor or sensory deficits in extremities.; Skin: Color normal, Warm, Dry.; Psych:  Argumentative.    ED Course  Procedures (including critical care time) Labs Review  Imaging Review  I have personally reviewed and evaluated these images and lab results as part of my medical decision-making.   EKG Interpretation None      MDM  MDM Reviewed: previous chart, nursing note and vitals Reviewed previous: labs     Results for orders placed or performed in visit on 07/29/15  CBC with Differential  Result Value Ref Range   WBC 1.0 (LL) 4.0 - 10.5 K/uL   RBC 2.08 (L) 4.22 - 5.81 MIL/uL   Hemoglobin 7.4 (L) 13.0 - 17.0 g/dL   HCT 23.1 (L) 39.0 - 52.0 %   MCV 111.1 (H) 78.0 - 100.0 fL   MCH 35.6 (H) 26.0 - 34.0 pg   MCHC 32.0 30.0 - 36.0 g/dL   RDW 23.0 (H) 11.5 - 15.5 %   Platelets 7 (LL) 150 - 400 K/uL   Neutrophils Relative %  37 %   Neutro Abs 0.4 (L) 1.7 - 7.7 K/uL   Lymphocytes Relative 57 %   Lymphs Abs 0.7 0.7 - 4.0 K/uL   Monocytes Relative 6 %   Monocytes Absolute 0.1 0.1 - 1.0 K/uL   Eosinophils Relative 1 %   Eosinophils Absolute 0.0 0.0 - 0.7 K/uL   Basophils Relative 0 %   Basophils Absolute 0.0 0.0 - 0.1 K/uL   RBC Morphology POLYCHROMASIA PRESENT   Comprehensive metabolic panel  Result Value Ref Range   Sodium 139 135 - 145 mmol/L   Potassium 4.0 3.5 - 5.1 mmol/L   Chloride 104 101 - 111 mmol/L   CO2 28 22 - 32 mmol/L   Glucose, Bld 120 (H) 65 - 99 mg/dL   BUN 44 (H) 6 - 20 mg/dL   Creatinine, Ser 6.27 (H) 0.61 - 1.24 mg/dL   Calcium 9.6 8.9 - 10.3 mg/dL   Total Protein 6.4 (L) 6.5 - 8.1 g/dL   Albumin 3.3 (L) 3.5 - 5.0 g/dL   AST 22 15 - 41 U/L   ALT 24 17 - 63 U/L   Alkaline Phosphatase 35 (L) 38 - 126 U/L   Total Bilirubin 0.9 0.3 - 1.2 mg/dL   GFR calc non Af Amer 8 (L) >60 mL/min   GFR calc Af Amer 10 (L) >60 mL/min   Anion gap 7 5 - 15  Lactate  dehydrogenase  Result Value Ref Range   LDH 273 (H) 98 - 192 U/L  Sedimentation rate  Result Value Ref Range   Sed Rate 20 (H) 0 - 16 mm/hr    Results for [name], [name] (MRN [id number]) as of 07/29/2015 18:44  Ref. Range 07/16/2015 01:35 07/16/2015 04:20 07/17/2015 09:30 07/24/2015 11:38 07/29/2015 12:00  Hemoglobin Latest Ref Range: 13.0-17.0 g/dL 8.9 (L) 8.8 (L) 9.2 (L) 8.7 (L) 7.4 (L)  HCT Latest Ref Range: 39.0-52.0 % 27.3 (L) 27.3 (L) 28.4 (L) 25.9 (L) 23.1 (L)  Platelets Latest Ref Range: 150-400 K/uL 25 (LL) 28 (LL) 21 (LL) 8 (LL) 7 (LL)    Results for [name], [name] (MRN [id number]) as of 07/29/2015 18:44  Ref. Range 07/15/2015 20:33 07/16/2015 08:49 07/17/2015 09:30 07/24/2015 11:38 07/29/2015 12:00  BUN Latest Ref Range: 6-20 mg/dL 49 (H) 52 (H) 39 (H) 30 (H) 44 (H)  Creatinine Latest Ref Range: 0.61-1.24 mg/dL 6.73 (H) 7.28 (H) 5.37 (H) 4.91 (H) 6.27 (H)    1855:  T/C to Onc Dr. Whitney Muse, case discussed, including:  HPI, pertinent PM/SHx, VS/PE, dx testing, ED course and treatment:  States she knows pt well, she explained to pt the circumstances today s/p late lab draw (would not receive plts from [place] in timely fashion), OK to admit to hospital to transfuse 1 plts and 1 PRBC's, she will speak with pt tomorrow.  1910:  Pt states he has HD tomorrow morning and "needs to be out of here by 10am;" explained that I would inform [facility] of this, but could not guarantee it. T/C to [facility] Dr. Marin Comment, case discussed, including:  HPI, pertinent PM/SHx, VS/PE, dx testing, ED course and treatment:  Agreeable to admit, requests to write temporary orders, obtain medical bed to team [facility].     Francine Graven, DO 08/02/15 1733

## 2015-07-30 ENCOUNTER — Ambulatory Visit (HOSPITAL_COMMUNITY): Payer: Medicare Other

## 2015-07-30 ENCOUNTER — Telehealth (HOSPITAL_COMMUNITY): Payer: Self-pay | Admitting: Oncology

## 2015-07-30 DIAGNOSIS — N186 End stage renal disease: Secondary | ICD-10-CM | POA: Diagnosis not present

## 2015-07-30 DIAGNOSIS — D696 Thrombocytopenia, unspecified: Secondary | ICD-10-CM | POA: Diagnosis not present

## 2015-07-30 DIAGNOSIS — C9000 Multiple myeloma not having achieved remission: Secondary | ICD-10-CM

## 2015-07-30 DIAGNOSIS — Z992 Dependence on renal dialysis: Secondary | ICD-10-CM | POA: Diagnosis not present

## 2015-07-30 LAB — CBC WITH DIFFERENTIAL/PLATELET
Basophils Absolute: 0 10*3/uL (ref 0.0–0.1)
Basophils Relative: 0 %
EOS ABS: 0 10*3/uL (ref 0.0–0.7)
EOS PCT: 3 %
HCT: 25.1 % — ABNORMAL LOW (ref 39.0–52.0)
Hemoglobin: 8.4 g/dL — ABNORMAL LOW (ref 13.0–17.0)
LYMPHS ABS: 0.6 10*3/uL — AB (ref 0.7–4.0)
Lymphocytes Relative: 54 %
MCH: 35.4 pg — AB (ref 26.0–34.0)
MCHC: 33.5 g/dL (ref 30.0–36.0)
MCV: 105.9 fL — ABNORMAL HIGH (ref 78.0–100.0)
MONO ABS: 0.1 10*3/uL (ref 0.1–1.0)
Monocytes Relative: 6 %
Neutro Abs: 0.4 10*3/uL — ABNORMAL LOW (ref 1.7–7.7)
Neutrophils Relative %: 37 %
PLATELETS: 20 10*3/uL — AB (ref 150–400)
RBC: 2.37 MIL/uL — AB (ref 4.22–5.81)
RDW: 26.8 % — AB (ref 11.5–15.5)
WBC: 1.2 10*3/uL — AB (ref 4.0–10.5)

## 2015-07-30 LAB — PROTEIN ELECTROPHORESIS, SERUM
A/G RATIO SPE: 1.6 (ref 0.7–1.7)
ALBUMIN ELP: 3.6 g/dL (ref 2.9–4.4)
Alpha-1-Globulin: 0.2 g/dL (ref 0.0–0.4)
Alpha-2-Globulin: 0.3 g/dL — ABNORMAL LOW (ref 0.4–1.0)
BETA GLOBULIN: 0.8 g/dL (ref 0.7–1.3)
GLOBULIN, TOTAL: 2.3 g/dL (ref 2.2–3.9)
Gamma Globulin: 0.9 g/dL (ref 0.4–1.8)
Total Protein ELP: 5.9 g/dL — ABNORMAL LOW (ref 6.0–8.5)

## 2015-07-30 LAB — PREPARE PLATELET PHERESIS: Unit division: 0

## 2015-07-30 LAB — IGG, IGA, IGM
IGA: 351 mg/dL (ref 61–437)
IGM, SERUM: 71 mg/dL (ref 20–172)
IgG (Immunoglobin G), Serum: 841 mg/dL (ref 700–1600)

## 2015-07-30 LAB — KAPPA/LAMBDA LIGHT CHAINS
Kappa free light chain: 312.42 mg/L — ABNORMAL HIGH (ref 3.30–19.40)
Kappa, lambda light chain ratio: 2.65 — ABNORMAL HIGH (ref 0.26–1.65)
LAMDA FREE LIGHT CHAINS: 117.76 mg/L — AB (ref 5.71–26.30)

## 2015-07-30 MED ORDER — SODIUM CHLORIDE 0.9 % IV SOLN: 100.0000 mL | INTRAVENOUS | Status: DC | PRN

## 2015-07-30 MED ORDER — HEPARIN SOD (PORK) LOCK FLUSH 100 UNIT/ML IV SOLN
500.0000 [IU] | Freq: Once | INTRAVENOUS | Status: AC
  Administered 2015-07-30: 500 [IU] via INTRAVENOUS
  Filled 2015-07-30: qty 5

## 2015-07-30 MED ORDER — CALCIUM ACETATE (PHOS BINDER) 667 MG PO CAPS: 667.0000 mg | ORAL_CAPSULE | Freq: Three times a day (TID) | ORAL | Status: DC

## 2015-07-30 MED ORDER — PENTAFLUOROPROP-TETRAFLUOROETH EX AERO: 1.0000 "application " | INHALATION_SPRAY | CUTANEOUS | Status: DC | PRN

## 2015-07-30 MED ORDER — CALCIUM ACETATE (PHOS BINDER) 667 MG PO CAPS
2001.0000 mg | ORAL_CAPSULE | Freq: Three times a day (TID) | ORAL | Status: DC
  Administered 2015-07-30 (×2): 2001 mg via ORAL
  Filled 2015-07-30 (×4): qty 3

## 2015-07-30 MED ORDER — LIDOCAINE HCL (PF) 1 % IJ SOLN: 5.0000 mL | INTRAMUSCULAR | Status: DC | PRN

## 2015-07-30 MED ORDER — LIDOCAINE-PRILOCAINE 2.5-2.5 % EX CREA: 1.0000 "application " | TOPICAL_CREAM | CUTANEOUS | Status: DC | PRN

## 2015-07-30 NOTE — Plan of Care (Signed)
Problem: Physical Regulation: Goal: Ability to maintain clinical measurements within normal limits will improve Outcome: Not Progressing See vital sign flowsheet.

## 2015-07-30 NOTE — Care Management Obs Status (Signed)
[place] NOTIFICATION   Patient Details  Name: CONO LIVENGOOD MRN: [id number] Date of Birth: 1945-09-06   Medicare Observation Status Notification Given:  Yes    Alvie Heidelberg, RN 07/30/2015, 9:27 AM

## 2015-07-30 NOTE — Telephone Encounter (Signed)
Patient is currently admitted to the hospital.  [name], [name] was inappropriate and unrealistic with demands regarding his transfusional needs.   He called the clinic today from his hospital room requesting I call him.   He did not answer the phone.  I had planned on reviewing the following information:  His behavior yesterday was inappropriate and will not be tolerated moving forward.  Otherwise, he will be discharged from [facility] and referred elsewhere.  Additionally, his platelets come from [place] and therefore it take 2-3 hours to be on site.  As a result, from now on, he will have labs in the morning (coordinated with days off from dialysis) and het will need peripheral sticks as a result as our clinic nurses are busy with chemotherapy patients, thus, they can no longer leave their workstations to access his port.  Will try again at a later date and time.  Robynn Pane, PA-C 07/30/2015 3:46 PM

## 2015-07-30 NOTE — Discharge Summary (Signed)
Physician Discharge Summary  [name] JGG:[id number] DOB: Mar 15, 1946 DOA: 07/29/2015  PCP: No PCP Per Patient  Admit date: 07/29/2015 Discharge date: 07/30/2015  Time spent: 45 minutes  Recommendations for Outpatient Follow-up:  -Will be discharged home today after HD. -Advised to follow up with his oncologist as scheduled.   Discharge Diagnoses:  Principal Problem:   Thrombocytopenia ([place]) Active Problems:   Multiple myeloma (HCC)   ESRD on dialysis ([place])   Anemia, chronic renal failure   Discharge Condition: Stable and improved  Filed Weights   07/29/15 2136 07/30/15 0718 07/30/15 1505  Weight: 110.2 kg (242 lb 15.2 oz) 111.131 kg (245 lb) 111.7 kg (246 lb 4.1 oz)    History of present illness:  As per Dr. Marin Comment on 3/13: [name] is a 70 y.o. male with a history of Multiple Myeloma, ESRD on HD ( Tues, Thurs, Sat), and HTN who presents to the ED for platelet transfusion. He has been followed by Dr Whitney Muse and gets periodic platelet transfusion. His count was 7K, with no active bleeding. He is due for dialysis tomorrow as well. Has hx of pancytopenia, with WBC of 1K, Hb of 7 g per dL, and platelet count of 7K. He was supposed to have transfusion at the [facility], but was not able to get it on time. He requires transfusion of platelet prior to getting dialysis.   Hospital Course:   MM/Pancytopenia -Was admitted from the cancer center for the purpose of PRBC and platelet transfusion for a plt count of 7 and Hb of 7.4. -Received 1 units of PRBCs and 1 unit of platelets with resultant counts of 8.4 and 20 respectively. -No signs of active bleeding.  ESRD -Will be dialyzed today to keep him on schedule prior to DC home.  Rest of chronic issues are stable and home meds have not been changed.  Procedures:  None   Consultations:  None  Discharge Instructions  Discharge Instructions    Diet - low sodium heart healthy    Complete by:  As directed      Increase activity slowly    Complete by:  As directed             Medication List    STOP taking these medications        lenalidomide 5 MG capsule  Commonly known as:  REVLIMID      TAKE these medications        calcium acetate 667 MG capsule  Commonly known as:  PHOSLO  Take 667-2,001 mg by mouth 3 (three) times daily with meals. Three capsules three times daily with meals.  1 capsule with snacks.     Fish Oil 1000 MG Caps  Take 2 capsules by mouth daily.     furosemide 20 MG tablet  Commonly known as:  LASIX  Take 20 mg by mouth daily.     HYDROcodone-acetaminophen 5-325 MG tablet  Commonly known as:  NORCO/VICODIN  Take 1 tablet by mouth every 6 (six) hours as needed for severe pain.     lisinopril 20 MG tablet  Commonly known as:  PRINIVIL,ZESTRIL  Take 20 mg by mouth every morning.     ondansetron 8 MG tablet  Commonly known as:  ZOFRAN  Take 1 tablet (8 mg total) by mouth every 8 (eight) hours as needed for nausea or vomiting.     pregabalin 75 MG capsule  Commonly known as:  LYRICA  Take 75 mg by mouth daily.  Allergies  Allergen Reactions  . Pamidronate Anaphylaxis    blood pressure       The results of significant diagnostics from this hospitalization (including imaging, microbiology, ancillary and laboratory) are listed below for reference.    Significant Diagnostic Studies: Ct Abdomen Pelvis Wo Contrast  07/15/2015  CLINICAL DATA:  Acute onset of nausea vomiting and diarrhea. Sharp stabbing right upper quadrant abdominal pain. Initial encounter. EXAM: CT ABDOMEN AND PELVIS WITHOUT CONTRAST TECHNIQUE: Multidetector CT imaging of the abdomen and pelvis was performed following the standard protocol without IV contrast. COMPARISON:  PET/CT performed 04/04/2010 FINDINGS: A small right pleural effusion is noted, with associated atelectasis. Minimal interstitial prominence is noted at the lung bases. Diffuse coronary artery calcifications are seen.  The liver and spleen are unremarkable in appearance. There is minimal haziness about the gallbladder, of uncertain significance. The gallbladder is otherwise grossly unremarkable on CT. The pancreas and adrenal glands are unremarkable. Severe chronic bilateral renal atrophy is noted, with a few bilateral renal cysts and minimal nonobstructing stones. Nonspecific perinephric stranding is noted bilaterally. There is no evidence of hydronephrosis. No obstructing ureteral stones are identified. A focus of calcification within the mid abdominal mesentery is likely benign. No free fluid is identified. The small bowel is unremarkable in appearance. The stomach is within normal limits. No acute vascular abnormalities are seen. The appendix is normal in caliber, without evidence of appendicitis. The colon is unremarkable in appearance. The bladder is mildly distended and grossly unremarkable. The prostate remains normal in size, with scattered calcification. No inguinal lymphadenopathy is seen. No acute osseous abnormalities are identified. The patient is status post vertebroplasty at T11. Disc space narrowing is noted at L4-L5. IMPRESSION: 1. Minimal haziness about the gallbladder, of uncertain significance. It is otherwise unremarkable in appearance on CT. If the patient's symptoms persist, right upper quadrant ultrasound could be considered for further evaluation. 2. Small right pleural effusion, with associated atelectasis. Minimal interstitial prominence at the lung bases. 3. Diffuse coronary artery calcifications seen. 4. Severe chronic bilateral renal atrophy, with a few bilateral renal cysts and minimal nonobstructing stones. Electronically Signed   By: Garald Balding M.D.   On: 07/15/2015 00:24   Dg Chest 2 View  07/15/2015  CLINICAL DATA:  Acute onset of diarrhea and vomiting. Initial encounter. EXAM: CHEST  2 VIEW COMPARISON:  Chest radiograph performed 04/24/2015 FINDINGS: The lungs are well-aerated. Vascular  congestion is noted, with right perihilar opacities, concerning for pneumonia. Asymmetric interstitial edema might have a similar appearance. A small right pleural effusion is noted. No pneumothorax is seen. The heart is mildly enlarged. A right-sided chest port is noted ending about the mid SVC. No acute osseous abnormalities are seen. Cervical spinal fusion hardware is noted. IMPRESSION: Vascular congestion and mild cardiomegaly, with right perihilar opacities, concerning for pneumonia. Asymmetric interstitial edema might have a similar appearance. Small right pleural effusion noted. Followup PA and lateral chest X-ray is recommended in 3-4 weeks following completion of treatment, to ensure resolution and exclude underlying malignancy. Electronically Signed   By: Garald Balding M.D.   On: 07/15/2015 00:35   Ct Biopsy  07/17/2015  CLINICAL DATA:  Multiple myeloma EXAM: CT GUIDED DEEP ILIAC BONE ASPIRATION AND CORE BIOPSY TECHNIQUE: Patient was placed supine on the CT gantry and limited axial scans through the pelvis were obtained. Appropriate skin entry site was identified. Skin site was marked, prepped with Betadine, draped in usual sterile fashion, and infiltrated locally with 1% lidocaine. Intravenous Fentanyl and Versed  were administered as conscious sedation during continuous monitoring of the patient's level of consciousness and physiological / cardiorespiratory status by the radiology RN, with a total moderate sedation time of 7 minutes. Under CT fluoroscopic guidance an 11-gauge Cook trocar bone needle was advanced into the right iliac bone just lateral to the sacroiliac joint. Once needle tip position was confirmed, coaxial core and aspiration samples were obtained. The final sample was obtained using the guiding needle itself, which was then removed. Post procedure scans show no hematoma or fracture. Patient tolerated procedure well. COMPLICATIONS: COMPLICATIONS none IMPRESSION: 1. Technically  successful CT guided right iliac bone core and aspiration biopsy. Electronically Signed   By: Lucrezia Europe M.D.   On: 07/17/2015 11:05    Microbiology: No results found for this or any previous visit (from the past 240 hour(s)).   Labs: Basic Metabolic Panel:  Recent Labs Lab 07/24/15 1138 07/29/15 1200  NA 137 139  K 4.1 4.0  CL 103 104  CO2 27 28  GLUCOSE 97 120*  BUN 30* 44*  CREATININE 4.91* 6.27*  CALCIUM 8.0* 9.6   Liver Function Tests:  Recent Labs Lab 07/24/15 1138 07/29/15 1200  AST 18 22  ALT 19 24  ALKPHOS 38 35*  BILITOT 1.2 0.9  PROT 6.1* 6.4*  ALBUMIN 3.1* 3.3*   No results for input(s): LIPASE, AMYLASE in the last 168 hours. No results for input(s): AMMONIA in the last 168 hours. CBC:  Recent Labs Lab 07/24/15 1138 07/29/15 1200 07/30/15 0752  WBC 1.1* 1.0* 1.2*  NEUTROABS  --  0.4* 0.4*  HGB 8.7* 7.4* 8.4*  HCT 25.9* 23.1* 25.1*  MCV 107.9* 111.1* 105.9*  PLT 8* 7* 20*   Cardiac Enzymes: No results for input(s): CKTOTAL, CKMB, CKMBINDEX, TROPONINI in the last 168 hours. BNP: BNP (last 3 results) No results for input(s): BNP in the last 8760 hours.  ProBNP (last 3 results) No results for input(s): PROBNP in the last 8760 hours.  CBG: No results for input(s): GLUCAP in the last 168 hours.     SignedLelon Frohlich  [facility] Pager: [phone] 07/30/2015, 4:29 PM

## 2015-07-30 NOTE — Consult Note (Signed)
Reason for Consult: End-stage renal disease Referring Physician: Dr. Raymondo Band is an 70 y.o. male.  HPI: He is a patient who has history of multiple myeloma: Status post bone marrow transplant, history of hypertension, history of end-stage renal disease on maintenance hemodialysis presently came with complaints of weakness, feeling tired and patient was found to have severe anemia, thrombocytopenia hence admitted to the hospital. Patient has received 1 unit of platelets and 1 it's of packed red blood cells. Presently is feeling somewhat better. He denies any nausea or vomiting.  Past Medical History  Diagnosis Date  . Hypertension   . Ruptured cervical disc   . Fall resulting in striking against other object     paralyzed  . Multiple myeloma   . Recurrent multiple myeloma of bone marrow with unknown EBV status ([place])   . Multiple myeloma ([place]) 01/26/2011  . Unspecified vitamin D deficiency 03/06/2013  . Anemia of chronic disease 12/30/2013  . Constipation   . Dialysis patient ([place]) 2016  . Thrombocytopenia ([place]) 08/31/2014  . 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 Constipation     Past Surgical History  Procedure Laterality Date  . Inguinal hernia repair Bilateral   . Anterior cervical decomp/discectomy fusion    . Bone marrow aspirate and biopsy wiith lumbar puncture    . Melanoma excision      rt. breast  . Cervical spine surgery    . Kyphoplasty Bilateral 01/30/2014    Procedure: Thoracic eleven Kyphoplasty;  Surgeon: Consuella Lose, MD;  Location: [facility];  Service: Neurosurgery;  Laterality: Bilateral;  Thoracic eleven Kyphoplasty  . Av fistula placement Right 04/30/2014    Procedure: BRACHIOCEPHALIC ARTERIOVENOUS (AV) FISTULA CREATION;  Surgeon: Conrad Darlington, MD;  Location: [facility];  Service: Vascular;  Laterality: Right;  . Portacath placement  Left 02/18/2015    Procedure: INSERTION OF PORT A CATH RIGHT INTERNAL JUGULAR VEIN ;  Surgeon: Conrad Rodeo, MD;  Location: [facility];  Service: Vascular;  Laterality: Left;  . Cataract extraction w/phaco Right 04/15/2015    Procedure: CATARACT EXTRACTION PHACO AND INTRAOCULAR LENS PLACEMENT (IOC);  Surgeon: Tonny Branch, MD;  Location: [facility];  Service: Ophthalmology;  Laterality: Right;  CDE:8.97  . Cataract extraction w/phaco Left 04/29/2015    Procedure: CATARACT EXTRACTION PHACO AND INTRAOCULAR LENS PLACEMENT ; CDE:  7.61;  Surgeon: Tonny Branch, MD;  Location: [facility];  Service: Ophthalmology;  Laterality: Left;    Family History  Problem Relation Age of Onset  . Cancer Sister   . Cancer Brother     Social History:  reports that he has never smoked. He has never used smokeless tobacco. He reports that he does not drink alcohol or use illicit drugs.  Allergies:  Allergies  Allergen Reactions  . Pamidronate Anaphylaxis    blood pressure     Medications: I have reviewed the patient's current medications.  Results for orders placed or performed during the hospital encounter of 07/29/15 (from the past 48 hour(s))  Type and screen     Status: None (Preliminary result)   Collection Time: 07/29/15  7:20 PM  Result Value Ref Range   ABO/RH(D) A POS    Antibody Screen NEG    Sample Expiration 08/01/2015    Unit Number [id number]    Blood Component Type RBC, LR IRR    Unit division 00  Status of Unit ISSUED    Transfusion Status OK TO TRANSFUSE    Crossmatch Result Compatible   Prepare RBC     Status: None   Collection Time: 07/29/15  7:20 PM  Result Value Ref Range   Order Confirmation ORDER PROCESSED BY BLOOD BANK   Prepare Pheresed Platelets     Status: None   Collection Time: 07/29/15  7:45 PM  Result Value Ref Range   Unit Number [id number]    Blood Component Type PLTPH LI2 PAS    Unit division 00    Status of Unit ISSUED,FINAL    Transfusion Status OK TO TRANSFUSE    CBC with Differential/Platelet     Status: Abnormal   Collection Time: 07/30/15  7:52 AM  Result Value Ref Range   WBC 1.2 (LL) 4.0 - 10.5 K/uL    Comment: RESULT REPEATED AND VERIFIED WHITE COUNT CONFIRMED ON SMEAR CRITICAL RESULT CALLED TO, READ BACK BY AND VERIFIED WITH: Orvan Seen RN [id number] AT 0825 BY RESSEGGER R    RBC 2.37 (L) 4.22 - 5.81 MIL/uL   Hemoglobin 8.4 (L) 13.0 - 17.0 g/dL   HCT 25.1 (L) 39.0 - 52.0 %   MCV 105.9 (H) 78.0 - 100.0 fL   MCH 35.4 (H) 26.0 - 34.0 pg   MCHC 33.5 30.0 - 36.0 g/dL   RDW 26.8 (H) 11.5 - 15.5 %   Platelets 20 (LL) 150 - 400 K/uL    Comment: RESULT REPEATED AND VERIFIED PLATELET COUNT CONFIRMED BY SMEAR CRITICAL RESULT CALLED TO, READ BACK BY AND VERIFIED WITH: Orvan Seen ON [id number] AT 0820 B YRESSEGGER R    Neutrophils Relative % 37 %   Neutro Abs 0.4 (L) 1.7 - 7.7 K/uL   Lymphocytes Relative 54 %   Lymphs Abs 0.6 (L) 0.7 - 4.0 K/uL   Monocytes Relative 6 %   Monocytes Absolute 0.1 0.1 - 1.0 K/uL   Eosinophils Relative 3 %   Eosinophils Absolute 0.0 0.0 - 0.7 K/uL   Basophils Relative 0 %   Basophils Absolute 0.0 0.0 - 0.1 K/uL    No results found.  Review of Systems  Constitutional: Positive for malaise/fatigue.  Respiratory: Negative for shortness of breath.   Cardiovascular: Positive for leg swelling.  Gastrointestinal: Negative for nausea and vomiting.  Neurological: Positive for weakness.   Blood pressure 161/105, pulse 78, temperature 98.1 F (36.7 C), temperature source Oral, resp. rate 18, height _0  (1.778 m), weight 245 lb (111.131 kg), SpO2 99 %. Physical Exam  Assessment/Plan: Problem #1 anemia: This is a part of pancytopenia. Presently he has received 1 unit of blood transfusion and his hemoglobin has come up to 8.4. Patient seems to be feeling better. Problem #2 pancytopenia: Patient with history of multiple myeloma, status post chemotherapy and bone marrow transplant. Presently he had a bone marrow  biopsy which is pending to see whether he has recurrence of his multiple myeloma. Patient with recurrent anemia and thrombocytopenia. Problem #3 end-stage renal disease: He is status post hemodialysis on Saturday. Patient is due for dialysis today Problem #4 hypertension: His blood pressure is reasonably controlled Problem #5 metabolic bone disease: His calcium is in range Problem #6 fluid management patient patient with some edema but no significant. And he is asymptomatic. Plan: We'll make arrangements for patient to get dialysis today 2] we'll use CK/2.5 calcium bath for 4 hours 3] we will remove 3 L if his systolic blood pressure is above 90. 3] we'll check his renal  panel in the morning.  Elsia Lasota S 07/30/2015, 2:33 PM

## 2015-07-30 NOTE — Care Management Note (Signed)
Case Management Note  Patient Details  Name: [name] MRN: [id number] Date of Birth: 1946-01-28  Subjective/Objective:      Spoke with patient for discharge planning. Patient is alert and oriented independent, from home. No DME and has insurance. No Cm needs identified.              Action/Plan:Home with self care.,   Expected Discharge Date:  07/30/15               Expected Discharge Plan:  Home/Self Care  In-House Referral:     Discharge planning Services  CM Consult  Post Acute Care Choice:  NA Choice offered to:  NA  DME Arranged:  N/A DME Agency:  NA  HH Arranged:    [place] Agency:  NA  Status of Service:  Completed, signed off  Medicare Important Message Given:    Date Medicare IM Given:    Medicare IM give by:    Date Additional Medicare IM Given:    Additional Medicare Important Message give by:     If discussed at [place] of Stay Meetings, dates discussed:    Additional Comments:  Alvie Heidelberg, RN 07/30/2015, 11:16 AM

## 2015-07-31 ENCOUNTER — Ambulatory Visit (HOSPITAL_COMMUNITY): Payer: Medicare Other | Admitting: Oncology

## 2015-07-31 ENCOUNTER — Encounter (HOSPITAL_BASED_OUTPATIENT_CLINIC_OR_DEPARTMENT_OTHER): Payer: Medicare Other

## 2015-07-31 DIAGNOSIS — C9000 Multiple myeloma not having achieved remission: Secondary | ICD-10-CM | POA: Diagnosis not present

## 2015-07-31 DIAGNOSIS — Z5189 Encounter for other specified aftercare: Secondary | ICD-10-CM

## 2015-07-31 LAB — IMMUNOFIXATION ELECTROPHORESIS
IGG (IMMUNOGLOBIN G), SERUM: 818 mg/dL (ref 700–1600)
IgA: 351 mg/dL (ref 61–437)
IgM, Serum: 79 mg/dL (ref 20–172)
TOTAL PROTEIN ELP: 5.9 g/dL — AB (ref 6.0–8.5)

## 2015-07-31 LAB — BETA 2 MICROGLOBULIN, SERUM: BETA 2 MICROGLOBULIN: 19.1 mg/L — AB (ref 0.6–2.4)

## 2015-07-31 LAB — TYPE AND SCREEN
ABO/RH(D): A POS
Antibody Screen: NEGATIVE
UNIT DIVISION: 0

## 2015-07-31 MED ORDER — PEGFILGRASTIM INJECTION 6 MG/0.6ML ~~LOC~~
PREFILLED_SYRINGE | SUBCUTANEOUS | Status: AC
  Filled 2015-07-31: qty 0.6

## 2015-07-31 MED ORDER — PEGFILGRASTIM INJECTION 6 MG/0.6ML ~~LOC~~
6.0000 mg | PREFILLED_SYRINGE | Freq: Once | SUBCUTANEOUS | Status: AC
  Administered 2015-07-31: 6 mg via SUBCUTANEOUS

## 2015-07-31 NOTE — Progress Notes (Signed)
[name] presents today for injection per MD orders. Neulasta 6mg  administered SQ in left Abdomen. Administration without incident. Patient tolerated well.

## 2015-07-31 NOTE — Patient Instructions (Signed)
[place] at [facility] Discharge Instructions  RECOMMENDATIONS MADE BY THE CONSULTANT AND ANY TEST RESULTS WILL BE SENT TO YOUR REFERRING PHYSICIAN.  Neulasta 6 mg injection given as ordered.  Thank you for choosing [place] at [facility] to provide your oncology and hematology care.  To afford each patient quality time with our provider, please arrive at least 15 minutes before your scheduled appointment time.   Beginning January 23rd 2017 lab work for the [facility] will be done in the  Main lab at [facility] on 1st floor. If you have a lab appointment with the [facility] please come in thru the  Main Entrance and check in at the main information desk  You need to re-schedule your appointment should you arrive 10 or more minutes late.  We strive to give you quality time with our providers, and arriving late affects you and other patients whose appointments are after yours.  Also, if you no show three or more times for appointments you may be dismissed from the clinic at the providers discretion.     Again, thank you for choosing [facility].  Our hope is that these requests will decrease the amount of time that you wait before being seen by our physicians.       _____________________________________________________________  Should you have questions after your visit to [facility], please contact our office at [phone] between the hours of 8:30 a.m. and 4:30 p.m.  Voicemails left after 4:30 p.m. will not be returned until the following business day.  For prescription refill requests, have your pharmacy contact our office.         Resources For Cancer Patients and their Caregivers ? American Cancer Society: Can assist with transportation, wigs, general needs, runs Look Good Feel Better.        818-812-2798 ? Cancer Care: Provides financial assistance, online support groups, medication/co-pay  assistance.  1-800-813-HOPE 5153857698) ? [place] Assists [place] cancer patients and their families through emotional , educational and financial support.  628-334-7786 ? [place] DSS Where to apply for food stamps, Medicaid and utility assistance. 310-010-6422 ? [facility]: Transportation to medical appointments. (810) 244-2909 ? Social Security Administration: May apply for disability if have a Stage IV cancer. (201)176-5958 (365)780-3649 ? [employer], Disability and Transit Services: Assists with nutrition, care and transit needs. ([phone]

## 2015-08-05 ENCOUNTER — Encounter (HOSPITAL_COMMUNITY): Payer: Medicare Other

## 2015-08-05 ENCOUNTER — Telehealth (HOSPITAL_COMMUNITY): Payer: Self-pay | Admitting: *Deleted

## 2015-08-05 ENCOUNTER — Other Ambulatory Visit (HOSPITAL_COMMUNITY): Payer: Medicare Other

## 2015-08-05 ENCOUNTER — Other Ambulatory Visit (HOSPITAL_COMMUNITY): Payer: Self-pay | Admitting: Oncology

## 2015-08-05 ENCOUNTER — Encounter (HOSPITAL_BASED_OUTPATIENT_CLINIC_OR_DEPARTMENT_OTHER): Payer: Medicare Other

## 2015-08-05 VITALS — BP 165/101 | HR 75 | Temp 97.4°F | Resp 18

## 2015-08-05 DIAGNOSIS — D696 Thrombocytopenia, unspecified: Secondary | ICD-10-CM

## 2015-08-05 DIAGNOSIS — N189 Chronic kidney disease, unspecified: Secondary | ICD-10-CM | POA: Diagnosis not present

## 2015-08-05 DIAGNOSIS — C9000 Multiple myeloma not having achieved remission: Secondary | ICD-10-CM | POA: Diagnosis not present

## 2015-08-05 DIAGNOSIS — D631 Anemia in chronic kidney disease: Secondary | ICD-10-CM

## 2015-08-05 LAB — CBC WITH DIFFERENTIAL/PLATELET
BASOS PCT: 0 %
Basophils Absolute: 0 10*3/uL (ref 0.0–0.1)
EOS PCT: 1 %
Eosinophils Absolute: 0.1 10*3/uL (ref 0.0–0.7)
HEMATOCRIT: 23.9 % — AB (ref 39.0–52.0)
Hemoglobin: 7.8 g/dL — ABNORMAL LOW (ref 13.0–17.0)
Lymphocytes Relative: 17 %
Lymphs Abs: 0.8 10*3/uL (ref 0.7–4.0)
MCH: 36.1 pg — AB (ref 26.0–34.0)
MCHC: 32.6 g/dL (ref 30.0–36.0)
MCV: 110.6 fL — AB (ref 78.0–100.0)
MONO ABS: 0.3 10*3/uL (ref 0.1–1.0)
Monocytes Relative: 7 %
NEUTROS ABS: 3.7 10*3/uL (ref 1.7–7.7)
NEUTROS PCT: 75 %
PLATELETS: 6 10*3/uL — AB (ref 150–400)
RBC: 2.16 MIL/uL — ABNORMAL LOW (ref 4.22–5.81)
RDW: 24.5 % — AB (ref 11.5–15.5)
WBC: 4.9 10*3/uL (ref 4.0–10.5)

## 2015-08-05 LAB — COMPREHENSIVE METABOLIC PANEL
ALBUMIN: 3.4 g/dL — AB (ref 3.5–5.0)
ALK PHOS: 43 U/L (ref 38–126)
ALT: 19 U/L (ref 17–63)
ANION GAP: 10 (ref 5–15)
AST: 19 U/L (ref 15–41)
BUN: 44 mg/dL — ABNORMAL HIGH (ref 6–20)
CHLORIDE: 104 mmol/L (ref 101–111)
CO2: 28 mmol/L (ref 22–32)
Calcium: 9.3 mg/dL (ref 8.9–10.3)
Creatinine, Ser: 5.64 mg/dL — ABNORMAL HIGH (ref 0.61–1.24)
GFR calc non Af Amer: 9 mL/min — ABNORMAL LOW (ref >60)
GFR, EST AFRICAN AMERICAN: 11 mL/min — AB (ref >60)
GLUCOSE: 142 mg/dL — AB (ref 65–99)
POTASSIUM: 4.1 mmol/L (ref 3.5–5.1)
SODIUM: 142 mmol/L (ref 135–145)
Total Bilirubin: 1.3 mg/dL — ABNORMAL HIGH (ref 0.3–1.2)
Total Protein: 6.5 g/dL (ref 6.5–8.1)

## 2015-08-05 LAB — PREPARE RBC (CROSSMATCH)

## 2015-08-05 MED ORDER — HEPARIN SOD (PORK) LOCK FLUSH 100 UNIT/ML IV SOLN
500.0000 [IU] | Freq: Every day | INTRAVENOUS | Status: AC | PRN
  Administered 2015-08-05: 500 [IU]

## 2015-08-05 MED ORDER — HEPARIN SOD (PORK) LOCK FLUSH 100 UNIT/ML IV SOLN
INTRAVENOUS | Status: AC
  Filled 2015-08-05: qty 5

## 2015-08-05 MED ORDER — ACETAMINOPHEN 325 MG PO TABS
650.0000 mg | ORAL_TABLET | Freq: Once | ORAL | Status: AC
  Administered 2015-08-05: 650 mg via ORAL

## 2015-08-05 MED ORDER — DIPHENHYDRAMINE HCL 25 MG PO CAPS
25.0000 mg | ORAL_CAPSULE | Freq: Once | ORAL | Status: AC
  Administered 2015-08-05: 25 mg via ORAL

## 2015-08-05 MED ORDER — ACETAMINOPHEN 325 MG PO TABS
ORAL_TABLET | ORAL | Status: AC
  Filled 2015-08-05: qty 2

## 2015-08-05 MED ORDER — SODIUM CHLORIDE 0.9 % IV SOLN
250.0000 mL | Freq: Once | INTRAVENOUS | Status: AC
  Administered 2015-08-05: 250 mL via INTRAVENOUS

## 2015-08-05 MED ORDER — DIPHENHYDRAMINE HCL 25 MG PO CAPS
ORAL_CAPSULE | ORAL | Status: AC
  Filled 2015-08-05: qty 1

## 2015-08-05 MED ORDER — SODIUM CHLORIDE 0.9% FLUSH
10.0000 mL | INTRAVENOUS | Status: AC | PRN
  Administered 2015-08-05: 10 mL

## 2015-08-05 NOTE — Progress Notes (Signed)
Patient tolerated transfusion well.  VSS throughout.

## 2015-08-05 NOTE — Telephone Encounter (Signed)
..  CRITICAL VALUE ALERT Critical value received:  Platelets 6000 Date of notification:  08/05/2015 Time of notification: [id number] Critical value read back:  Yes.   Nurse who received alert:  Tar MD notified (1st page):  Caroleen Hamman PAC

## 2015-08-05 NOTE — Patient Instructions (Signed)
[place] at [facility] Discharge Instructions  RECOMMENDATIONS MADE BY THE CONSULTANT AND ANY TEST RESULTS WILL BE SENT TO YOUR REFERRING PHYSICIAN.  One unit of platelets and one unit of blood.    Thank you for choosing [place] at [facility] to provide your oncology and hematology care.  To afford each patient quality time with our provider, please arrive at least 15 minutes before your scheduled appointment time.   Beginning January 23rd 2017 lab work for the [facility] will be done in the  Main lab at [facility] on 1st floor. If you have a lab appointment with the [facility] please come in thru the  Main Entrance and check in at the main information desk  You need to re-schedule your appointment should you arrive 10 or more minutes late.  We strive to give you quality time with our providers, and arriving late affects you and other patients whose appointments are after yours.  Also, if you no show three or more times for appointments you may be dismissed from the clinic at the providers discretion.     Again, thank you for choosing [facility].  Our hope is that these requests will decrease the amount of time that you wait before being seen by our physicians.       _____________________________________________________________  Should you have questions after your visit to [facility], please contact our office at [phone] between the hours of 8:30 a.m. and 4:30 p.m.  Voicemails left after 4:30 p.m. will not be returned until the following business day.  For prescription refill requests, have your pharmacy contact our office.         Resources For Cancer Patients and their Caregivers ? American Cancer Society: Can assist with transportation, wigs, general needs, runs Look Good Feel Better.        220-612-6678 ? Cancer Care: Provides financial assistance, online support groups,  medication/co-pay assistance.  1-800-813-HOPE 7321047044) ? [place] Assists [place] cancer patients and their families through emotional , educational and financial support.  336-808-2865 ? [place] DSS Where to apply for food stamps, Medicaid and utility assistance. (201)476-3859 ? [employer]: Transportation to medical appointments. (347)216-8188 ? Social Security Administration: May apply for disability if have a Stage IV cancer. 334-518-3896 (224)621-3979 ? [employer], Disability and Transit Services: Assists with nutrition, care and transit needs. (978)206-3565

## 2015-08-06 LAB — TYPE AND SCREEN
ABO/RH(D): A POS
ANTIBODY SCREEN: NEGATIVE
Unit division: 0

## 2015-08-06 LAB — PREPARE PLATELET PHERESIS: UNIT DIVISION: 0

## 2015-08-12 ENCOUNTER — Encounter (HOSPITAL_COMMUNITY): Payer: Medicare Other

## 2015-08-12 ENCOUNTER — Other Ambulatory Visit (HOSPITAL_COMMUNITY): Payer: Medicare Other

## 2015-08-12 ENCOUNTER — Encounter (HOSPITAL_BASED_OUTPATIENT_CLINIC_OR_DEPARTMENT_OTHER): Payer: Medicare Other

## 2015-08-12 DIAGNOSIS — C9000 Multiple myeloma not having achieved remission: Secondary | ICD-10-CM

## 2015-08-12 LAB — COMPREHENSIVE METABOLIC PANEL
ALK PHOS: 52 U/L (ref 38–126)
ALT: 19 U/L (ref 17–63)
AST: 22 U/L (ref 15–41)
Albumin: 3.4 g/dL — ABNORMAL LOW (ref 3.5–5.0)
Anion gap: 10 (ref 5–15)
BILIRUBIN TOTAL: 0.8 mg/dL (ref 0.3–1.2)
BUN: 45 mg/dL — AB (ref 6–20)
CALCIUM: 8.9 mg/dL (ref 8.9–10.3)
CHLORIDE: 105 mmol/L (ref 101–111)
CO2: 27 mmol/L (ref 22–32)
CREATININE: 5.75 mg/dL — AB (ref 0.61–1.24)
GFR, EST AFRICAN AMERICAN: 11 mL/min — AB (ref >60)
GFR, EST NON AFRICAN AMERICAN: 9 mL/min — AB (ref >60)
Glucose, Bld: 126 mg/dL — ABNORMAL HIGH (ref 65–99)
Potassium: 4.3 mmol/L (ref 3.5–5.1)
Sodium: 142 mmol/L (ref 135–145)
Total Protein: 6.6 g/dL (ref 6.5–8.1)

## 2015-08-12 LAB — CBC WITH DIFFERENTIAL/PLATELET
BASOS PCT: 0 %
Basophils Absolute: 0 10*3/uL (ref 0.0–0.1)
Eosinophils Absolute: 0 10*3/uL (ref 0.0–0.7)
Eosinophils Relative: 1 %
HEMATOCRIT: 24.2 % — AB (ref 39.0–52.0)
HEMOGLOBIN: 8 g/dL — AB (ref 13.0–17.0)
LYMPHS ABS: 1 10*3/uL (ref 0.7–4.0)
Lymphocytes Relative: 22 %
MCH: 36.2 pg — AB (ref 26.0–34.0)
MCHC: 33.1 g/dL (ref 30.0–36.0)
MCV: 109.5 fL — AB (ref 78.0–100.0)
MONO ABS: 0.2 10*3/uL (ref 0.1–1.0)
MONOS PCT: 5 %
NEUTROS ABS: 3.2 10*3/uL (ref 1.7–7.7)
NEUTROS PCT: 73 %
Platelets: 8 10*3/uL — CL (ref 150–400)
RBC: 2.21 MIL/uL — ABNORMAL LOW (ref 4.22–5.81)
RDW: 24.5 % — AB (ref 11.5–15.5)
WBC: 4.4 10*3/uL (ref 4.0–10.5)

## 2015-08-12 LAB — SAMPLE TO BLOOD BANK

## 2015-08-12 LAB — PREPARE RBC (CROSSMATCH)

## 2015-08-12 MED ORDER — SODIUM CHLORIDE 0.9% FLUSH
10.0000 mL | INTRAVENOUS | Status: AC | PRN
  Administered 2015-08-12: 10 mL

## 2015-08-12 MED ORDER — SODIUM CHLORIDE 0.9 % IV SOLN
250.0000 mL | Freq: Once | INTRAVENOUS | Status: AC
  Administered 2015-08-12: 250 mL via INTRAVENOUS

## 2015-08-12 MED ORDER — ACETAMINOPHEN 325 MG PO TABS
650.0000 mg | ORAL_TABLET | Freq: Once | ORAL | Status: AC
  Administered 2015-08-12: 650 mg via ORAL

## 2015-08-12 MED ORDER — DIPHENHYDRAMINE HCL 25 MG PO CAPS
ORAL_CAPSULE | ORAL | Status: AC
  Filled 2015-08-12: qty 1

## 2015-08-12 MED ORDER — DIPHENHYDRAMINE HCL 25 MG PO CAPS
25.0000 mg | ORAL_CAPSULE | Freq: Once | ORAL | Status: AC
  Administered 2015-08-12: 25 mg via ORAL

## 2015-08-12 MED ORDER — ACETAMINOPHEN 325 MG PO TABS
ORAL_TABLET | ORAL | Status: AC
  Filled 2015-08-12: qty 2

## 2015-08-12 MED ORDER — HEPARIN SOD (PORK) LOCK FLUSH 100 UNIT/ML IV SOLN
500.0000 [IU] | Freq: Every day | INTRAVENOUS | Status: AC | PRN
  Administered 2015-08-12: 500 [IU]

## 2015-08-12 MED ORDER — HEPARIN SOD (PORK) LOCK FLUSH 100 UNIT/ML IV SOLN
INTRAVENOUS | Status: AC
  Filled 2015-08-12: qty 5

## 2015-08-12 NOTE — Patient Instructions (Signed)
[place] at [facility] Discharge Instructions  RECOMMENDATIONS MADE BY THE CONSULTANT AND ANY TEST RESULTS WILL BE SENT TO YOUR REFERRING PHYSICIAN.  Today you received a transfusion of 1 units packed red blood cells and 1 unit platelets. Return as scheduled.  Thank you for choosing [place] at [facility] to provide your oncology and hematology care.  To afford each patient quality time with our provider, please arrive at least 15 minutes before your scheduled appointment time.   Beginning January 23rd 2017 lab work for the [facility] will be done in the  Main lab at [facility] on 1st floor. If you have a lab appointment with the [facility] please come in thru the  Main Entrance and check in at the main information desk  You need to re-schedule your appointment should you arrive 10 or more minutes late.  We strive to give you quality time with our providers, and arriving late affects you and other patients whose appointments are after yours.  Also, if you no show three or more times for appointments you may be dismissed from the clinic at the providers discretion.     Again, thank you for choosing [facility].  Our hope is that these requests will decrease the amount of time that you wait before being seen by our physicians.       _____________________________________________________________  Should you have questions after your visit to [facility], please contact our office at [phone] between the hours of 8:30 a.m. and 4:30 p.m.  Voicemails left after 4:30 p.m. will not be returned until the following business day.  For prescription refill requests, have your pharmacy contact our office.         Resources For Cancer Patients and their Caregivers ? American Cancer Society: Can assist with transportation, wigs, general needs, runs Look Good Feel Better.        714-794-7018 ? Cancer  Care: Provides financial assistance, online support groups, medication/co-pay assistance.  1-800-813-HOPE (480)366-5299) ? [facility] Assists [place] cancer patients and their families through emotional , educational and financial support.  (610)354-6232 ? [place] DSS Where to apply for food stamps, Medicaid and utility assistance. (510)711-7076 ? [employer]: Transportation to medical appointments. 760-219-0953 ? Social Security Administration: May apply for disability if have a Stage IV cancer. ([phone] [phone] ? [employer], Disability and Transit Services: Assists with nutrition, care and transit needs. [phone]

## 2015-08-12 NOTE — Progress Notes (Signed)
Tolerated packed red blood cell transfusion well. Tolerated platelet transfusion well. Ambulatory on discharge home to self.

## 2015-08-12 NOTE — Progress Notes (Unsigned)
CRITICAL VALUE ALERT Critical value received:  Platelets 8,000 Date of notification:  08/12/15 Time of notification: 0911 Critical value read back:  Yes.   Nurse who received alert:  T.Aws Shere,RN MD notified (1st page):  [phone] verbal to T.Kefalas,PA

## 2015-08-13 LAB — PREPARE PLATELET PHERESIS: UNIT DIVISION: 0

## 2015-08-13 LAB — TYPE AND SCREEN
ABO/RH(D): A POS
ANTIBODY SCREEN: NEGATIVE
Unit division: 0

## 2015-08-14 ENCOUNTER — Encounter (HOSPITAL_COMMUNITY): Payer: Self-pay

## 2015-08-19 ENCOUNTER — Encounter (HOSPITAL_BASED_OUTPATIENT_CLINIC_OR_DEPARTMENT_OTHER): Payer: Medicare Other

## 2015-08-19 ENCOUNTER — Encounter (HOSPITAL_COMMUNITY): Payer: Medicare Other | Attending: Oncology

## 2015-08-19 ENCOUNTER — Ambulatory Visit (HOSPITAL_COMMUNITY): Payer: Medicare Other | Admitting: Oncology

## 2015-08-19 ENCOUNTER — Other Ambulatory Visit (HOSPITAL_COMMUNITY): Payer: Medicare Other

## 2015-08-19 ENCOUNTER — Telehealth (HOSPITAL_COMMUNITY): Payer: Self-pay | Admitting: *Deleted

## 2015-08-19 ENCOUNTER — Other Ambulatory Visit (HOSPITAL_COMMUNITY): Payer: Self-pay | Admitting: Oncology

## 2015-08-19 VITALS — BP 132/79 | HR 75 | Temp 97.7°F | Resp 20

## 2015-08-19 DIAGNOSIS — C9000 Multiple myeloma not having achieved remission: Secondary | ICD-10-CM | POA: Diagnosis present

## 2015-08-19 DIAGNOSIS — D696 Thrombocytopenia, unspecified: Secondary | ICD-10-CM

## 2015-08-19 LAB — COMPREHENSIVE METABOLIC PANEL
ALK PHOS: 45 U/L (ref 38–126)
ALT: 19 U/L (ref 17–63)
ANION GAP: 9 (ref 5–15)
AST: 21 U/L (ref 15–41)
Albumin: 3.4 g/dL — ABNORMAL LOW (ref 3.5–5.0)
BILIRUBIN TOTAL: 0.8 mg/dL (ref 0.3–1.2)
BUN: 49 mg/dL — ABNORMAL HIGH (ref 6–20)
CALCIUM: 8.7 mg/dL — AB (ref 8.9–10.3)
CO2: 29 mmol/L (ref 22–32)
CREATININE: 5.9 mg/dL — AB (ref 0.61–1.24)
Chloride: 104 mmol/L (ref 101–111)
GFR calc non Af Amer: 9 mL/min — ABNORMAL LOW (ref >60)
GFR, EST AFRICAN AMERICAN: 10 mL/min — AB (ref >60)
GLUCOSE: 140 mg/dL — AB (ref 65–99)
Potassium: 4.9 mmol/L (ref 3.5–5.1)
Sodium: 142 mmol/L (ref 135–145)
TOTAL PROTEIN: 6.6 g/dL (ref 6.5–8.1)

## 2015-08-19 LAB — CBC WITH DIFFERENTIAL/PLATELET
Basophils Absolute: 0 10*3/uL (ref 0.0–0.1)
Basophils Relative: 0 %
Eosinophils Absolute: 0.1 10*3/uL (ref 0.0–0.7)
Eosinophils Relative: 2 %
HEMATOCRIT: 25.1 % — AB (ref 39.0–52.0)
HEMOGLOBIN: 8.3 g/dL — AB (ref 13.0–17.0)
LYMPHS ABS: 1.1 10*3/uL (ref 0.7–4.0)
LYMPHS PCT: 27 %
MCH: 36.4 pg — AB (ref 26.0–34.0)
MCHC: 33.1 g/dL (ref 30.0–36.0)
MCV: 110.1 fL — ABNORMAL HIGH (ref 78.0–100.0)
MONOS PCT: 6 %
Monocytes Absolute: 0.2 10*3/uL (ref 0.1–1.0)
NEUTROS ABS: 2.7 10*3/uL (ref 1.7–7.7)
NEUTROS PCT: 65 %
Platelets: 10 10*3/uL — CL (ref 150–400)
RBC: 2.28 MIL/uL — AB (ref 4.22–5.81)
RDW: 24.4 % — ABNORMAL HIGH (ref 11.5–15.5)
WBC: 4.1 10*3/uL (ref 4.0–10.5)

## 2015-08-19 MED ORDER — ACETAMINOPHEN 325 MG PO TABS
650.0000 mg | ORAL_TABLET | Freq: Once | ORAL | Status: AC
  Administered 2015-08-19: 650 mg via ORAL

## 2015-08-19 MED ORDER — HEPARIN SOD (PORK) LOCK FLUSH 100 UNIT/ML IV SOLN
500.0000 [IU] | Freq: Every day | INTRAVENOUS | Status: AC | PRN
  Administered 2015-08-19: 500 [IU]

## 2015-08-19 MED ORDER — SODIUM CHLORIDE 0.9 % IV SOLN
250.0000 mL | Freq: Once | INTRAVENOUS | Status: AC
  Administered 2015-08-19: 250 mL via INTRAVENOUS

## 2015-08-19 MED ORDER — HEPARIN SOD (PORK) LOCK FLUSH 100 UNIT/ML IV SOLN
INTRAVENOUS | Status: AC
  Filled 2015-08-19: qty 5

## 2015-08-19 MED ORDER — DIPHENHYDRAMINE HCL 25 MG PO CAPS
25.0000 mg | ORAL_CAPSULE | Freq: Once | ORAL | Status: AC
  Administered 2015-08-19: 25 mg via ORAL

## 2015-08-19 MED ORDER — SODIUM CHLORIDE 0.9% FLUSH
10.0000 mL | INTRAVENOUS | Status: AC | PRN
  Administered 2015-08-19: 10 mL

## 2015-08-19 MED ORDER — ACETAMINOPHEN 325 MG PO TABS
ORAL_TABLET | ORAL | Status: AC
  Filled 2015-08-19: qty 2

## 2015-08-19 MED ORDER — DIPHENHYDRAMINE HCL 25 MG PO CAPS
ORAL_CAPSULE | ORAL | Status: AC
  Filled 2015-08-19: qty 1

## 2015-08-19 NOTE — Progress Notes (Signed)
Tolerated platelet transfusion well. Ambulatory on discharge home to self. 

## 2015-08-19 NOTE — Telephone Encounter (Signed)
CRITICAL VALUE ALERT Critical value received:  Platelets 10000 Date of notification:  08/19/2015 Time of notification: 0921 Critical value read back:  Yes.   Nurse who received alert:  TAR MD notified (1st page):  T.Kefalas PA-C

## 2015-08-19 NOTE — Patient Instructions (Signed)
[place] at [facility] Discharge Instructions  RECOMMENDATIONS MADE BY THE CONSULTANT AND ANY TEST RESULTS WILL BE SENT TO YOUR REFERRING PHYSICIAN.  Today you received a platelet transfusion.  Thank you for choosing [place] at [facility] to provide your oncology and hematology care.  To afford each patient quality time with our provider, please arrive at least 15 minutes before your scheduled appointment time.   Beginning January 23rd 2017 lab work for the [facility] will be done in the  Main lab at [facility] on 1st floor. If you have a lab appointment with the [facility] please come in thru the  Main Entrance and check in at the main information desk  You need to re-schedule your appointment should you arrive 10 or more minutes late.  We strive to give you quality time with our providers, and arriving late affects you and other patients whose appointments are after yours.  Also, if you no show three or more times for appointments you may be dismissed from the clinic at the providers discretion.     Again, thank you for choosing [facility].  Our hope is that these requests will decrease the amount of time that you wait before being seen by our physicians.       _____________________________________________________________  Should you have questions after your visit to [facility], please contact our office at [phone] between the hours of 8:30 a.m. and 4:30 p.m.  Voicemails left after 4:30 p.m. will not be returned until the following business day.  For prescription refill requests, have your pharmacy contact our office.         Resources For Cancer Patients and their Caregivers ? American Cancer Society: Can assist with transportation, wigs, general needs, runs Look Good Feel Better.        ([phone] ? Cancer Care: Provides financial assistance, online support groups, medication/co-pay  assistance.  1-800-813-HOPE 778-399-6517) ? [facility] Assists [place] cancer patients and their families through emotional , educational and financial support.  484-180-1784 ? [place] DSS Where to apply for food stamps, Medicaid and utility assistance. 313-814-1310 ? [employer]: Transportation to medical appointments. 720-343-2828 ? Social Security Administration: May apply for disability if have a Stage IV cancer. 873-047-5321 602-085-4906 ? [employer], Disability and Transit Services: Assists with nutrition, care and transit needs. 831-800-6606

## 2015-08-20 LAB — PREPARE PLATELET PHERESIS: Unit division: 0

## 2015-08-27 ENCOUNTER — Other Ambulatory Visit (HOSPITAL_COMMUNITY): Payer: Self-pay | Admitting: Oncology

## 2015-08-28 ENCOUNTER — Encounter (HOSPITAL_COMMUNITY): Payer: Self-pay

## 2015-08-28 ENCOUNTER — Other Ambulatory Visit (HOSPITAL_COMMUNITY): Payer: Medicare Other

## 2015-08-28 ENCOUNTER — Telehealth (HOSPITAL_COMMUNITY): Payer: Self-pay | Admitting: *Deleted

## 2015-08-28 ENCOUNTER — Encounter (HOSPITAL_COMMUNITY): Payer: Medicare Other

## 2015-08-28 ENCOUNTER — Other Ambulatory Visit (HOSPITAL_COMMUNITY): Payer: Self-pay | Admitting: Oncology

## 2015-08-28 ENCOUNTER — Encounter (HOSPITAL_BASED_OUTPATIENT_CLINIC_OR_DEPARTMENT_OTHER): Payer: Medicare Other

## 2015-08-28 VITALS — BP 131/86 | HR 74 | Temp 97.7°F | Resp 18

## 2015-08-28 DIAGNOSIS — D649 Anemia, unspecified: Secondary | ICD-10-CM

## 2015-08-28 DIAGNOSIS — C9000 Multiple myeloma not having achieved remission: Secondary | ICD-10-CM

## 2015-08-28 DIAGNOSIS — D696 Thrombocytopenia, unspecified: Secondary | ICD-10-CM

## 2015-08-28 LAB — COMPREHENSIVE METABOLIC PANEL
ALBUMIN: 3.4 g/dL — AB (ref 3.5–5.0)
ALK PHOS: 45 U/L (ref 38–126)
ALT: 16 U/L — ABNORMAL LOW (ref 17–63)
AST: 19 U/L (ref 15–41)
Anion gap: 8 (ref 5–15)
BILIRUBIN TOTAL: 0.8 mg/dL (ref 0.3–1.2)
BUN: 35 mg/dL — AB (ref 6–20)
CALCIUM: 8.2 mg/dL — AB (ref 8.9–10.3)
CO2: 29 mmol/L (ref 22–32)
Chloride: 101 mmol/L (ref 101–111)
Creatinine, Ser: 4.97 mg/dL — ABNORMAL HIGH (ref 0.61–1.24)
GFR calc Af Amer: 13 mL/min — ABNORMAL LOW (ref >60)
GFR calc non Af Amer: 11 mL/min — ABNORMAL LOW (ref >60)
GLUCOSE: 98 mg/dL (ref 65–99)
POTASSIUM: 4.3 mmol/L (ref 3.5–5.1)
Sodium: 138 mmol/L (ref 135–145)
TOTAL PROTEIN: 6.6 g/dL (ref 6.5–8.1)

## 2015-08-28 LAB — CBC WITH DIFFERENTIAL/PLATELET
BASOS ABS: 0 10*3/uL (ref 0.0–0.1)
BASOS PCT: 0 %
Eosinophils Absolute: 0.1 10*3/uL (ref 0.0–0.7)
Eosinophils Relative: 2 %
HEMATOCRIT: 22.9 % — AB (ref 39.0–52.0)
HEMOGLOBIN: 7.6 g/dL — AB (ref 13.0–17.0)
Lymphocytes Relative: 29 %
Lymphs Abs: 0.8 10*3/uL (ref 0.7–4.0)
MCH: 37.3 pg — ABNORMAL HIGH (ref 26.0–34.0)
MCHC: 33.2 g/dL (ref 30.0–36.0)
MCV: 112.3 fL — ABNORMAL HIGH (ref 78.0–100.0)
Monocytes Absolute: 0.3 10*3/uL (ref 0.1–1.0)
Monocytes Relative: 10 %
NEUTROS ABS: 1.6 10*3/uL — AB (ref 1.7–7.7)
NEUTROS PCT: 59 %
Platelets: 9 10*3/uL — CL (ref 150–400)
RBC: 2.04 MIL/uL — ABNORMAL LOW (ref 4.22–5.81)
RDW: 25.2 % — ABNORMAL HIGH (ref 11.5–15.5)
WBC: 2.8 10*3/uL — ABNORMAL LOW (ref 4.0–10.5)

## 2015-08-28 LAB — PREPARE RBC (CROSSMATCH)

## 2015-08-28 LAB — LACTATE DEHYDROGENASE: LDH: 199 U/L — AB (ref 98–192)

## 2015-08-28 LAB — SAMPLE TO BLOOD BANK

## 2015-08-28 LAB — SEDIMENTATION RATE: SED RATE: 30 mm/h — AB (ref 0–16)

## 2015-08-28 MED ORDER — DIPHENHYDRAMINE HCL 25 MG PO CAPS
ORAL_CAPSULE | ORAL | Status: AC
  Filled 2015-08-28: qty 1

## 2015-08-28 MED ORDER — DIPHENHYDRAMINE HCL 25 MG PO CAPS
25.0000 mg | ORAL_CAPSULE | Freq: Once | ORAL | Status: AC
  Administered 2015-08-28: 25 mg via ORAL

## 2015-08-28 MED ORDER — SODIUM CHLORIDE 0.9% FLUSH
10.0000 mL | INTRAVENOUS | Status: AC | PRN
  Administered 2015-08-28: 10 mL

## 2015-08-28 MED ORDER — ACETAMINOPHEN 325 MG PO TABS
ORAL_TABLET | ORAL | Status: AC
  Filled 2015-08-28: qty 2

## 2015-08-28 MED ORDER — ACETAMINOPHEN 325 MG PO TABS
650.0000 mg | ORAL_TABLET | Freq: Once | ORAL | Status: AC
  Administered 2015-08-28: 650 mg via ORAL

## 2015-08-28 MED ORDER — HEPARIN SOD (PORK) LOCK FLUSH 100 UNIT/ML IV SOLN
INTRAVENOUS | Status: AC
  Filled 2015-08-28: qty 5

## 2015-08-28 MED ORDER — HEPARIN SOD (PORK) LOCK FLUSH 100 UNIT/ML IV SOLN
500.0000 [IU] | Freq: Every day | INTRAVENOUS | Status: AC | PRN
  Administered 2015-08-28: 500 [IU]

## 2015-08-28 MED ORDER — SODIUM CHLORIDE 0.9 % IV SOLN
250.0000 mL | Freq: Once | INTRAVENOUS | Status: AC
  Administered 2015-08-28: 250 mL via INTRAVENOUS

## 2015-08-28 NOTE — Progress Notes (Signed)
Tolerated blood transfusion well.  Tolerated platelet transfusion well.  Ambulatory on discharge home to self.

## 2015-08-28 NOTE — Patient Instructions (Signed)
[place] at [facility] Discharge Instructions  RECOMMENDATIONS MADE BY THE CONSULTANT AND ANY TEST RESULTS WILL BE SENT TO YOUR REFERRING PHYSICIAN.  Today you received a transfusion of 1 unit packed red blood cells and 1 unit platelets. Return as scheduled.  Thank you for choosing [place] at [facility] to provide your oncology and hematology care.  To afford each patient quality time with our provider, please arrive at least 15 minutes before your scheduled appointment time.   Beginning January 23rd 2017 lab work for the [facility] will be done in the  Main lab at [facility] on 1st floor. If you have a lab appointment with the [facility] please come in thru the  Main Entrance and check in at the main information desk  You need to re-schedule your appointment should you arrive 10 or more minutes late.  We strive to give you quality time with our providers, and arriving late affects you and other patients whose appointments are after yours.  Also, if you no show three or more times for appointments you may be dismissed from the clinic at the providers discretion.     Again, thank you for choosing [facility].  Our hope is that these requests will decrease the amount of time that you wait before being seen by our physicians.       _____________________________________________________________  Should you have questions after your visit to [facility], please contact our office at [phone] between the hours of 8:30 a.m. and 4:30 p.m.  Voicemails left after 4:30 p.m. will not be returned until the following business day.  For prescription refill requests, have your pharmacy contact our office.         Resources For Cancer Patients and their Caregivers ? American Cancer Society: Can assist with transportation, wigs, general needs, runs Look Good Feel Better.        ([phone] [phone] ? Cancer  Care: Provides financial assistance, online support groups, medication/co-pay assistance.  1-800-813-HOPE 201-414-7631) ? [facility] Assists [place] cancer patients and their families through emotional , educational and financial support.  (819)142-7230 ? [place] DSS Where to apply for food stamps, Medicaid and utility assistance. (450)217-5823 ? [employer]: Transportation to medical appointments. 579-669-1389 ? Social Security Administration: May apply for disability if have a Stage IV cancer. 820-396-2027 289-367-4370 ? [employer], Disability and Transit Services: Assists with nutrition, care and transit needs. [phone]

## 2015-08-28 NOTE — Telephone Encounter (Signed)
..  CRITICAL VALUE ALERT Critical value received:  Platelets 9000 Date of notification:  08/28/2015 Time of notification: 0950 Critical value read back:  Yes.   Nurse who received alert:  Tar MD notified (1st page):  kefalas PA-C

## 2015-08-29 LAB — PROTEIN ELECTROPHORESIS, SERUM
A/G RATIO SPE: 1.3 (ref 0.7–1.7)
ALBUMIN ELP: 3.5 g/dL (ref 2.9–4.4)
ALPHA-1-GLOBULIN: 0.2 g/dL (ref 0.0–0.4)
Alpha-2-Globulin: 0.5 g/dL (ref 0.4–1.0)
BETA GLOBULIN: 0.9 g/dL (ref 0.7–1.3)
GAMMA GLOBULIN: 1.1 g/dL (ref 0.4–1.8)
Globulin, Total: 2.7 g/dL (ref 2.2–3.9)
Total Protein ELP: 6.2 g/dL (ref 6.0–8.5)

## 2015-08-29 LAB — TYPE AND SCREEN
ABO/RH(D): A POS
Antibody Screen: NEGATIVE
UNIT DIVISION: 0

## 2015-08-29 LAB — PREPARE PLATELET PHERESIS: Unit division: 0

## 2015-08-29 LAB — IGG, IGA, IGM
IgA: 349 mg/dL (ref 61–437)
IgG (Immunoglobin G), Serum: 878 mg/dL (ref 700–1600)
IgM, Serum: 73 mg/dL (ref 20–172)

## 2015-08-29 LAB — KAPPA/LAMBDA LIGHT CHAINS
KAPPA FREE LGHT CHN: 285.24 mg/L — AB (ref 3.30–19.40)
KAPPA, LAMDA LIGHT CHAIN RATIO: 4.19 — AB (ref 0.26–1.65)
LAMDA FREE LIGHT CHAINS: 68.09 mg/L — AB (ref 5.71–26.30)

## 2015-08-29 LAB — BETA 2 MICROGLOBULIN, SERUM: BETA 2 MICROGLOBULIN: 14.3 mg/L — AB (ref 0.6–2.4)

## 2015-08-30 LAB — IMMUNOFIXATION ELECTROPHORESIS
IGA: 336 mg/dL (ref 61–437)
IGG (IMMUNOGLOBIN G), SERUM: 885 mg/dL (ref 700–1600)
IGM, SERUM: 74 mg/dL (ref 20–172)
Total Protein ELP: 6 g/dL (ref 6.0–8.5)

## 2015-09-02 ENCOUNTER — Other Ambulatory Visit (HOSPITAL_COMMUNITY): Payer: Medicare Other

## 2015-09-03 NOTE — Progress Notes (Addendum)
No PCP Per Patient No address on file  Multiple myeloma not having achieved remission ([name]) - Plan: torsemide (DEMADEX) 20 MG tablet, amLODipine (NORVASC) 5 MG tablet, SENSIPAR 30 MG tablet, CT Biopsy, CBC with Differential, Practitioner attestation of consent, Complete patient signature process for consent form, Care order/instruction, 0.9 %  sodium chloride infusion, sodium chloride flush (NS) 0.9 % injection 10 mL, heparin lock flush 100 unit/mL, heparin lock flush 100 unit/mL, sodium chloride flush (NS) 0.9 % injection 3 mL, Prepare Pheresed Platelets, Transfuse Pheresed Platelets, acetaminophen (TYLENOL) tablet 650 mg, diphenhydrAMINE (BENADRYL) capsule 25 mg, Practitioner attestation of consent, Complete patient signature process for consent form, Care order/instruction, Prepare Pheresed Platelets, DG Bone Density  CURRENT THERAPY: REVLIMID ON HOLD, previously on Revlimid 5 mg every other day (patient refusing an increase).  He underwent bone marrow bx on 07/17/2015.    INTERVAL HISTORY: [name] 70 y.o. male returns for followup of multiple myeloma, not active at this time, S/P 2 autologous BM transplant at [facility] under the guidance of Dr. Marcell Anger (retired) with the second bone marrow transplant occuring in July 2013. Recent bone marrow demonstrates 5% plasma cells and he was seen by Dr. Norma Fredrickson who recommended starting Revlimid at a dose of 5 mg 21/28 days. Unfortunately, due to patient's complaints and Revlimid-induced rash, patient was taking Revlimid 5 mg every other day.  HOWEVER, recent increase is platelet transfusion needs was concerning.  As a result, this lead to repeat bone marrow aspiration and biopsy on 07/17/2015.    Multiple myeloma ([name])   01/26/2011 Initial Diagnosis Multiple myeloma   09/10/2014 - 09/14/2014 Chemotherapy Revlimid 5 mg days 1-21 every 28 days   09/14/2014 Adverse Reaction Rash, Revlimid versus Doxycycline   09/21/2014 - 09/23/2014 Chemotherapy Revlimid  5 mg days 1-21 every 28 days- rechallenge.   09/24/2014 Adverse Reaction Rash.  Revlimid-induced   10/22/2014 - 07/24/2015 Chemotherapy Revlimid 5 mg every other day.   07/14/2015 - 07/16/2015 Hospital Admission Nausea with vomiting secondary to viral gastroenteritis.   07/17/2015 Bone Marrow Biopsy By IR   07/17/2015 Pathology Results VARIABLY CELLULAR BONE MARROW WITH RELATIVE ABUNDANCE OF PLASMA CELLS. - GRANULOCYTIC AND MEGAKARYOCYTIC HYPOPLASIA   07/17/2015 Pathology Results The bone marrow is variably cellular but many aspirate particles are hypocellular with relative abundance of plasma cells representing 18% of all cells in the aspirate associated with interstitial cells and small clusters in the clot and biopsy sections   07/17/2015 Pathology Results Immunohistochemical stains show polyclonal staining pattern for kappa and lambda light chains in plasma cells. The latter findings are considered nonspecific and not diagnostic of plasma cell neoplasm/dyscrasia.   07/17/2015 Pathology Results The myeloid findings are nonspecific and may be regenerative as a result of chemotherapy/medication, infection, alcohol, renal disease etc.   07/17/2015 Pathology Results Normal cytogenetics.   07/24/2015 Treatment Plan Change Drug Holiday x 1 month; [facility]on [name]   08/26/2015 Miscellaneous Patient seen by Dr. Norma Fredrickson [facility] for second opinion regarding multiple myeloma and current transfusion needs.    I personally reviewed and went over laboratory results with the patient.  The results are noted within this dictation.  He continues with hemodialysis and has complications of blood loss associated with that.  Nephrologist has confirmed that he wishes we maintain an appropriate HGB with PRBC transfusion.  Of late, he's needed more transfusions. These are outlined  below: PLTS: 08/28/2015 08/19/2015 08/12/2015 08/05/2015 07/29/2015 07/24/2015 07/15/2015 07/08/2015 06/21/2015 06/05/2015 12/23/201 04/26/2015  RBC: 08/28/2015 08/12/2015 08/05/2015 07/30/2015 07/10/2015 06/21/2015 06/05/2015 05/03/2015 04/10/2015  Patient's chart in [name] is reviewed and Dr. Rossie Muskrat note is reviewed in detail.  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   PLAN:  Kappa light chain multiple myeloma. s/p autologous stem cell transplant currently off treatment with minimal abnormal light chain ratio that has remained stable. Despite of having ESRD and on HD, a serum free light chain ratio above 3 is considered abnormal and therefore can't be considered to be in remission although he is behaving as if he is with no evidence of marrow involvement. Would suggest treating him as a remission but holding off any maintenance therapy due to cytopenias.    Pancytopenia: his counts continue to deteriorate and despite of his recent bone marrow biopsy showing no definite evidence of myelodysplastic syndrome, he is behaving like one clinically. I recommend he receive platelet transfusion while receiving HD and repeat a bone marrow biopsy next month if he remains pancytopenic. His copper, iron, folate, and B12 levels are normal and his zinc level is borderline low. Not enough to explain cytopenias.    Chronic kidney failure: on HD and receiving Procrit and managed by renal. 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    Neuropathy: borderline controlled with Lyrica 75 mg. He is requesting alternative therapy. May consider Cymbalta instead.   Health maintenance: He has not seen a dentist of eye doctor in over a year. He was encouraged to do some cardiovascular exercise to prevent further deconditioning as his strength will not improve  with his sedentary lifestyle. Recommend performing bone density scan to evaluate for osteoporosis as a complication from his myeloma therapy.   He will return to his local oncologist in [place] and will return to see [place] in a month to perform biopsy  I have reviewed his CareEverywhere chart extensively.  Unfortunately, [facility] still thinks that Dr. Tressie Stalker is the patient's local medical oncologist.  Therefore, they have been sending him patient updates.  I hope to have this corrected today.  He reports that he saw podiatry recently for toenail clippings. He notes that he was not referred by primary care physician. He has been discharged from his primary care physician's office. He was to hold at podiatry that he would not have a co-pay and at his return appointment, he was told that he had $160 payment because he did not have a referral. [name] asks if I can refer him and I will try, but he is advised that insurance typically will only accept primary care physician referrals.   Past Medical History  Diagnosis Date  . Hypertension   . Ruptured cervical disc   . Fall resulting in striking against other object     paralyzed  . Multiple myeloma   . Recurrent multiple myeloma of bone marrow with unknown EBV status ([place])   . Multiple myeloma ([place]) 01/26/2011  . Unspecified vitamin D deficiency 03/06/2013  . Anemia of chronic disease 12/30/2013  . Constipation   . Dialysis patient ([place]) 2016  . Thrombocytopenia ([place]) 08/31/2014  . 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 Constipation     has Essential hypertension, benign; BRADYCARDIA; Multiple myeloma ([place]); Intravenous pamidronate causing adverse effect in therapeutic use; Unspecified vitamin D deficiency; Lymphedema  of lower extremity; Anemia of chronic disease; Chronic renal disease, stage 5, glomerular filtration rate less than or equal  to 15 mL/min/1.73 square meter ([place]); Pathologic fracture; Anemia in chronic kidney disease; Thrombocytopenia ([place]); Bleeding; Anemia; Weakness generalized; ESRD on dialysis ([place]); Anemia, chronic renal failure; Nausea vomiting and diarrhea; Pancytopenia ([place]); Elevated troponin; Chronic CHF (congestive heart failure) ([place]); Abnormal EKG; ESRD on hemodialysis (HCC); and Pleural effusion on right on his problem list.     is allergic to pamidronate.  Current Outpatient Prescriptions on File Prior to Visit  Medication Sig Dispense Refill  . calcium acetate (PHOSLO) 667 MG capsule Take 667-2,001 mg by mouth 3 (three) times daily with meals. Three capsules three times daily with meals.  1 capsule with snacks.    . furosemide (LASIX) 20 MG tablet Take 20 mg by mouth daily.    Marland Kitchen HYDROcodone-acetaminophen (NORCO/VICODIN) 5-325 MG tablet Take 1 tablet by mouth every 6 (six) hours as needed for severe pain. 60 tablet 0  . lisinopril (PRINIVIL,ZESTRIL) 20 MG tablet Take 20 mg by mouth every morning.     . Omega-3 Fatty Acids (FISH OIL) 1000 MG CAPS Take 2 capsules by mouth daily.    . pregabalin (LYRICA) 75 MG capsule Take 75 mg by mouth daily.    . ondansetron (ZOFRAN) 8 MG tablet Take 1 tablet (8 mg total) by mouth every 8 (eight) hours as needed for nausea or vomiting. (Patient not taking: Reported on 09/04/2015) 30 tablet 1  . REVLIMID 5 MG capsule Reported on 09/04/2015  0   No current facility-administered medications on file prior to visit.    Past Surgical History  Procedure Laterality Date  . Inguinal hernia repair Bilateral   . Anterior cervical decomp/discectomy fusion    . Bone marrow aspirate and biopsy wiith lumbar puncture    . Melanoma excision      rt. breast  . Cervical spine surgery    . Kyphoplasty Bilateral 01/30/2014    Procedure: Thoracic eleven Kyphoplasty;  Surgeon: Consuella Lose, MD;  Location: [facility];  Service: Neurosurgery;  Laterality: Bilateral;  Thoracic eleven  Kyphoplasty  . Av fistula placement Right 04/30/2014    Procedure: BRACHIOCEPHALIC ARTERIOVENOUS (AV) FISTULA CREATION;  Surgeon: Conrad Tyrone, MD;  Location: [facility];  Service: Vascular;  Laterality: Right;  . Portacath placement Left 02/18/2015    Procedure: INSERTION OF PORT A CATH RIGHT INTERNAL JUGULAR VEIN ;  Surgeon: Conrad Riverside, MD;  Location: [facility];  Service: Vascular;  Laterality: Left;  . Cataract extraction w/phaco Right 04/15/2015    Procedure: CATARACT EXTRACTION PHACO AND INTRAOCULAR LENS PLACEMENT (IOC);  Surgeon: Tonny Branch, MD;  Location: [facility];  Service: Ophthalmology;  Laterality: Right;  CDE:8.97  . Cataract extraction w/phaco Left 04/29/2015    Procedure: CATARACT EXTRACTION PHACO AND INTRAOCULAR LENS PLACEMENT ; CDE:  7.61;  Surgeon: Tonny Branch, MD;  Location: [facility];  Service: Ophthalmology;  Laterality: Left;    Denies any headaches, dizziness, double vision, fevers, chills, night sweats, nausea, vomiting, diarrhea, constipation, chest pain, heart palpitations, shortness of breath, blood in stool, black tarry stool, urinary pain, urinary burning, urinary frequency, hematuria.   PHYSICAL EXAMINATION  ECOG PERFORMANCE STATUS: 1 - Symptomatic but completely ambulatory  Filed Vitals:   09/04/15 1000  BP: 137/84  Pulse: 76  Temp: 98.2 F (36.8 C)  Resp: 18    GENERAL:alert, no distress, well nourished, well developed, comfortable, cooperative, obese and smiling, unaccompanied. SKIN: skin color, texture, turgor are  normal, no rashes or significant lesions HEAD: Normocephalic, No masses, lesions, tenderness or abnormalities EYES: normal, PERRLA, EOMI, Conjunctiva are pink and non-injected EARS: External ears normal OROPHARYNX:lips, buccal mucosa, and tongue normal and mucous membranes are moist  NECK: supple, trachea midline LYMPH:  not examined BREAST:not examined CARDIAC: RRR without murmur, rub, or gallop. LUNGS: CTA B/L without wheezing, rales, or  rhonchi. ABDOMEN: + BS x 4 quadrants.  Soft, nontender. BACK: Back symmetric, no curvature. EXTREMITIES:less then 2 second capillary refill, no joint deformities, effusion, or inflammation, no skin discoloration. NEURO: alert & oriented x 3 with fluent speech, no focal motor/sensory deficits, gait normal   LABORATORY DATA: CBC    Component Value Date/Time   WBC 3.9* 09/04/2015 1145   RBC 2.35* 09/04/2015 1145   HGB 8.5* 09/04/2015 1145   HCT 26.2* 09/04/2015 1145   PLT 13* 09/04/2015 1145   MCV 111.5* 09/04/2015 1145   MCH 36.2* 09/04/2015 1145   MCHC 32.4 09/04/2015 1145   RDW 26.1* 09/04/2015 1145   LYMPHSABS 1.0 09/04/2015 1145   MONOABS 0.4 09/04/2015 1145   EOSABS 0.0 09/04/2015 1145   BASOSABS 0.0 09/04/2015 1145      Chemistry      Component Value Date/Time   NA 138 08/28/2015 0905   K 4.3 08/28/2015 0905   CL 101 08/28/2015 0905   CO2 29 08/28/2015 0905   BUN 35* 08/28/2015 0905   CREATININE 4.97* 08/28/2015 0905   CREATININE 43.09* 01/26/2012 1052      Component Value Date/Time   CALCIUM 8.2* 08/28/2015 0905   ALKPHOS 45 08/28/2015 0905   AST 19 08/28/2015 0905   ALT 16* 08/28/2015 0905   BILITOT 0.8 08/28/2015 0905     Lab Results  Component Value Date   PROT 6.6 08/28/2015   ALBUMINELP 3.5 08/28/2015   A1GS 0.2 08/28/2015   A2GS 0.5 08/28/2015   BETS 0.9 08/28/2015   BETA2SER 4.1 05/16/2014   GAMS 1.1 08/28/2015   MSPIKE Not Observed 08/28/2015   SPEI Comment 08/28/2015   SPECOM Comment 08/28/2015   IGGSERUM 878 08/28/2015   IGGSERUM 885 08/28/2015   IGA 349 08/28/2015   IGA 336 08/28/2015   IGMSERUM 73 08/28/2015   IGMSERUM 74 08/28/2015   IMMELINT (NOTE) 05/16/2014   KPAFRELGTCHN 285.24* 08/28/2015   LAMBDASER 68.09* 08/28/2015   KAPLAMBRATIO 4.19* 08/28/2015    PENDING LABS:   RADIOGRAPHIC STUDIES:  No results found.   PATHOLOGY:      ASSESSMENT AND PLAN:  Multiple myeloma (HCC) Multiple myeloma, not currently active,  S/P 2 autologous BM transplant at [facility] under the guidance of Dr. Marcell Anger (retired) with the second bone marrow transplant occuring in July 2013. Recent bone marrow demonstrates 5% plasma cells and he was seen by Dr. Norma Fredrickson who recommended starting Revlimid at a dose of 5 mg 21/28 days. Unfortunately, due to patient's complaints and Revlimid-induced rash, patient was taking Revlimid 5 mg every other day.  HOWEVER, recent increase is platelet transfusion needs was concerning.  As a result, this lead to repeat bone marrow aspiration and biopsy on 07/17/2015.  Oncology history updated.  He has seen Dr. 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   PLAN:  Kappa light chain multiple myeloma. s/p autologous stem cell transplant currently off treatment with minimal abnormal light chain ratio that has remained stable. Despite of having ESRD and on  HD, a serum free light chain ratio above 3 is considered abnormal and therefore can't be considered to be in remission although he is behaving as if he is with no evidence of marrow involvement. Would suggest treating him as a remission but holding off any maintenance therapy due to cytopenias.    Pancytopenia: his counts continue to deteriorate and despite of his recent bone marrow biopsy showing no definite evidence of myelodysplastic syndrome, he is behaving like one clinically. I recommend he receive platelet transfusion while receiving HD and repeat a bone marrow biopsy next month if he remains pancytopenic. His copper, iron, folate, and B12 levels are normal and his zinc level is borderline low. Not enough to explain cytopenias.    Chronic kidney failure: on HD and receiving Procrit and managed by renal. 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    Neuropathy: borderline controlled with Lyrica 75  mg. He is requesting alternative therapy. May consider Cymbalta instead.   Health maintenance: He has not seen a dentist of eye doctor in over a year. He was encouraged to do some cardiovascular exercise to prevent further deconditioning as his strength will not improve with his sedentary lifestyle. Recommend performing bone density scan to evaluate for osteoporosis as a complication from his myeloma therapy.   He will return to his local oncologist in [place] and will return to see [place] in a month to perform biopsy   PLTS: 08/28/2015 08/19/2015 08/12/2015 08/05/2015 07/29/2015 07/24/2015 07/15/2015 07/08/2015 06/21/2015 06/05/2015 12/23/201 04/26/2015  RBC: 08/28/2015 08/12/2015 08/05/2015 07/30/2015 07/10/2015 06/21/2015 06/05/2015 05/03/2015 04/10/2015  For some reason, [facility] thinks Dr. Tressie Stalker is still the patient's primary medical oncologist.  We will advise them that he is not and all communication should be sent to [facility].  I was able to discuss this with Willa Frater, NP.  I provided her our information, including the patient's primary local medical oncology, Dr. Ancil Linsey.  I have provided her with an accurate fax # as well.  Additionally, I have provided Ms. Zenaida Niece, NP assistance with scheduling of IR bone marrow aspiration and biopsy at [facility].  She was thankful for this help.  Based upon [facility] recommendations we will do the following:  Bone density exam  Will assist [facility] in scheduling IR bone marrow aspiration and biopsy at [facility].  He will continue with CBC's every 1 week.  He has follow-up with [facility] on 09/25/2015.  Therefore, I will get his bone marrow aspiration and biopsy set up for end of April, very beginning of May.  This will need to be coordinated with his hemodialysis dates (Tues, Thurs, Sat).  We have referred the patient to podiatry, but insurance will not accept our referral as we are not primary care providers.  He has  been referred to the free clinic.  He will return in ~ 6 weeks for follow-up with scheduled appointment at [facility] following bone marrow aspiration and biopsy.  ADDENDUM: Platelet count today is 13,000. Patient is scheduled for a platelet transfusion on Friday in order to not interrupt hemodialysis schedule.   THERAPY PLAN:  Revlimid on HOLD  All questions were answered. The patient knows to call the clinic with any problems, questions or concerns. We can certainly see the patient much sooner if necessary.  Patient and plan discussed with Dr. Ancil Linsey and she is in agreement with the aforementioned.   This note is electronically signed by: Doy Mince 09/04/2015 5:30 PM

## 2015-09-03 NOTE — Assessment & Plan Note (Addendum)
Multiple myeloma, not currently active, S/P 2 autologous BM transplant at [facility] under the guidance of Dr. Marcell Anger (retired) with the second bone marrow transplant occuring in July 2013. Recent bone marrow demonstrates 5% plasma cells and he was seen by Dr. Norma Fredrickson who recommended starting Revlimid at a dose of 5 mg 21/28 days. Unfortunately, due to patient's complaints and Revlimid-induced rash, patient was taking Revlimid 5 mg every other day.  HOWEVER, recent increase is platelet transfusion needs was concerning.  As a result, this lead to repeat bone marrow aspiration and biopsy on 07/17/2015.  Oncology history updated.  He has seen Dr. 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   PLAN:  Kappa light chain multiple myeloma. s/p autologous stem cell transplant currently off treatment with minimal abnormal light chain ratio that has remained stable. Despite of having ESRD and on HD, a serum free light chain ratio above 3 is considered abnormal and therefore can't be considered to be in remission although he is behaving as if he is with no evidence of marrow involvement. Would suggest treating him as a remission but holding off any maintenance therapy due to cytopenias.    Pancytopenia: his counts continue to deteriorate and despite of his recent bone marrow biopsy showing no definite evidence of myelodysplastic syndrome, he is behaving like one clinically. I recommend he receive platelet transfusion while receiving HD and repeat a bone marrow biopsy next month if he remains pancytopenic. His copper, iron, folate, and B12 levels are normal and his zinc level is borderline low. Not enough to explain cytopenias.    Chronic kidney failure: on HD and receiving Procrit and managed by renal. 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     Neuropathy: borderline controlled with Lyrica 75 mg. He is requesting alternative therapy. May consider Cymbalta instead.   Health maintenance: He has not seen a dentist of eye doctor in over a year. He was encouraged to do some cardiovascular exercise to prevent further deconditioning as his strength will not improve with his sedentary lifestyle. Recommend performing bone density scan to evaluate for osteoporosis as a complication from his myeloma therapy.   He will return to his local oncologist in [place] and will return to see [place] in a month to perform biopsy   PLTS: 08/28/2015 08/19/2015 08/12/2015 08/05/2015 07/29/2015 07/24/2015 07/15/2015 07/08/2015 06/21/2015 06/05/2015 12/23/201 04/26/2015  RBC: 08/28/2015 08/12/2015 08/05/2015 07/30/2015 07/10/2015 06/21/2015 06/05/2015 05/03/2015 04/10/2015  For some reason, [facility] thinks Dr. Tressie Stalker is still the patient's primary medical oncologist.  We will advise them that he is not and all communication should be sent to [facility].  I was able to discuss this with Willa Frater, NP.  I provided her our information, including the patient's primary local medical oncology, Dr. Ancil Linsey.  I have provided her with an accurate fax # as well.  Additionally, I have provided Ms. Zenaida Niece, NP assistance with scheduling of IR bone marrow aspiration and biopsy at [facility].  She was thankful for this help.  Based upon [facility] recommendations we will do the following:  Bone density exam  Will assist [facility] in scheduling IR bone marrow aspiration and biopsy at [facility].  He will continue with CBC's every 1 week.  He has follow-up with [facility] on 09/25/2015.  Therefore, I will get his bone marrow aspiration and biopsy  set up for end of April, very beginning of May.  This will need to be coordinated with his hemodialysis dates (Tues, Thurs, Sat).  We have referred the patient to podiatry, but insurance will not accept our  referral as we are not primary care providers.  He has been referred to the free clinic.  He will return in ~ 6 weeks for follow-up with scheduled appointment at [facility] following bone marrow aspiration and biopsy.  ADDENDUM: Platelet count today is 13,000. Patient is scheduled for a platelet transfusion on Friday in order to not interrupt hemodialysis schedule.

## 2015-09-04 ENCOUNTER — Telehealth (HOSPITAL_COMMUNITY): Payer: Self-pay | Admitting: *Deleted

## 2015-09-04 ENCOUNTER — Encounter (HOSPITAL_BASED_OUTPATIENT_CLINIC_OR_DEPARTMENT_OTHER): Payer: Medicare Other | Admitting: Oncology

## 2015-09-04 ENCOUNTER — Encounter (HOSPITAL_COMMUNITY): Payer: Medicare Other

## 2015-09-04 VITALS — BP 137/84 | HR 76 | Temp 98.2°F | Resp 18 | Wt 242.2 lb

## 2015-09-04 DIAGNOSIS — N189 Chronic kidney disease, unspecified: Secondary | ICD-10-CM

## 2015-09-04 DIAGNOSIS — C9000 Multiple myeloma not having achieved remission: Secondary | ICD-10-CM | POA: Diagnosis not present

## 2015-09-04 DIAGNOSIS — D61818 Other pancytopenia: Secondary | ICD-10-CM

## 2015-09-04 DIAGNOSIS — G629 Polyneuropathy, unspecified: Secondary | ICD-10-CM

## 2015-09-04 DIAGNOSIS — Z992 Dependence on renal dialysis: Secondary | ICD-10-CM

## 2015-09-04 DIAGNOSIS — Z9484 Stem cells transplant status: Secondary | ICD-10-CM

## 2015-09-04 LAB — CBC WITH DIFFERENTIAL/PLATELET
BASOS PCT: 0 %
Basophils Absolute: 0 10*3/uL (ref 0.0–0.1)
EOS ABS: 0 10*3/uL (ref 0.0–0.7)
Eosinophils Relative: 1 %
HCT: 26.2 % — ABNORMAL LOW (ref 39.0–52.0)
Hemoglobin: 8.5 g/dL — ABNORMAL LOW (ref 13.0–17.0)
Lymphocytes Relative: 25 %
Lymphs Abs: 1 10*3/uL (ref 0.7–4.0)
MCH: 36.2 pg — ABNORMAL HIGH (ref 26.0–34.0)
MCHC: 32.4 g/dL (ref 30.0–36.0)
MCV: 111.5 fL — ABNORMAL HIGH (ref 78.0–100.0)
MONO ABS: 0.4 10*3/uL (ref 0.1–1.0)
MONOS PCT: 11 %
Neutro Abs: 2.4 10*3/uL (ref 1.7–7.7)
Neutrophils Relative %: 63 %
PLATELETS: 13 10*3/uL — AB (ref 150–400)
RBC: 2.35 MIL/uL — ABNORMAL LOW (ref 4.22–5.81)
RDW: 26.1 % — AB (ref 11.5–15.5)
WBC: 3.9 10*3/uL — ABNORMAL LOW (ref 4.0–10.5)

## 2015-09-04 MED ORDER — HEPARIN SOD (PORK) LOCK FLUSH 100 UNIT/ML IV SOLN
INTRAVENOUS | Status: AC
  Filled 2015-09-04: qty 5

## 2015-09-04 NOTE — Progress Notes (Signed)
CRITICAL VALUE ALERT Critical value received:  Platelets 13,000 Date of notification:  09/04/15 Time of notification [id number] Critical value read back:  Yes.   Nurse who received alert:  T.Harel Repetto,RN MD notified (1st page):  [phone]

## 2015-09-04 NOTE — Telephone Encounter (Signed)
Spoke with [name] and scheduled platelet transfusion for Friday at 1:45. Spoke with Sherri in lab and notified them of need for platelets for Friday.

## 2015-09-04 NOTE — Progress Notes (Signed)
[name] presented for Portacath access and flush.  Portacath located rt chest wall accessed with  H 20 needle.  Good blood return present. Portacath flushed with 8ml NS and 500U/45ml Heparin and needle removed intact.  Procedure tolerated well and without incident.

## 2015-09-04 NOTE — Patient Instructions (Signed)
[place] at [facility] Discharge Instructions  RECOMMENDATIONS MADE BY THE CONSULTANT AND ANY TEST RESULTS WILL BE SENT TO YOUR REFERRING PHYSICIAN.  Labs today If transfusion we will do Friday Continue weekly labs HOLD Revlimid Bone marrow biopsy end of April Return in 6 weeks for f/u Referral to Podiatry-    Thank you for choosing [place] at [facility] to provide your oncology and hematology care.  To afford each patient quality time with our provider, please arrive at least 15 minutes before your scheduled appointment time.   Beginning January 23rd 2017 lab work for the [facility] will be done in the  Main lab at [facility] on 1st floor. If you have a lab appointment with the [place] please come in thru the  Main Entrance and check in at the main information desk  You need to re-schedule your appointment should you arrive 10 or more minutes late.  We strive to give you quality time with our providers, and arriving late affects you and other patients whose appointments are after yours.  Also, if you no show three or more times for appointments you may be dismissed from the clinic at the providers discretion.     Again, thank you for choosing [facility].  Our hope is that these requests will decrease the amount of time that you wait before being seen by our physicians.       _____________________________________________________________  Should you have questions after your visit to [facility], please contact our office at [phone] between the hours of 8:30 a.m. and 4:30 p.m.  Voicemails left after 4:30 p.m. will not be returned until the following business day.  For prescription refill requests, have your pharmacy contact our office.         Resources For Cancer Patients and their Caregivers ? American Cancer Society: Can assist with transportation, wigs, general needs, runs Look Good  Feel Better.        303-171-5300 ? Cancer Care: Provides financial assistance, online support groups, medication/co-pay assistance.  1-800-813-HOPE 469-754-8216) ? [place] Assists [place] cancer patients and their families through emotional , educational and financial support.  ([phone] ? [place] DSS Where to apply for food stamps, Medicaid and utility assistance. 902-148-0738 ? RCATS: Transportation to medical appointments. [phone] ? Social Security Administration: May apply for disability if have a Stage IV cancer. [phone] 516-423-7815 ? [employer], Disability and Transit Services: Assists with nutrition, care and transit needs. [phone]

## 2015-09-04 NOTE — Addendum Note (Signed)
Addended by: Baird Cancer on: 09/04/2015 05:31 PM   Modules accepted: Level of Service

## 2015-09-04 NOTE — Progress Notes (Signed)
See exam encounter 

## 2015-09-06 ENCOUNTER — Encounter (HOSPITAL_COMMUNITY): Payer: Self-pay

## 2015-09-06 ENCOUNTER — Encounter (HOSPITAL_BASED_OUTPATIENT_CLINIC_OR_DEPARTMENT_OTHER): Payer: Medicare Other

## 2015-09-06 VITALS — BP 125/71 | HR 75 | Temp 97.8°F | Resp 18

## 2015-09-06 DIAGNOSIS — C9000 Multiple myeloma not having achieved remission: Secondary | ICD-10-CM | POA: Diagnosis not present

## 2015-09-06 MED ORDER — ACETAMINOPHEN 325 MG PO TABS
ORAL_TABLET | ORAL | Status: AC
  Filled 2015-09-06: qty 2

## 2015-09-06 MED ORDER — SODIUM CHLORIDE 0.9% FLUSH: 10.0000 mL | INTRAVENOUS | Status: DC | PRN

## 2015-09-06 MED ORDER — ACETAMINOPHEN 325 MG PO TABS
650.0000 mg | ORAL_TABLET | Freq: Once | ORAL | Status: AC
  Administered 2015-09-06: 650 mg via ORAL

## 2015-09-06 MED ORDER — DIPHENHYDRAMINE HCL 25 MG PO CAPS
ORAL_CAPSULE | ORAL | Status: AC
  Filled 2015-09-06: qty 1

## 2015-09-06 MED ORDER — DIPHENHYDRAMINE HCL 25 MG PO CAPS
25.0000 mg | ORAL_CAPSULE | Freq: Once | ORAL | Status: AC
  Administered 2015-09-06: 25 mg via ORAL

## 2015-09-06 MED ORDER — SODIUM CHLORIDE 0.9 % IV SOLN
250.0000 mL | Freq: Once | INTRAVENOUS | Status: AC
  Administered 2015-09-06: 250 mL via INTRAVENOUS

## 2015-09-06 MED ORDER — HEPARIN SOD (PORK) LOCK FLUSH 100 UNIT/ML IV SOLN
500.0000 [IU] | Freq: Every day | INTRAVENOUS | Status: AC | PRN
  Administered 2015-09-06: 500 [IU]
  Filled 2015-09-06: qty 5

## 2015-09-06 NOTE — Progress Notes (Signed)
Patient received one unit of platelets per orders today. Patient tolerated well. Vitals stable.

## 2015-09-06 NOTE — Patient Instructions (Signed)
[place] at [facility] Discharge Instructions  RECOMMENDATIONS MADE BY THE CONSULTANT AND ANY TEST RESULTS WILL BE SENT TO YOUR REFERRING PHYSICIAN.  Received one unit of platelets today. Return as scheduled. Call for any concerns or questions  Thank you for choosing [place] at [facility] to provide your oncology and hematology care.  To afford each patient quality time with our provider, please arrive at least 15 minutes before your scheduled appointment time.   Beginning January 23rd 2017 lab work for the [facility] will be done in the  Main lab at [facility] on 1st floor. If you have a lab appointment with the [place] please come in thru the  Main Entrance and check in at the main information desk  You need to re-schedule your appointment should you arrive 10 or more minutes late.  We strive to give you quality time with our providers, and arriving late affects you and other patients whose appointments are after yours.  Also, if you no show three or more times for appointments you may be dismissed from the clinic at the providers discretion.     Again, thank you for choosing [facility].  Our hope is that these requests will decrease the amount of time that you wait before being seen by our physicians.       _____________________________________________________________  Should you have questions after your visit to [facility], please contact our office at [phone] between the hours of 8:30 a.m. and 4:30 p.m.  Voicemails left after 4:30 p.m. will not be returned until the following business day.  For prescription refill requests, have your pharmacy contact our office.         Resources For Cancer Patients and their Caregivers ? American Cancer Society: Can assist with transportation, wigs, general needs, runs Look Good Feel Better.        9340999356 ? Cancer Care: Provides financial  assistance, online support groups, medication/co-pay assistance.  1-800-813-HOPE 343-140-8010) ? [place] Assists [place] cancer patients and their families through emotional , educational and financial support.  7165575065 ? [place] DSS Where to apply for food stamps, Medicaid and utility assistance. (616) 464-9930 ? RCATS: Transportation to medical appointments. 214-099-8641 ? Social Security Administration: May apply for disability if have a Stage IV cancer. 3145980412 [phone] ? [employer], Disability and Transit Services: Assists with nutrition, care and transit needs. [phone]

## 2015-09-07 LAB — PREPARE PLATELET PHERESIS
UNIT DIVISION: 0
Unit division: 0

## 2015-09-09 ENCOUNTER — Other Ambulatory Visit (HOSPITAL_COMMUNITY): Payer: Medicare Other

## 2015-09-09 ENCOUNTER — Ambulatory Visit (HOSPITAL_COMMUNITY)
Admission: RE | Admit: 2015-09-09 | Discharge: 2015-09-09 | Disposition: A | Discharge status: Home / Self Care | Payer: Medicare Other | Source: Ambulatory Visit | Attending: Oncology | Admitting: Oncology

## 2015-09-09 DIAGNOSIS — C9000 Multiple myeloma not having achieved remission: Secondary | ICD-10-CM | POA: Insufficient documentation

## 2015-09-11 ENCOUNTER — Telehealth (HOSPITAL_COMMUNITY): Payer: Self-pay | Admitting: *Deleted

## 2015-09-11 ENCOUNTER — Encounter (HOSPITAL_BASED_OUTPATIENT_CLINIC_OR_DEPARTMENT_OTHER): Payer: Medicare Other

## 2015-09-11 ENCOUNTER — Encounter (HOSPITAL_COMMUNITY): Payer: Medicare Other

## 2015-09-11 ENCOUNTER — Other Ambulatory Visit (HOSPITAL_COMMUNITY): Payer: Self-pay | Admitting: Oncology

## 2015-09-11 VITALS — BP 127/76 | HR 68 | Temp 98.0°F | Resp 18

## 2015-09-11 DIAGNOSIS — D696 Thrombocytopenia, unspecified: Secondary | ICD-10-CM

## 2015-09-11 DIAGNOSIS — C9000 Multiple myeloma not having achieved remission: Secondary | ICD-10-CM

## 2015-09-11 DIAGNOSIS — D649 Anemia, unspecified: Secondary | ICD-10-CM

## 2015-09-11 LAB — SAMPLE TO BLOOD BANK

## 2015-09-11 LAB — CBC
HCT: 25.9 % — ABNORMAL LOW (ref 39.0–52.0)
Hemoglobin: 8.7 g/dL — ABNORMAL LOW (ref 13.0–17.0)
MCH: 37.3 pg — ABNORMAL HIGH (ref 26.0–34.0)
MCHC: 33.6 g/dL (ref 30.0–36.0)
MCV: 111.2 fL — ABNORMAL HIGH (ref 78.0–100.0)
PLATELETS: 17 10*3/uL — AB (ref 150–400)
RBC: 2.33 MIL/uL — ABNORMAL LOW (ref 4.22–5.81)
WBC: 3.4 10*3/uL — AB (ref 4.0–10.5)

## 2015-09-11 MED ORDER — SODIUM CHLORIDE 0.9 % IV SOLN
250.0000 mL | Freq: Once | INTRAVENOUS | Status: AC
  Administered 2015-09-11: 250 mL via INTRAVENOUS

## 2015-09-11 MED ORDER — DIPHENHYDRAMINE HCL 25 MG PO CAPS
25.0000 mg | ORAL_CAPSULE | Freq: Once | ORAL | Status: AC
  Administered 2015-09-11: 25 mg via ORAL

## 2015-09-11 MED ORDER — SODIUM CHLORIDE 0.9% FLUSH
10.0000 mL | INTRAVENOUS | Status: AC | PRN
  Administered 2015-09-11: 10 mL

## 2015-09-11 MED ORDER — ACETAMINOPHEN 325 MG PO TABS
650.0000 mg | ORAL_TABLET | Freq: Once | ORAL | Status: AC
  Administered 2015-09-11: 650 mg via ORAL

## 2015-09-11 MED ORDER — HEPARIN SOD (PORK) LOCK FLUSH 100 UNIT/ML IV SOLN
500.0000 [IU] | Freq: Every day | INTRAVENOUS | Status: AC | PRN
  Administered 2015-09-11: 500 [IU]

## 2015-09-11 MED ORDER — DIPHENHYDRAMINE HCL 25 MG PO CAPS
ORAL_CAPSULE | ORAL | Status: AC
  Filled 2015-09-11: qty 2

## 2015-09-11 MED ORDER — HEPARIN SOD (PORK) LOCK FLUSH 100 UNIT/ML IV SOLN
INTRAVENOUS | Status: AC
  Filled 2015-09-11: qty 5

## 2015-09-11 MED ORDER — ACETAMINOPHEN 325 MG PO TABS
ORAL_TABLET | ORAL | Status: AC
  Filled 2015-09-11: qty 2

## 2015-09-11 NOTE — Patient Instructions (Signed)
[place] at [facility] Discharge Instructions  RECOMMENDATIONS MADE BY THE CONSULTANT AND ANY TEST RESULTS WILL BE SENT TO YOUR REFERRING PHYSICIAN.  One unit of platelets.    Thank you for choosing [place] at [facility] to provide your oncology and hematology care.  To afford each patient quality time with our provider, please arrive at least 15 minutes before your scheduled appointment time.   Beginning January 23rd 2017 lab work for the [facility] will be done in the  Main lab at [facility] on 1st floor. If you have a lab appointment with the [place] please come in thru the  Main Entrance and check in at the main information desk  You need to re-schedule your appointment should you arrive 10 or more minutes late.  We strive to give you quality time with our providers, and arriving late affects you and other patients whose appointments are after yours.  Also, if you no show three or more times for appointments you may be dismissed from the clinic at the providers discretion.     Again, thank you for choosing [facility].  Our hope is that these requests will decrease the amount of time that you wait before being seen by our physicians.       _____________________________________________________________  Should you have questions after your visit to [facility], please contact our office at [phone] between the hours of 8:30 a.m. and 4:30 p.m.  Voicemails left after 4:30 p.m. will not be returned until the following business day.  For prescription refill requests, have your pharmacy contact our office.         Resources For Cancer Patients and their Caregivers ? American Cancer Society: Can assist with transportation, wigs, general needs, runs Look Good Feel Better.        909-772-7318 ? Cancer Care: Provides financial assistance, online support groups, medication/co-pay assistance.   1-800-813-HOPE 3850633776) ? [place] Assists [place] cancer patients and their families through emotional , educational and financial support.  405-174-2291 ? [place] DSS Where to apply for food stamps, Medicaid and utility assistance. [phone] ? RCATS: Transportation to medical appointments. [phone] ? Social Security Administration: May apply for disability if have a Stage IV cancer. [phone] [phone] ? [employer], Disability and Transit Services: Assists with nutrition, care and transit needs. [phone]

## 2015-09-11 NOTE — Telephone Encounter (Signed)
CRITICAL VALUE ALERT Critical value received:  Platelet 17000 Date of notification:  09/11/2015 Time of notification: [id number] Critical value read back:  yes Nurse who received alert:  TAR MD notified (1st page):  Kefalas PA-C

## 2015-09-11 NOTE — Progress Notes (Signed)
Patient tolerated infusion well.  VSS.   

## 2015-09-12 LAB — PREPARE PLATELET PHERESIS: UNIT DIVISION: 0

## 2015-09-16 ENCOUNTER — Encounter (HOSPITAL_BASED_OUTPATIENT_CLINIC_OR_DEPARTMENT_OTHER): Payer: Medicare Other

## 2015-09-16 ENCOUNTER — Telehealth (HOSPITAL_COMMUNITY): Payer: Self-pay | Admitting: *Deleted

## 2015-09-16 ENCOUNTER — Other Ambulatory Visit: Payer: Self-pay | Admitting: Radiology

## 2015-09-16 ENCOUNTER — Encounter (HOSPITAL_COMMUNITY): Payer: Medicare Other | Attending: Hematology & Oncology

## 2015-09-16 VITALS — BP 141/80 | HR 75 | Temp 97.4°F | Resp 16

## 2015-09-16 DIAGNOSIS — R0902 Hypoxemia: Secondary | ICD-10-CM

## 2015-09-16 DIAGNOSIS — D696 Thrombocytopenia, unspecified: Secondary | ICD-10-CM

## 2015-09-16 DIAGNOSIS — C9000 Multiple myeloma not having achieved remission: Secondary | ICD-10-CM

## 2015-09-16 HISTORY — DX: Hypoxemia: R09.02

## 2015-09-16 LAB — CBC WITH DIFFERENTIAL/PLATELET
Basophils Absolute: 0 10*3/uL (ref 0.0–0.1)
Basophils Relative: 0 %
EOS ABS: 0 10*3/uL (ref 0.0–0.7)
EOS PCT: 1 %
HCT: 26.1 % — ABNORMAL LOW (ref 39.0–52.0)
HEMOGLOBIN: 8.7 g/dL — AB (ref 13.0–17.0)
LYMPHS ABS: 1.2 10*3/uL (ref 0.7–4.0)
Lymphocytes Relative: 34 %
MCH: 38 pg — AB (ref 26.0–34.0)
MCHC: 33.3 g/dL (ref 30.0–36.0)
MCV: 113.6 fL — AB (ref 78.0–100.0)
MONOS PCT: 9 %
Monocytes Absolute: 0.3 10*3/uL (ref 0.1–1.0)
NEUTROS PCT: 57 %
Neutro Abs: 2 10*3/uL (ref 1.7–7.7)
Platelets: 15 10*3/uL — CL (ref 150–400)
RBC: 2.29 MIL/uL — ABNORMAL LOW (ref 4.22–5.81)
WBC: 3.5 10*3/uL — ABNORMAL LOW (ref 4.0–10.5)

## 2015-09-16 MED ORDER — ACETAMINOPHEN 325 MG PO TABS
650.0000 mg | ORAL_TABLET | Freq: Once | ORAL | Status: AC
  Administered 2015-09-16: 650 mg via ORAL

## 2015-09-16 MED ORDER — DIPHENHYDRAMINE HCL 25 MG PO CAPS
ORAL_CAPSULE | ORAL | Status: AC
  Filled 2015-09-16: qty 1

## 2015-09-16 MED ORDER — HEPARIN SOD (PORK) LOCK FLUSH 100 UNIT/ML IV SOLN
INTRAVENOUS | Status: AC
  Filled 2015-09-16: qty 5

## 2015-09-16 MED ORDER — SODIUM CHLORIDE 0.9% FLUSH
10.0000 mL | INTRAVENOUS | Status: AC | PRN
  Administered 2015-09-16: 10 mL

## 2015-09-16 MED ORDER — ACETAMINOPHEN 325 MG PO TABS
ORAL_TABLET | ORAL | Status: AC
  Filled 2015-09-16: qty 2

## 2015-09-16 MED ORDER — SODIUM CHLORIDE 0.9 % IV SOLN
250.0000 mL | Freq: Once | INTRAVENOUS | Status: AC
  Administered 2015-09-16: 250 mL via INTRAVENOUS

## 2015-09-16 MED ORDER — HEPARIN SOD (PORK) LOCK FLUSH 100 UNIT/ML IV SOLN
500.0000 [IU] | Freq: Every day | INTRAVENOUS | Status: AC | PRN
  Administered 2015-09-16: 500 [IU]

## 2015-09-16 MED ORDER — DIPHENHYDRAMINE HCL 25 MG PO CAPS
25.0000 mg | ORAL_CAPSULE | Freq: Once | ORAL | Status: AC
  Administered 2015-09-16: 25 mg via ORAL

## 2015-09-16 NOTE — Progress Notes (Signed)
Tolerated platelets transfusion well. Ambulatory on discharge home to self.

## 2015-09-16 NOTE — Telephone Encounter (Signed)
CRITICAL VALUE ALERT Critical value received:  Platelets 13000 Date of notification:  09/16/2015 Time of notification: 1111 Critical value read back:  Yes.   Nurse who received alert:  TAR MD notified (1st page):  Dr. Whitney Muse - Patient is scheduled for platelets this afternoon

## 2015-09-16 NOTE — Patient Instructions (Signed)
[place] at [facility] Discharge Instructions  RECOMMENDATIONS MADE BY THE CONSULTANT AND ANY TEST RESULTS WILL BE SENT TO YOUR REFERRING PHYSICIAN.  Platelets given as ordered. Return as scheduled.  Thank you for choosing [place] at [facility] to provide your oncology and hematology care.  To afford each patient quality time with our provider, please arrive at least 15 minutes before your scheduled appointment time.   Beginning January 23rd 2017 lab work for the [facility] will be done in the  Main lab at [facility] on 1st floor. If you have a lab appointment with the [place] please come in thru the  Main Entrance and check in at the main information desk  You need to re-schedule your appointment should you arrive 10 or more minutes late.  We strive to give you quality time with our providers, and arriving late affects you and other patients whose appointments are after yours.  Also, if you no show three or more times for appointments you may be dismissed from the clinic at the providers discretion.     Again, thank you for choosing [facility].  Our hope is that these requests will decrease the amount of time that you wait before being seen by our physicians.       _____________________________________________________________  Should you have questions after your visit to [facility], please contact our office at [phone] between the hours of 8:30 a.m. and 4:30 p.m.  Voicemails left after 4:30 p.m. will not be returned until the following business day.  For prescription refill requests, have your pharmacy contact our office.         Resources For Cancer Patients and their Caregivers ? American Cancer Society: Can assist with transportation, wigs, general needs, runs Look Good Feel Better.        5482192812 ? Cancer Care: Provides financial assistance, online support groups,  medication/co-pay assistance.  1-800-813-HOPE (438)341-8583) ? [place] Assists [place] cancer patients and their families through emotional , educational and financial support.  587-097-0107 ? [place] DSS Where to apply for food stamps, Medicaid and utility assistance. [phone] ? RCATS: Transportation to medical appointments. 401-127-2762 ? Social Security Administration: May apply for disability if have a Stage IV cancer. [phone] ? [employer], Disability and Transit Services: Assists with nutrition, care and transit needs. [phone]

## 2015-09-17 ENCOUNTER — Other Ambulatory Visit: Payer: Self-pay | Admitting: General Surgery

## 2015-09-17 ENCOUNTER — Other Ambulatory Visit: Payer: Self-pay | Admitting: Radiology

## 2015-09-17 LAB — PREPARE PLATELET PHERESIS: Unit division: 0

## 2015-09-18 ENCOUNTER — Encounter (HOSPITAL_COMMUNITY): Payer: Self-pay

## 2015-09-18 ENCOUNTER — Ambulatory Visit (HOSPITAL_COMMUNITY)
Admission: RE | Admit: 2015-09-18 | Discharge: 2015-09-18 | Disposition: A | Discharge status: Home / Self Care | Payer: Medicare Other | Source: Ambulatory Visit | Attending: Oncology | Admitting: Oncology

## 2015-09-18 DIAGNOSIS — D72822 Plasmacytosis: Secondary | ICD-10-CM | POA: Diagnosis not present

## 2015-09-18 DIAGNOSIS — Z992 Dependence on renal dialysis: Secondary | ICD-10-CM | POA: Diagnosis not present

## 2015-09-18 DIAGNOSIS — I120 Hypertensive chronic kidney disease with stage 5 chronic kidney disease or end stage renal disease: Secondary | ICD-10-CM | POA: Insufficient documentation

## 2015-09-18 DIAGNOSIS — Z79899 Other long term (current) drug therapy: Secondary | ICD-10-CM | POA: Insufficient documentation

## 2015-09-18 DIAGNOSIS — N185 Chronic kidney disease, stage 5: Secondary | ICD-10-CM | POA: Insufficient documentation

## 2015-09-18 DIAGNOSIS — D61818 Other pancytopenia: Secondary | ICD-10-CM | POA: Diagnosis not present

## 2015-09-18 DIAGNOSIS — C9000 Multiple myeloma not having achieved remission: Secondary | ICD-10-CM | POA: Diagnosis present

## 2015-09-18 DIAGNOSIS — Z809 Family history of malignant neoplasm, unspecified: Secondary | ICD-10-CM | POA: Insufficient documentation

## 2015-09-18 DIAGNOSIS — Z888 Allergy status to other drugs, medicaments and biological substances status: Secondary | ICD-10-CM | POA: Insufficient documentation

## 2015-09-18 DIAGNOSIS — E559 Vitamin D deficiency, unspecified: Secondary | ICD-10-CM | POA: Insufficient documentation

## 2015-09-18 LAB — APTT: aPTT: 41 seconds — ABNORMAL HIGH (ref 24–37)

## 2015-09-18 LAB — PROTIME-INR
INR: 1.15 (ref 0.00–1.49)
PROTHROMBIN TIME: 14.4 s (ref 11.6–15.2)

## 2015-09-18 LAB — BONE MARROW EXAM

## 2015-09-18 LAB — CBC
HCT: 27.4 % — ABNORMAL LOW (ref 39.0–52.0)
Hemoglobin: 9.1 g/dL — ABNORMAL LOW (ref 13.0–17.0)
MCH: 37 pg — AB (ref 26.0–34.0)
MCHC: 33.2 g/dL (ref 30.0–36.0)
MCV: 111.4 fL — AB (ref 78.0–100.0)
Platelets: 31 10*3/uL — ABNORMAL LOW (ref 150–400)
RBC: 2.46 MIL/uL — ABNORMAL LOW (ref 4.22–5.81)
WBC: 3.3 10*3/uL — ABNORMAL LOW (ref 4.0–10.5)

## 2015-09-18 MED ORDER — HYDROCODONE-ACETAMINOPHEN 5-325 MG PO TABS
1.0000 | ORAL_TABLET | ORAL | Status: DC | PRN
  Filled 2015-09-18: qty 2

## 2015-09-18 MED ORDER — SODIUM CHLORIDE 0.9 % IV SOLN
Freq: Once | INTRAVENOUS | Status: AC
  Administered 2015-09-18: 10:00:00 via INTRAVENOUS

## 2015-09-18 MED ORDER — FENTANYL CITRATE (PF) 100 MCG/2ML IJ SOLN
INTRAMUSCULAR | Status: AC | PRN
  Administered 2015-09-18 (×2): 25 ug via INTRAVENOUS

## 2015-09-18 MED ORDER — FENTANYL CITRATE (PF) 100 MCG/2ML IJ SOLN
INTRAMUSCULAR | Status: AC
  Filled 2015-09-18: qty 2

## 2015-09-18 MED ORDER — HEPARIN SOD (PORK) LOCK FLUSH 100 UNIT/ML IV SOLN
500.0000 [IU] | Freq: Once | INTRAVENOUS | Status: AC
  Administered 2015-09-18: 500 [IU] via INTRAVENOUS
  Filled 2015-09-18 (×2): qty 5

## 2015-09-18 MED ORDER — MIDAZOLAM HCL 2 MG/2ML IJ SOLN
INTRAMUSCULAR | Status: AC
  Filled 2015-09-18: qty 4

## 2015-09-18 MED ORDER — MIDAZOLAM HCL 2 MG/2ML IJ SOLN
INTRAMUSCULAR | Status: AC | PRN
Start: 2015-09-18 — End: 2015-09-18
  Administered 2015-09-18: 1 mg via INTRAVENOUS
  Administered 2015-09-18: 0.5 mg via INTRAVENOUS

## 2015-09-18 NOTE — H&P (Signed)
Chief Complaint: multiple myeloma  Referring Physician: Dr. Ancil Linsey  Supervising Physician: Jacqulynn Cadet  Patient Status: Out-pt  HPI: [name] is an 70 y.o. male with a history of multiple myeloma.  He has had multiple bone marrow biopsies in the past.  He is currently being followed by [facility] and [name] for this.  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  A repeat BMBX has been request and the patient is here today for this procedure.  He has no new complaints.  Past Medical History:  Past Medical History  Diagnosis Date  . Hypertension   . Ruptured cervical disc   . Fall resulting in striking against other object     paralyzed  . Multiple myeloma   . 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 Multiple myeloma ([place]) 01/26/2011  . Unspecified vitamin D deficiency 03/06/2013  . Anemia of chronic disease 12/30/2013  . Constipation   . Dialysis patient ([place]) 2016  . Thrombocytopenia ([place]) 08/31/2014  . 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 Constipation     Past Surgical History:  Past Surgical History  Procedure Laterality Date  . Inguinal hernia repair Bilateral   . Anterior cervical decomp/discectomy fusion    . Bone marrow aspirate and biopsy wiith lumbar puncture    . Melanoma excision      rt. breast  . Cervical spine surgery    . Kyphoplasty Bilateral 01/30/2014    Procedure: Thoracic eleven Kyphoplasty;  Surgeon: Consuella Lose, MD;  Location: [facility];  Service: Neurosurgery;  Laterality: Bilateral;  Thoracic eleven Kyphoplasty  . Av fistula placement Right 04/30/2014    Procedure: BRACHIOCEPHALIC ARTERIOVENOUS (AV) FISTULA CREATION;  Surgeon: Conrad Humacao, MD;  Location: [place];  Service: Vascular;  Laterality: Right;  . Portacath placement Left 02/18/2015    Procedure: INSERTION OF PORT A CATH  RIGHT INTERNAL JUGULAR VEIN ;  Surgeon: Conrad Lueders, MD;  Location: [place];  Service: Vascular;  Laterality: Left;  . Cataract extraction w/phaco Right 04/15/2015    Procedure: CATARACT EXTRACTION PHACO AND INTRAOCULAR LENS PLACEMENT (IOC);  Surgeon: Tonny Branch, MD;  Location: [facility];  Service: Ophthalmology;  Laterality: Right;  CDE:8.97  . Cataract extraction w/phaco Left 04/29/2015    Procedure: CATARACT EXTRACTION PHACO AND INTRAOCULAR LENS PLACEMENT ; CDE:  7.61;  Surgeon: Tonny Branch, MD;  Location: [facility];  Service: Ophthalmology;  Laterality: Left;    Family History:  Family History  Problem Relation Age of Onset  . Cancer Sister   . Cancer Brother     Social History:  reports that he has never smoked. He has never used smokeless tobacco. He reports that he does not drink alcohol or use illicit drugs.  Allergies:  Allergies  Allergen Reactions  . Pamidronate Anaphylaxis    blood pressure     Medications:   Medication List    ASK your doctor about these medications        amLODipine 5 MG tablet  Commonly known as:  NORVASC     calcium acetate 667 MG capsule  Commonly known as:  PHOSLO  Take 667-2,001 mg by mouth 3 (three) times daily with meals. Three capsules three times daily with meals.  1 capsule with snacks.     Fish Oil 1000 MG Caps  Take 2  capsules by mouth daily.     furosemide 20 MG tablet  Commonly known as:  LASIX  Take 20 mg by mouth daily.     HYDROcodone-acetaminophen 5-325 MG tablet  Commonly known as:  NORCO/VICODIN  Take 1 tablet by mouth every 6 (six) hours as needed for severe pain.     lisinopril 20 MG tablet  Commonly known as:  PRINIVIL,ZESTRIL  Take 20 mg by mouth every morning.     ondansetron 8 MG tablet  Commonly known as:  ZOFRAN  Take 1 tablet (8 mg total) by mouth every 8 (eight) hours as needed for nausea or vomiting.     pregabalin 75 MG capsule  Commonly known as:  LYRICA  Take 75 mg by mouth daily.     REVLIMID 5 MG  capsule  Generic drug:  lenalidomide  Reported on 09/04/2015     SENSIPAR 30 MG tablet  Generic drug:  cinacalcet     torsemide 20 MG tablet  Commonly known as:  DEMADEX        Please HPI for pertinent positives, otherwise complete 10 system ROS negative.  Mallampati Score: MD Evaluation Airway: WNL Heart: WNL Abdomen: WNL Chest/ Lungs: WNL ASA  Classification: 3 Mallampati/Airway Score: Two  Physical Exam: BP 158/86 mmHg  Pulse 93  Temp(Src) 97.7 F (36.5 C) (Oral)  Resp 18  Wt 238 lb 3.2 oz (108.047 kg)  SpO2 97% Body mass index is 34.18 kg/(m^2). General: pleasant, WD, WN white male who is laying in bed in NAD HEENT: head is normocephalic, atraumatic.  Sclera are noninjected.  PERRL.  Ears and nose without any masses or lesions.  Mouth is pink and moist Heart: regular, rate, and rhythm.  Normal s1,s2. No obvious murmurs, gallops, or rubs noted.  Palpable radial and pedal pulses bilaterally Lungs: CTAB, no wheezes, rhonchi, or rales noted.  Respiratory effort nonlabored, PAC in right chest Abd: soft, NT, ND, +BS, no masses, hernias, or organomegaly Psych: A&Ox3 with an appropriate affect.   Labs: Results for orders placed or performed in visit on 09/16/15 (from the past 48 hour(s))  CBC with Differential     Status: Abnormal   Collection Time: 09/16/15 10:46 AM  Result Value Ref Range   WBC 3.5 (L) 4.0 - 10.5 K/uL   RBC 2.29 (L) 4.22 - 5.81 MIL/uL   Hemoglobin 8.7 (L) 13.0 - 17.0 g/dL   HCT 26.1 (L) 39.0 - 52.0 %   MCV 113.6 (H) 78.0 - 100.0 fL   MCH 38.0 (H) 26.0 - 34.0 pg   MCHC 33.3 30.0 - 36.0 g/dL   Platelets 15 (LL) 150 - 400 K/uL    Comment: RESULT REPEATED AND VERIFIED SPECIMEN CHECKED FOR CLOTS PLATELET COUNT CONFIRMED BY SMEAR CRITICAL RESULT CALLED TO, READ BACK BY AND VERIFIED WITH: TAMMY ROBINSON ON [id number] AT 1110 BY RESSEGGER R    Neutrophils Relative % 57 %   Neutro Abs 2.0 1.7 - 7.7 K/uL   Lymphocytes Relative 34 %   Lymphs Abs 1.2 0.7 -  4.0 K/uL   Monocytes Relative 9 %   Monocytes Absolute 0.3 0.1 - 1.0 K/uL   Eosinophils Relative 1 %   Eosinophils Absolute 0.0 0.0 - 0.7 K/uL   Basophils Relative 0 %   Basophils Absolute 0.0 0.0 - 0.1 K/uL    Imaging: No results found.  Assessment/Plan 1. Multiple myeloma -plan for repeat bone marrow biopsy today -labs are pending, vitals have been reviewed -Risks and Benefits discussed with the  patient including, but not limited to bleeding, infection, damage to adjacent structures or low yield requiring additional tests. All of the patient's questions were answered, patient is agreeable to proceed. Consent signed and in chart.   Thank you for this interesting consult.  I greatly enjoyed meeting OLLIN HOCHMUTH and look forward to participating in their care.  A copy of this report was sent to the requesting provider on this date.  Electronically Signed: Henreitta Cea 09/18/2015, 9:45 AM   I spent a total of    25 Minutes in face to face in clinical consultation, greater than 50% of which was counseling/coordinating care for BMBX for multiple myeloma

## 2015-09-18 NOTE — Sedation Documentation (Signed)
Patient denies pain and is resting comfortably.  

## 2015-09-18 NOTE — Discharge Instructions (Signed)
Moderate Conscious Sedation, Adult, Care After °Refer to this sheet in the next few weeks. These instructions provide you with information on caring for yourself after your procedure. Your health care provider may also give you more specific instructions. Your treatment has been planned according to current medical practices, but problems sometimes occur. Call your health care provider if you have any problems or questions after your procedure. °WHAT TO EXPECT AFTER THE PROCEDURE  °After your procedure: °· You may feel sleepy, clumsy, and have poor balance for several hours. °· Vomiting may occur if you eat too soon after the procedure. °HOME CARE INSTRUCTIONS °· Do not participate in any activities where you could become injured for at least 24 hours. Do not: °¨ Drive. °¨ Swim. °¨ Ride a bicycle. °¨ Operate heavy machinery. °¨ Cook. °¨ Use power tools. °¨ Climb ladders. °¨ Work from a high place. °· Do not make important decisions or sign legal documents until you are improved. °· If you vomit, drink water, juice, or soup when you can drink without vomiting. Make sure you have little or no nausea before eating solid foods. °· Only take over-the-counter or prescription medicines for pain, discomfort, or fever as directed by your health care provider. °· Make sure you and your family fully understand everything about the medicines given to you, including what side effects may occur. °· You should not drink alcohol, take sleeping pills, or take medicines that cause drowsiness for at least 24 hours. °· If you smoke, do not smoke without supervision. °· If you are feeling better, you may resume normal activities 24 hours after you were sedated. °· Keep all appointments with your health care provider. °SEEK MEDICAL CARE IF: °· Your skin is pale or bluish in color. °· You continue to feel nauseous or vomit. °· Your pain is getting worse and is not helped by medicine. °· You have bleeding or swelling. °· You are still  sleepy or feeling clumsy after 24 hours. °SEEK IMMEDIATE MEDICAL CARE IF: °· You develop a rash. °· You have difficulty breathing. °· You develop any type of allergic problem. °· You have a fever. °MAKE SURE YOU: °· Understand these instructions. °· Will watch your condition. °· Will get help right away if you are not doing well or get worse. °  °This information is not intended to replace advice given to you by your health care provider. Make sure you discuss any questions you have with your health care provider. °  °Document Released: 02/22/2013 Document Revised: 05/25/2014 Document Reviewed: 02/22/2013 °Elsevier Interactive Patient Education ©2016 Elsevier Inc. °Bone Marrow Aspiration and Bone Marrow Biopsy, Care After °Refer to this sheet in the next few weeks. These instructions provide you with information about caring for yourself after your procedure. Your health care provider may also give you more specific instructions. Your treatment has been planned according to current medical practices, but problems sometimes occur. Call your health care provider if you have any problems or questions after your procedure. °WHAT TO EXPECT AFTER THE PROCEDURE °After your procedure, it is common to have: °· Soreness or tenderness around the puncture site. °· Bruising. °HOME CARE INSTRUCTIONS °· Take medicines only as directed by your health care provider. °· Follow your health care provider's instructions about: °¨ Puncture site care. °¨ Bandage (dressing) changes and removal. °· Bathe and shower as directed by your health care provider. °· Check your puncture site every day for signs of infection. Watch for: °¨ Redness, swelling, or pain. °¨   Fluid, blood, or pus. °· Return to your normal activities as directed by your health care provider. °· Keep all follow-up visits as directed by your health care provider. This is important. °SEEK MEDICAL CARE IF: °· You have a fever. °· You have uncontrollable bleeding. °· You have  redness, swelling, or pain at the site of your puncture. °· You have fluid, blood, or pus coming from your puncture site. °  °This information is not intended to replace advice given to you by your health care provider. Make sure you discuss any questions you have with your health care provider. °  °Document Released: 11/21/2004 Document Revised: 09/18/2014 Document Reviewed: 04/25/2014 °Elsevier Interactive Patient Education ©2016 Elsevier Inc. ° °

## 2015-09-18 NOTE — Procedures (Signed)
Interventional Radiology Procedure Note  Procedure: CT guided aspirate and core biopsy of right iliac bone Complications: None Recommendations: - Bedrest supine x 1 hrs - Hydrocodone PRN  Pain - Follow biopsy results  Signed,  Heath K. McCullough, MD   

## 2015-09-23 ENCOUNTER — Telehealth (HOSPITAL_COMMUNITY): Payer: Self-pay | Admitting: *Deleted

## 2015-09-23 ENCOUNTER — Other Ambulatory Visit (HOSPITAL_COMMUNITY): Payer: Self-pay | Admitting: Oncology

## 2015-09-23 ENCOUNTER — Encounter (HOSPITAL_BASED_OUTPATIENT_CLINIC_OR_DEPARTMENT_OTHER): Payer: Medicare Other

## 2015-09-23 ENCOUNTER — Encounter (HOSPITAL_COMMUNITY): Payer: Medicare Other

## 2015-09-23 VITALS — BP 122/72 | HR 75 | Temp 97.3°F | Resp 18

## 2015-09-23 DIAGNOSIS — C9000 Multiple myeloma not having achieved remission: Secondary | ICD-10-CM | POA: Diagnosis not present

## 2015-09-23 DIAGNOSIS — D696 Thrombocytopenia, unspecified: Secondary | ICD-10-CM | POA: Diagnosis not present

## 2015-09-23 LAB — LACTATE DEHYDROGENASE: LDH: 178 U/L (ref 98–192)

## 2015-09-23 LAB — CBC WITH DIFFERENTIAL/PLATELET
BASOS ABS: 0 10*3/uL (ref 0.0–0.1)
BASOS PCT: 0 %
EOS ABS: 0.1 10*3/uL (ref 0.0–0.7)
Eosinophils Relative: 2 %
HCT: 23.8 % — ABNORMAL LOW (ref 39.0–52.0)
Hemoglobin: 7.8 g/dL — ABNORMAL LOW (ref 13.0–17.0)
Lymphocytes Relative: 31 %
Lymphs Abs: 0.9 10*3/uL (ref 0.7–4.0)
MCH: 38.2 pg — ABNORMAL HIGH (ref 26.0–34.0)
MCHC: 32.8 g/dL (ref 30.0–36.0)
MCV: 116.7 fL — ABNORMAL HIGH (ref 78.0–100.0)
Monocytes Absolute: 0.2 10*3/uL (ref 0.1–1.0)
Monocytes Relative: 8 %
NEUTROS ABS: 1.7 10*3/uL (ref 1.7–7.7)
Neutrophils Relative %: 59 %
Platelets: 13 10*3/uL — CL (ref 150–400)
RBC: 2.04 MIL/uL — AB (ref 4.22–5.81)
WBC: 2.9 10*3/uL — AB (ref 4.0–10.5)

## 2015-09-23 LAB — COMPREHENSIVE METABOLIC PANEL
ALBUMIN: 3.6 g/dL (ref 3.5–5.0)
ALK PHOS: 42 U/L (ref 38–126)
ALT: 19 U/L (ref 17–63)
ANION GAP: 12 (ref 5–15)
AST: 18 U/L (ref 15–41)
BUN: 55 mg/dL — AB (ref 6–20)
CALCIUM: 9.5 mg/dL (ref 8.9–10.3)
CO2: 26 mmol/L (ref 22–32)
CREATININE: 6.1 mg/dL — AB (ref 0.61–1.24)
Chloride: 102 mmol/L (ref 101–111)
GFR calc Af Amer: 10 mL/min — ABNORMAL LOW (ref >60)
GFR calc non Af Amer: 8 mL/min — ABNORMAL LOW (ref >60)
GLUCOSE: 118 mg/dL — AB (ref 65–99)
Potassium: 4.2 mmol/L (ref 3.5–5.1)
SODIUM: 140 mmol/L (ref 135–145)
Total Bilirubin: 0.5 mg/dL (ref 0.3–1.2)
Total Protein: 6.9 g/dL (ref 6.5–8.1)

## 2015-09-23 LAB — TISSUE HYBRIDIZATION (BONE MARROW)-NCBH

## 2015-09-23 LAB — CHROMOSOME ANALYSIS, BONE MARROW

## 2015-09-23 LAB — SEDIMENTATION RATE: SED RATE: 36 mm/h — AB (ref 0–16)

## 2015-09-23 MED ORDER — SODIUM CHLORIDE 0.9 % IV SOLN
INTRAVENOUS | Status: DC
Start: 2015-09-23 — End: 2015-09-23
  Administered 2015-09-23: 14:00:00 via INTRAVENOUS

## 2015-09-23 MED ORDER — HEPARIN SOD (PORK) LOCK FLUSH 100 UNIT/ML IV SOLN
500.0000 [IU] | Freq: Once | INTRAVENOUS | Status: AC
  Administered 2015-09-23: 500 [IU] via INTRAVENOUS

## 2015-09-23 MED ORDER — HYDROCODONE-ACETAMINOPHEN 5-325 MG PO TABS: 1.0000 | ORAL_TABLET | Freq: Four times a day (QID) | ORAL | Status: AC | PRN

## 2015-09-23 MED ORDER — ACETAMINOPHEN 325 MG PO TABS
650.0000 mg | ORAL_TABLET | Freq: Once | ORAL | Status: AC
  Administered 2015-09-23: 650 mg via ORAL

## 2015-09-23 MED ORDER — SODIUM CHLORIDE 0.9% FLUSH
10.0000 mL | INTRAVENOUS | Status: DC | PRN
  Administered 2015-09-23: 10 mL via INTRAVENOUS
  Filled 2015-09-23: qty 10

## 2015-09-23 MED ORDER — DIPHENHYDRAMINE HCL 25 MG PO CAPS
ORAL_CAPSULE | ORAL | Status: AC
  Filled 2015-09-23: qty 1

## 2015-09-23 MED ORDER — DIPHENHYDRAMINE HCL 25 MG PO CAPS
25.0000 mg | ORAL_CAPSULE | Freq: Once | ORAL | Status: AC
  Administered 2015-09-23: 25 mg via ORAL

## 2015-09-23 MED ORDER — ACETAMINOPHEN 325 MG PO TABS
ORAL_TABLET | ORAL | Status: AC
  Filled 2015-09-23: qty 2

## 2015-09-23 NOTE — Telephone Encounter (Signed)
Recommend platelet transfusion and 1 unit PRBC.  Orders are in.

## 2015-09-23 NOTE — Telephone Encounter (Signed)
Patient notified and orders placed and transfusion scheduled.

## 2015-09-23 NOTE — Patient Instructions (Signed)
[place] at [facility] Discharge Instructions  RECOMMENDATIONS MADE BY THE CONSULTANT AND ANY TEST RESULTS WILL BE SENT TO YOUR REFERRING PHYSICIAN.  Platelets today.    Thank you for choosing [place] at [facility] to provide your oncology and hematology care.  To afford each patient quality time with our provider, please arrive at least 15 minutes before your scheduled appointment time.   Beginning January 23rd 2017 lab work for the [facility] will be done in the  Main lab at [facility] on 1st floor. If you have a lab appointment with the [facility] please come in thru the  Main Entrance and check in at the main information desk  You need to re-schedule your appointment should you arrive 10 or more minutes late.  We strive to give you quality time with our providers, and arriving late affects you and other patients whose appointments are after yours.  Also, if you no show three or more times for appointments you may be dismissed from the clinic at the providers discretion.     Again, thank you for choosing [facility].  Our hope is that these requests will decrease the amount of time that you wait before being seen by our physicians.       _____________________________________________________________  Should you have questions after your visit to [facility], please contact our office at [phone] between the hours of 8:30 a.m. and 4:30 p.m.  Voicemails left after 4:30 p.m. will not be returned until the following business day.  For prescription refill requests, have your pharmacy contact our office.         Resources For Cancer Patients and their Caregivers ? American Cancer Society: Can assist with transportation, wigs, general needs, runs Look Good Feel Better.        425-420-9043 ? Cancer Care: Provides financial assistance, online support groups, medication/co-pay assistance.   1-800-813-HOPE 901-560-5297) ? [place] Assists [place] cancer patients and their families through emotional , educational and financial support.  276-806-8695 ? [place] DSS Where to apply for food stamps, Medicaid and utility assistance. 435-490-4010 ? RCATS: Transportation to medical appointments. (214)791-2719 ? Social Security Administration: May apply for disability if have a Stage IV cancer. 321-673-1259 408 882 0908 ? [employer], Disability and Transit Services: Assists with nutrition, care and transit needs. [place] Support Programs: @10RELATIVEDAYS @ > Cancer Support Group  2nd Tuesday of the month 1pm-2pm, Journey Room  > Creative Journey  3rd Tuesday of the month 1130am-1pm, Journey Room  > Look Good Feel Better  1st Wednesday of the month 10am-12 noon, Journey Room (Call [place] to register [phone])

## 2015-09-23 NOTE — Progress Notes (Signed)
Patient tolerated infusion well.  VSS throughout.   

## 2015-09-23 NOTE — Telephone Encounter (Signed)
CRITICAL VALUE ALERT Critical value received:  Platelets 13,000 and hgb 7.8 Date of notification:  09/23/2015 Time of notification: [id number] Critical value read back:  Yes.   Nurse who received alert:  TAR MD notified (1st page):  [id number]

## 2015-09-24 LAB — PROTEIN ELECTROPHORESIS, SERUM
A/G RATIO SPE: 1.4 (ref 0.7–1.7)
ALBUMIN ELP: 3.9 g/dL (ref 2.9–4.4)
ALPHA-1-GLOBULIN: 0.2 g/dL (ref 0.0–0.4)
ALPHA-2-GLOBULIN: 0.5 g/dL (ref 0.4–1.0)
Beta Globulin: 1 g/dL (ref 0.7–1.3)
GLOBULIN, TOTAL: 2.7 g/dL (ref 2.2–3.9)
Gamma Globulin: 1 g/dL (ref 0.4–1.8)
Total Protein ELP: 6.6 g/dL (ref 6.0–8.5)

## 2015-09-24 LAB — IGG, IGA, IGM
IgA: 319 mg/dL (ref 61–437)
IgG (Immunoglobin G), Serum: 928 mg/dL (ref 700–1600)
IgM, Serum: 74 mg/dL (ref 20–172)

## 2015-09-24 LAB — KAPPA/LAMBDA LIGHT CHAINS
KAPPA FREE LGHT CHN: 418.58 mg/L — AB (ref 3.30–19.40)
Kappa, lambda light chain ratio: 7.14 — ABNORMAL HIGH (ref 0.26–1.65)
LAMDA FREE LIGHT CHAINS: 58.65 mg/L — AB (ref 5.71–26.30)

## 2015-09-24 LAB — PREPARE PLATELET PHERESIS: UNIT DIVISION: 0

## 2015-09-24 LAB — BETA 2 MICROGLOBULIN, SERUM: Beta-2 Microglobulin: 15.4 mg/L — ABNORMAL HIGH (ref 0.6–2.4)

## 2015-09-25 ENCOUNTER — Other Ambulatory Visit (HOSPITAL_COMMUNITY): Payer: Medicare Other

## 2015-09-25 ENCOUNTER — Encounter (HOSPITAL_COMMUNITY): Payer: Medicare Other

## 2015-09-25 LAB — IMMUNOFIXATION ELECTROPHORESIS
IGA: 323 mg/dL (ref 61–437)
IgG (Immunoglobin G), Serum: 916 mg/dL (ref 700–1600)
IgM, Serum: 77 mg/dL (ref 20–172)
Total Protein ELP: 6.6 g/dL (ref 6.0–8.5)

## 2015-09-26 ENCOUNTER — Telehealth (HOSPITAL_COMMUNITY): Payer: Self-pay | Admitting: *Deleted

## 2015-09-26 ENCOUNTER — Encounter (HOSPITAL_COMMUNITY): Payer: Medicare Other

## 2015-09-26 DIAGNOSIS — C9000 Multiple myeloma not having achieved remission: Secondary | ICD-10-CM

## 2015-09-26 LAB — CBC
HCT: 25 % — ABNORMAL LOW (ref 39.0–52.0)
HEMOGLOBIN: 8.4 g/dL — AB (ref 13.0–17.0)
MCH: 38.9 pg — AB (ref 26.0–34.0)
MCHC: 33.6 g/dL (ref 30.0–36.0)
MCV: 115.7 fL — AB (ref 78.0–100.0)
PLATELETS: 17 10*3/uL — AB (ref 150–400)
RBC: 2.16 MIL/uL — ABNORMAL LOW (ref 4.22–5.81)
RDW: 24.2 % — ABNORMAL HIGH (ref 11.5–15.5)
WBC: 2.7 10*3/uL — ABNORMAL LOW (ref 4.0–10.5)

## 2015-09-26 LAB — PREPARE RBC (CROSSMATCH)

## 2015-09-26 NOTE — Progress Notes (Unsigned)
Discussed lab values with Dr. Whitney Muse. We will cancel blood transfusion for tomorrow and give [place] platelets instead.

## 2015-09-26 NOTE — Telephone Encounter (Signed)
CRITICAL VALUE ALERT Critical value received:  Platelets 17,000, hgb 8.4 Date of notification:  09/26/2015 Time of notification: [id number] Critical value read back:  Yes.   Nurse who received alert:  Drema Balzarine RN MD notified (1st page):  Sheldon Silvan

## 2015-09-27 ENCOUNTER — Encounter (HOSPITAL_BASED_OUTPATIENT_CLINIC_OR_DEPARTMENT_OTHER): Payer: Medicare Other

## 2015-09-27 VITALS — BP 140/87 | HR 74 | Temp 97.8°F | Resp 16

## 2015-09-27 DIAGNOSIS — D61818 Other pancytopenia: Secondary | ICD-10-CM | POA: Diagnosis not present

## 2015-09-27 DIAGNOSIS — C9000 Multiple myeloma not having achieved remission: Secondary | ICD-10-CM | POA: Diagnosis not present

## 2015-09-27 MED ORDER — ACETAMINOPHEN 325 MG PO TABS
650.0000 mg | ORAL_TABLET | Freq: Once | ORAL | Status: AC
  Administered 2015-09-27: 650 mg via ORAL
  Filled 2015-09-27: qty 2

## 2015-09-27 MED ORDER — SODIUM CHLORIDE 0.9 % IV SOLN
250.0000 mL | Freq: Once | INTRAVENOUS | Status: AC
  Administered 2015-09-27: 250 mL via INTRAVENOUS

## 2015-09-27 MED ORDER — HEPARIN SOD (PORK) LOCK FLUSH 100 UNIT/ML IV SOLN
500.0000 [IU] | Freq: Every day | INTRAVENOUS | Status: AC | PRN
  Administered 2015-09-27: 500 [IU]
  Filled 2015-09-27 (×2): qty 5

## 2015-09-27 MED ORDER — DIPHENHYDRAMINE HCL 25 MG PO CAPS
25.0000 mg | ORAL_CAPSULE | Freq: Once | ORAL | Status: AC
  Administered 2015-09-27: 25 mg via ORAL
  Filled 2015-09-27: qty 1

## 2015-09-27 MED ORDER — SODIUM CHLORIDE 0.9% FLUSH: 10.0000 mL | INTRAVENOUS | Status: DC | PRN

## 2015-09-27 NOTE — Progress Notes (Signed)
Patient tolerated transfusion well.  VSS throughout.

## 2015-09-27 NOTE — Patient Instructions (Signed)
[place] at [facility] Discharge Instructions  RECOMMENDATIONS MADE BY THE CONSULTANT AND ANY TEST RESULTS WILL BE SENT TO YOUR REFERRING PHYSICIAN.  One unit of platelets.    Thank you for choosing [place] at [facility] to provide your oncology and hematology care.  To afford each patient quality time with our provider, please arrive at least 15 minutes before your scheduled appointment time.   Beginning January 23rd 2017 lab work for the [facility] will be done in the  Main lab at [facility] on 1st floor. If you have a lab appointment with the [facility] please come in thru the  Main Entrance and check in at the main information desk  You need to re-schedule your appointment should you arrive 10 or more minutes late.  We strive to give you quality time with our providers, and arriving late affects you and other patients whose appointments are after yours.  Also, if you no show three or more times for appointments you may be dismissed from the clinic at the providers discretion.     Again, thank you for choosing [facility].  Our hope is that these requests will decrease the amount of time that you wait before being seen by our physicians.       _____________________________________________________________  Should you have questions after your visit to [facility], please contact our office at [phone] between the hours of 8:30 a.m. and 4:30 p.m.  Voicemails left after 4:30 p.m. will not be returned until the following business day.  For prescription refill requests, have your pharmacy contact our office.         Resources For Cancer Patients and their Caregivers ? American Cancer Society: Can assist with transportation, wigs, general needs, runs Look Good Feel Better.        7202204597 ? Cancer Care: Provides financial assistance, online support groups, medication/co-pay assistance.   1-800-813-HOPE (724) 825-3520) ? [place] Assists [place] cancer patients and their families through emotional , educational and financial support.  410-216-1578 ? [place] DSS Where to apply for food stamps, Medicaid and utility assistance. 5172123716 ? RCATS: Transportation to medical appointments. 606-356-1103 ? Social Security Administration: May apply for disability if have a Stage IV cancer. 619-204-6159 (253) 074-3842 ? [employer], Disability and Transit Services: Assists with nutrition, care and transit needs. [place] Support Programs: @10RELATIVEDAYS @ > Cancer Support Group  2nd Tuesday of the month 1pm-2pm, Journey Room  > Creative Journey  3rd Tuesday of the month 1130am-1pm, Journey Room  > Look Good Feel Better  1st Wednesday of the month 10am-12 noon, Journey Room (Call [place] to register [phone])

## 2015-09-28 LAB — PREPARE PLATELET PHERESIS: Unit division: 0

## 2015-09-30 ENCOUNTER — Other Ambulatory Visit (HOSPITAL_COMMUNITY): Payer: Medicare Other

## 2015-09-30 LAB — TYPE AND SCREEN
ABO/RH(D): A POS
Antibody Screen: NEGATIVE
Unit division: 0

## 2015-10-01 ENCOUNTER — Observation Stay (HOSPITAL_COMMUNITY)
Admission: EM | Admit: 2015-10-01 | Discharge: 2015-10-05 | Disposition: A | Discharge status: Home / Self Care | Payer: Medicare Other | Attending: Internal Medicine | Admitting: Internal Medicine

## 2015-10-01 ENCOUNTER — Encounter (HOSPITAL_COMMUNITY): Payer: Self-pay

## 2015-10-01 ENCOUNTER — Emergency Department (HOSPITAL_COMMUNITY): Payer: Medicare Other

## 2015-10-01 DIAGNOSIS — I483 Typical atrial flutter: Secondary | ICD-10-CM | POA: Diagnosis not present

## 2015-10-01 DIAGNOSIS — J9691 Respiratory failure, unspecified with hypoxia: Secondary | ICD-10-CM | POA: Insufficient documentation

## 2015-10-01 DIAGNOSIS — I5020 Unspecified systolic (congestive) heart failure: Secondary | ICD-10-CM

## 2015-10-01 DIAGNOSIS — K5900 Constipation, unspecified: Secondary | ICD-10-CM | POA: Diagnosis not present

## 2015-10-01 DIAGNOSIS — Z6832 Body mass index (BMI) 32.0-32.9, adult: Secondary | ICD-10-CM | POA: Diagnosis not present

## 2015-10-01 DIAGNOSIS — E669 Obesity, unspecified: Secondary | ICD-10-CM | POA: Insufficient documentation

## 2015-10-01 DIAGNOSIS — I10 Essential (primary) hypertension: Secondary | ICD-10-CM | POA: Diagnosis present

## 2015-10-01 DIAGNOSIS — I132 Hypertensive heart and chronic kidney disease with heart failure and with stage 5 chronic kidney disease, or end stage renal disease: Secondary | ICD-10-CM | POA: Diagnosis not present

## 2015-10-01 DIAGNOSIS — D696 Thrombocytopenia, unspecified: Secondary | ICD-10-CM | POA: Diagnosis not present

## 2015-10-01 DIAGNOSIS — R748 Abnormal levels of other serum enzymes: Secondary | ICD-10-CM | POA: Diagnosis not present

## 2015-10-01 DIAGNOSIS — N186 End stage renal disease: Secondary | ICD-10-CM | POA: Insufficient documentation

## 2015-10-01 DIAGNOSIS — I429 Cardiomyopathy, unspecified: Secondary | ICD-10-CM | POA: Diagnosis not present

## 2015-10-01 DIAGNOSIS — R0902 Hypoxemia: Secondary | ICD-10-CM | POA: Diagnosis present

## 2015-10-01 DIAGNOSIS — Z79899 Other long term (current) drug therapy: Secondary | ICD-10-CM | POA: Insufficient documentation

## 2015-10-01 DIAGNOSIS — D638 Anemia in other chronic diseases classified elsewhere: Secondary | ICD-10-CM | POA: Diagnosis not present

## 2015-10-01 DIAGNOSIS — J90 Pleural effusion, not elsewhere classified: Secondary | ICD-10-CM | POA: Insufficient documentation

## 2015-10-01 DIAGNOSIS — I4892 Unspecified atrial flutter: Secondary | ICD-10-CM | POA: Diagnosis not present

## 2015-10-01 DIAGNOSIS — E8779 Other fluid overload: Secondary | ICD-10-CM

## 2015-10-01 DIAGNOSIS — C9000 Multiple myeloma not having achieved remission: Secondary | ICD-10-CM | POA: Diagnosis not present

## 2015-10-01 DIAGNOSIS — I5023 Acute on chronic systolic (congestive) heart failure: Secondary | ICD-10-CM | POA: Insufficient documentation

## 2015-10-01 DIAGNOSIS — Z9481 Bone marrow transplant status: Secondary | ICD-10-CM | POA: Insufficient documentation

## 2015-10-01 DIAGNOSIS — E8770 Fluid overload, unspecified: Secondary | ICD-10-CM | POA: Diagnosis present

## 2015-10-01 DIAGNOSIS — Z992 Dependence on renal dialysis: Secondary | ICD-10-CM | POA: Diagnosis not present

## 2015-10-01 DIAGNOSIS — R778 Other specified abnormalities of plasma proteins: Secondary | ICD-10-CM | POA: Diagnosis present

## 2015-10-01 DIAGNOSIS — R7989 Other specified abnormal findings of blood chemistry: Secondary | ICD-10-CM

## 2015-10-01 HISTORY — DX: Hypoxemia: R09.02

## 2015-10-01 LAB — CBC
HCT: 23.3 % — ABNORMAL LOW (ref 39.0–52.0)
Hemoglobin: 7.4 g/dL — ABNORMAL LOW (ref 13.0–17.0)
MCH: 37.9 pg — AB (ref 26.0–34.0)
MCHC: 31.8 g/dL (ref 30.0–36.0)
MCV: 119.5 fL — AB (ref 78.0–100.0)
PLATELETS: 21 10*3/uL — AB (ref 150–400)
RBC: 1.95 MIL/uL — ABNORMAL LOW (ref 4.22–5.81)
RDW: 23.7 % — AB (ref 11.5–15.5)
WBC: 3.7 10*3/uL — AB (ref 4.0–10.5)

## 2015-10-01 LAB — BASIC METABOLIC PANEL
ANION GAP: 10 (ref 5–15)
BUN: 47 mg/dL — ABNORMAL HIGH (ref 6–20)
CALCIUM: 10 mg/dL (ref 8.9–10.3)
CO2: 26 mmol/L (ref 22–32)
Chloride: 106 mmol/L (ref 101–111)
Creatinine, Ser: 6.69 mg/dL — ABNORMAL HIGH (ref 0.61–1.24)
GFR calc Af Amer: 9 mL/min — ABNORMAL LOW (ref >60)
GFR, EST NON AFRICAN AMERICAN: 8 mL/min — AB (ref >60)
Glucose, Bld: 122 mg/dL — ABNORMAL HIGH (ref 65–99)
POTASSIUM: 4.9 mmol/L (ref 3.5–5.1)
SODIUM: 142 mmol/L (ref 135–145)

## 2015-10-01 LAB — LACTIC ACID, PLASMA: Lactic Acid, Venous: 0.7 mmol/L (ref 0.5–2.0)

## 2015-10-01 LAB — I-STAT TROPONIN, ED: TROPONIN I, POC: 0.11 ng/mL — AB (ref 0.00–0.08)

## 2015-10-01 LAB — HEPATITIS B SURFACE ANTIGEN: Hepatitis B Surface Ag: NEGATIVE

## 2015-10-01 LAB — CBC WITH DIFFERENTIAL/PLATELET
BASOS ABS: 0 10*3/uL (ref 0.0–0.1)
Basophils Relative: 0 %
EOS ABS: 0.1 10*3/uL (ref 0.0–0.7)
EOS PCT: 2 %
HCT: 23.4 % — ABNORMAL LOW (ref 39.0–52.0)
Hemoglobin: 7.5 g/dL — ABNORMAL LOW (ref 13.0–17.0)
Lymphocytes Relative: 25 %
Lymphs Abs: 0.9 10*3/uL (ref 0.7–4.0)
MCH: 38.1 pg — ABNORMAL HIGH (ref 26.0–34.0)
MCHC: 32.1 g/dL (ref 30.0–36.0)
MCV: 118.8 fL — ABNORMAL HIGH (ref 78.0–100.0)
MONO ABS: 0.2 10*3/uL (ref 0.1–1.0)
Monocytes Relative: 7 %
NEUTROS PCT: 66 %
Neutro Abs: 2.3 10*3/uL (ref 1.7–7.7)
PLATELETS: 24 10*3/uL — AB (ref 150–400)
RBC: 1.97 MIL/uL — AB (ref 4.22–5.81)
RDW: 24.5 % — AB (ref 11.5–15.5)
WBC: 3.5 10*3/uL — AB (ref 4.0–10.5)

## 2015-10-01 LAB — RENAL FUNCTION PANEL
Albumin: 3.4 g/dL — ABNORMAL LOW (ref 3.5–5.0)
Anion gap: 11 (ref 5–15)
BUN: 55 mg/dL — AB (ref 6–20)
CALCIUM: 9.8 mg/dL (ref 8.9–10.3)
CHLORIDE: 105 mmol/L (ref 101–111)
CO2: 25 mmol/L (ref 22–32)
CREATININE: 7.41 mg/dL — AB (ref 0.61–1.24)
GFR calc Af Amer: 8 mL/min — ABNORMAL LOW (ref >60)
GFR, EST NON AFRICAN AMERICAN: 7 mL/min — AB (ref >60)
GLUCOSE: 137 mg/dL — AB (ref 65–99)
PHOSPHORUS: 5.1 mg/dL — AB (ref 2.5–4.6)
Potassium: 5 mmol/L (ref 3.5–5.1)
Sodium: 141 mmol/L (ref 135–145)

## 2015-10-01 LAB — I-STAT CHEM 8, ED
BUN: 60 mg/dL — ABNORMAL HIGH (ref 6–20)
Calcium, Ion: 1.16 mmol/L (ref 1.13–1.30)
Chloride: 106 mmol/L (ref 101–111)
Creatinine, Ser: 6.7 mg/dL — ABNORMAL HIGH (ref 0.61–1.24)
Glucose, Bld: 124 mg/dL — ABNORMAL HIGH (ref 65–99)
HEMATOCRIT: 25 % — AB (ref 39.0–52.0)
HEMOGLOBIN: 8.5 g/dL — AB (ref 13.0–17.0)
POTASSIUM: 5.4 mmol/L — AB (ref 3.5–5.1)
SODIUM: 140 mmol/L (ref 135–145)
TCO2: 20 mmol/L (ref 0–100)

## 2015-10-01 LAB — TROPONIN I
TROPONIN I: 0.11 ng/mL — AB (ref <0.031)
Troponin I: 0.1 ng/mL — ABNORMAL HIGH (ref <0.031)
Troponin I: 0.11 ng/mL — ABNORMAL HIGH (ref <0.031)

## 2015-10-01 MED ORDER — FUROSEMIDE 40 MG PO TABS
40.0000 mg | ORAL_TABLET | Freq: Every day | ORAL | Status: DC
  Administered 2015-10-01 – 2015-10-05 (×4): 40 mg via ORAL
  Filled 2015-10-01 (×3): qty 1
  Filled 2015-10-01: qty 2

## 2015-10-01 MED ORDER — LIDOCAINE HCL (PF) 1 % IJ SOLN: 5.0000 mL | INTRAMUSCULAR | Status: DC | PRN

## 2015-10-01 MED ORDER — DOCUSATE SODIUM 100 MG PO CAPS
100.0000 mg | ORAL_CAPSULE | Freq: Two times a day (BID) | ORAL | Status: DC
Start: 2015-10-01 — End: 2015-10-06
  Administered 2015-10-01 – 2015-10-05 (×7): 100 mg via ORAL
  Filled 2015-10-01 (×8): qty 1

## 2015-10-01 MED ORDER — SODIUM CHLORIDE 0.9 % IV SOLN: 100.0000 mL | INTRAVENOUS | Status: DC | PRN

## 2015-10-01 MED ORDER — PREGABALIN 75 MG PO CAPS
75.0000 mg | ORAL_CAPSULE | Freq: Every day | ORAL | Status: DC
Start: 2015-10-01 — End: 2015-10-06
  Administered 2015-10-01 – 2015-10-05 (×4): 75 mg via ORAL
  Filled 2015-10-01 (×4): qty 1

## 2015-10-01 MED ORDER — SODIUM CHLORIDE 0.9% FLUSH
10.0000 mL | INTRAVENOUS | Status: DC | PRN
  Administered 2015-10-03: 10 mL
  Filled 2015-10-01: qty 40

## 2015-10-01 MED ORDER — AMLODIPINE BESYLATE 5 MG PO TABS
5.0000 mg | ORAL_TABLET | Freq: Every day | ORAL | Status: DC
  Administered 2015-10-01 – 2015-10-03 (×3): 5 mg via ORAL
  Filled 2015-10-01 (×3): qty 1

## 2015-10-01 MED ORDER — PENTAFLUOROPROP-TETRAFLUOROETH EX AERO: 1.0000 "application " | INHALATION_SPRAY | CUTANEOUS | Status: DC | PRN

## 2015-10-01 MED ORDER — ALTEPLASE 2 MG IJ SOLR: 2.0000 mg | Freq: Once | INTRAMUSCULAR | Status: DC | PRN

## 2015-10-01 MED ORDER — CALCIUM ACETATE (PHOS BINDER) 667 MG PO CAPS
1334.0000 mg | ORAL_CAPSULE | Freq: Three times a day (TID) | ORAL | Status: DC
  Filled 2015-10-01: qty 2

## 2015-10-01 MED ORDER — ONDANSETRON HCL 4 MG PO TABS: 8.0000 mg | ORAL_TABLET | Freq: Three times a day (TID) | ORAL | Status: DC | PRN

## 2015-10-01 MED ORDER — CALCIUM ACETATE (PHOS BINDER) 667 MG PO CAPS
2001.0000 mg | ORAL_CAPSULE | Freq: Three times a day (TID) | ORAL | Status: DC
  Administered 2015-10-01 – 2015-10-05 (×11): 2001 mg via ORAL
  Filled 2015-10-01 (×11): qty 3

## 2015-10-01 MED ORDER — LISINOPRIL 10 MG PO TABS
20.0000 mg | ORAL_TABLET | Freq: Every day | ORAL | Status: DC
  Administered 2015-10-01 – 2015-10-03 (×3): 20 mg via ORAL
  Filled 2015-10-01 (×2): qty 2
  Filled 2015-10-01: qty 1
  Filled 2015-10-01: qty 2

## 2015-10-01 MED ORDER — HYDROCODONE-ACETAMINOPHEN 5-325 MG PO TABS
1.0000 | ORAL_TABLET | Freq: Four times a day (QID) | ORAL | Status: DC | PRN
  Administered 2015-10-01 – 2015-10-04 (×5): 1 via ORAL
  Filled 2015-10-01 (×5): qty 1

## 2015-10-01 MED ORDER — HEPARIN SODIUM (PORCINE) 1000 UNIT/ML DIALYSIS: 1000.0000 [IU] | INTRAMUSCULAR | Status: DC | PRN

## 2015-10-01 MED ORDER — LIDOCAINE-PRILOCAINE 2.5-2.5 % EX CREA: 1.0000 "application " | TOPICAL_CREAM | CUTANEOUS | Status: DC | PRN

## 2015-10-01 NOTE — Progress Notes (Signed)
Upon patient's arrival to floor pt found to be in a flutter.  EKG done to confirm.  Was told pt had no prior cardiac history.  Schorr notified, pt asymptomatic.  RN will continue to monitor.  Claudette Stapler, RN

## 2015-10-01 NOTE — H&P (Signed)
History and Physical    [name] ZOX:[id number] DOB: 1946/01/19 DOA: 10/01/2015  Referring MD/NP/PA: Dr. Claudine Mouton PCP: No PCP Per Patient Outpatient Specialists: Nephrology in [place] Patient coming from: ED  Chief Complaint: OD  HPI: [name] is a 70 y.o. 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  Patient states he is unsure but may have accidentally taken his blood pressure medications (lisinopril 53m and amlodipine 5 mg) twice last evening.  No CP, mild SOB, no cough.  ED Course: Patient is mildly hypertensive and tachycardic.  Mildly hypoxic satting better with O2.  Has pleural effusions on CXR.  EDP called nephrology who says to have medicine admit the patient and they will see in the morning.  Review of Systems: As per HPI otherwise 10 point review of systems negative.    Past Medical History  Diagnosis Date  . Hypertension   . Ruptured cervical disc   . Fall resulting in striking against other object     paralyzed  . Multiple myeloma   . Recurrent multiple myeloma of bone marrow with unknown EBV status ([facility]   . Multiple myeloma ([place] 01/26/2011  . Unspecified vitamin D deficiency 03/06/2013  . Anemia of chronic disease 12/30/2013  . Constipation   . [place]  . Thrombocytopenia ([facility] 08/31/2014  . 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 Constipation     Past Surgical History  Procedure Laterality Date  . Inguinal hernia repair Bilateral   . Anterior cervical decomp/discectomy fusion    . Bone marrow aspirate and biopsy wiith lumbar puncture    . Melanoma excision      rt. breast  . Cervical spine surgery    . Kyphoplasty Bilateral 01/30/2014    Procedure: Thoracic eleven Kyphoplasty;  Surgeon: NConsuella Lose MD;  Location: [facility];  Service: Neurosurgery;  Laterality: Bilateral;  Thoracic eleven Kyphoplasty  . Av fistula placement Right 04/30/2014    Procedure: BRACHIOCEPHALIC ARTERIOVENOUS (AV) FISTULA CREATION;  Surgeon: BConrad Foster MD;  [place]  Service: Vascular;  Laterality: Right;  . Portacath placement Left 02/18/2015    Procedure: INSERTION OF PORT A CATH RIGHT INTERNAL JUGULAR VEIN ;  Surgeon: BConrad Cas[place] MD;  [place] [place]  Service: Vascular;  Laterality: Left;  . Cataract extraction w/phaco Right 04/15/2015    Procedure: CATARACT EXTRACTION PHACO AND INTRAOCULAR LENS PLACEMENT (IOC);  Surgeon: KTonny Branch MD;  Location: [facility];  Service: Ophthalmology;  Laterality: Right;  CDE:8.97  . Cataract extraction w/phaco Left 04/29/2015    Procedure: CATARACT EXTRACTION PHACO AND INTRAOCULAR LENS PLACEMENT ; CDE:  7.61;  Surgeon: KTonny Branch MD;  Location: [facility];  Service: Ophthalmology;  Laterality: Left;     reports that he has never smoked. He has never used smokeless tobacco. He reports that he does not drink alcohol or use illicit drugs.  Allergies  Allergen Reactions  . Pamidronate Anaphylaxis    blood pressure     Family History  Problem Relation Age of Onset  . Cancer Sister   . Cancer Brother      Prior to Admission medications   Medication Sig Start Date End Date Taking? Authorizing Provider  amLODipine (NORVASC) 5 MG tablet  08/17/15  Yes Historical Provider, MD  docusate sodium (COLACE) 100 MG capsule Take 100 mg by mouth 2 (two) times daily.   Yes Historical Provider, MD  furosemide (LASIX) 20 MG tablet Take 40 mg by mouth daily.    Yes Historical Provider, MD  HYDROcodone-acetaminophen (NORCO/VICODIN) 5-325 MG tablet Take 1 tablet by mouth every 6 (six) hours as needed for severe pain. 09/23/15  Yes Manon Hilding Kefalas, PA-C  lisinopril (PRINIVIL,ZESTRIL) 20 MG tablet Take 20 mg by mouth every morning.  06/27/15  Yes Historical Provider, MD  Omega-3  Fatty Acids (FISH OIL) 1000 MG CAPS Take 2 capsules by mouth daily.   Yes Historical Provider, MD  ondansetron (ZOFRAN) 8 MG tablet Take 1 tablet (8 mg total) by mouth every 8 (eight) hours as needed for nausea or vomiting. 03/15/15  Yes Manon Hilding Kefalas, PA-C  pregabalin (LYRICA) 75 MG capsule Take 75 mg by mouth daily.   Yes Historical Provider, MD  torsemide (DEMADEX) 20 MG tablet  09/02/15  Yes Historical Provider, MD    Physical Exam: Filed Vitals:   10/01/15 0057 10/01/15 0059 10/01/15 0100  BP:  158/116 160/112  Pulse:  117 117  Temp:  98.5 F (36.9 C)   TempSrc:  Oral   Resp:  22 19  Height:  _0  (1.803 m)   Weight:  107.049 kg (236 lb)   SpO2: 90% 97% 97%      Constitutional: NAD, calm, comfortable Filed Vitals:   10/01/15 0057 10/01/15 0059 10/01/15 0100  BP:  158/116 160/112  Pulse:  117 117  Temp:  98.5 F (36.9 C)   TempSrc:  Oral   Resp:  22 19  Height:  _1  (1.803 m)   Weight:  107.049 kg (236 lb)   SpO2: 90% 97% 97%   Eyes: PERRL, lids and conjunctivae normal ENMT: Mucous membranes are moist. Posterior pharynx clear of any exudate or lesions.Normal dentition.  Neck: normal, supple, no masses, no thyromegaly Respiratory: clear to auscultation bilaterally, no wheezing, no crackles. Normal respiratory effort. No accessory muscle use.  Cardiovascular: Regular rate and rhythm, no murmurs / rubs / gallops. No extremity edema. 2+ pedal pulses. No carotid bruits.  Abdomen: no tenderness, no masses palpated. No hepatosplenomegaly. Bowel sounds positive.  Musculoskeletal: no clubbing / cyanosis. No joint deformity upper and lower extremities. Good ROM, no contractures. Normal muscle tone.  Skin: no rashes, lesions, ulcers. No induration Neurologic: CN 2-12 grossly intact. Sensation intact, DTR normal. Strength 5/5 in all 4.  Psychiatric: Normal judgment and insight. Alert and oriented x 3. Normal mood.    Labs on Admission: I have personally reviewed  following labs and imaging studies  CBC:  Recent Labs Lab 09/26/15 1357 10/01/15 0112 10/01/15 0138  WBC 2.7* 3.5*  --   NEUTROABS  --  2.3  --   HGB 8.4* 7.5* 8.5*  HCT 25.0* 23.4* 25.0*  MCV 115.7* 118.8*  --   PLT 17* 24*  --    Basic Metabolic Panel:  Recent Labs Lab 10/01/15 0112 10/01/15 0138  NA 142 140  K 4.9 5.4*  CL 106 106  CO2 26  --   GLUCOSE 122* 124*  BUN 47* 60*  CREATININE 6.69* 6.70*  CALCIUM 10.0  --    GFR: Estimated Creatinine Clearance: 13.1 mL/min (by C-G formula based on Cr of 6.7). Liver Function Tests: No results for input(s): AST, ALT, ALKPHOS, BILITOT, PROT, ALBUMIN in the last 168 hours. No results for input(s): LIPASE, AMYLASE in the last 168 hours. No  results for input(s): AMMONIA in the last 168 hours. Coagulation Profile: No results for input(s): INR, PROTIME in the last 168 hours. Cardiac Enzymes: No results for input(s): CKTOTAL, CKMB, CKMBINDEX, TROPONINI in the last 168 hours. BNP (last 3 results) No results for input(s): PROBNP in the last 8760 hours. HbA1C: No results for input(s): HGBA1C in the last 72 hours. CBG: No results for input(s): GLUCAP in the last 168 hours. Lipid Profile: No results for input(s): CHOL, HDL, LDLCALC, TRIG, CHOLHDL, LDLDIRECT in the last 72 hours. Thyroid Function Tests: No results for input(s): TSH, T4TOTAL, FREET4, T3FREE, THYROIDAB in the last 72 hours. Anemia Panel: No results for input(s): VITAMINB12, FOLATE, FERRITIN, TIBC, IRON, RETICCTPCT in the last 72 hours. Urine analysis:    Component Value Date/Time   COLORURINE YELLOW 01/10/2014 2045   APPEARANCEUR CLEAR 01/10/2014 2045   LABSPEC 1.020 01/10/2014 2045   PHURINE 5.5 01/10/2014 2045   GLUCOSEU NEGATIVE 01/10/2014 2045   HGBUR LARGE* 01/10/2014 2045   BILIRUBINUR NEGATIVE 01/10/2014 2045   [place] 01/10/2014 2045   PROTEINUR 100* 01/10/2014 2045   UROBILINOGEN 0.2 01/10/2014 2045   NITRITE NEGATIVE 01/10/2014 2045     LEUKOCYTESUR NEGATIVE 01/10/2014 2045   Sepsis Labs: _0 (procalcitonin:4,lacticidven:4) )No results found for this or any previous visit (from the past 240 hour(s)).   Radiological Exams on Admission: Dg Chest 2 View  10/01/2015  CLINICAL DATA:  70 year old male, dialysis patient, with shortness of breath. EXAM: CHEST  2 VIEW COMPARISON:  Chest radiograph dated 07/14/2015 FINDINGS: Small bilateral pleural effusions appear increased compared to prior study. Bibasilar densities may represent atelectatic changes or pneumonia. No pneumothorax. There is stable cardiomegaly. Right pectoral central venous catheter with tip over central SVC. Right axillary vascular stent. There is osteopenia with degenerative changes of the spine. Lower thoracic vertebroplasty changes. IMPRESSION: Small bilateral pleural effusions as well as bibasilar atelectasis/ infiltrate, with interval progression compared to the prior study. Electronically Signed   By: Anner Crete M.D.   On: 10/01/2015 02:05    EKG: Independently reviewed.  Assessment/Plan Active Problems:   Hypoxia   Fluid overload    Hypoxia / fluid overload in ESRD patient -  Admitting patient for observation and dialysis  Nephrology to see and dialyze in AM (is normally dialyzed TTS anyhow)  Continue home meds  Will keep on tele monitor given the mild tachycardia  If tachycardia / hypoxia persists beyond dialysis then may need further work up.  Will get serial troponins as well, although his troponin today in ED of 0.11 appears c/w his baseline back in Feb.     DVT prophylaxis: SCDs only due to thrombocytopenia Code Status: Full Family Communication: No family in room Consults called: Nephrology consulted by EDP Admission status: Admit to obs   Etta Quill DO [facility] Pager [phone] from 7PM-7AM  If 7AM-7PM, please contact the day physician for the patient [email or web address] Password [email or web address]  10/01/2015, 3:14 AM

## 2015-10-01 NOTE — Consult Note (Signed)
Patient ID: [name] MRN: [id number], DOB/AGE: 68-Apr-1947   Admit date: 10/01/2015   Provider Requesting Consult: Dr. Reyne Dumas, Internal Medicine   Primary Physician: No PCP Per Patient Primary Cardiologist: New  Pt. 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 Cardiology consultation placed for recommendations regarding new onset atrial flutter and systolic HF.   Problem List  Past Medical History  Diagnosis Date  . Hypertension   . Ruptured cervical disc   . Fall resulting in striking against other object     paralyzed  . Multiple myeloma   . Recurrent multiple myeloma of bone marrow with unknown EBV status ([place])   . Multiple myeloma ([place]) 01/26/2011  . Unspecified vitamin D deficiency 03/06/2013  . Anemia of chronic disease 12/30/2013  . Constipation   . Dialysis patient ([place]) 2016  . Thrombocytopenia ([place]) 08/31/2014  . 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 Constipation   . Hypoxia 09/2015    Past Surgical History  Procedure Laterality Date  . Inguinal hernia repair Bilateral   . Anterior cervical decomp/discectomy fusion    . Bone marrow aspirate and biopsy wiith lumbar puncture    . Melanoma excision      rt. breast  . Cervical spine surgery    . Kyphoplasty Bilateral 01/30/2014    Procedure: Thoracic eleven Kyphoplasty;   Surgeon: Consuella Lose, MD;  Location: [facility];  Service: Neurosurgery;  Laterality: Bilateral;  Thoracic eleven Kyphoplasty  . Av fistula placement Right 04/30/2014    Procedure: BRACHIOCEPHALIC ARTERIOVENOUS (AV) FISTULA CREATION;  Surgeon: Conrad Chase Crossing, MD;  Location: [facility];  Service: Vascular;  Laterality: Right;  . Portacath placement Left 02/18/2015    Procedure: INSERTION OF PORT A CATH RIGHT INTERNAL JUGULAR VEIN ;  Surgeon: Conrad Cecil-Bishop, MD;  Location: [place];  Service: Vascular;  Laterality: Left;  . Cataract extraction w/phaco Right 04/15/2015    Procedure: CATARACT EXTRACTION PHACO AND INTRAOCULAR LENS PLACEMENT (IOC);  Surgeon: Tonny Branch, MD;  Location: [facility];  Service: Ophthalmology;  Laterality: Right;  CDE:8.97  . Cataract extraction w/phaco Left 04/29/2015    Procedure: CATARACT EXTRACTION PHACO AND INTRAOCULAR LENS PLACEMENT ; CDE:  7.61;  Surgeon: Tonny Branch, MD;  Location: [facility];  Service: Ophthalmology;  Laterality: Left;     Allergies  Allergies  Allergen Reactions  . Pamidronate Anaphylaxis    blood pressure     HPI  The patient is a 70 y/o male with a complex medical history including h/o multiple myeloma, s/p 2 autologous bone marrow transplanst at [facility] with the second bone marrow transplant occuring in July 2013. He is followed by oncology and has undergone chemotherapy treatments with the use of Revlimid (lenalidomide). He also has a h/o ESRD on HD (T, TH, Sat) and HTN.   Per records, he was admitted to [facility] in March of this year by IM for acute n/v/d, felt  to be acute viral gastroenteritis. It appears that a 2D echo was also obtained on 07/15/15, with indication reading "CHF", although this is not outlined in discharge summary. EF was noted to be reduced at 25% with moderate LVH noted and diffuse hypokinesis. The study was not technically sufficient to allow evaluation of LV diastolic dysfunction. Mild to moderate MR was also noted. His EF  was significantly reduced compared to prior 2D echo in 2009 when EF was normal at 60%. He also had an echo at [facility] in 2015 that showed normal EF at 50-55%. Per hospital records, cardiology was not consulted during hospitalization in February.   Since that time, patient was readmitted in March for platelet transfusion + PRBC transfusion. His platelet count was 7K with no active bleeding. Hgb was 7. He received 1 unit of PRBCs and 1 unit of platelets with resultant counts of 8.4 and 20 respectively.  Patient presented to [facility] on early morning hours on 10/01/15 for suspected accidental overdose. Patient states he is unsure but may have accidentally taken his blood pressure medications (lisinopril 56m and amlodipine 5 mg) twice last evening.On arrival, patient was noted to be tachypneic and tachycardic on exam with oxygen saturation less than 90%. EKG showed new onset atrial flutter with a CVR. CXR showed small bilateral pleural effusions. Cardiology has been consulted for recommendations regarding his new atrial arrhthymia and reduced LV systolic function.   Platelet count is 24K. Hgb 7.5. Scr 6.70. K 5.4. Troponin abnormal with flat trend at 0.10 and 0.11.   In interview with the patient, he denies any prior h/o HF and no prior arrhthymias. He notes mild exertional dyspnea with moderate physical activity but denies resting dyspnea. No chest pain. He denies palpitations.     Home Medications  Prior to Admission medications   Medication Sig Start Date End Date Taking? Authorizing Provider  amLODipine (NORVASC) 5 MG tablet  08/17/15  Yes Historical Provider, MD  docusate sodium (COLACE) 100 MG capsule Take 100 mg by mouth 2 (two) times daily.   Yes Historical Provider, MD  furosemide (LASIX) 20 MG tablet Take 40 mg by mouth daily.    Yes Historical Provider, MD  HYDROcodone-acetaminophen (NORCO/VICODIN) 5-325 MG tablet Take 1 tablet by mouth every 6 (six) hours as needed for severe pain. 09/23/15  Yes  TManon HildingKefalas, PA-C  lisinopril (PRINIVIL,ZESTRIL) 20 MG tablet Take 20 mg by mouth every morning.  06/27/15  Yes Historical Provider, MD  Omega-3 Fatty Acids (FISH OIL) 1000 MG CAPS Take 2 capsules by mouth daily.   Yes Historical Provider, MD  ondansetron (ZOFRAN) 8 MG tablet Take 1 tablet (8 mg total) by mouth every 8 (eight) hours as needed for nausea or vomiting. 03/15/15  Yes TManon HildingKefalas, PA-C  pregabalin (LYRICA) 75 MG capsule Take 75 mg by mouth daily.   Yes Historical Provider, MD  torsemide (DEMADEX) 20 MG tablet  09/02/15  Yes Historical Provider, MD    Family History  Family History  Problem Relation Age of Onset  . Cancer Sister   . Cancer Brother     Social History  Social History   Social History  . Marital Status: Divorced    Spouse Name: N/A  . Number of Children: N/A  . Years of Education: N/A   Occupational History  . Not on file.   Social History Main Topics  . Smoking status: Never Smoker   . Smokeless tobacco: Never Used  . Alcohol Use: No  . Drug  Use: No  . Sexual Activity: Not on file   Other Topics Concern  . Not on file   Social History Narrative     Review of Systems General:  No chills, fever, night sweats or weight changes.  Cardiovascular:  No chest pain, dyspnea on exertion, edema, orthopnea, palpitations, paroxysmal nocturnal dyspnea. Dermatological: No rash, lesions/masses Respiratory: No cough, dyspnea Urologic: No hematuria, dysuria Abdominal:   No nausea, vomiting, diarrhea, bright red blood per rectum, melena, or hematemesis Neurologic:  No visual changes, wkns, changes in mental status. All other systems reviewed and are otherwise negative except as noted above.  Physical Exam  Blood pressure 155/102, pulse 116, temperature 98 F (36.7 C), temperature source Oral, resp. rate 24, height 5' 11"  (1.803 m), weight 241 lb 8 oz (109.544 kg), SpO2 98 %.  General: Pleasant, NAD, moderately obese Psych: Normal affect. Neuro:  Alert and oriented X 3. Moves all extremities spontaneously. HEENT: Normal  Neck: Supple without bruits or JVD. Lungs:  Resp regular and unlabored, CTA. Heart: irregularly irregular no s3, s4, or murmurs. Abdomen: Soft, non-tender, non-distended, BS + x 4.  Extremities: DP/PT/Radials 2+ and equal bilaterally. 1+ bilateral pretibial edema. Venous stasis dermatitis.  Labs  Troponin [facility] Test)  Recent Labs  10/01/15 0135  TROPIPOC 0.11*    Recent Labs  10/01/15 0350 10/01/15 0835  TROPONINI 0.10* 0.11*   Lab Results  Component Value Date   WBC 3.5* 10/01/2015   HGB 8.5* 10/01/2015   HCT 25.0* 10/01/2015   MCV 118.8* 10/01/2015   PLT 24* 10/01/2015     Recent Labs Lab 10/01/15 0112 10/01/15 0138  NA 142 140  K 4.9 5.4*  CL 106 106  CO2 26  --   BUN 47* 60*  CREATININE 6.69* 6.70*  CALCIUM 10.0  --   GLUCOSE 122* 124*   Lab Results  Component Value Date   CHOL 166 03/03/2013   HDL 28* 03/03/2013   LDLCALC 90 03/03/2013   TRIG 238* 03/03/2013   No results found for: DDIMER   Radiology/Studies  Dg Chest 2 View  10/01/2015  CLINICAL DATA:  70 year old male, dialysis patient, with shortness of breath. EXAM: CHEST  2 VIEW COMPARISON:  Chest radiograph dated 07/14/2015 FINDINGS: Small bilateral pleural effusions appear increased compared to prior study. Bibasilar densities may represent atelectatic changes or pneumonia. No pneumothorax. There is stable cardiomegaly. Right pectoral central venous catheter with tip over central SVC. Right axillary vascular stent. There is osteopenia with degenerative changes of the spine. Lower thoracic vertebroplasty changes. IMPRESSION: Small bilateral pleural effusions as well as bibasilar atelectasis/ infiltrate, with interval progression compared to the prior study. Electronically Signed   By: Anner Crete M.D.   On: 10/01/2015 02:05   Ct Biopsy  09/18/2015  INDICATION: 70 year old male with a history of multiple  myeloma. He has undergone prior bone marrow transplantation. Repeat bone marrow biopsy requested to evaluate for recurrence. EXAM: CT GUIDED BONE MARROW ASPIRATION AND CORE BIOPSY Interventional Radiologist:  Criselda Peaches, MD MEDICATIONS: None. ANESTHESIA/SEDATION: Moderate (conscious) sedation was employed during this procedure. A total of 1.5 milligrams versed and 50 micrograms fentanyl were administered intravenously. The patient's level of consciousness and vital signs were monitored continuously by radiology nursing throughout the procedure under my direct supervision. Total monitored sedation time: 10 minutes FLUOROSCOPY TIME:  None COMPLICATIONS: None immediate. Estimated blood loss: <25 mL PROCEDURE: Informed written consent was obtained from the patient after a thorough discussion of the procedural risks, benefits  and alternatives. All questions were addressed. Maximal Sterile Barrier Technique was utilized including caps, mask, sterile gowns, sterile gloves, sterile drape, hand hygiene and skin antiseptic. A timeout was performed prior to the initiation of the procedure. The patient was positioned prone and non-contrast localization CT was performed of the pelvis to demonstrate the iliac marrow spaces. Maximal barrier sterile technique utilized including caps, mask, sterile gowns, sterile gloves, large sterile drape, hand hygiene, and betadine prep. Under sterile conditions and local anesthesia, an 11 gauge coaxial bone biopsy needle was advanced into the right iliac marrow space. Needle position was confirmed with CT imaging. Initially, bone marrow aspiration was performed. Next, the 11 gauge outer cannula was utilized to obtain a right iliac bone marrow core biopsy. Needle was removed. Hemostasis was obtained with compression. The patient tolerated the procedure well. Samples were prepared with the cytotechnologist. IMPRESSION: Technically successful CT-guided bone marrow biopsy. Electronically  Signed   By: Jacqulynn Cadet M.D.   On: 09/18/2015 11:37   Dg Bone Density  09/09/2015  EXAM: DUAL X-RAY ABSORPTIOMETRY (DXA) FOR BONE MINERAL DENSITY IMPRESSION: Ordering Physician:  Dr. Baird Cancer, Your patient Jarick Harkins completed a BMD test on 09/09/2015 using the [employer] (software version: 14.10) manufactured by [employer]. The following summarizes the results of our evaluation. PATIENT BIOGRAPHICAL: Name: FINNIGAN, WARRINER Patient ID: [id number] Birth Date: [birth year] Height: 70.0 in. Gender: Male Exam Date: 09/09/2015 Weight: 234.0 lbs. Indications: Caucasian, Height Loss, History of Fracture (Adult), Low Calcium Intake, Renal Fractures: Toe Treatments: DENSITOMETRY RESULTS: Site      Region     Measured Date Measured Age WHO Classification Young Adult T-score BMD         %Change vs. Previous Significant Change (*) [facility] Spine L1-L3 09/09/[age] N/A 2.1 1.468 g/cm2 - - DualFemur Neck Right 09/09/2015 [age] N[age]A -0.7 0.985 g/cm2 - - ASSESSMENT: This patient is considered normal by World Healh Organization (WHO) Criteria. (L-4 was excluded due to advanced degenerative changes.) World Health Organization [facility]) criteria for post-menopausal, Caucasian Women: Normal:       T-score at or above -1 SD Osteopenia:   T-score between -1 and -2.5 SD Osteoporosis: T-score at or below -2.5 SD RECOMMENDATIONS: [place] recommends that FDA-approved medial therapies be considered in postmenopausal women and men age 43 or older with a: 1. Hip or vertberal (clinical or morphometric) fracture. 2. T-Score of < -2.5 at the spine or hip. 3. Ten-year fracture probability by FRAX of 3% or greater for hip fracture or 20% or greater for major osteoporotic fracture. All treatment decisions require clinical judgment and consideration of indiviual patient factors, including patient preferences, co-morbidities, previous drug use, risk factors not captured in the FRAX model  (e.g. falls, vitamin D deficiency, increased bone turnover, interval significant decline in bone density) and possible under-or over-estimation of fracture risk by FRAX. All patients should ensure an adequate intake of dietary calcium (1200 mg/d) and vitamin D (800 IU daily) unless contraindicated. FOLLOW-UP: People with diagnosed cases of osteoporosis or osteopenia should be regularly tested for bone mineral density. For patients eligible for Medicare, routine testing is allowed once every 2 years. Testing frequency can be increased for patients who have rapidly progressing disease, or for those who are receiving medical therapy to restore bone mass. I have reviewed this report, and agree with the above findings. [facility] Electronically Signed   By: Earle Gell M.D.   On: 09/09/2015 11:18  ECG  Atrial flutter with 3:1 A-V conduction, CVR 77 bpm   Echocardiogram - 07/15/15  Study Conclusions  - Left ventricle: The cavity size was at the upper limits of  normal. Wall thickness was increased in a pattern of moderate  LVH. Systolic function was severely reduced. The estimated  ejection fraction was 25%. Diffuse hypokinesis. The study is not  technically sufficient to allow evaluation of LV diastolic  function. - Mitral valve: Calcified annulus. There was mild to moderate  regurgitation directed posteriorly. - Left atrium: The atrium was moderately to severely dilated. - Right ventricle: Systolic function was mildly reduced. - Right atrium: Central venous pressure (est): 3 mm Hg. - Tricuspid valve: There was trivial regurgitation. - Pulmonary arteries: PA peak pressure: 38 mm Hg (S). - Pericardium, extracardiac: There was no pericardial effusion.  Impressions:  - Images are limited. Upper normal LV chamber size with moderate  LVH and LVEF approximately 25%. There is diffuse hypokinesis,  most prominent in the anteroseptal wall. Indeterminate diastolic   function. Moderate to severe left atrial enlargement. MAC with  mild to moderate posteriorly directed mitral regurgitation.  Trivial tricuspid regurgitation. Estimated PASP 38 mmHg.    ASSESSMENT AND PLAN  Active Problems:   Essential hypertension, benign   ESRD on dialysis (HCC)   Elevated troponin   Hypoxia   Fluid overload   Atrial flutter with controlled response (HCC)   Systolic HF (heart failure) ([place])   1. New Onset Atrial Flutter: Rate is controlled in the 70s-80s. He is asymptomatic. Recommend addition of BB, either long acting metoprolol vs Coreg. Avoid use of Cardizem given LV dysfunction. Check TSH. Given anemia of chronic disease/ severe anemia and thrombocytopenia-now transfusion dependent, he is not a candidate for anticoagulation.   2. Systolic HF: EF by echo 10/2692 was reduced down to 25% with diffuse hypokinesis, compared to normal EFs in 2009 and 2015, previously 50-60%. Several potential etiologies may be responsible for new LV dysfunction, including chronic anemia, medication induced from chemotherapy agent vs tachy mediated from potential PAF vs ischemic etiology. He has undergone chemotherapy treatment with Revlimid/lenalidomide.  Per [employer], "In a scientific statement from the American Heart Association, lenalidomide has been determined to be an agent that may either cause direct myocardial toxicity or exacerbate underlying myocardial dysfunction". There is also a 3-7% risk for afib associated with its use. Given his multiple comorbidities, including severe anemia and thrombocytopenia-now transfusion dependent, he would not be a candidate for cardiac catheterization, as he would not be able to tolerate DAPT if PCI is needed, thus would argue against aggressive ischemic w/u. Recommend medical management. Despite CKD, he is now on lisinopril. Ok to continue ACE-I if ok with nephrology. Given concomitant atrial flutter, would recommend addition of a BB with either long  acting metoprolol vs Coreg. If hypotension becomes an issue, can d/c amlodipine to allow initiation/upward titration of BB.   3. Elevated Troponin: low level with flat trend, in the setting of CKD and atrial flutter. He denies CP. As outlined above, would not favor aggressive ischemic w/u given multiple co morbidities.   4. Hypertension: moderately elevated. Amlodipine, Lasix and lisinopril ordered. Consider adding BB given LV systolic dysfunction and atrial fib/flutter.     Signed, Lyda Jester, PA-C 10/01/2015, 12:58 PM   I have seen and examined the patient along with Lyda Jester, PA-C.  I have reviewed the chart, notes and new data.  I agree with PA's note.  Key new complaints: dyspnea at rest, orthopnea, edema "  for a while" Key examination changes: plethoric jugulars, S3 gallop, 2+ edema Key new findings / data: echo findings reviewed; ECG shows probably typical (counterclockwise right atrial) flutter; plt 24K, anemia.  PLAN: Needs volume removal for acute HF, will have dialysis today. Management of flutter will be very challenging since anticoagulation appears to be perilous. Agree that beta blockers may be a good longer term choice, but would not initiate while still acutely decompensated. Also agree that workup for CAD will be of little value. He would not tolerate antiplatelet agents. He has been off Revlimid for about 3 months. It may be too risky, but can consider flutter ablation following platelet transfusion. Will discuss with EP and Nephrology.  Sanda Klein, MD, [place] [phone] 10/01/2015, 2:53 PM

## 2015-10-01 NOTE — Progress Notes (Addendum)
Patient seen and examined  70 y.o. 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 Patient states he is unsure but may have accidentally taken his blood pressure medications (lisinopril 62m and amlodipine 5 mg) twice last evening.  Assessment and plan Hypoxic respiratory failure Patient found to be tachypneic and tachycardic on exam oxygen saturation less than 90%. Chest x-ray shows small bilateral pleural effusions Patient also has evidence of chronic systolic heart failure with EF of 25% on 2-D echo done on 07/15/15 This is compounded by anemia secondary to multiple myeloma Now has  new onset atrial flutter, cycle  troponins , likely not a candidate for anticoagulation secondary to severe anemia and thrombocytopenia  [place] Saturday  Hypertension-continue with Norvasc, lisinopril  Multiple myeloma/anemia of chronic disease-severe anemia and thrombocytopenia-now transfusion dependent patient underwent CT-guided  Bone marrow biopsy  [place] REVLIMID 5 MG capsule

## 2015-10-01 NOTE — ED Provider Notes (Signed)
CSN: [id number]     Arrival date & time 10/01/15  4076 History  By signing my name below, I, Altamease Oiler, attest that this documentation has been prepared under the direction and in the presence of Everlene Balls, MD. Electronically Signed: Altamease Oiler, ED Scribe. 10/01/2015. 2:24 AM   Chief Complaint  Patient presents with  . Hypertension   The history is provided by the patient. No language interpreter was used.   Brought in by EMS, PSALM SCHAPPELL is a 70 y.o. male with PMHx of ESRD on dialysis and HTN who presents to the Emergency Department for evaluation of suspected overdose. The pt cannot remember if he took his blood pressure medication (lisinopril 20 mg and amlodipine 5 mg) twice last evening. Pt denies chest or other pain,SOB, fever, and cough. He does not use oxygen at home but reports feeling better with it on tonight.  His dialysis is on T/TH/S at [facility] on [address] in [place].   Past Medical History  Diagnosis Date  . Hypertension   . Ruptured cervical disc   . Fall resulting in striking against other object     paralyzed  . Multiple myeloma   . Recurrent multiple myeloma of bone marrow with unknown EBV status ([place])   . Multiple myeloma ([place]) 01/26/2011  . Unspecified vitamin D deficiency 03/06/2013  . Anemia of chronic disease 12/30/2013  . Constipation   . Dialysis patient ([place]) 2016  . Thrombocytopenia ([place]) 08/31/2014  . 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 Constipation    Past Surgical History  Procedure Laterality Date  . Inguinal hernia repair Bilateral   . Anterior cervical decomp/discectomy fusion    . Bone marrow aspirate and biopsy wiith lumbar puncture    . Melanoma excision      rt. breast  . Cervical spine surgery    . Kyphoplasty Bilateral 01/30/2014    Procedure: Thoracic eleven Kyphoplasty;  Surgeon: Consuella Lose, MD;   [facility];  Service: Neurosurgery;  Laterality: Bilateral;  Thoracic eleven Kyphoplasty  . Av fistula placement Right 04/30/2014    Procedure: BRACHIOCEPHALIC ARTERIOVENOUS (AV) FISTULA CREATION;  Surgeon: Conrad Conrad, MD;  Location: [facility];  Service: Vascular;  Laterality: Right;  . Portacath placement Left 02/18/2015    Procedure: INSERTION OF PORT A CATH RIGHT INTERNAL JUGULAR VEIN ;  Surgeon: Conrad Thorndale, MD;  Location: [facility];  Service: Vascular;  Laterality: Left;  . Cataract extraction w/phaco Right 04/15/2015    Procedure: CATARACT EXTRACTION PHACO AND INTRAOCULAR LENS PLACEMENT (IOC);  Surgeon: Tonny Branch, MD;  [facility];  Service: Ophthalmology;  Laterality: Right;  CDE:8.97  . Cataract extraction w/phaco Left 04/29/2015    Procedure: CATARACT EXTRACTION PHACO AND INTRAOCULAR LENS PLACEMENT ; CDE:  7.61;  Surgeon: Tonny Branch, MD;  [facility];  Service: Ophthalmology;  Laterality: Left;   Family History  Problem Relation Age of Onset  . Cancer Sister   . Cancer Brother    Social History  Substance Use Topics  . Smoking status: Never Smoker   . Smokeless tobacco: Never Used  . Alcohol Use: No    Review of Systems  10 Systems reviewed and all are negative for acute change except as noted in the HPI.  Allergies  Pamidronate  Home Medications   Prior to Admission medications   Medication Sig Start Date End Date  Taking? Authorizing Provider  amLODipine (NORVASC) 5 MG tablet  08/17/15  Yes Historical Provider, MD  docusate sodium (COLACE) 100 MG capsule Take 100 mg by mouth 2 (two) times daily.   Yes Historical Provider, MD  furosemide (LASIX) 20 MG tablet Take 40 mg by mouth daily.    Yes Historical Provider, MD  HYDROcodone-acetaminophen (NORCO/VICODIN) 5-325 MG tablet Take 1 tablet by mouth every 6 (six) hours as needed for severe pain. 09/23/15  Yes Manon Hilding Kefalas, PA-C  lisinopril (PRINIVIL,ZESTRIL) 20 MG tablet Take 20 mg by mouth every morning.   06/27/15  Yes Historical Provider, MD  Omega-3 Fatty Acids (FISH OIL) 1000 MG CAPS Take 2 capsules by mouth daily.   Yes Historical Provider, MD  ondansetron (ZOFRAN) 8 MG tablet Take 1 tablet (8 mg total) by mouth every 8 (eight) hours as needed for nausea or vomiting. 03/15/15  Yes Manon Hilding Kefalas, PA-C  pregabalin (LYRICA) 75 MG capsule Take 75 mg by mouth daily.   Yes Historical Provider, MD  torsemide (DEMADEX) 20 MG tablet  09/02/15  Yes Historical Provider, MD   BP 158/116 mmHg  Pulse 117  Temp(Src) 98.5 F (36.9 C) (Oral)  Resp 22  Ht 5' 11"  (1.803 m)  Wt 236 lb (107.049 kg)  BMI 32.93 kg/m2  SpO2 97% Physical Exam  Constitutional: He is oriented to person, place, and time. Vital signs are normal. He appears well-developed and well-nourished.  Non-toxic appearance. He does not appear ill. No distress.  HENT:  Head: Normocephalic and atraumatic.  Nose: Nose normal.  Mouth/Throat: Oropharynx is clear and moist. No oropharyngeal exudate.  Eyes: Conjunctivae and EOM are normal. Pupils are equal, round, and reactive to light. No scleral icterus.  Neck: Normal range of motion. Neck supple. No tracheal deviation, no edema, no erythema and normal range of motion present. No thyroid mass and no thyromegaly present.  Cardiovascular: Regular rhythm, S1 normal, S2 normal, normal heart sounds, intact distal pulses and normal pulses.  Tachycardia present.  Exam reveals no gallop and no friction rub.   No murmur heard. Pulmonary/Chest: Effort normal and breath sounds normal. No respiratory distress. He has no wheezes. He has no rhonchi. He has no rales.  Port in right chest wall   Abdominal: Soft. Normal appearance and bowel sounds are normal. He exhibits no distension, no ascites and no mass. There is no hepatosplenomegaly. There is no tenderness. There is no rebound, no guarding and no CVA tenderness.  Musculoskeletal: Normal range of motion. He exhibits edema. He exhibits no tenderness.  BLE  edema RUE AV fistula with palpable thrill   Lymphadenopathy:    He has no cervical adenopathy.  Neurological: He is alert and oriented to person, place, and time. He has normal strength. No cranial nerve deficit or sensory deficit.  Skin: Skin is warm, dry and intact. No petechiae and no rash noted. He is not diaphoretic. No erythema. No pallor.  Psychiatric: He has a normal mood and affect. His behavior is normal. Judgment normal.  Nursing note and vitals reviewed.   ED Course  Procedures (including critical care time) DIAGNOSTIC STUDIES: Oxygen Saturation is 97% on 2L, adequate by my interpretation.    COORDINATION OF CARE: 1:07 AM Discussed treatment plan which includes lab work, CXR, EKG with pt at bedside and pt agreed to plan.  2:14 AM Consult complete with Nephrology. Patient case explained and discussed.   2:20 AM-Consult complete with Dr. Alcario Drought (Hospitalist). Patient case explained and discussed. Agrees to admit  patient for further evaluation and treatment. Call ended at 2:22 AM  Labs Review Labs Reviewed  CBC WITH DIFFERENTIAL/PLATELET - Abnormal; Notable for the following:    WBC 3.5 (*)    RBC 1.97 (*)    Hemoglobin 7.5 (*)    HCT 23.4 (*)    MCV 118.8 (*)    MCH 38.1 (*)    RDW 24.5 (*)    Platelets 24 (*)    All other components within normal limits  BASIC METABOLIC PANEL - Abnormal; Notable for the following:    Glucose, Bld 122 (*)    BUN 47 (*)    Creatinine, Ser 6.69 (*)    GFR calc non Af Amer 8 (*)    GFR calc Af Amer 9 (*)    All other components within normal limits  I-STAT CHEM 8, ED - Abnormal; Notable for the following:    Potassium 5.4 (*)    BUN 60 (*)    Creatinine, Ser 6.70 (*)    Glucose, Bld 124 (*)    Hemoglobin 8.5 (*)    HCT 25.0 (*)    All other components within normal limits  I-STAT TROPOININ, ED - Abnormal; Notable for the following:    Troponin i, poc 0.11 (*)    All other components within normal limits    Imaging  Review Dg Chest 2 View  10/01/2015  CLINICAL DATA:  70 year old male, dialysis patient, with shortness of breath. EXAM: CHEST  2 VIEW COMPARISON:  Chest radiograph dated 07/14/2015 FINDINGS: Small bilateral pleural effusions appear increased compared to prior study. Bibasilar densities may represent atelectatic changes or pneumonia. No pneumothorax. There is stable cardiomegaly. Right pectoral central venous catheter with tip over central SVC. Right axillary vascular stent. There is osteopenia with degenerative changes of the spine. Lower thoracic vertebroplasty changes. IMPRESSION: Small bilateral pleural effusions as well as bibasilar atelectasis/ infiltrate, with interval progression compared to the prior study. Electronically Signed   By: Anner Crete M.D.   On: 10/01/2015 02:05   I have personally reviewed and evaluated these images and lab results as part of my medical decision-making.   EKG Interpretation   Date/Time:  Tuesday Oct 01 2015 00:59:51 EDT Ventricular Rate:  117 PR Interval:  155 QRS Duration: 115 QT Interval:  350 QTC Calculation: 488 R Axis:   -32 Text Interpretation:  Ectopic atrial tachycardia, unifocal Nonspecific  intraventricular conduction delay No significant change since last tracing  Confirmed by Glynn Octave [id number]) on 10/01/2015 1:23:11 AM      MDM   Final diagnoses:  None   Patient presents to the ED for taking his medications twice.  On exam however, he is tachypneic and tachycardic.  On RA, his O2 sat dropped to as low as 90% and I put [place] back on.  He states he feels better with O2, even though he says he is not short of breath.  I spoke with nephrology on call who recs to admit the patient and they will see him in the morning.  Dr. Alcario Drought accepts the patient for admission.    I personally performed the services described in this documentation, which was scribed in my presence. The recorded information has been reviewed and is accurate.      Everlene Balls, MD 10/01/15 [phone]

## 2015-10-01 NOTE — Progress Notes (Signed)
Patient returning from dialysis, this RN was called to room where I found Sharrie Rothman RN, holding pressure to patient's right arm in attempt to stop a bleed from fistula site. Upon close inspection, I found pressure was not being held at the bleeding site. I elevated patient's arm and held pressure for 5 minutes, applied pressure dressing, observed no signs of bleeding reassured patient and elevated arm with pillows. Will continue to monitor.

## 2015-10-01 NOTE — Consult Note (Signed)
Reason for Consult: Atrial flutter  Referring Physician: Dr. [name] is an 70 y.o. male.   HPI: The patient is a 70 yo man with a h/o multiple myeloma, HTN, peripheral vascular disease, and thrombocytopenia who was admitted with worsening CHF and found to have atrial flutter with a RVR/ CVR. He has been treated with HD for his volume overload with improvement. The patient does not feel palpitations. He notes that he has felt worse the past couple of months.  He was in rhythm back in October but was out of rhythm since February. He does not have palpitations.  PMH: Past Medical History  Diagnosis Date  . Hypertension   . Ruptured cervical disc   . Fall resulting in striking against other object     paralyzed  . Multiple myeloma   . Recurrent multiple myeloma of bone marrow with unknown EBV status ([name])   . Multiple myeloma ([place]) 01/26/2011  . Unspecified vitamin D deficiency 03/06/2013  . Anemia of chronic disease 12/30/2013  . Constipation   . Dialysis patient ([place]) 2016  . Thrombocytopenia ([place]) 08/31/2014  . 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 Constipation   . Hypoxia 09/2015    PSHX: Past Surgical History  Procedure Laterality Date  . Inguinal hernia repair Bilateral   . Anterior cervical decomp/discectomy fusion    . Bone marrow aspirate and biopsy wiith lumbar puncture    . Melanoma excision      rt. breast  . Cervical spine surgery    . Kyphoplasty Bilateral 01/30/2014    Procedure: Thoracic eleven Kyphoplasty;  Surgeon: Consuella Lose, MD;  [facility];  Service: Neurosurgery;  Laterality: Bilateral;  Thoracic eleven Kyphoplasty  . Av fistula placement Right 04/30/2014    Procedure: BRACHIOCEPHALIC ARTERIOVENOUS (AV) FISTULA CREATION;  Surgeon: Conrad [place], MD;  Location: [facility];  Service: Vascular;  Laterality: Right;  .  Portacath placement Left 02/18/2015    Procedure: INSERTION OF PORT A CATH RIGHT INTERNAL JUGULAR VEIN ;  Surgeon: Conrad Emery, MD;  Location: [facility];  Service: Vascular;  Laterality: Left;  . Cataract extraction w/phaco Right 04/15/2015    Procedure: CATARACT EXTRACTION PHACO AND INTRAOCULAR LENS PLACEMENT (IOC);  Surgeon: Tonny Branch, MD;  Location[facility];  Service: Ophthalmology;  Laterality: Right;  CDE:8.97  . Cataract extraction w/phaco Left 04/29/2015    Procedure: CATARACT EXTRACTION PHACO AND INTRAOCULAR LENS PLACEMENT ; CDE:  7.61;  Surgeon: Tonny Branch, MD;  [facility];  Service: Ophthalmology;  Laterality: Left;    FAMHX: Family History  Problem Relation Age of Onset  . Cancer Sister   . Cancer Brother     Social History:  reports that he has never smoked. He has never used smokeless tobacco. He reports that he does not drink alcohol or use illicit drugs.  Allergies:  Allergies  Allergen Reactions  . Pamidronate Anaphylaxis    blood pressure     Medications: I have reviewed the patient's current medications.  Dg Chest 2 View  10/01/2015  CLINICAL DATA:  70 year old male, dialysis patient, with shortness of breath. EXAM: CHEST  2 VIEW COMPARISON:  Chest radiograph dated 07/14/2015 FINDINGS: Small bilateral pleural effusions appear increased compared to prior study. Bibasilar densities may represent atelectatic changes or pneumonia. No pneumothorax. There is stable cardiomegaly. Right pectoral central venous catheter with tip over  central SVC. Right axillary vascular stent. There is osteopenia with degenerative changes of the spine. Lower thoracic vertebroplasty changes. IMPRESSION: Small bilateral pleural effusions as well as bibasilar atelectasis/ infiltrate, with interval progression compared to the prior study. Electronically Signed   By: Anner Crete M.D.   On: 10/01/2015 02:05    ROS  As stated in the HPI and negative for all other systems.  Physical  Exam  Vitals:Blood pressure 146/101, pulse 91, temperature 98.1 F (36.7 C), temperature source Oral, resp. rate 24, height _0  (1.803 m), weight 241 lb 10 oz (109.6 kg), SpO2 98 %.  Chronically ill appearing man, NAD HEENT: Unremarkable Neck:  6 cm JVD, no thyromegally Lymphatics:  No adenopathy Back:  No CVA tenderness Lungs:  Basilar rales but no wheezes HEART:  Regular tachy rhythm, no murmurs, no rubs, no clicks Abd:  Flat, positive bowel sounds, no organomegally, no rebound, no guarding Ext:  2 plus pulses, no edema, no cyanosis, no clubbing Skin:  No rashes no nodules Neuro:  CN II through XII intact, motor grossly intact   ECG - atrial flutter with a CVR/RVR Tele - atrial flutter with a CVR/RVR  Echo - reviewed. New onset LV dysfunction  Assessment/Plan: 1. Atrial fltutter - I have discussed the treatment with the patient and included DCCV and catheter ablation. With his thrombocytopenia, long term anti-coagulation is contra-indicated. We will hopefully be able to do a TEE and ablate his flutter and then plan 2 weeks of anti-coagulation. 2. Chronic systolic heart failure - his time course might suggest the he has developed a tachy induced CM. In addition to rate control, he will a beta blocker and possible ARB/ACE 3. Hypertensive heart disease - will adjust medications as needed.  Gregory Overlie TaylorMD 10/01/2015, 4:19 PM

## 2015-10-01 NOTE — ED Notes (Signed)
Pt comes from home via [facility], pt was unsure if he took an extra dose of his BP medication today, upon EMS arrival BP was inially reading 0000000 systolic. EKG was showing abnormalities, pt has no cardiac hx.

## 2015-10-01 NOTE — Consult Note (Signed)
[name] 10/01/2015 [name] Requesting Physician:  Allyson Sabal  Reason for Consult:  ESRD, SOB HPI:  52M seen at the request of Dr. Allyson Sabal for the evaluation of shortness of breath in a dialysis patient. Patient's pertinent history includes ESRD receiving center based hemodialysis on Tuesdays, Thursdays, Saturdays at [facility] in [place].  He also has multiple myeloma, hypertension. He presented to the emergency room last night with concerns that he had taken twice as much of his lisinopril and amlodipine as prescribed -- but he now thinks that he took only the prescribed dose. He was found to have some dyspnea improved with oxygen and a two-view chest x-ray demonstrated bilateral effusions. Labs demonstrate a potassium of 4.9, bicarbonate of 26. Hemodynamics have been stable. He is receiving therapy for his myeloma and has pancytopenia including anemia and thrombocytopenia.  He dialyzes using a right arm AV fistula. He states his last treatment was on Saturday, uneventful. I do not have treatment records currently available.    Filed Weights   10/01/15 0059 10/01/15 0403  Weight: 107.049 kg (236 lb) 109.544 kg (241 lb 8 oz)       ROS Balance of 12 systems is negative w/ exceptions as above  Outpt HD Orders Unit: [facility] Days: [facility] Other records requested  PMH  Past Medical History  Diagnosis Date  . Hypertension   . Ruptured cervical disc   . Fall resulting in striking against other object     paralyzed  . Multiple myeloma   . Recurrent multiple myeloma of bone marrow with unknown EBV status ([place])   . Multiple myeloma ([place]) 01/26/2011  . Unspecified vitamin D deficiency 03/06/2013  . Anemia of chronic disease 12/30/2013  . Constipation   . Dialysis patient ([place]) 2016  . Thrombocytopenia ([place]) 08/31/2014  . 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  Constipation   . Hypoxia 09/2015   PSH  Past Surgical History  Procedure Laterality Date  . Inguinal hernia repair Bilateral   . Anterior cervical decomp/discectomy fusion    . Bone marrow aspirate and biopsy wiith lumbar puncture    . Melanoma excision      rt. breast  . Cervical spine surgery    . Kyphoplasty Bilateral 01/30/2014    Procedure: Thoracic eleven Kyphoplasty;  Surgeon: Consuella Lose, MD;  [facility];  Service: Neurosurgery;  Laterality: Bilateral;  Thoracic eleven Kyphoplasty  . Av fistula placement Right 04/30/2014    Procedure: BRACHIOCEPHALIC ARTERIOVENOUS (AV) FISTULA CREATION;  Surgeon: Conrad Renick, MD;  Location: [facility];  Service: Vascular;  Laterality: Right;  . Portacath placement Left 02/18/2015    Procedure: INSERTION OF PORT A CATH RIGHT INTERNAL JUGULAR VEIN ;  Surgeon: Conrad [place], MD;  Location: [facility];  Service: Vascular;  Laterality: Left;  . Cataract extraction w/phaco Right 04/15/2015    Procedure: CATARACT EXTRACTION PHACO AND INTRAOCULAR LENS PLACEMENT (IOC);  Surgeon: Tonny Branch, MD;  [facility];  Service: Ophthalmology;  Laterality: Right;  CDE:8.97  . Cataract extraction w/phaco Left 04/29/2015    Procedure: CATARACT EXTRACTION PHACO AND INTRAOCULAR LENS PLACEMENT ; CDE:  7.61;  Surgeon: Tonny Branch, MD;  [facility];  Service: Ophthalmology;  Laterality: Left;   FH  Family History  Problem Relation Age of Onset  . Cancer Sister   . Cancer Brother    SH  reports  that he has never smoked. He has never used smokeless tobacco. He reports that he does not drink alcohol or use illicit drugs. Allergies  Allergies  Allergen Reactions  . Pamidronate Anaphylaxis    blood pressure    Home medications Prior to Admission medications   Medication Sig Start Date End Date Taking? Authorizing Provider  amLODipine (NORVASC) 5 MG tablet  08/17/15  Yes Historical Provider, MD  docusate sodium (COLACE) 100 MG capsule Take 100 mg by mouth  2 (two) times daily.   Yes Historical Provider, MD  furosemide (LASIX) 20 MG tablet Take 40 mg by mouth daily.    Yes Historical Provider, MD  HYDROcodone-acetaminophen (NORCO/VICODIN) 5-325 MG tablet Take 1 tablet by mouth every 6 (six) hours as needed for severe pain. 09/23/15  Yes Manon Hilding Kefalas, PA-C  lisinopril (PRINIVIL,ZESTRIL) 20 MG tablet Take 20 mg by mouth every morning.  06/27/15  Yes Historical Provider, MD  Omega-3 Fatty Acids (FISH OIL) 1000 MG CAPS Take 2 capsules by mouth daily.   Yes Historical Provider, MD  ondansetron (ZOFRAN) 8 MG tablet Take 1 tablet (8 mg total) by mouth every 8 (eight) hours as needed for nausea or vomiting. 03/15/15  Yes Manon Hilding Kefalas, PA-C  pregabalin (LYRICA) 75 MG capsule Take 75 mg by mouth daily.   Yes Historical Provider, MD  torsemide (DEMADEX) 20 MG tablet  09/02/15  Yes Historical Provider, MD    Current Medications Scheduled Meds: . amLODipine  5 mg Oral QHS  . calcium acetate  1,334 mg Oral TID WC  . docusate sodium  100 mg Oral BID  . furosemide  40 mg Oral Daily  . lisinopril  20 mg Oral QHS  . pregabalin  75 mg Oral Daily   Continuous Infusions:  PRN Meds:.HYDROcodone-acetaminophen, ondansetron, sodium chloride flush  CBC  Recent Labs Lab 09/26/15 1357 10/01/15 0112 10/01/15 0138  WBC 2.7* 3.5*  --   NEUTROABS  --  2.3  --   HGB 8.4* 7.5* 8.5*  HCT 25.0* 23.4* 25.0*  MCV 115.7* 118.8*  --   PLT 17* 24*  --    Basic Metabolic Panel  Recent Labs Lab 10/01/15 0112 10/01/15 0138  NA 142 140  K 4.9 5.4*  CL 106 106  CO2 26  --   GLUCOSE 122* 124*  BUN 47* 60*  CREATININE 6.69* 6.70*  CALCIUM 10.0  --     Physical Exam  Blood pressure 155/102, pulse 116, temperature 98 F (36.7 C), temperature source Oral, resp. rate 24, height _0  (1.803 m), weight 109.544 kg (241 lb 8 oz), SpO2 98 %. GEN: NAD ENT: NCAT EYES: EOMI CV: RRR PULM: Diminished in bases ABD: s/tn/nd, obese SKIN: some petechiae in LEs EXT:2+  Edema   A 1. ESRD:  1. [facility], due today 2. SOB/Pleural Effusions/Hypervolemia 3. ? Overdose on lisinopril and amlopdine, BP stable 4. Anemia in setting of tx for myeloma 5. Multiple Myeloma  P 1. HD today 2. OK for discharge afterwards to resuem outpt HD 3. Will need serial probing of EDW as outpt 4. Request outpt records  Pearson Grippe MD 10/01/2015, 11:45 AM

## 2015-10-02 DIAGNOSIS — C9001 Multiple myeloma in remission: Secondary | ICD-10-CM

## 2015-10-02 DIAGNOSIS — N186 End stage renal disease: Secondary | ICD-10-CM

## 2015-10-02 DIAGNOSIS — I4892 Unspecified atrial flutter: Secondary | ICD-10-CM

## 2015-10-02 DIAGNOSIS — I132 Hypertensive heart and chronic kidney disease with heart failure and with stage 5 chronic kidney disease, or end stage renal disease: Secondary | ICD-10-CM | POA: Diagnosis not present

## 2015-10-02 DIAGNOSIS — D696 Thrombocytopenia, unspecified: Secondary | ICD-10-CM | POA: Diagnosis not present

## 2015-10-02 DIAGNOSIS — D469 Myelodysplastic syndrome, unspecified: Secondary | ICD-10-CM

## 2015-10-02 DIAGNOSIS — I10 Essential (primary) hypertension: Secondary | ICD-10-CM | POA: Diagnosis not present

## 2015-10-02 DIAGNOSIS — R7989 Other specified abnormal findings of blood chemistry: Secondary | ICD-10-CM

## 2015-10-02 DIAGNOSIS — I5023 Acute on chronic systolic (congestive) heart failure: Secondary | ICD-10-CM | POA: Diagnosis not present

## 2015-10-02 DIAGNOSIS — Z992 Dependence on renal dialysis: Secondary | ICD-10-CM

## 2015-10-02 DIAGNOSIS — Z9484 Stem cells transplant status: Secondary | ICD-10-CM

## 2015-10-02 LAB — CBC
HEMATOCRIT: 22.1 % — AB (ref 39.0–52.0)
HEMOGLOBIN: 7.2 g/dL — AB (ref 13.0–17.0)
MCH: 38.1 pg — ABNORMAL HIGH (ref 26.0–34.0)
MCHC: 32.6 g/dL (ref 30.0–36.0)
MCV: 116.9 fL — ABNORMAL HIGH (ref 78.0–100.0)
Platelets: 12 10*3/uL — CL (ref 150–400)
RBC: 1.89 MIL/uL — AB (ref 4.22–5.81)
RDW: 23.4 % — ABNORMAL HIGH (ref 11.5–15.5)
WBC: 3.5 10*3/uL — AB (ref 4.0–10.5)

## 2015-10-02 LAB — PREPARE RBC (CROSSMATCH)

## 2015-10-02 LAB — COMPREHENSIVE METABOLIC PANEL
ALBUMIN: 3.1 g/dL — AB (ref 3.5–5.0)
ALT: 13 U/L — ABNORMAL LOW (ref 17–63)
ANION GAP: 10 (ref 5–15)
AST: 15 U/L (ref 15–41)
Alkaline Phosphatase: 45 U/L (ref 38–126)
BILIRUBIN TOTAL: 1.2 mg/dL (ref 0.3–1.2)
BUN: 30 mg/dL — ABNORMAL HIGH (ref 6–20)
CALCIUM: 8.9 mg/dL (ref 8.9–10.3)
CO2: 29 mmol/L (ref 22–32)
Chloride: 99 mmol/L — ABNORMAL LOW (ref 101–111)
Creatinine, Ser: 4.5 mg/dL — ABNORMAL HIGH (ref 0.61–1.24)
GFR calc non Af Amer: 12 mL/min — ABNORMAL LOW (ref >60)
GFR, EST AFRICAN AMERICAN: 14 mL/min — AB (ref >60)
Glucose, Bld: 90 mg/dL (ref 65–99)
POTASSIUM: 4.1 mmol/L (ref 3.5–5.1)
Sodium: 138 mmol/L (ref 135–145)
TOTAL PROTEIN: 6.1 g/dL — AB (ref 6.5–8.1)

## 2015-10-02 MED ORDER — SODIUM CHLORIDE 0.9 % IV SOLN: Freq: Once | INTRAVENOUS | Status: DC

## 2015-10-02 MED ORDER — ACETAMINOPHEN 325 MG PO TABS
650.0000 mg | ORAL_TABLET | Freq: Once | ORAL | Status: AC
  Administered 2015-10-02: 650 mg via ORAL
  Filled 2015-10-02: qty 2

## 2015-10-02 NOTE — Progress Notes (Signed)
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    OBJECTIVE Filed Vitals:   10/01/15 1920 10/01/15 1922 10/01/15 2259 10/02/15 0443  BP: 137/89 132/89 111/70 126/78  Pulse: 99 88 108 79  Temp: 98 F (36.7 C)  98.1 F (36.7 C) 98.2 F (36.8 C)  TempSrc:   Oral Oral  Resp: _0 Height:      Weight: 235 lb 14.3 oz (107 kg)   232 lb 12.9 oz (105.6 kg)  SpO2:   96% 100%    Intake/Output Summary (Last 24 hours) at 10/02/15 0742 Last data filed at 10/02/15 0230  Gross per 24 hour  Intake    240 ml  Output   2852 ml  Net  -2612 ml   Filed Weights   10/01/15 1540 10/01/15 1920 10/02/15 0443  Weight: 241 lb 10 oz (109.6 kg) 235 lb 14.3 oz (107 kg) 232 lb 12.9 oz (105.6 kg)    PHYSICAL EXAM General: Well developed, well nourished, male in no acute distress. Head: Normocephalic, atraumatic.  Neck: Supple without bruits,no JVD. Lungs:  Resp regular and unlabored, CTA, diminished in bases.  Heart: IRRR, S1, S2, No murmur; no rub. Abdomen: Soft, non-tender, non-distended, BS + x 4.  Extremities: No clubbing, cyanosis, +1 BLE edema.  Neuro: Alert and oriented X 3. Moves all extremities  spontaneously. Psych: Normal affect.  LABS: CBC: Recent Labs  10/01/15 0112  10/01/15 1704 10/02/15 0447  WBC 3.5*  --  3.7* 3.5*  NEUTROABS 2.3  --   --   --   HGB 7.5*  < > 7.4* 7.2*  HCT 23.4*  < > 23.3* 22.1*  MCV 118.8*  --  119.5* 116.9*  PLT 24*  --  21* 12*  < > = values in this interval not displayed. INR:No results for input(s): INR in the last 72 hours. Basic Metabolic Panel: Recent Labs  10/01/15 1705 10/02/15 0447  NA 141 138  K 5.0 4.1  CL 105 99*  CO2 25 29  GLUCOSE 137* 90  BUN 55* 30*  CREATININE 7.41* 4.50*  CALCIUM 9.8 8.9  PHOS 5.1*  --    Liver Function Tests: Recent Labs  10/01/15 1705 10/02/15 0447  AST  --  15  ALT  --  13*  ALKPHOS  --  45  BILITOT  --  1.2  PROT  --  6.1*  ALBUMIN 3.4* 3.1*   Cardiac Enzymes: Recent Labs  10/01/15 0350 10/01/15 0835 10/01/15 1554  TROPONINI 0.10* 0.11* 0.11*    Recent Labs  10/01/15 0135  TROPIPOC 0.11*    Current facility-administered medications:  .  amLODipine (NORVASC) tablet 5 mg, 5 mg, Oral, QHS, Jared M Gardner, DO, 5 mg at 10/01/15 2115 .  calcium acetate (PHOSLO) capsule 2,001 mg, 2,001 mg, Oral, TID WC, Reyne Dumas, MD, 2,001 mg at 10/02/15 0636 .  docusate sodium (COLACE) capsule 100 mg, 100 mg, Oral, BID, Jared M Gardner, DO, 100 mg at 10/01/15 1003 .  furosemide (LASIX) tablet 40 mg, 40 mg, Oral, Daily, Etta Quill, DO, 40 mg at 10/01/15 1002 .  HYDROcodone-acetaminophen (NORCO/VICODIN) 5-325 MG per tablet 1 tablet, 1 tablet, Oral, Q6H PRN, Etta Quill, DO, 1 tablet at 10/02/15 0444 .  lisinopril (PRINIVIL,ZESTRIL) tablet 20 mg, 20 mg, Oral, QHS, Etta Quill, DO, 20 mg at 10/01/15 2114 .  ondansetron (ZOFRAN) tablet 8 mg, 8 mg, Oral, Q8H PRN, Etta Quill, DO .  pregabalin (LYRICA) capsule 75 mg, 75 mg, Oral, Daily, Etta Quill, DO, 75 mg at 10/01/15 1002 .  sodium chloride flush (NS) 0.9 % injection 10-40 mL, 10-40 mL, Intracatheter, PRN, Reyne Dumas,  MD    TELE: Aflutter 80-90's  ECG: Aflutter  Radiology/Studies: Dg Chest 2 View  10/01/2015  CLINICAL DATA:  70 year old male, dialysis patient, with shortness of breath. EXAM: CHEST  2 VIEW COMPARISON:  Chest radiograph dated 07/14/2015 FINDINGS: Small bilateral pleural effusions appear increased compared to prior study. Bibasilar densities may represent atelectatic changes or pneumonia. No pneumothorax. There is stable cardiomegaly. Right pectoral central venous catheter with tip over central SVC. Right axillary vascular stent. There is osteopenia with degenerative changes of the spine. Lower thoracic vertebroplasty changes. IMPRESSION: Small bilateral pleural effusions as well as bibasilar atelectasis/ infiltrate, with interval progression compared to the prior study. Electronically Signed   By: Anner Crete M.D.   On: 10/01/2015 02:05     Current Medications:  . amLODipine  5 mg Oral QHS  . calcium acetate  2,001 mg Oral TID WC  . docusate sodium  100 mg Oral BID  . furosemide  40 mg Oral Daily  . lisinopril  20 mg Oral QHS  . pregabalin  75 mg Oral Daily      ASSESSMENT AND PLAN: Active Problems:   Essential hypertension, benign   ESRD on dialysis ([facility])   Elevated troponin   Hypoxia   Fluid overload   Atrial flutter with controlled response ([facility])   Systolic HF (heart failure) ([place])  1. New Onset Atrial Flutter: Rate is controlled in the 80-90s. He is asymptomatic. Given anemia of chronic disease/ severe anemia and thrombocytopenia-now transfusion dependent, he is not a candidate for anticoagulation. EP recommends TEE and catheter ablation this admission. He will need 2 weeks of anticoagulation after procedure.   2. Systolic HF: EF by echo 12/5883 was reduced down to 25% with diffuse hypokinesis, compared to normal EFs in 2009 and 2015, previously 50-60%. Several potential etiologies may be responsible for new LV dysfunction, including chronic anemia, medication induced  from chemotherapy agent vs tachy mediated from potential PAF vs ischemic etiology. Given his multiple comorbidities, including severe anemia and thrombocytopenia-now transfusion dependent, he would not be a candidate for cardiac catheterization, as he would not be able to tolerate DAPT if PCI is needed. Despite CKD, he is now on lisinopril. Will need beta blocker once he is no longer acutely decompensated.   3. Elevated Troponin: low level with flat trend, in the setting of CKD and atrial flutter. He denies CP. As outlined above, would not favor aggressive ischemic w/u given multiple co morbidities.   4. Hypertension: BP is labile, pt is on ACEI and amlodipine now.   5. ESRD on HD: Nephrology following,  pt had HD yesterday.    Signed, Arbutus Leas , NP 7:42 AM 10/02/2015 Pager [phone]  I have seen and examined the patient along with Arbutus Leas , NP.  I have reviewed the chart, notes and new data.  I agree with NP's note.  Key new complaints: seen right at end of dialysis session and he seems to be breathing much easier, no longer orthopneic Key examination changes: in atrial flutter with 2:1 AV block and ventricular rate 122 Key new findings / data: reviewed the echo images from February. 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 Possible underlying coronary insufficiency cannot be excluded. 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  Had a long discussion at bedside with the patient and Dr. Marin Olp about the plans for flutter ablation (after TEE to exclude LA thrombus) tomorrow. I explained that he will need a platelet transfusion before the procedure and that  ablation will be deferred if clot is identified on TEE.  Reviewed the potential risks of TEE and RF ablation, insisting in particular on his increased risk of bleeding complications, which can be fatal.  Even more challenging will be the need to maintain some level of anticoagulation for stroke protection following a successful flutter ablation for a month after surgery (per Dr. Tanna Furry note, even 2 weeks will be acceptable).  Will defer the amount and timing of platelet transfusion, as well as the choice of anticoagulation agent to our Hematology colleague, Dr. Marin Olp.  Sanda Klein, MD, [place] [phone] 10/02/2015, 6:53 PM

## 2015-10-02 NOTE — Progress Notes (Signed)
Dr Antonieta Pert consult appreciated.  Discussed with Dr Lovena Le who wanted patient to get 1 dose of ideal weight based Lovenox tonight. I have attempted to confirm with Dr Lovena Le tonight that he knows patient's platelet count was 12K today but have not been able to.    Discussed with Dr C who spoke with patient while he was seeing Dr Marin Olp. He does not feel that giving Lovenox tonight would be safe. Will hold off on Lovenox for now and re-discuss with Dr Lovena Le in the morning.   Dr Marin Olp has written for platelet transfusion tomorrow prior to procedure.   Orders for TEE and flutter ablation written.  Chanetta Marshall, NP 10/02/2015 7:55 PM

## 2015-10-02 NOTE — Progress Notes (Signed)
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 Cardiology consultation placed for recommendations regarding new onset atrial flutter and systolic HF.   Assessment and plan Hypoxic respiratory failure secondary to acute on chronic systolic heart failure, new onset atrial flutter Chest x-ray shows small bilateral pleural effusions Patient also has evidence of chronic systolic heart failure with EF of 25% on 2-D echo done on 07/15/15 This is compounded by anemia secondary to multiple myeloma not a candidate for long-term anticoagulation secondary to severe anemia and thrombocytopenia  ESRD-dialyzed in [place] on Tuesday Thursday Saturday  Hypertension-continue with Norvasc, lisinopril  Multiple myeloma/anemia of chronic disease-severe anemia and thrombocytopenia-now transfusion dependent patient underwent CT-guided Bone marrow biopsy on 09/18/15 followed by [name] and [name] for this,previously on REVLIMID 5 MG capsule  New onset atrial flutter-seen by cardiology and EP, not a candidate for long-term  anticoagulation EP plans TEE cardioversion and catheter ablation, and plans 2 weeks of anticoagulation  Acute on chronic systolic heart failure-tachycardia induced cardiomyopathy? Continue ACE inhibitor, also introduce beta blocker Not a candidate for cardiac catheterization  Anemia secondary to multiple myeloma, hemoglobin trending down, down from 8.5-7.2, platelets also trending down We'll transfuse 1 unit today, possibly will need 2 units of packed red blood cells and platelets tomorrow with dialysis    DVT prophylaxsis SCDs  Code Status:  Full code    Family Communication: Discussed in detail with the patient, all imaging results, lab results explained to the patient   Disposition Plan:  Disposition per cardiology     Consultants:  Cardiology  Nephrology  Procedures:  None  Antibiotics: Anti-infectives    None         HPI/Subjective: Tired ,still sob  Objective: Filed Vitals:   10/01/15 1920 10/01/15 1922 10/01/15 2259 10/02/15 0443  BP: 137/89 132/89 111/70 126/78  Pulse: 99 88 108 79  Temp: 98 F (36.7 C)  98.1 F (36.7 C) 98.2 F (36.8 C)  TempSrc:   Oral Oral  Resp: _0 Height:      Weight: 107 kg (235 lb 14.3 oz)   105.6 kg (232 lb 12.9 oz)  SpO2:   96% 100%    Intake/Output Summary (Last 24 hours) at 10/02/15 0806 Last data filed at 10/02/15 0230  Gross per 24 hour  Intake    240 ml  Output   2852 ml  Net  -2612 ml    Exam:  Examination:  General exam: Appears calm and comfortable  Respiratory system: Clear to auscultation. Respiratory effort normal. Cardiovascular system: S1 & S2 heard, RRR. No JVD, murmurs, rubs, gallops or clicks. No pedal edema. Gastrointestinal  system: Abdomen is nondistended, soft and nontender. No organomegaly or masses felt. Normal bowel sounds heard. Central nervous system: Alert and oriented. No focal neurological deficits. Extremities: Symmetric 5 x 5 power. Skin: No rashes, lesions or  ulcers Psychiatry: Judgement and insight appear normal. Mood & affect appropriate.     Data Reviewed: I have personally reviewed following labs and imaging studies  Micro Results No results found for this or any previous visit (from the past 240 hour(s)).  Radiology Reports Dg Chest 2 View  10/01/2015  CLINICAL DATA:  70 year old male, dialysis patient, with shortness of breath. EXAM: CHEST  2 VIEW COMPARISON:  Chest radiograph dated 07/14/2015 FINDINGS: Small bilateral pleural effusions appear increased compared to prior study. Bibasilar densities may represent atelectatic changes or pneumonia. No pneumothorax. There is stable cardiomegaly. Right pectoral central venous catheter with tip over central SVC. Right axillary vascular stent. There is osteopenia with degenerative changes of the spine. Lower thoracic vertebroplasty changes. IMPRESSION: Small bilateral pleural effusions as well as bibasilar atelectasis/ infiltrate, with interval progression compared to the prior study. Electronically Signed   By: Anner Crete M.D.   On: 10/01/2015 02:05   Ct Biopsy  09/18/2015  INDICATION: 70 year old male with a history of multiple myeloma. He has undergone prior bone marrow transplantation. Repeat bone marrow biopsy requested to evaluate for recurrence. EXAM: CT GUIDED BONE MARROW ASPIRATION AND CORE BIOPSY Interventional Radiologist:  Criselda Peaches, MD MEDICATIONS: None. ANESTHESIA/SEDATION: Moderate (conscious) sedation was employed during this procedure. A total of 1.5 milligrams versed and 50 micrograms fentanyl were administered intravenously. The patient's level of consciousness and vital signs were monitored continuously by radiology nursing throughout the procedure under my direct supervision. Total monitored sedation time: 10 minutes FLUOROSCOPY TIME:  None COMPLICATIONS: None immediate. Estimated blood loss: <25 mL PROCEDURE: Informed written consent was obtained from the patient after a  thorough discussion of the procedural risks, benefits and alternatives. All questions were addressed. Maximal Sterile Barrier Technique was utilized including caps, mask, sterile gowns, sterile gloves, sterile drape, hand hygiene and skin antiseptic. A timeout was performed prior to the initiation of the procedure. The patient was positioned prone and non-contrast localization CT was performed of the pelvis to demonstrate the iliac marrow spaces. Maximal barrier sterile technique utilized including caps, mask, sterile gowns, sterile gloves, large sterile drape, hand hygiene, and betadine prep. Under sterile conditions and local anesthesia, an 11 gauge coaxial bone biopsy needle was advanced into the right iliac marrow space. Needle position was confirmed with CT imaging. Initially, bone marrow aspiration was performed. Next, the 11 gauge outer cannula was utilized to obtain a right iliac bone marrow core biopsy. Needle was removed. Hemostasis was obtained with compression. The patient tolerated the procedure well. Samples were prepared with the cytotechnologist. IMPRESSION: Technically successful CT-guided bone marrow biopsy. Electronically Signed   By: Jacqulynn Cadet M.D.   On: 09/18/2015 11:37   Dg Bone Density  09/09/2015  EXAM: DUAL X-RAY ABSORPTIOMETRY (DXA) FOR BONE MINERAL DENSITY IMPRESSION: Ordering Physician:  Dr. Baird Cancer, Your patient Kaelan Amble completed a BMD test on 09/09/2015 using the [employer]erral (software version: 14.10) manufactured by [employer]. The following summarizes the results of our evaluation. PATIENT BIOGRAPHICAL: Name: SULLIVAN, JACUINDE Patient ID: [id number] Birth Date: [birth year] Height: 70.0 in. Gender: Male Exam Date: 09/09/2015 Weight: 234.0 lbs. Indications: Caucasian, Height Loss, History of Fracture (Adult), Low Calcium Intake, Renal Fractures: Toe Treatments: DENSITOMETRY RESULTS: Site  Region     Measured Date Measured Age WHO  Classification Young Adult T-score BMD         %Change vs. Previous Significant Change (*) AP Spine L1-L3 11/09/[age] N[age]A 2.1 1.468 g/cm2 - - DualFemur Neck Right 09/09/2015 67.9 N[age]A -0.7 0.985 g/cm2 - - ASSESSMENT: This patient is considered normal by World Healh Organization (WHO) Criteria. (L-4 was excluded due to advanced degenerative changes.) World Health Organization [facility]) criteria for post-menopausal, Caucasian Women: Normal:       T-score at or above -1 SD Osteopenia:   T-score between -1 and -2.5 SD Osteoporosis: T-score at or below -2.5 SD RECOMMENDATIONS: [place] recommends that FDA-approved medial therapies be considered in postmenopausal women and men age 36 or older with a: 1. Hip or vertberal (clinical or morphometric) fracture. 2. T-Score of < -2.5 at the spine or hip. 3. Ten-year fracture probability by FRAX of 3% or greater for hip fracture or 20% or greater for major osteoporotic fracture. All treatment decisions require clinical judgment and consideration of indiviual patient factors, including patient preferences, co-morbidities, previous drug use, risk factors not captured in the FRAX model (e.g. falls, vitamin D deficiency, increased bone turnover, interval significant decline in bone density) and possible under-or over-estimation of fracture risk by FRAX. All patients should ensure an adequate intake of dietary calcium (1200 mg/d) and vitamin D (800 IU daily) unless contraindicated. FOLLOW-UP: People with diagnosed cases of osteoporosis or osteopenia should be regularly tested for bone mineral density. For patients eligible for Medicare, routine testing is allowed once every 2 years. Testing frequency can be increased for patients who have rapidly progressing disease, or for those who are receiving medical therapy to restore bone mass. I have reviewed this report, and agree with the above findings. [facility] Electronically Signed   By: Earle Gell M.D.   On: 09/09/2015 11:18     CBC  Recent Labs Lab 09/26/15 1357 10/01/15 0112 10/01/15 0138 10/01/15 1704 10/02/15 0447  WBC 2.7* 3.5*  --  3.7* 3.5*  HGB 8.4* 7.5* 8.5* 7.4* 7.2*  HCT 25.0* 23.4* 25.0* 23.3* 22.1*  PLT 17* 24*  --  21* 12*  MCV 115.7* 118.8*  --  119.5* 116.9*  MCH 38.9* 38.1*  --  37.9* 38.1*  MCHC 33.6 32.1  --  31.8 32.6  RDW 24.2* 24.5*  --  23.7* 23.4*  LYMPHSABS  --  0.9  --   --   --   MONOABS  --  0.2  --   --   --   EOSABS  --  0.1  --   --   --   BASOSABS  --  0.0  --   --   --     Chemistries   Recent Labs Lab 10/01/15 0112 10/01/15 0138 10/01/15 1705 10/02/15 0447  NA 142 140 141 138  K 4.9 5.4* 5.0 4.1  CL 106 106 105 99*  CO2 26  --  25 29  GLUCOSE 122* 124* 137* 90  BUN 47* 60* 55* 30*  CREATININE 6.69* 6.70* 7.41* 4.50*  CALCIUM 10.0  --  9.8 8.9  AST  --   --   --  15  ALT  --   --   --  13*  ALKPHOS  --   --   --  45  BILITOT  --   --   --  1.2   ------------------------------------------------------------------------------------------------------------------ estimated creatinine clearance is 19.4 mL/min (by C-G formula  based on Cr of 4.5). ------------------------------------------------------------------------------------------------------------------ No results for input(s): HGBA1C in the last 72 hours. ------------------------------------------------------------------------------------------------------------------ No results for input(s): CHOL, HDL, LDLCALC, TRIG, CHOLHDL, LDLDIRECT in the last 72 hours. ------------------------------------------------------------------------------------------------------------------ No results for input(s): TSH, T4TOTAL, T3FREE, THYROIDAB in the last 72 hours.  Invalid input(s): FREET3 ------------------------------------------------------------------------------------------------------------------ No results for input(s): VITAMINB12, FOLATE, FERRITIN, TIBC, IRON, RETICCTPCT  in the last 72 hours.  Coagulation profile No results for input(s): INR, PROTIME in the last 168 hours.  No results for input(s): DDIMER in the last 72 hours.  Cardiac Enzymes  Recent Labs Lab 10/01/15 0350 10/01/15 0835 10/01/15 1554  TROPONINI 0.10* 0.11* 0.11*   ------------------------------------------------------------------------------------------------------------------ Invalid input(s): POCBNP   CBG: No results for input(s): GLUCAP in the last 168 hours.     Studies: Dg Chest 2 View  10/01/2015  CLINICAL DATA:  70 year old male, dialysis patient, with shortness of breath. EXAM: CHEST  2 VIEW COMPARISON:  Chest radiograph dated 07/14/2015 FINDINGS: Small bilateral pleural effusions appear increased compared to prior study. Bibasilar densities may represent atelectatic changes or pneumonia. No pneumothorax. There is stable cardiomegaly. Right pectoral central venous catheter with tip over central SVC. Right axillary vascular stent. There is osteopenia with degenerative changes of the spine. Lower thoracic vertebroplasty changes. IMPRESSION: Small bilateral pleural effusions as well as bibasilar atelectasis/ infiltrate, with interval progression compared to the prior study. Electronically Signed   By: Anner Crete M.D.   On: 10/01/2015 02:05      No results found for: HGBA1C Lab Results  Component Value Date   LDLCALC 90 03/03/2013   CREATININE 4.50* 10/02/2015       Scheduled Meds: . sodium chloride   Intravenous Once  . amLODipine  5 mg Oral QHS  . calcium acetate  2,001 mg Oral TID WC  . docusate sodium  100 mg Oral BID  . furosemide  40 mg Oral Daily  . lisinopril  20 mg Oral QHS  . pregabalin  75 mg Oral Daily   Continuous Infusions:       Time spent: >30 MINS    [facility]  [facility] Pager [phone]. If 7PM-7AM, please contact night-coverage at [email or web address], password [facility] 10/02/2015, 8:06 AM

## 2015-10-02 NOTE — Consult Note (Signed)
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 Now with atrial flutter   Chief Complaint  Patient presents with  . Hypertension   my platelets are low.   HPI:  [name] is a 70 year old white male. He has a very extensive past history for hematologic issues. He is diagnosed with end-stage renal failure back in 2006. He is found to have myeloma. He ultimately has undergone to autologous stem cell transplantations. This was done at [facility]. Last transplant was done back in 2013.  Since then, he's been followed at [facility]. He's been followed at the hematology clinic at [facility]. He's been noted have progressive thrombocytopenia. He had a bone marrow test done at [facility] last week or so. This seemed to show marrow abnormalities that were consistent with myelodysplasia. I'll see any cytogenetics that were sent off.  He's been getting blood transfusions. He has been getting platelets transfusions.  He is on hemodialysis.  He now has atrial flutter. The cardiologist wanted to do a ablation on him. They said that he needs to be on anticoagulation for 1 month after the procedure.  His platelet count typically is around 20,000. On May 11, his platelet count 17,000. On the 16th, it was 24,000. He is quite anemic. His MCV is quite high.  He is on visual home. I told him to stop this.  He is not on any aspirin.  He's had no obvious bleeding. He feels tired all the time. So this might be from being anemic. I'm sure he probably is iron overloaded.  Again, he requires a cardiac ablation to try to him back into a normal sinus rhythm.                            Past Medical History  Diagnosis Date  . Hypertension   . Ruptured cervical disc   . Fall resulting in striking against other object     paralyzed  . Multiple myeloma   . 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 Multiple myeloma ([place]) 01/26/2011  . Unspecified vitamin D deficiency 03/06/2013  . Anemia of chronic disease 12/30/2013  . Constipation   . Dialysis patient ([place]) 2016  . Thrombocytopenia ([name]) 08/31/2014  . 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 Constipation   . Hypoxia 09/2015  :  Past Surgical History  Procedure Laterality Date  . Inguinal hernia repair Bilateral   . Anterior cervical decomp/discectomy fusion    . Bone marrow aspirate and biopsy wiith lumbar puncture    . Melanoma excision      rt. breast  . Cervical spine surgery    . Kyphoplasty Bilateral 01/30/2014    Procedure: Thoracic eleven Kyphoplasty;  Surgeon: Consuella Lose, MD;  Location: [facility];  Service: Neurosurgery;  Laterality: Bilateral;  Thoracic eleven Kyphoplasty  . Av fistula placement Right 04/30/2014    Procedure: BRACHIOCEPHALIC ARTERIOVENOUS (AV) FISTULA CREATION;  Surgeon: Conrad Myrtle, MD;  Location: [place];  Service: Vascular;  Laterality: Right;  . Portacath placement Left 02/18/2015    Procedure: INSERTION OF PORT A CATH RIGHT INTERNAL JUGULAR VEIN ;  Surgeon: Conrad Fairview-Ferndale, MD;  Location: [place];  Service: Vascular;  Laterality: Left;  . Cataract  extraction w/phaco Right 04/15/2015    Procedure: CATARACT EXTRACTION PHACO AND INTRAOCULAR LENS PLACEMENT (IOC);  Surgeon: Tonny Branch, MD;  Location: [facility];  Service: Ophthalmology;  Laterality: Right;  CDE:8.97  . Cataract extraction w/phaco Left 04/29/2015    Procedure: CATARACT EXTRACTION PHACO AND INTRAOCULAR LENS PLACEMENT ; CDE:  7.61;  Surgeon: Tonny Branch, MD;  Location: [facility];  Service: Ophthalmology;  Laterality: Left;  :   Current facility-administered medications:  .  0.9 %  sodium chloride infusion, , Intravenous, Once, Nayana Abrol, MD .  0.9 %  sodium chloride infusion, ,  Intravenous, Once, Reyne Dumas, MD .  0.9 %  sodium chloride infusion, , Intravenous, Once, Volanda Napoleon, MD .  acetaminophen (TYLENOL) tablet 650 mg, 650 mg, Oral, Once, Volanda Napoleon, MD .  amLODipine (NORVASC) tablet 5 mg, 5 mg, Oral, QHS, Jared M Gardner, DO, 5 mg at 10/01/15 2115 .  calcium acetate (PHOSLO) capsule 2,001 mg, 2,001 mg, Oral, TID WC, Reyne Dumas, MD, 2,001 mg at 10/02/15 1158 .  docusate sodium (COLACE) capsule 100 mg, 100 mg, Oral, BID, Jared M Gardner, DO, 100 mg at 10/02/15 1158 .  furosemide (LASIX) tablet 40 mg, 40 mg, Oral, Daily, Etta Quill, DO, 40 mg at 10/02/15 1158 .  HYDROcodone-acetaminophen (NORCO/VICODIN) 5-325 MG per tablet 1 tablet, 1 tablet, Oral, Q6H PRN, Etta Quill, DO, 1 tablet at 10/02/15 0444 .  lisinopril (PRINIVIL,ZESTRIL) tablet 20 mg, 20 mg, Oral, QHS, Etta Quill, DO, 20 mg at 10/01/15 2114 .  ondansetron (ZOFRAN) tablet 8 mg, 8 mg, Oral, Q8H PRN, Etta Quill, DO .  pregabalin (LYRICA) capsule 75 mg, 75 mg, Oral, Daily, Etta Quill, DO, 75 mg at 10/02/15 1158 .  sodium chloride flush (NS) 0.9 % injection 10-40 mL, 10-40 mL, Intracatheter, PRN, Reyne Dumas, MD:  . sodium chloride   Intravenous Once  . sodium chloride   Intravenous Once  . sodium chloride   Intravenous Once  . acetaminophen  650 mg Oral Once  . amLODipine  5 mg Oral QHS  . calcium acetate  2,001 mg Oral TID WC  . docusate sodium  100 mg Oral BID  . furosemide  40 mg Oral Daily  . lisinopril  20 mg Oral QHS  . pregabalin  75 mg Oral Daily  :  Allergies  Allergen Reactions  . Pamidronate Anaphylaxis    blood pressure   :  Family History  Problem Relation Age of Onset  . Cancer Sister   . Cancer Brother   :  Social History   Social History  . Marital Status: Divorced    Spouse Name: N/A  . Number of Children: N/A  . Years of Education: N/A   Occupational History  . Not on file.   Social History Main Topics  . Smoking status: Never  Smoker   . Smokeless tobacco: Never Used  . Alcohol Use: No  . Drug Use: No  . Sexual Activity: Not on file   Other Topics Concern  . Not on file   Social History Narrative  :  Pertinent items are noted in HPI.  Exam: Patient Vitals for the past 24 hrs:  BP Temp Temp src Pulse Resp SpO2 Weight  10/02/15 1800 123/65 mmHg - - 65 (!) 24 100 % -  10/02/15 1730 112/67 mmHg - - 94 - - -  10/02/15 1700 139/76 mmHg - - (!) 59 - - -  10/02/15 1650 124/76 mmHg 97.7  F (36.5 C) Oral 90 (!) 24 - -  10/02/15 1630 120/83 mmHg 97.7 F (36.5 C) Oral 95 (!) 24 - -  10/02/15 1615 127/73 mmHg - - 90 - - -  10/02/15 1556 128/75 mmHg 97.8 F (36.6 C) Oral 83 (!) 25 100 % -  10/02/15 1530 127/73 mmHg - - 90 - - -  10/02/15 1500 120/80 mmHg - - 80 - - -  10/02/15 1430 127/78 mmHg - - 78 - - -  10/02/15 1403 127/78 mmHg - - 79 - - -  10/02/15 1320 134/81 mmHg 98.1 F (36.7 C) Oral 79 17 100 % 233 lb 14.5 oz (106.1 kg)  10/02/15 0443 126/78 mmHg 98.2 F (36.8 C) Oral 79 19 100 % 232 lb 12.9 oz (105.6 kg)  10/01/15 2259 111/70 mmHg 98.1 F (36.7 C) Oral (!) 108 18 96 % -  10/01/15 1922 132/89 mmHg - - 88 - - -  10/01/15 1920 137/89 mmHg 98 F (36.7 C) - 99 (!) 24 - 235 lb 14.3 oz (107 kg)  As above there is no splenomegaly. He has some scattered ecchymoses. There is no hepatomegaly. Lungs sounded clear bilaterally. Cardiac exam regular rate and rhythm. Extremity shows some chronic edema in his lower legs.   Recent Labs  10/01/15 1704 10/02/15 0447  WBC 3.7* 3.5*  HGB 7.4* 7.2*  HCT 23.3* 22.1*  PLT 21* 12*    Recent Labs  10/01/15 1705 10/02/15 0447  NA 141 138  K 5.0 4.1  CL 105 99*  CO2 25 29  GLUCOSE 137* 90  BUN 55* 30*  CREATININE 7.41* 4.50*  CALCIUM 9.8 8.9    Blood smear review:  None   Pathology: None     Assessment and Plan:  [name] is a 70 year old white male. He has history of myeloma. Apparently, this is in remission from what I can tell by the notes.  He is not on any therapy for his myeloma.  He's been getting transfusions for blood and platelets.  The real problem that we have with him is that if anything, he is at risk for bleeding because of the thrombocytopenia and I suspect underlying platelet dysfunction from myelodysplasia. I think it be very difficult and very hard for him to have any kind of thromboembolic event from the cardiac ablation.  However, if he needs to be on anticoagulation for a month, I probably would consider him for Coumadin. I don't think we can really use anything else outside of maybe heparin. I'll be worried about the possibility of platelet-induced thrombocytopenia with heparin.  If Coumadin is used, I probably would make sure his INR is 2-3 at the most. I'm sure that pharmacy can help him monitor this while he is inpatient.  Thankfully, he is not on a lot of medications that would interfere with Coumadin.  As far as what appeared him on until Coumadin is therapeutic, I probably would consider heparin. I think heparin would only be needed for several days before the Coumadin is therapeutic. I'm sure pharmacy can really help out with heparin dosing.  I realize this is a very difficult situation. The patient understands very well that he is at risk for bleeding if he is on anticoagulation. Hopefully, the ablation will make him feel better and get his heart out of a fast rhythm so that he will be oh to be more functional. He is willing to take that chance.  He will had to be followed by for  about for 5 days after the procedure so that he can be monitored closely and await the therapeutic level of Coumadin.  I also would consider transfusing him with red blood cells. This can help the clotting function of the platelets.  This is a pretty complex. Again I don't think there is a real "right answer" as to how to anticoagulate him for a month. Again I think for one month, Coumadin would make sense.  If he has bleeding  issues following the cardiac ablation and is on Coumadin, then I would just stop anticoagulating him and just following along knowing that he is theoretically at higher risk for stroke. He understands this.  I written for platelet transfusion for tomorrow morning prior to his procedure.   I would hope that pharmacy would help out afterwards with respect to anticoagulation monitoring and dosing for heparin and Coumadin.  I spent about an hour with him.  He is a really nice guy. Hopefully he will do well.  Pete E.  Psalm 18:1-2

## 2015-10-02 NOTE — Care Management Obs Status (Signed)
[place] NOTIFICATION   Patient Details  Name: [name] MRN: [id number] Date of Birth: [birth year]   Medicare Observation Status Notification Given:  Yes    Dawayne Patricia, RN 10/02/2015, 10:44 AM

## 2015-10-02 NOTE — Progress Notes (Signed)
Admit: 10/01/2015 LOS:   70M ESRD [facility] admitted after possible accidental overdose and found to have AoC sCHF exacerbation and Aflutter  Subjective:  HD Yesterday 2.4L, post weight 107kg For HD again today EP plan developing  05/16 0701 - 05/17 0700 In: 240 [P.O.:240] Out: 3152 [Urine:700]  Filed Weights   10/01/15 1540 10/01/15 1920 10/02/15 0443  Weight: 109.6 kg (241 lb 10 oz) 107 kg (235 lb 14.3 oz) 105.6 kg (232 lb 12.9 oz)    Scheduled Meds: . sodium chloride   Intravenous Once  . sodium chloride   Intravenous Once  . amLODipine  5 mg Oral QHS  . calcium acetate  2,001 mg Oral TID WC  . docusate sodium  100 mg Oral BID  . furosemide  40 mg Oral Daily  . lisinopril  20 mg Oral QHS  . pregabalin  75 mg Oral Daily   Continuous Infusions:  PRN Meds:.HYDROcodone-acetaminophen, ondansetron, sodium chloride flush  Current Labs: reviewed    Physical Exam:  Blood pressure 126/78, pulse 79, temperature 98.2 F (36.8 C), temperature source Oral, resp. rate 19, height 5' 11" (1.803 m), weight 105.6 kg (232 lb 12.9 oz), SpO2 100 %. GEN: NAD ENT: NCAT EYES: EOMI CV: RRR PULM: Diminished in bases ABD: s/tn/nd, obese SKIN: some petechiae in LEs EXT:2+ Edema   A 1. ESRD:  1. [facility],  2. SOB/Pleural Effusions/Hypervolemia 3. AoC sCHF exacerbation, loss of LVEF 4. Atiral Flutter 5. ? Overdose on lisinopril and amlopdine, BP stable -- resolved 6. Anemia in setting of tx for myeloma 7. Chronic TCP 8. Multiple Myeloma  P 1. HD again today, probing EDW lower 2. Request outpt records 3. See plan before writing HD orders for tomorrow.     Ryan Sanford MD 10/02/2015, 1:53 PM   Recent Labs Lab 10/01/15 0112 10/01/15 0138 10/01/15 1705 10/02/15 0447  NA 142 140 141 138  K 4.9 5.4* 5.0 4.1  CL 106 106 105 99*  CO2 26  --  25 29  GLUCOSE 122* 124* 137* 90  BUN 47* 60* 55* 30*  CREATININE 6.69* 6.70* 7.41* 4.50*  CALCIUM 10.0  --   9.8 8.9  PHOS  --   --  5.1*  --     Recent Labs Lab 10/01/15 0112 10/01/15 0138 10/01/15 1704 10/02/15 0447  WBC 3.5*  --  3.7* 3.5*  NEUTROABS 2.3  --   --   --   HGB 7.5* 8.5* 7.4* 7.2*  HCT 23.4* 25.0* 23.3* 22.1*  MCV 118.8*  --  119.5* 116.9*  PLT 24*  --  21* 12*              

## 2015-10-02 NOTE — Progress Notes (Signed)
Spoke with Dr Marin Olp who will see patient today regarding recommendations for anticoagulation post flutter ablation.    Chanetta Marshall, NP 10/02/2015 12:06 PM

## 2015-10-02 NOTE — Progress Notes (Addendum)
For T&S but pt going to HD soon.   Made primary RN aware who is going to notify HD to draw it when he arrives there for dialysis.

## 2015-10-03 ENCOUNTER — Observation Stay (HOSPITAL_BASED_OUTPATIENT_CLINIC_OR_DEPARTMENT_OTHER): Payer: Medicare Other

## 2015-10-03 ENCOUNTER — Encounter (HOSPITAL_COMMUNITY)
Admission: EM | Disposition: A | Discharge status: Home / Self Care | Payer: Self-pay | Source: Home / Self Care | Attending: Emergency Medicine

## 2015-10-03 ENCOUNTER — Encounter (HOSPITAL_COMMUNITY): Payer: Self-pay | Admitting: Internal Medicine

## 2015-10-03 DIAGNOSIS — I483 Typical atrial flutter: Secondary | ICD-10-CM

## 2015-10-03 DIAGNOSIS — I132 Hypertensive heart and chronic kidney disease with heart failure and with stage 5 chronic kidney disease, or end stage renal disease: Secondary | ICD-10-CM | POA: Diagnosis not present

## 2015-10-03 DIAGNOSIS — I10 Essential (primary) hypertension: Secondary | ICD-10-CM | POA: Diagnosis not present

## 2015-10-03 DIAGNOSIS — N186 End stage renal disease: Secondary | ICD-10-CM | POA: Diagnosis not present

## 2015-10-03 DIAGNOSIS — Z992 Dependence on renal dialysis: Secondary | ICD-10-CM | POA: Diagnosis not present

## 2015-10-03 DIAGNOSIS — R7989 Other specified abnormal findings of blood chemistry: Secondary | ICD-10-CM | POA: Diagnosis not present

## 2015-10-03 DIAGNOSIS — I5023 Acute on chronic systolic (congestive) heart failure: Secondary | ICD-10-CM | POA: Diagnosis not present

## 2015-10-03 DIAGNOSIS — I4892 Unspecified atrial flutter: Secondary | ICD-10-CM | POA: Diagnosis not present

## 2015-10-03 HISTORY — PX: ELECTROPHYSIOLOGIC STUDY: SHX172A

## 2015-10-03 LAB — BASIC METABOLIC PANEL
Anion gap: 12 (ref 5–15)
BUN: 30 mg/dL — ABNORMAL HIGH (ref 6–20)
CO2: 29 mmol/L (ref 22–32)
Calcium: 8.7 mg/dL — ABNORMAL LOW (ref 8.9–10.3)
Chloride: 98 mmol/L — ABNORMAL LOW (ref 101–111)
Creatinine, Ser: 3.8 mg/dL — ABNORMAL HIGH (ref 0.61–1.24)
GFR calc Af Amer: 17 mL/min — ABNORMAL LOW (ref >60)
GFR calc non Af Amer: 15 mL/min — ABNORMAL LOW (ref >60)
Glucose, Bld: 88 mg/dL (ref 65–99)
Potassium: 4.2 mmol/L (ref 3.5–5.1)
Sodium: 139 mmol/L (ref 135–145)

## 2015-10-03 LAB — PREPARE PLATELET PHERESIS: Unit division: 0

## 2015-10-03 LAB — CBC
HCT: 26.9 % — ABNORMAL LOW (ref 39.0–52.0)
Hemoglobin: 9 g/dL — ABNORMAL LOW (ref 13.0–17.0)
MCH: 36.6 pg — ABNORMAL HIGH (ref 26.0–34.0)
MCHC: 33.5 g/dL (ref 30.0–36.0)
MCV: 109.3 fL — ABNORMAL HIGH (ref 78.0–100.0)
Platelets: 27 10*3/uL — CL (ref 150–400)
RBC: 2.46 MIL/uL — ABNORMAL LOW (ref 4.22–5.81)
WBC: 3.3 10*3/uL — ABNORMAL LOW (ref 4.0–10.5)

## 2015-10-03 LAB — MRSA PCR SCREENING: MRSA by PCR: NEGATIVE

## 2015-10-03 SURGERY — A-FLUTTER/A-TACH/SVT ABLATION
Anesthesia: LOCAL

## 2015-10-03 SURGERY — ECHOCARDIOGRAM, TRANSESOPHAGEAL
Anesthesia: Moderate Sedation

## 2015-10-03 MED ORDER — WARFARIN SODIUM 5 MG PO TABS
5.0000 mg | ORAL_TABLET | Freq: Once | ORAL | Status: AC
  Administered 2015-10-03: 5 mg via ORAL
  Filled 2015-10-03: qty 1

## 2015-10-03 MED ORDER — HEPARIN SODIUM (PORCINE) 1000 UNIT/ML DIALYSIS: 20.0000 [IU]/kg | INTRAMUSCULAR | Status: DC | PRN

## 2015-10-03 MED ORDER — BUPIVACAINE HCL (PF) 0.25 % IJ SOLN
INTRAMUSCULAR | Status: DC | PRN
  Administered 2015-10-03: 45 mL

## 2015-10-03 MED ORDER — SODIUM CHLORIDE 0.9% FLUSH: 3.0000 mL | INTRAVENOUS | Status: DC | PRN

## 2015-10-03 MED ORDER — FENTANYL CITRATE (PF) 100 MCG/2ML IJ SOLN
INTRAMUSCULAR | Status: AC
  Filled 2015-10-03: qty 2

## 2015-10-03 MED ORDER — SODIUM CHLORIDE 0.9 % IV SOLN: INTRAVENOUS | Status: DC

## 2015-10-03 MED ORDER — HEPARIN (PORCINE) IN NACL 2-0.9 UNIT/ML-% IJ SOLN
INTRAMUSCULAR | Status: DC | PRN
  Administered 2015-10-03: 10:00:00

## 2015-10-03 MED ORDER — BUPIVACAINE HCL (PF) 0.25 % IJ SOLN
INTRAMUSCULAR | Status: AC
  Filled 2015-10-03: qty 30

## 2015-10-03 MED ORDER — SODIUM CHLORIDE 0.9% FLUSH
3.0000 mL | Freq: Two times a day (BID) | INTRAVENOUS | Status: DC
Start: 2015-10-03 — End: 2015-10-06
  Administered 2015-10-03 – 2015-10-04 (×3): 3 mL via INTRAVENOUS

## 2015-10-03 MED ORDER — MIDAZOLAM HCL 5 MG/5ML IJ SOLN
INTRAMUSCULAR | Status: AC
  Filled 2015-10-03: qty 5

## 2015-10-03 MED ORDER — ACETAMINOPHEN 325 MG PO TABS: 650.0000 mg | ORAL_TABLET | ORAL | Status: DC | PRN

## 2015-10-03 MED ORDER — FENTANYL CITRATE (PF) 100 MCG/2ML IJ SOLN
INTRAMUSCULAR | Status: DC | PRN
  Administered 2015-10-03 (×2): 12.5 ug via INTRAVENOUS
  Administered 2015-10-03 (×2): 25 ug via INTRAVENOUS
  Administered 2015-10-03 (×2): 12.5 ug via INTRAVENOUS

## 2015-10-03 MED ORDER — SODIUM CHLORIDE 0.9 % IV SOLN: 250.0000 mL | INTRAVENOUS | Status: DC | PRN

## 2015-10-03 MED ORDER — SODIUM CHLORIDE 0.9 % IV SOLN
INTRAVENOUS | Status: DC
  Administered 2015-10-03: 06:00:00 via INTRAVENOUS

## 2015-10-03 MED ORDER — ONDANSETRON HCL 4 MG/2ML IJ SOLN: 4.0000 mg | Freq: Four times a day (QID) | INTRAMUSCULAR | Status: DC | PRN

## 2015-10-03 MED ORDER — MIDAZOLAM HCL 5 MG/5ML IJ SOLN
INTRAMUSCULAR | Status: DC | PRN
  Administered 2015-10-03 (×4): 1 mg via INTRAVENOUS
  Administered 2015-10-03: 2 mg via INTRAVENOUS
  Administered 2015-10-03 (×2): 1 mg via INTRAVENOUS

## 2015-10-03 MED ORDER — HEPARIN (PORCINE) IN NACL 2-0.9 UNIT/ML-% IJ SOLN
INTRAMUSCULAR | Status: AC
  Filled 2015-10-03: qty 500

## 2015-10-03 MED ORDER — WARFARIN - PHARMACIST DOSING INPATIENT
Freq: Every day | Status: DC
  Administered 2015-10-05: 18:00:00

## 2015-10-03 SURGICAL SUPPLY — 11 items
BAG SNAP BAND KOVER 36X36 (MISCELLANEOUS) ×1 IMPLANT
CATH BLAZERPRIME XP (ABLATOR) ×1 IMPLANT
CATH JOSEPHSON QUAD-ALLRED 6FR (CATHETERS) ×1 IMPLANT
CATH WEB BIDIR CS D-F NONAUTO (CATHETERS) ×1 IMPLANT
PACK EP LATEX FREE (CUSTOM PROCEDURE TRAY) ×2
PACK EP LF (CUSTOM PROCEDURE TRAY) IMPLANT
PAD DEFIB LIFELINK (PAD) ×1 IMPLANT
SHEATH PINNACLE 6F 10CM (SHEATH) ×1 IMPLANT
SHEATH PINNACLE 7F 10CM (SHEATH) ×1 IMPLANT
SHEATH PINNACLE 8F 10CM (SHEATH) ×1 IMPLANT
SHIELD RADPAD SCOOP 12X17 (MISCELLANEOUS) ×1 IMPLANT

## 2015-10-03 NOTE — CV Procedure (Signed)
INDICATIONS: atrial flutter pre-ablation  PROCEDURE:   Informed consent was obtained prior to the procedure. The risks, benefits and alternatives for the procedure were discussed and the patient comprehended these risks.  Risks include, but are not limited to, cough, sore throat, vomiting, nausea, somnolence, esophageal and stomach trauma or perforation, bleeding, low blood pressure, aspiration, pneumonia, infection, trauma to the teeth and death.    After a procedural time-out, the oropharynx was anesthetized with 20% benzocaine spray.   During this procedure the patient was administered a total of Versed 3 mg and Fentanyl 50 mcg to achieve and maintain moderate conscious sedation.  The patient's heart rate, blood pressure, and oxygen saturationweare monitored continuously during the procedure. The period of conscious sedation was 10 minutes, of which I was present face-to-face 100% of this time.  The transesophageal probe was inserted in the esophagus and stomach without difficulty and multiple views were obtained.  The patient was kept under observation until the patient left the procedure room.  The patient left the procedure room in stable condition.   Agitated microbubble saline contrast was not administered.  COMPLICATIONS:    There were no immediate complications.  FINDINGS:  No LA thrombus, fair appendage emptying velocities. Biatrial dilation. Severe global LV hypokinesis, EF 20%. Trivial MR. Mild-mod TR. No pericardial effusion. Normal aorta.  RECOMMENDATIONS:    Proceed with EPS/RFA.  Time Spent Directly with the Patient:  30 minutes   Mack Thurmon 10/03/2015, 9:28 AM

## 2015-10-03 NOTE — Progress Notes (Signed)
  Echocardiogram Echocardiogram Transesophageal has been performed.  [name] 10/03/2015, 11:07 AM

## 2015-10-03 NOTE — Progress Notes (Signed)
Utilization review completed.  

## 2015-10-03 NOTE — Progress Notes (Addendum)
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 Cardiology consultation placed for recommendations regarding new onset atrial flutter and systolic HF.   Assessment and plan Hypoxic respiratory failure secondary to acute on chronic systolic heart failure, new onset atrial flutter Chest x-ray shows small bilateral pleural effusions Patient also has evidence of chronic systolic heart failure with EF of 25% on 2-D echo done on 07/15/15 This is compounded by anemia secondary to multiple myeloma Should improve after cardioversion today if successful  ESRD-dialyzed in [place] on Tuesday Thursday Saturday, as per nephrology  Hypertension-continue with Norvasc, lisinopril  Multiple myeloma/anemia of chronic disease-severe anemia and thrombocytopenia-now transfusion dependent patient underwent CT-guided Bone marrow biopsy on 09/18/15. Seen by Dr Jonette Eva Status post 2 units of packed red blood cells and 2 unit of platelets Hematology recommends to continue prn transfusions   New onset atrial flutter-seen by cardiology and  EP,plan is to start patient on Coumadin after cardioversion for 1 month, No heparin due to risk of  worsening thrombocytopenia If he starts bleeding, would need to stop  Coumadin, if patient unable to continue Coumadin and he understands the risk of stroke,TEE shows no LA thrombus, severe global hypokinesis with EF of 20%, no pericardial effusion   Acute on chronic systolic heart failure-tachycardia induced cardiomyopathy? Continue ACE inhibitor, also introduce beta blocker Not a candidate for cardiac catheterization  as he would not be able to tolerate DAPT if PCI is needed  Anemia secondary to multiple myeloma, Improved after receiving packed red blood cells and platelets, continue to follow closely Hematology would like to sustain transfusions for now Has received a total of 17 platelets and 10 units of packed red blood cells in 2017   DVT prophylaxsis SCDs  Code Status:  Full code    Family Communication: Discussed in detail with the patient, all imaging results, lab results explained to the patient   Disposition Plan: Anticipate discharge when INR is therapeutic, recommend [place] meeting after discussing prognosis with oncology    Consultants:  Cardiology  Nephrology  Procedures:  None  Antibiotics: Anti-infectives    None         HPI/Subjective:Still short of breath   Objective: Filed Vitals:   10/03/15 1102 10/03/15 1107 10/03/15 1111 10/03/15 1116  BP: 137/89     Pulse: 82 86  157  Temp:      TempSrc:      Resp: 27 0 0 0  Height:      Weight:      SpO2: 94% 0% 0% 0%    Intake/Output Summary (Last 24 hours) at 10/03/15 1121 Last data filed at 10/03/15 9432  Gross  per 24 hour  Intake   1902 ml  Output   3585 ml  Net  -1683 ml    Exam:  Examination:  General exam: Appears calm and comfortable  Respiratory system: Clear to auscultation. Respiratory effort normal. Cardiovascular system: S1 & S2 heard, RRR. No JVD, murmurs, rubs, gallops or  clicks. No pedal edema. Gastrointestinal system: Abdomen is nondistended, soft and nontender. No organomegaly or masses felt. Normal bowel sounds heard. Central nervous system: Alert and oriented. No focal neurological deficits. Extremities: Symmetric 5 x 5 power. Skin: No rashes, lesions or ulcers Psychiatry: Judgement and insight appear normal. Mood & affect appropriate.     Data Reviewed: I have personally reviewed following labs and imaging studies  Micro Results Recent Results (from the past 240 hour(s))  MRSA PCR Screening     Status: None   Collection Time: 10/03/15  2:00 AM  Result Value Ref Range Status   MRSA by PCR NEGATIVE NEGATIVE Final    Comment:        The GeneXpert MRSA Assay (FDA approved for NASAL specimens only), is one component of a comprehensive MRSA colonization surveillance program. It is not intended to diagnose MRSA infection nor to guide or monitor treatment for MRSA infections.     Radiology Reports Dg Chest 2 View  10/01/2015  CLINICAL DATA:  70 year old male, dialysis patient, with shortness of breath. EXAM: CHEST  2 VIEW COMPARISON:  Chest radiograph dated 07/14/2015 FINDINGS: Small bilateral pleural effusions appear increased compared to prior study. Bibasilar densities may represent atelectatic changes or pneumonia. No pneumothorax. There is stable cardiomegaly. Right pectoral central venous catheter with tip over central SVC. Right axillary vascular stent. There is osteopenia with degenerative changes of the spine. Lower thoracic vertebroplasty changes. IMPRESSION: Small bilateral pleural effusions as well as bibasilar atelectasis/ infiltrate, with interval progression compared to the prior study. Electronically Signed   By: Anner Crete M.D.   On: 10/01/2015 02:05   Ct Biopsy  09/18/2015  INDICATION: 70 year old male with a history of multiple myeloma. He has undergone prior bone marrow transplantation. Repeat bone marrow biopsy requested to  evaluate for recurrence. EXAM: CT GUIDED BONE MARROW ASPIRATION AND CORE BIOPSY Interventional Radiologist:  Criselda Peaches, MD MEDICATIONS: None. ANESTHESIA/SEDATION: Moderate (conscious) sedation was employed during this procedure. A total of 1.5 milligrams versed and 50 micrograms fentanyl were administered intravenously. The patient's level of consciousness and vital signs were monitored continuously by radiology nursing throughout the procedure under my direct supervision. Total monitored sedation time: 10 minutes FLUOROSCOPY TIME:  None COMPLICATIONS: None immediate. Estimated blood loss: <25 mL PROCEDURE: Informed written consent was obtained from the patient after a thorough discussion of the procedural risks, benefits and alternatives. All questions were addressed. Maximal Sterile Barrier Technique was utilized including caps, mask, sterile gowns, sterile gloves, sterile drape, hand hygiene and skin antiseptic. A timeout was performed prior to the initiation of the procedure. The patient was positioned prone and non-contrast localization CT was performed of the pelvis to demonstrate the iliac marrow spaces. Maximal barrier sterile technique utilized including caps, mask, sterile gowns, sterile gloves, large sterile drape, hand hygiene, and betadine prep. Under sterile conditions and local anesthesia, an 11 gauge coaxial bone biopsy needle was advanced into the right iliac marrow space. Needle position was confirmed with CT imaging. Initially, bone marrow aspiration was performed. Next, the 11 gauge outer cannula was utilized to obtain a right iliac bone marrow core biopsy. Needle was removed. Hemostasis was obtained with compression.  The patient tolerated the procedure well. Samples were prepared with the cytotechnologist. IMPRESSION: Technically successful CT-guided bone marrow biopsy. Electronically Signed   By: Jacqulynn Cadet M.D.   On: 09/18/2015 11:37   Dg Bone Density  09/09/2015  EXAM:  DUAL X-RAY ABSORPTIOMETRY (DXA) FOR BONE MINERAL DENSITY IMPRESSION: Ordering Physician:  Dr. Baird Cancer, Your patient Tyron Manetta completed a BMD test on 09/09/2015 using the [place] (software version: 14.10) manufactured by [employer]. The following summarizes the results of our evaluation. PATIENT BIOGRAPHICAL: Name: LAKYN, MANTIONE Patient ID: [id number] Birth Date: 1946-04-20 Height: 70.0 in. Gender: Male Exam Date: 09/09/2015 Weight: 234.0 lbs. Indications: Caucasian, Height Loss, History of Fracture (Adult), Low Calcium Intake, Renal Fractures: Toe Treatments: DENSITOMETRY RESULTS: Site      Region     Measured Date Measured Age WHO Classification Young Adult T-score BMD         %Change vs. Previous Significant Change (*) AP Spine L1-L3 09/09/[age] N/A 2.1 1.468 g/cm2 - - DualFemur Neck Right 09/09/2015 69.9 N[age]A -0.7 0.985 g/cm2 - - ASSESSMENT: This patient is considered normal by World Healh Organization (WHO) Criteria. (L-4 was excluded due to advanced degenerative changes.) World Health Organization [facility]) criteria for post-menopausal, Caucasian Women: Normal:       T-score at or above -1 SD Osteopenia:   T-score between -1 and -2.5 SD Osteoporosis: T-score at or below -2.5 SD RECOMMENDATIONS: [place] recommends that FDA-approved medial therapies be considered in postmenopausal women and men age 7 or older with a: 1. Hip or vertberal (clinical or morphometric) fracture. 2. T-Score of < -2.5 at the spine or hip. 3. Ten-year fracture probability by FRAX of 3% or greater for hip fracture or 20% or greater for major osteoporotic fracture. All treatment decisions require clinical judgment and consideration of indiviual patient factors, including patient preferences, co-morbidities, previous drug use, risk factors not captured in the FRAX model (e.g. falls, vitamin D deficiency, increased bone turnover, interval significant decline in bone  density) and possible under-or over-estimation of fracture risk by FRAX. All patients should ensure an adequate intake of dietary calcium (1200 mg/d) and vitamin D (800 IU daily) unless contraindicated. FOLLOW-UP: People with diagnosed cases of osteoporosis or osteopenia should be regularly tested for bone mineral density. For patients eligible for Medicare, routine testing is allowed once every 2 years. Testing frequency can be increased for patients who have rapidly progressing disease, or for those who are receiving medical therapy to restore bone mass. I have reviewed this report, and agree with the above findings. [facility] Electronically Signed   By: Earle Gell M.D.   On: 09/09/2015 11:18     CBC  Recent Labs Lab 09/26/15 1357 10/01/15 0112 10/01/15 0138 10/01/15 1704 10/02/15 0447 10/03/15 0700  WBC 2.7* 3.5*  --  3.7* 3.5* 3.3*  HGB 8.4* 7.5* 8.5* 7.4* 7.2* 9.0*  HCT 25.0* 23.4* 25.0* 23.3* 22.1* 26.9*  PLT 17* 24*  --  21* 12* 27*  MCV 115.7* 118.8*  --  119.5* 116.9* 109.3*  MCH 38.9* 38.1*  --  37.9* 38.1* 36.6*  MCHC 33.6 32.1  --  31.8 32.6 33.5  RDW 24.2* 24.5*  --  23.7* 23.4*  --   LYMPHSABS  --  0.9  --   --   --   --   MONOABS  --  0.2  --   --   --   --   EOSABS  --  0.1  --   --   --   --   BASOSABS  --  0.0  --   --   --   --     Chemistries   Recent Labs Lab 10/01/15 0112 10/01/15 0138 10/01/15 1705 10/02/15 0447 10/03/15 0700  NA 142 140 141 138 139  K 4.9 5.4* 5.0 4.1 4.2  CL 106 106 105 99* 98*  CO2 26  --  25 29 29   GLUCOSE 122* 124* 137* 90 88  BUN 47* 60* 55* 30* 30*  CREATININE 6.69* 6.70* 7.41* 4.50* 3.80*  CALCIUM 10.0  --  9.8 8.9 8.7*  AST  --   --   --  15  --   ALT  --   --   --  13*  --   ALKPHOS  --   --   --  45  --   BILITOT  --   --   --  1.2  --    ------------------------------------------------------------------------------------------------------------------ estimated creatinine clearance is 23.1 mL/min  (by C-G formula based on Cr of 3.8). ------------------------------------------------------------------------------------------------------------------ No results for input(s): HGBA1C in the last 72 hours. ------------------------------------------------------------------------------------------------------------------ No results for input(s): CHOL, HDL, LDLCALC, TRIG, CHOLHDL, LDLDIRECT in the last 72 hours. ------------------------------------------------------------------------------------------------------------------ No results for input(s): TSH, T4TOTAL, T3FREE, THYROIDAB in the last 72 hours.  Invalid input(s): FREET3 ------------------------------------------------------------------------------------------------------------------ No results for input(s): VITAMINB12, FOLATE, FERRITIN, TIBC, IRON, RETICCTPCT in the last 72 hours.  Coagulation profile No results for input(s): INR, PROTIME in the last 168 hours.  No results for input(s): DDIMER in the last 72 hours.  Cardiac Enzymes  Recent Labs Lab 10/01/15 0350 10/01/15 0835 10/01/15 1554  TROPONINI 0.10* 0.11* 0.11*   ------------------------------------------------------------------------------------------------------------------ Invalid input(s): POCBNP   CBG: No results for input(s): GLUCAP in the last 168 hours.     Studies: No results found.    No results found for: HGBA1C Lab Results  Component Value Date   LDLCALC 90 03/03/2013   CREATININE 3.80* 10/03/2015       Scheduled Meds: . [MAR Hold] sodium chloride   Intravenous Once  . [MAR Hold] sodium chloride   Intravenous Once  . sodium chloride   Intravenous Once  . [MAR Hold] amLODipine  5 mg Oral QHS  . [MAR Hold] calcium acetate  2,001 mg Oral TID WC  . [MAR Hold] docusate sodium  100 mg Oral BID  . [MAR Hold] furosemide  40 mg Oral Daily  . [MAR Hold] lisinopril  20 mg Oral QHS  . [MAR Hold] pregabalin  75 mg Oral Daily   Continuous  Infusions: . sodium chloride 10 mL/hr at 10/03/15 0548        Time spent: >30 MINS    [facility] Pager [phone]. If 7PM-7AM, please contact night-coverage at [email or web address], password [email or web address] 10/03/2015, 11:21 AM

## 2015-10-03 NOTE — H&P (View-Only) (Signed)
 Reason for Consult: Atrial flutter  Referring Physician: Dr. Croitoru  [name] is an 70 y.o. male.   HPI: The patient is a 70 yo man with a h/o multiple myeloma, HTN, peripheral vascular disease, and thrombocytopenia who was admitted with worsening CHF and found to have atrial flutter with a RVR/ CVR. He has been treated with HD for his volume overload with improvement. The patient does not feel palpitations. He notes that he has felt worse the past couple of months.  He was in rhythm back in October but was out of rhythm since February. He does not have palpitations.  PMH: Past Medical History  Diagnosis Date  . Hypertension   . Ruptured cervical disc   . Fall resulting in striking against other object     paralyzed  . Multiple myeloma   . 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 Multiple myeloma (HCC) 01/26/2011  . Unspecified vitamin D deficiency 03/06/2013  . Anemia of chronic disease 12/30/2013  . Constipation   . Dialysis patient (HCC) 2016  . Thrombocytopenia (HCC) 08/31/2014  . 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 Constipation   . Hypoxia 09/2015    PSHX: Past Surgical History  Procedure Laterality Date  . Inguinal hernia repair Bilateral   . Anterior cervical decomp/discectomy fusion    . Bone marrow aspirate and biopsy wiith lumbar puncture    . Melanoma excision      rt. breast  . Cervical spine surgery    . Kyphoplasty Bilateral 01/30/2014    Procedure: Thoracic eleven Kyphoplasty;  Surgeon: Neelesh Nundkumar, MD;  Location: [facility];  Service: Neurosurgery;  Laterality: Bilateral;  Thoracic eleven Kyphoplasty  . Av fistula placement Right 04/30/2014    Procedure: BRACHIOCEPHALIC ARTERIOVENOUS (AV) FISTULA CREATION;  Surgeon: Brian L Chen, MD;  Location: [facility] OR;  Service: Vascular;  Laterality: Right;  .  Portacath placement Left 02/18/2015    Procedure: INSERTION OF PORT A CATH RIGHT INTERNAL JUGULAR VEIN ;  Surgeon: Brian L Chen, MD;  Location: [facility] OR;  Service: Vascular;  Laterality: Left;  . Cataract extraction w/phaco Right 04/15/2015    Procedure: CATARACT EXTRACTION PHACO AND INTRAOCULAR LENS PLACEMENT (IOC);  Surgeon: Kerry Hunt, MD;  Location: [facility];  Service: Ophthalmology;  Laterality: Right;  CDE:8.97  . Cataract extraction w/phaco Left 04/29/2015    Procedure: CATARACT EXTRACTION PHACO AND INTRAOCULAR LENS PLACEMENT ; CDE:  7.61;  Surgeon: Kerry Hunt, MD;  Location: [facility];  Service: Ophthalmology;  Laterality: Left;    FAMHX: Family History  Problem Relation Age of Onset  . Cancer Sister   . Cancer Brother     Social History:  reports that he has never smoked. He has never used smokeless tobacco. He reports that he does not drink alcohol or use illicit drugs.  Allergies:  Allergies  Allergen Reactions  . Pamidronate Anaphylaxis    blood pressure     Medications: I have reviewed the patient's current medications.  Dg Chest 2 View  10/01/2015  CLINICAL DATA:  70-year-old male, dialysis patient, with shortness of breath. EXAM: CHEST  2 VIEW COMPARISON:  Chest radiograph dated 07/14/2015 FINDINGS: Small bilateral pleural effusions appear increased compared to prior study. Bibasilar densities may represent atelectatic changes or pneumonia. No pneumothorax. There is stable cardiomegaly. Right pectoral central venous catheter with tip over   central SVC. Right axillary vascular stent. There is osteopenia with degenerative changes of the spine. Lower thoracic vertebroplasty changes. IMPRESSION: Small bilateral pleural effusions as well as bibasilar atelectasis/ infiltrate, with interval progression compared to the prior study. Electronically Signed   By: Arash  Radparvar M.D.   On: 10/01/2015 02:05    ROS  As stated in the HPI and negative for all other systems.  Physical  Exam  Vitals:Blood pressure 146/101, pulse 91, temperature 98.1 F (36.7 C), temperature source Oral, resp. rate 24, height 5' 11" (1.803 m), weight 241 lb 10 oz (109.6 kg), SpO2 98 %.  Chronically ill appearing man, NAD HEENT: Unremarkable Neck:  6 cm JVD, no thyromegally Lymphatics:  No adenopathy Back:  No CVA tenderness Lungs:  Basilar rales but no wheezes HEART:  Regular tachy rhythm, no murmurs, no rubs, no clicks Abd:  Flat, positive bowel sounds, no organomegally, no rebound, no guarding Ext:  2 plus pulses, no edema, no cyanosis, no clubbing Skin:  No rashes no nodules Neuro:  CN II through XII intact, motor grossly intact   ECG - atrial flutter with a CVR/RVR Tele - atrial flutter with a CVR/RVR  Echo - reviewed. New onset LV dysfunction  Assessment/Plan: 1. Atrial fltutter - I have discussed the treatment with the patient and included DCCV and catheter ablation. With his thrombocytopenia, long term anti-coagulation is contra-indicated. We will hopefully be able to do a TEE and ablate his flutter and then plan 2 weeks of anti-coagulation. 2. Chronic systolic heart failure - his time course might suggest the he has developed a tachy induced CM. In addition to rate control, he will a beta blocker and possible ARB/ACE 3. Hypertensive heart disease - will adjust medications as needed.  Bodey Frizell TaylorMD 10/01/2015, 4:19 PM      

## 2015-10-03 NOTE — Progress Notes (Signed)
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    OBJECTIVE Filed Vitals:   10/03/15 0233 10/03/15 0308 10/03/15 0529 10/03/15 0545  BP: 124/78 128/79 132/91 128/91  Pulse: 78 78 77 78  Temp: 98.7 F (37.1 C) 98.1 F (36.7 C) 98.3 F (36.8 C) 98.1 F (36.7 C)  TempSrc: Oral Oral Oral Oral  Resp: 18 20 18 18   Height:      Weight:      SpO2: 100% 100% 100% 100%    Intake/Output Summary (Last 24 hours) at 10/03/15 0740 Last data filed at 10/03/15 0520  Gross per 24 hour  Intake   1920 ml  Output   3685 ml  Net  -1765 ml   Filed Weights   10/01/15 1920 10/02/15 0443 10/02/15 1320  Weight: 235 lb 14.3 oz (107 kg) 232 lb 12.9 oz (105.6 kg) 233 lb 14.5 oz (106.1 kg)    PHYSICAL EXAM General: Well developed, well nourished, male in no acute distress. Head: Normocephalic, atraumatic.  Neck: Supple without bruits, No JVD. Lungs:  Resp regular and unlabored, CTA. Heart: RRR, S1, S2, no S3, S4, or murmur; no rub. Abdomen: Soft, non-tender, non-distended, BS + x 4.  Extremities: No clubbing, cyanosis,No edema.  Neuro: Alert and oriented X 3. Moves all extremities spontaneously. Psych: Normal affect.  LABS: CBC: Recent Labs  10/01/15 0112  10/02/15 0447 10/03/15 0700  WBC 3.5*  < > 3.5* 3.3*  NEUTROABS 2.3  --   --   --   HGB 7.5*  < > 7.2* 9.0*  HCT 23.4*  < > 22.1* 26.9*  MCV 118.8*  < > 116.9* 109.3*    PLT 24*  < > 12* 27*  < > = values in this interval not displayed. Basic Metabolic Panel: Recent Labs  10/01/15 1705 10/02/15 0447 10/03/15 0700  NA 141 138 139  K 5.0 4.1 4.2  CL 105 99* 98*  CO2 25 29 29   GLUCOSE 137* 90 88  BUN 55* 30* 30*  CREATININE 7.41* 4.50* 3.80*  CALCIUM 9.8 8.9 8.7*  PHOS 5.1*  --   --    Liver Function Tests: Recent Labs  10/01/15 1705 10/02/15 0447  AST  --  15  ALT  --  13*  ALKPHOS  --  45  BILITOT  --  1.2  PROT  --  6.1*  ALBUMIN 3.4* 3.1*   Cardiac Enzymes: Recent Labs  10/01/15 0350 10/01/15 0835 10/01/15 1554  TROPONINI 0.10* 0.11* 0.11*    Recent Labs  10/01/15 0135  TROPIPOC 0.11*    Current facility-administered medications:  .  0.9 %  sodium chloride infusion, , Intravenous, Once, Nayana Abrol, MD .  0.9 %  sodium chloride infusion, , Intravenous, Once, Reyne Dumas, MD .  0.9 %  sodium chloride infusion, , Intravenous, Once, Volanda Napoleon, MD, Last Rate: 10 mL/hr at 10/02/15 2130 .  0.9 %  sodium chloride infusion, , Intravenous, Continuous, Amber Sena Slate, NP, Last Rate:  10 mL/hr at 10/03/15 0548 .  amLODipine (NORVASC) tablet 5 mg, 5 mg, Oral, QHS, Jared M Gardner, DO, 5 mg at 10/02/15 2127 .  calcium acetate (PHOSLO) capsule 2,001 mg, 2,001 mg, Oral, TID WC, Reyne Dumas, MD, 2,001 mg at 10/02/15 2022 .  docusate sodium (COLACE) capsule 100 mg, 100 mg, Oral, BID, Jared M Gardner, DO, 100 mg at 10/02/15 2128 .  furosemide (LASIX) tablet 40 mg, 40 mg, Oral, Daily, Etta Quill, DO, 40 mg at 10/02/15 1158 .  HYDROcodone-acetaminophen (NORCO/VICODIN) 5-325 MG per tablet 1 tablet, 1 tablet, Oral, Q6H PRN, Etta Quill, DO, 1 tablet at 10/02/15 2313 .  lisinopril (PRINIVIL,ZESTRIL) tablet 20 mg, 20 mg, Oral, QHS, Etta Quill, DO, 20 mg at 10/02/15 2128 .  ondansetron (ZOFRAN) tablet 8 mg, 8 mg, Oral, Q8H PRN, Etta Quill, DO .  pregabalin (LYRICA) capsule 75 mg, 75 mg, Oral, Daily, Etta Quill, DO,  75 mg at 10/02/15 1158 .  sodium chloride flush (NS) 0.9 % injection 10-40 mL, 10-40 mL, Intracatheter, PRN, Reyne Dumas, MD . sodium chloride 10 mL/hr at 10/03/15 0548    TELE:  Aflutter      ECG: Aflutter Current Medications:  . sodium chloride   Intravenous Once  . sodium chloride   Intravenous Once  . sodium chloride   Intravenous Once  . amLODipine  5 mg Oral QHS  . calcium acetate  2,001 mg Oral TID WC  . docusate sodium  100 mg Oral BID  . furosemide  40 mg Oral Daily  . lisinopril  20 mg Oral QHS  . pregabalin  75 mg Oral Daily   . sodium chloride 10 mL/hr at 10/03/15 0548    ASSESSMENT AND PLAN: Active Problems:   Essential hypertension, benign   ESRD on dialysis (HCC)   Elevated troponin   Hypoxia   Fluid overload   Atrial flutter with controlled response (HCC)   Systolic HF (heart failure) ([place])  1. New Onset Atrial Flutter: Plan is for TEE (to exclude LA thrombus) and atrial flutter ablation today, patient is thrombocytopenic. Today's platelet count is 27,000. Hematology consulted, warfarin is reccommended for anticoagulation following ablation. He will need a platelet transfusion before procedure. The order has been written and will be given this am. He also got 2 untis of PRBC's last night per hematology. Renal is also aware of plans for today.   2. Systolic HF: EF by echo 09/6254 was reduced down to 25% with diffuse hypokinesis, compared to normal EFs in 2009 and 2015, previously 50-60%. Several potential etiologies may be responsible for new LV dysfunction, including chronic anemia, medication induced from chemotherapy agent vs tachy mediated from potential PAF vs ischemic etiology. Given his multiple comorbidities, including severe anemia and thrombocytopenia-now transfusion dependent, he would not be a candidate for cardiac catheterization, as he would not be able to tolerate DAPT if PCI is needed.   3. Elevated Troponin: low level with flat trend, in the setting  of CKD and atrial flutter. He denies CP. As outlined above, would not favor aggressive ischemic w/u given multiple co morbidities.   4. Hypertension: BP is labile, pt is on ACEI and amlodipine now.   5. ESRD on HD: Nephrology following, pt had HD yesterday.   Signed, Arbutus Leas , NP 7:40 AM 10/03/2015 Pager [phone]  I have seen and examined the patient along with Arbutus Leas , NP.  I have reviewed the chart, notes and new data.  I agree with PA/NP's note.  Able to lie flat without dyspnea. Receiving platelets.  PLAN: TEE followed by atrial flutter ablation today. This procedure has been fully reviewed with the patient and written informed consent has been obtained.   Sanda Klein, MD, [place] [phone] 10/03/2015, 8:56 AM

## 2015-10-03 NOTE — Interval H&P Note (Signed)
History and Physical Interval Note:  10/03/2015 9:33 AM  [name]  has presented today for surgery, with the diagnosis of flutter  The various methods of treatment have been discussed with the patient and family. After consideration of risks, benefits and other options for treatment, the patient has consented to  Procedure(s): A-Flutter Ablation (N/A) as a surgical intervention .  The patient's history has been reviewed, patient examined, no change in status, stable for surgery.  I have reviewed the patient's chart and labs.  Questions were answered to the patient's satisfaction.     [name]

## 2015-10-03 NOTE — Progress Notes (Signed)
Site area: rt groin 3 rt fv sheaths Site Prior to Removal:  Level 0 Pressure Applied For:  15 minutes Manual:   yes Patient Status During Pull:  stable Post Pull Site:  Level 0 Post Pull Instructions Given:  yes Post Pull Pulses Present: yes Dressing Applied:  Small tegaderm Bedrest begins @  1150 Comments:  IV saline locked

## 2015-10-03 NOTE — Progress Notes (Signed)
Admit: 10/01/2015 LOS:   42M ESRD THS [facility] admitted after possible accidental overdose and found to have AoC sCHF exacerbation and Aflutter  Subjective:  HD Yesterday 3.4L, post weight 106.1kg S/P ablation for a flutter - no c/o's   05/17 0701 - 05/18 0700 In: 1920 [P.O.:720; Blood:1200] Out: 3685 [Urine:200]  Filed Weights   10/01/15 1920 10/02/15 0443 10/02/15 1320  Weight: 107 kg (235 lb 14.3 oz) 105.6 kg (232 lb 12.9 oz) 106.1 kg (233 lb 14.5 oz)    Scheduled Meds: . sodium chloride   Intravenous Once  . sodium chloride   Intravenous Once  . sodium chloride   Intravenous Once  . amLODipine  5 mg Oral QHS  . calcium acetate  2,001 mg Oral TID WC  . docusate sodium  100 mg Oral BID  . furosemide  40 mg Oral Daily  . lisinopril  20 mg Oral QHS  . pregabalin  75 mg Oral Daily  . sodium chloride flush  3 mL Intravenous Q12H  . warfarin  5 mg Oral ONCE-1800  . Warfarin - Pharmacist Dosing Inpatient   Does not apply q1800   Continuous Infusions: . sodium chloride     PRN Meds:.sodium chloride, acetaminophen, heparin, HYDROcodone-acetaminophen, ondansetron (ZOFRAN) IV, ondansetron, sodium chloride flush, sodium chloride flush  Current Labs: reviewed    Physical Exam:  Blood pressure 139/79, pulse 80, temperature 97.6 F (36.4 C), temperature source Oral, resp. rate 18, height 5' 11"  (1.803 m), weight 106.1 kg (233 lb 14.5 oz), SpO2 95 %. GEN: NAD ENT: NCAT EYES: EOMI CV: RRR PULM: Diminished in bases ABD: s/tn/nd, obese SKIN: some petechiae in LEs EXT:2+ Edema   A 1. ESRD:  1. THS [facility], done Tuesday and Wednesday this week so holding today - post weight yest was 106.1 2. SOB/Pleural Effusions/Hypervolemia 3. AoC sCHF exacerbation, loss of LVEF 4. Atiral Flutter 5. ? Overdose on lisinopril and amlopdine, BP stable -- resolved 6. Anemia in setting of tx for myeloma- hgb better after what looks like transfusion  7. Chronic  TCP 8. Multiple Myeloma  P 1. No HD today- possibly just get back on schedule on Saturday       [name] A   10/03/2015, 3:12 PM   Recent Labs Lab 10/01/15 1705 10/02/15 0447 10/03/15 0700  NA 141 138 139  K 5.0 4.1 4.2  CL 105 99* 98*  CO2 25 29 29   GLUCOSE 137* 90 88  BUN 55* 30* 30*  CREATININE 7.41* 4.50* 3.80*  CALCIUM 9.8 8.9 8.7*  PHOS 5.1*  --   --     Recent Labs Lab 10/01/15 0112  10/01/15 1704 10/02/15 0447 10/03/15 0700  WBC 3.5*  --  3.7* 3.5* 3.3*  NEUTROABS 2.3  --   --   --   --   HGB 7.5*  < > 7.4* 7.2* 9.0*  HCT 23.4*  < > 23.3* 22.1* 26.9*  MCV 118.8*  --  119.5* 116.9* 109.3*  PLT 24*  --  21* 12* 27*  < > = values in this interval not displayed.

## 2015-10-03 NOTE — Progress Notes (Signed)
Called and spoke to on-call physician, K. Schorr  Regarding blood products to be transfused to mr. Hasty prior to TEE in the A.M. There was a need for clarification on orders that were thought to have been clarified by nurse Chester Holstein RN, earlier in this shift. Reviewed orders with K. Schorr and will now tranfuse 1 unit of PRBCs per MD order. Will continue to monitor patient.

## 2015-10-03 NOTE — Progress Notes (Addendum)
ANTICOAGULATION CONSULT NOTE - Initial Consult  Pharmacy Consult for warfarin Indication: atrial fibrillation/flutter  Allergies  Allergen Reactions  . Pamidronate Anaphylaxis    blood pressure     Patient Measurements: Height: 5' 11"  (180.3 cm) Weight: 233 lb 14.5 oz (106.1 kg) IBW/kg (Calculated) : 75.3   Vital Signs: Temp: 97.6 F (36.4 C) (05/18 1239) Temp Source: Oral (05/18 1239) BP: 139/79 mmHg (05/18 1239) Pulse Rate: 80 (05/18 1239)  Labs:  Recent Labs  10/01/15 0350 10/01/15 0835 10/01/15 1554 10/01/15 1704 10/01/15 1705 10/02/15 0447 10/03/15 0700  HGB  --   --   --  7.4*  --  7.2* 9.0*  HCT  --   --   --  23.3*  --  22.1* 26.9*  PLT  --   --   --  21*  --  12* 27*  CREATININE  --   --   --   --  7.41* 4.50* 3.80*  TROPONINI 0.10* 0.11* 0.11*  --   --   --   --     Estimated Creatinine Clearance: 23.1 mL/min (by C-G formula based on Cr of 3.8).   Assessment: 10 YOM with extensive PMH including multiple myeloma and chronic anemia and thrombocytopenia. Followed by heme/onc and requires PRN transfusions. With new onset AFlutter, to start warfarin post-ablation without heparin or Lovenox bridge d/t high risk of bleeding.  hgb 9, plts 27 today. LFTs nml.  Goal of Therapy:  INR 2-3 Monitor platelets by anticoagulation protocol: Yes   Plan:  -warfarin 84m po x1 tonight -daily INR -follow very closely for s/s/ bleeding  Justyce Yeater D. Lia Vigilante, PharmD, BCPS Clinical Pharmacist Pager: [phone]/18/2017 1:08 PM b

## 2015-10-04 DIAGNOSIS — R7989 Other specified abnormal findings of blood chemistry: Secondary | ICD-10-CM | POA: Diagnosis not present

## 2015-10-04 DIAGNOSIS — I4891 Unspecified atrial fibrillation: Secondary | ICD-10-CM

## 2015-10-04 DIAGNOSIS — N186 End stage renal disease: Secondary | ICD-10-CM | POA: Diagnosis not present

## 2015-10-04 DIAGNOSIS — I132 Hypertensive heart and chronic kidney disease with heart failure and with stage 5 chronic kidney disease, or end stage renal disease: Secondary | ICD-10-CM | POA: Diagnosis not present

## 2015-10-04 DIAGNOSIS — I5023 Acute on chronic systolic (congestive) heart failure: Secondary | ICD-10-CM | POA: Diagnosis not present

## 2015-10-04 DIAGNOSIS — Z992 Dependence on renal dialysis: Secondary | ICD-10-CM | POA: Diagnosis not present

## 2015-10-04 DIAGNOSIS — D696 Thrombocytopenia, unspecified: Secondary | ICD-10-CM | POA: Diagnosis not present

## 2015-10-04 DIAGNOSIS — I4892 Unspecified atrial flutter: Secondary | ICD-10-CM | POA: Diagnosis not present

## 2015-10-04 DIAGNOSIS — D469 Myelodysplastic syndrome, unspecified: Secondary | ICD-10-CM | POA: Diagnosis not present

## 2015-10-04 LAB — COMPREHENSIVE METABOLIC PANEL
ALBUMIN: 3.1 g/dL — AB (ref 3.5–5.0)
ALK PHOS: 41 U/L (ref 38–126)
ALT: 13 U/L — AB (ref 17–63)
AST: 22 U/L (ref 15–41)
Anion gap: 12 (ref 5–15)
BUN: 53 mg/dL — AB (ref 6–20)
CALCIUM: 9.2 mg/dL (ref 8.9–10.3)
CO2: 27 mmol/L (ref 22–32)
CREATININE: 5.41 mg/dL — AB (ref 0.61–1.24)
Chloride: 100 mmol/L — ABNORMAL LOW (ref 101–111)
GFR calc Af Amer: 11 mL/min — ABNORMAL LOW (ref >60)
GFR calc non Af Amer: 10 mL/min — ABNORMAL LOW (ref >60)
GLUCOSE: 94 mg/dL (ref 65–99)
Potassium: 4.5 mmol/L (ref 3.5–5.1)
SODIUM: 139 mmol/L (ref 135–145)
Total Bilirubin: 1 mg/dL (ref 0.3–1.2)
Total Protein: 6.1 g/dL — ABNORMAL LOW (ref 6.5–8.1)

## 2015-10-04 LAB — CBC
HCT: 26.1 % — ABNORMAL LOW (ref 39.0–52.0)
HEMOGLOBIN: 8.8 g/dL — AB (ref 13.0–17.0)
MCH: 36.2 pg — ABNORMAL HIGH (ref 26.0–34.0)
MCHC: 33.7 g/dL (ref 30.0–36.0)
MCV: 107.4 fL — ABNORMAL HIGH (ref 78.0–100.0)
Platelets: 38 10*3/uL — ABNORMAL LOW (ref 150–400)
RBC: 2.43 MIL/uL — AB (ref 4.22–5.81)
WBC: 3.2 10*3/uL — AB (ref 4.0–10.5)

## 2015-10-04 LAB — PROTIME-INR
INR: 1.16 (ref 0.00–1.49)
PROTHROMBIN TIME: 15 s (ref 11.6–15.2)

## 2015-10-04 LAB — PREPARE PLATELET PHERESIS: UNIT DIVISION: 0

## 2015-10-04 MED ORDER — WARFARIN SODIUM 7.5 MG PO TABS
7.5000 mg | ORAL_TABLET | Freq: Once | ORAL | Status: AC
  Administered 2015-10-04: 7.5 mg via ORAL
  Filled 2015-10-04: qty 1

## 2015-10-04 MED ORDER — CARVEDILOL 3.125 MG PO TABS
3.1250 mg | ORAL_TABLET | Freq: Two times a day (BID) | ORAL | Status: DC
  Administered 2015-10-04 – 2015-10-05 (×3): 3.125 mg via ORAL
  Filled 2015-10-04 (×3): qty 1

## 2015-10-04 MED ORDER — LISINOPRIL 10 MG PO TABS
20.0000 mg | ORAL_TABLET | Freq: Two times a day (BID) | ORAL | Status: DC
  Administered 2015-10-04 – 2015-10-05 (×2): 20 mg via ORAL
  Filled 2015-10-04 (×2): qty 2

## 2015-10-04 MED ORDER — POLYETHYLENE GLYCOL 3350 17 G PO PACK
17.0000 g | PACK | Freq: Two times a day (BID) | ORAL | Status: DC
  Administered 2015-10-04 – 2015-10-05 (×2): 17 g via ORAL
  Filled 2015-10-04 (×3): qty 1

## 2015-10-04 MED ORDER — COUMADIN BOOK
Freq: Once | Status: AC
  Administered 2015-10-04: 10:00:00
  Filled 2015-10-04: qty 1

## 2015-10-04 NOTE — Progress Notes (Addendum)
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 Cardiology consultation placed for recommendations regarding new onset atrial flutter and systolic HF.   Assessment and plan Hypoxic respiratory failure secondary to acute on chronic systolic heart failure, new onset atrial flutter, improved, currently 96% on room air Chest x-ray  5/16 shows small bilateral pleural effusions Patient also has evidence of chronic systolic heart failure with EF of 25% on 2-D echo done on 07/15/15 This is compounded by anemia secondary to multiple myeloma  Repeat 2-D echo in 2-3 months to assess LV function  ESRD-dialyzed in [place] on Tuesday Thursday Saturday, as per nephrology  Hypertension-continue with Norvasc, lisinopril, stable  Multiple myeloma/anemia of chronic disease-severe anemia and thrombocytopenia-now transfusion dependent patient underwent CT-guided Bone marrow biopsy on 09/18/15. According to Dr Jonette Eva, patient has myelodysplasia Status post 2 units of packed red blood cells and 2 unit of platelets Hematology recommends to  continue prn transfusions   New onset atrial flutter now in normal sinus rhythm-seen by cardiology and EP, started patient on Coumadin after cardioversion , He will need to be on Coumadin for 2 weeks post ablation as per cardiology No heparin due to risk of  worsening thrombocytopenia If he starts bleeding, would need to stop  Coumadin, if patient unable to continue Coumadin and he understands the risk of stroke,TEE shows no LA thrombus, severe global hypokinesis with EF of 20%, no pericardial effusion Most likely would need to be monitored for bleeding, transfusions until INR is therapeutic as per hematology Patient would need regular INR and CBC with hemodialysis, need for transfusion and any ongoing bleeding needs to be assessed periodically during his hemodialysis sessions   Acute on chronic systolic heart failure-tachycardia induced cardiomyopathy? Continue ACE inhibitor, also introduce beta blocker Not a candidate for cardiac catheterization  as he would not be able to tolerate DAPT if PCI is needed   Anemia secondary to multiple myeloma, Improved after receiving packed red blood cells and platelets, continue to follow closely Hematology would like to sustain transfusions for now Has received a total of 17 platelets and 10 units of packed red blood cells in 2017   DVT prophylaxsis SCDs  Code Status:  Full code    Family Communication: Discussed in detail with the patient, all imaging results, lab results explained to the patient   Disposition Plan: Anticipate discharge tomorrow after hemodialysis, need to check INR, CBC with hemodialysis, need to assess  for bleeding issues closely in the outpatient setting  Consultants:  Cardiology  Nephrology  Hematology  Procedures:  None  Antibiotics: Anti-infectives    None         HPI/Subjective:  Denies any  chest pain or shortness of breath  Objective: Filed Vitals:   10/03/15 1208 10/03/15 1239 10/03/15 2139  10/04/15 0558  BP: 133/88 139/79 122/65 139/86  Pulse: 81 80 75 71  Temp:  97.6 F (36.4 C) 98.1 F (36.7 C) 97.8 F (36.6 C)  TempSrc:  Oral Oral Oral  Resp: 14 18 18 18   Height:      Weight:      SpO2: 100% 95% 95% 96%    Intake/Output Summary (Last 24 hours) at 10/04/15 0841 Last data filed at 10/03/15 1800  Gross per 24 hour  Intake    672 ml  Output    450 ml  Net    222 ml    Exam:  Examination:  General exam: Appears calm and comfortable  Respiratory system: Clear to auscultation. Respiratory effort normal. Cardiovascular system: S1 & S2 heard, RRR. No JVD, murmurs, rubs, gallops or clicks. No pedal edema. Gastrointestinal system: Abdomen is nondistended, soft and nontender. No organomegaly or masses felt. Normal bowel sounds heard. Central nervous system: Alert and oriented. No focal neurological deficits. Extremities: Symmetric 5 x 5 power. Skin: No rashes, lesions or ulcers Psychiatry: Judgement and insight appear normal. Mood & affect appropriate.     Data Reviewed: I have personally reviewed following labs and imaging studies  Micro Results Recent Results (from the past 240 hour(s))  MRSA PCR Screening     Status: None   Collection Time: 10/03/15  2:00 AM  Result Value Ref Range Status   MRSA by PCR NEGATIVE NEGATIVE Final    Comment:        The GeneXpert MRSA Assay (FDA approved for NASAL specimens only), is one component of a comprehensive MRSA colonization surveillance program. It is not intended to diagnose MRSA infection nor to guide or monitor treatment for MRSA infections.     Radiology Reports Dg Chest 2 View  10/01/2015  CLINICAL DATA:  70 year old male, dialysis patient, with shortness of breath. EXAM: CHEST  2 VIEW COMPARISON:  Chest radiograph dated 07/14/2015 FINDINGS: Small bilateral pleural effusions appear increased compared to prior study. Bibasilar densities may represent atelectatic changes or pneumonia. No pneumothorax. There  is stable cardiomegaly. Right pectoral central venous catheter with tip over central SVC. Right axillary vascular stent. There is osteopenia with degenerative changes of the spine. Lower thoracic vertebroplasty changes. IMPRESSION: Small bilateral pleural effusions as well as bibasilar atelectasis/ infiltrate, with interval progression compared to the prior study. Electronically Signed   By: Anner Crete M.D.   On: 10/01/2015 02:05   Ct Biopsy  09/18/2015  INDICATION: 70 year old male with a history of multiple myeloma. He has undergone prior bone marrow transplantation. Repeat bone marrow biopsy requested to evaluate for recurrence. EXAM: CT GUIDED BONE MARROW ASPIRATION AND CORE BIOPSY Interventional Radiologist:  Criselda Peaches, MD MEDICATIONS: None. ANESTHESIA/SEDATION: Moderate (conscious) sedation was employed during this procedure. A total of 1.5 milligrams versed and 50 micrograms fentanyl were administered intravenously. The patient's level of consciousness and vital signs were monitored continuously by radiology nursing throughout the procedure under my direct supervision. Total monitored sedation time: 10 minutes FLUOROSCOPY TIME:  None COMPLICATIONS: None immediate. Estimated blood loss: <25 mL PROCEDURE: Informed written consent was obtained from the patient after a thorough discussion of the procedural risks, benefits and alternatives. All questions were addressed. Maximal Sterile Barrier Technique was utilized including caps, mask, sterile gowns, sterile gloves, sterile drape, hand hygiene and skin antiseptic. A timeout was performed prior to the  initiation of the procedure. The patient was positioned prone and non-contrast localization CT was performed of the pelvis to demonstrate the iliac marrow spaces. Maximal barrier sterile technique utilized including caps, mask, sterile gowns, sterile gloves, large sterile drape, hand hygiene, and betadine prep. Under sterile conditions and local  anesthesia, an 11 gauge coaxial bone biopsy needle was advanced into the right iliac marrow space. Needle position was confirmed with CT imaging. Initially, bone marrow aspiration was performed. Next, the 11 gauge outer cannula was utilized to obtain a right iliac bone marrow core biopsy. Needle was removed. Hemostasis was obtained with compression. The patient tolerated the procedure well. Samples were prepared with the cytotechnologist. IMPRESSION: Technically successful CT-guided bone marrow biopsy. Electronically Signed   By: Jacqulynn Cadet M.D.   On: 09/18/2015 11:37   Dg Bone Density  09/09/2015  EXAM: DUAL X-RAY ABSORPTIOMETRY (DXA) FOR BONE MINERAL DENSITY IMPRESSION: Ordering Physician:  Dr. Baird Cancer, Your patient Ami Thornsberry completed a BMD test on 09/09/2015 using the [place] (software version: 14.10) manufactured by [employer]. The following summarizes the results of our evaluation. PATIENT BIOGRAPHICAL: Name: [name], [name] Patient ID: [id number] Birth Date: 1946-03-01 Height: 70.0 in. Gender: Male Exam Date: 09/09/2015 Weight: 234.0 lbs. Indications: Caucasian, Height Loss, History of Fracture (Adult), Low Calcium Intake, Renal Fractures: Toe Treatments: DENSITOMETRY RESULTS: Site      Region     Measured Date Measured Age WHO Classification Young Adult T-score BMD         %Change vs. Previous Significant Change (*) AP Spine L1-L3 11/09/[age] N/A 2.1 1.468 g/cm2 - - DualFemur Neck Right 09/09/2015 69.9 N/A -0.7 0.985 g/cm2 - - ASSESSMENT: This patient is considered normal by World Healh Organization (WHO) Criteria. (L-4 was excluded due to advanced degenerative changes.) World Health Organization [facility]) criteria for post-menopausal, Caucasian Women: Normal:       T-score at or above -1 SD Osteopenia:   T-score between -1 and -2.5 SD Osteoporosis: T-score at or below -2.5 SD RECOMMENDATIONS: [place] recommends that FDA-approved  medial therapies be considered in postmenopausal women and men age 35 or older with a: 1. Hip or vertberal (clinical or morphometric) fracture. 2. T-Score of < -2.5 at the spine or hip. 3. Ten-year fracture probability by FRAX of 3% or greater for hip fracture or 20% or greater for major osteoporotic fracture. All treatment decisions require clinical judgment and consideration of indiviual patient factors, including patient preferences, co-morbidities, previous drug use, risk factors not captured in the FRAX model (e.g. falls, vitamin D deficiency, increased bone turnover, interval significant decline in bone density) and possible under-or over-estimation of fracture risk by FRAX. All patients should ensure an adequate intake of dietary calcium (1200 mg/d) and vitamin D (800 IU daily) unless contraindicated. FOLLOW-UP: People with diagnosed cases of osteoporosis or osteopenia should be regularly tested for bone mineral density. For patients eligible for Medicare, routine testing is allowed once every 2 years. Testing frequency can be increased for patients who have rapidly progressing disease, or for those who are receiving medical therapy to restore bone mass. I have reviewed this report, and agree with the above findings. [facility] Electronically Signed   By: Earle Gell M.D.   On: 09/09/2015 11:18     CBC  Recent Labs Lab 10/01/15 0112 10/01/15 0138 10/01/15 1704 10/02/15 0447 10/03/15 0700 10/04/15 0525  WBC 3.5*  --  3.7* 3.5* 3.3* 3.2*  HGB 7.5* 8.5* 7.4*  7.2* 9.0* 8.8*  HCT 23.4* 25.0* 23.3* 22.1* 26.9* 26.1*  PLT 24*  --  21* 12* 27* 38*  MCV 118.8*  --  119.5* 116.9* 109.3* 107.4*  MCH 38.1*  --  37.9* 38.1* 36.6* 36.2*  MCHC 32.1  --  31.8 32.6 33.5 33.7  RDW 24.5*  --  23.7* 23.4*  --  NOT CALCULATED  LYMPHSABS 0.9  --   --   --   --   --   MONOABS 0.2  --   --   --   --   --   EOSABS 0.1  --   --   --   --   --   BASOSABS 0.0  --   --   --   --   --      Chemistries   Recent Labs Lab 10/01/15 0112 10/01/15 0138 10/01/15 1705 10/02/15 0447 10/03/15 0700 10/04/15 0525  NA 142 140 141 138 139 139  K 4.9 5.4* 5.0 4.1 4.2 4.5  CL 106 106 105 99* 98* 100*  CO2 26  --  25 29 29 27   GLUCOSE 122* 124* 137* 90 88 94  BUN 47* 60* 55* 30* 30* 53*  CREATININE 6.69* 6.70* 7.41* 4.50* 3.80* 5.41*  CALCIUM 10.0  --  9.8 8.9 8.7* 9.2  AST  --   --   --  15  --  22  ALT  --   --   --  13*  --  13*  ALKPHOS  --   --   --  45  --  41  BILITOT  --   --   --  1.2  --  1.0   ------------------------------------------------------------------------------------------------------------------ estimated creatinine clearance is 16.2 mL/min (by C-G formula based on Cr of 5.41). ------------------------------------------------------------------------------------------------------------------ No results for input(s): HGBA1C in the last 72 hours. ------------------------------------------------------------------------------------------------------------------ No results for input(s): CHOL, HDL, LDLCALC, TRIG, CHOLHDL, LDLDIRECT in the last 72 hours. ------------------------------------------------------------------------------------------------------------------ No results for input(s): TSH, T4TOTAL, T3FREE, THYROIDAB in the last 72 hours.  Invalid input(s): FREET3 ------------------------------------------------------------------------------------------------------------------ No results for input(s): VITAMINB12, FOLATE, FERRITIN, TIBC, IRON, RETICCTPCT in the last 72 hours.  Coagulation profile  Recent Labs Lab 10/04/15 0525  INR 1.16    No results for input(s): DDIMER in the last 72 hours.  Cardiac Enzymes  Recent Labs Lab 10/01/15 0350 10/01/15 0835 10/01/15 1554  TROPONINI 0.10* 0.11* 0.11*   ------------------------------------------------------------------------------------------------------------------ Invalid input(s):  POCBNP   CBG: No results for input(s): GLUCAP in the last 168 hours.     Studies: No results found.    No results found for: HGBA1C Lab Results  Component Value Date   LDLCALC 90 03/03/2013   CREATININE 5.41* 10/04/2015       Scheduled Meds: . sodium chloride   Intravenous Once  . sodium chloride   Intravenous Once  . sodium chloride   Intravenous Once  . amLODipine  5 mg Oral QHS  . calcium acetate  2,001 mg Oral TID WC  . docusate sodium  100 mg Oral BID  . furosemide  40 mg Oral Daily  . lisinopril  20 mg Oral QHS  . pregabalin  75 mg Oral Daily  . sodium chloride flush  3 mL Intravenous Q12H  . Warfarin - Pharmacist Dosing Inpatient   Does not apply q1800   Continuous Infusions: . sodium chloride          Time spent: >30 MINS    [facility] Pager [phone]. If 7PM-7AM, please  contact night-coverage at [email or web address], password [facility] 10/04/2015, 8:41 AM

## 2015-10-04 NOTE — Progress Notes (Signed)
Admit: 10/01/2015 LOS:   55M ESRD [facility] admitted after possible accidental overdose and found to have AoC sCHF exacerbation and Aflutter ablated 5/18  Subjective:  No new events   05/18 0701 - 05/19 0700 In: 702 [P.O.:480; Blood:222] Out: 842 [JIZXY:811]  WAQLR Weights   10/01/15 1920 10/02/15 0443 10/02/15 1320  Weight: 107 kg (235 lb 14.3 oz) 105.6 kg (232 lb 12.9 oz) 106.1 kg (233 lb 14.5 oz)    Scheduled Meds: . sodium chloride   Intravenous Once  . sodium chloride   Intravenous Once  . sodium chloride   Intravenous Once  . amLODipine  5 mg Oral QHS  . calcium acetate  2,001 mg Oral TID WC  . docusate sodium  100 mg Oral BID  . furosemide  40 mg Oral Daily  . lisinopril  20 mg Oral QHS  . pregabalin  75 mg Oral Daily  . sodium chloride flush  3 mL Intravenous Q12H  . warfarin  7.5 mg Oral ONCE-1800  . Warfarin - Pharmacist Dosing Inpatient   Does not apply q1800   Continuous Infusions: . sodium chloride     PRN Meds:.sodium chloride, acetaminophen, heparin, HYDROcodone-acetaminophen, ondansetron (ZOFRAN) IV, ondansetron, sodium chloride flush, sodium chloride flush  Current Labs: reviewed    Physical Exam:  Blood pressure 139/86, pulse 71, temperature 97.8 F (36.6 C), temperature source Oral, resp. rate 18, height 5' 11"  (1.803 m), weight 106.1 kg (233 lb 14.5 oz), SpO2 96 %. GEN: NAD ENT: NCAT EYES: EOMI CV: RRR PULM: Diminished in bases ABD: s/tn/nd, obese SKIN: some petechiae in LEs EXT:2+ Edema   A 1. ESRD:  1. [facility],  2. AVF 2. SOB/Pleural Effusions/Hypervolemia 3. AoC sCHF exacerbation, loss of LVEF 4. Atiral Flutter s/p albation 5/18 on warfarin 5. ? Overdose on lisinopril and amlopdine, BP stable -- resolved 6. Anemia in setting of tx for myeloma-  7. Chronic TCP 8. Multiple Myeloma  P 1. HD again tomorrow back on schedule      [name]   10/04/2015, 11:27 AM   Recent Labs Lab 10/01/15 1705  10/02/15 0447 10/03/15 0700 10/04/15 0525  NA 141 138 139 139  K 5.0 4.1 4.2 4.5  CL 105 99* 98* 100*  CO2 25 29 29 27   GLUCOSE 137* 90 88 94  BUN 55* 30* 30* 53*  CREATININE 7.41* 4.50* 3.80* 5.41*  CALCIUM 9.8 8.9 8.7* 9.2  PHOS 5.1*  --   --   --     Recent Labs Lab 10/01/15 0112  10/02/15 0447 10/03/15 0700 10/04/15 0525  WBC 3.5*  < > 3.5* 3.3* 3.2*  NEUTROABS 2.3  --   --   --   --   HGB 7.5*  < > 7.2* 9.0* 8.8*  HCT 23.4*  < > 22.1* 26.9* 26.1*  MCV 118.8*  < > 116.9* 109.3* 107.4*  PLT 24*  < > 12* 27* 38*  < > = values in this interval not displayed.

## 2015-10-04 NOTE — Progress Notes (Signed)
It looks like Mr. Dilmore cardiac ablation went well. He had no problems with the procedure. The TEE went well. There was no evidence of intracardiac thrombus.  He now is on Coumadin. Pharmacy is helping manage this.  He really looks better. His color looks better. He just feels better. Hopefully, he will stay in a normal sinus rhythm.  There is no CBC back yet.  He did receive some platelets yesterday prior to the TEE.  He does have the myelodysplasia. With his thrombocytopenia, his platelet function is probably not that great so I think he will be at risk for bleeding or matter what.  I guess he goes for dialysis tomorrow. He is not uremic so this would not affect platelet function and he great degree.  I think the INR should be kept around 2-3 at the most. Again pharmacy do a great job with this.  His vital signs are all stable. His blood pressure is 139/86. Pulse is 71. Temperature is 97.8. Hent exam shows no ocular or oral lesions. Lungs are relatively clear. Cardiac exam regular rate and rhythm. Abdomen is obese but soft. Bowel sounds are present. Skin exam shows no petechia. He may have some scattered small ecchymoses. Neurological exam shows no focal neurological deficits.  We will continue follow him along. I would be aggressive with transfusing him. Again with a better hemoglobin, this will help his coagulation parameters and help with decrease in the bleeding risk.  I'll make sure that his CBC checked daily.  I will continue pray for him.  Pete E.  Rodman Key 19:26

## 2015-10-04 NOTE — Progress Notes (Signed)
Patient Name: [name] Date of Encounter: 10/04/2015  Active Problems:   Essential hypertension, benign   ESRD on dialysis (HCC)   Elevated troponin   Hypoxia   Fluid overload   Atrial flutter with controlled response (HCC)   Systolic HF (heart failure) [facility])  Patient Profile: 70 y/o male with a complex medical history including h/o multiple myeloma s/p bone marrow transplant x 2, chemotherapy treatment with Revlimid/lenalidomide (with potential for cardiac toxicity), ESRD on HD (T, Th, Sat) with anemia of chronic disease/ severe anemia and thrombocytopenia-now transfusion dependent, admitted for suspected accidental overdose with antihypertensive meds, found to be tachypneic, tachycardic and hypoxic on arrival and in new onset atrial flutter. Had TEE and Aflutter ablation on 10/03/15  SUBJECTIVE: Feels well this am. No complaints.   OBJECTIVE Filed Vitals:   10/03/15 1208 10/03/15 1239 10/03/15 2139 10/04/15 0558  BP: 133/88 139/79 122/65 139/86  Pulse: 81 80 75 71  Temp:  97.6 F (36.4 C) 98.1 F (36.7 C) 97.8 F (36.6 C)  TempSrc:  Oral Oral Oral  Resp: 14 18 18 18   Height:      Weight:      SpO2: 100% 95% 95% 96%    Intake/Output Summary (Last 24 hours) at 10/04/15 0746 Last data filed at 10/03/15 1800  Gross per 24 hour  Intake    702 ml  Output    450 ml  Net    252 ml   Filed Weights   10/01/15 1920 10/02/15 0443 10/02/15 1320  Weight: 235 lb 14.3 oz (107 kg) 232 lb 12.9 oz (105.6 kg) 233 lb 14.5 oz (106.1 kg)    PHYSICAL EXAM General: Well developed, well nourished, male in no acute distress. Head: Normocephalic, atraumatic.  Neck: Supple without bruits, No JVD. Lungs:  Resp regular and unlabored, CTA. Heart: RRR, S1, S2, no S3, S4, or murmur; no rub. Abdomen: Soft, non-tender, non-distended, BS + x 4.  Extremities: No clubbing, cyanosis, No edema.  Neuro: Alert and oriented X 3. Moves all extremities spontaneously. Psych: Normal  affect.  LABS: CBC: Recent Labs  10/03/15 0700 10/04/15 0525  WBC 3.3* 3.2*  HGB 9.0* 8.8*  HCT 26.9* 26.1*  MCV 109.3* 107.4*  PLT 27* 38*   INR: Recent Labs  10/04/15 0525  INR 1.61   Basic Metabolic Panel: Recent Labs  10/01/15 1705  10/03/15 0700 10/04/15 0525  NA 141  < > 139 139  K 5.0  < > 4.2 4.5  CL 105  < > 98* 100*  CO2 25  < > 29 27  GLUCOSE 137*  < > 88 94  BUN 55*  < > 30* 53*  CREATININE 7.41*  < > 3.80* 5.41*  CALCIUM 9.8  < > 8.7* 9.2  PHOS 5.1*  --   --   --   < > = values in this interval not displayed. Liver Function Tests: Recent Labs  10/02/15 0447 10/04/15 0525  AST 15 22  ALT 13* 13*  ALKPHOS 45 41  BILITOT 1.2 1.0  PROT 6.1* 6.1*  ALBUMIN 3.1* 3.1*   Cardiac Enzymes: Recent Labs  10/01/15 0835 10/01/15 1554  TROPONINI 0.11* 0.11*     Current facility-administered medications:  .  0.9 %  sodium chloride infusion, , Intravenous, Once, Nayana Abrol, MD .  0.9 %  sodium chloride infusion, , Intravenous, Once, Reyne Dumas, MD .  0.9 %  sodium chloride infusion, , Intravenous, Once, Volanda Napoleon, MD, Last  Rate: 10 mL/hr at 10/02/15 2130 .  0.9 %  sodium chloride infusion, , Intravenous, Continuous, Mishika Flippen, MD .  0.9 %  sodium chloride infusion, 250 mL, Intravenous, PRN, Evans Lance, MD .  acetaminophen (TYLENOL) tablet 650 mg, 650 mg, Oral, Q4H PRN, Evans Lance, MD .  amLODipine (NORVASC) tablet 5 mg, 5 mg, Oral, QHS, Jared M Gardner, DO, 5 mg at 10/03/15 2142 .  calcium acetate (PHOSLO) capsule 2,001 mg, 2,001 mg, Oral, TID WC, Reyne Dumas, MD, 2,001 mg at 10/03/15 1823 .  docusate sodium (COLACE) capsule 100 mg, 100 mg, Oral, BID, Jared M Gardner, DO, 100 mg at 10/03/15 2142 .  furosemide (LASIX) tablet 40 mg, 40 mg, Oral, Daily, Jared M Gardner, DO, 40 mg at 10/02/15 1158 .  heparin injection 2,200 Units, 20 Units/kg, Dialysis, PRN, [name], MD .  HYDROcodone-acetaminophen (NORCO/VICODIN) 5-325 MG per  tablet 1 tablet, 1 tablet, Oral, Q6H PRN, Etta Quill, DO, 1 tablet at 10/03/15 1720 .  lisinopril (PRINIVIL,ZESTRIL) tablet 20 mg, 20 mg, Oral, QHS, Jared M Gardner, DO, 20 mg at 10/03/15 2142 .  ondansetron (ZOFRAN) injection 4 mg, 4 mg, Intravenous, Q6H PRN, Evans Lance, MD .  ondansetron [facility]) tablet 8 mg, 8 mg, Oral, Q8H PRN, Etta Quill, DO .  pregabalin (LYRICA) capsule 75 mg, 75 mg, Oral, Daily, Etta Quill, DO, 75 mg at 10/02/15 1158 .  sodium chloride flush (NS) 0.9 % injection 10-40 mL, 10-40 mL, Intracatheter, PRN, Reyne Dumas, MD, 10 mL at 10/03/15 1506 .  sodium chloride flush (NS) 0.9 % injection 3 mL, 3 mL, Intravenous, Q12H, Evans Lance, MD, 3 mL at 10/03/15 2149 .  sodium chloride flush (NS) 0.9 % injection 3 mL, 3 mL, Intravenous, PRN, Evans Lance, MD .  Warfarin - Pharmacist Dosing Inpatient, , Does not apply, q1800, Lauren D Bajbus, RPH . sodium chloride        TELE: NSR       ECG: NSR   Current Medications:  . sodium chloride   Intravenous Once  . sodium chloride   Intravenous Once  . sodium chloride   Intravenous Once  . amLODipine  5 mg Oral QHS  . calcium acetate  2,001 mg Oral TID WC  . docusate sodium  100 mg Oral BID  . furosemide  40 mg Oral Daily  . lisinopril  20 mg Oral QHS  . pregabalin  75 mg Oral Daily  . sodium chloride flush  3 mL Intravenous Q12H  . Warfarin - Pharmacist Dosing Inpatient   Does not apply q1800   . sodium chloride      ASSESSMENT AND PLAN: Active Problems:   Essential hypertension, benign   ESRD on dialysis (HCC)   Elevated troponin   Hypoxia   Fluid overload   Atrial flutter with controlled response (HCC)   Systolic HF (heart failure) ([place])  1. Atrial Flutter: s/p TEE and atrial flutter ablation yesterday, he tolerated the procedure well. In NSR this am.   2. Chronic thrombocytopenia: Platelets 38,000 this am. He will need to be on Coumadin for 2 weeks post ablations,  INR goal is 2-3.  Hematology is following.   3. Systolic HF: EF by echo 0/9811 was reduced down to 25% with diffuse hypokinesis, compared to normal EFs in 2009 and 2015, previously 50-60%. Several potential etiologies may be responsible for new LV dysfunction, including chronic anemia, medication induced from chemotherapy agent vs tachy mediated from  potential PAF vs ischemic etiology. Given his multiple comorbidities, including severe anemia and thrombocytopenia-now transfusion dependent, he would not be a candidate for cardiac catheterization, as he would not be able to tolerate DAPT if PCI is needed.  4. Elevated Troponin: low level with flat trend, in the setting of CKD and atrial flutter. He denies CP. As outlined above, would not favor aggressive ischemic w/u given multiple co morbidities.   5. Hypertension: BP is labile, pt is on ACEI and amlodipine now.   6. ESRD on HD: Nephrology following, pt had HD yesterday.     Signed, Arbutus Leas , NP 7:46 AM 10/04/2015 Pager [phone]  I have seen and examined the patient along with Arbutus Leas , NP.  I have reviewed the chart, notes and new data.  I agree with NP's note.  Key new complaints: no bleeding at cath access site Key examination changes: regular rhythm, no overt hypervolemia Key new findings / data: maintaining NSR on monitor  PLAN: Warfarin for 2-3 weeks. Note pretty good appendage emptying velocities at TEE, hopefully good atrial mechanical function will return promptly. Stop amlodipine, optimize ACEi dose, add low dose carvedilol. Note Dr. Antonieta Pert recommendation: "I would be aggressive with transfusing him. Again with a better hemoglobin, this will help his coagulation parameters and help with decrease in the bleeding risk." Appreciate his help. Reevaluate LVEF in 2-3 months. 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 Although CAD may be present, we have chosen to forego  evaluation, as he is not a candidate for either surgical or percutaneous revascularization.   Sanda Klein, MD, [place] [phone] 10/04/2015, 10:49 AM

## 2015-10-04 NOTE — Progress Notes (Signed)
Per direction of Physician advisor- pt does not meet continued Observation stay due to lack of intensity of services. INR can be monitored on an outpt basis. Advised to give pt ABN (Advance Beneficiary Notice of Noncoverage)- ABN given to pt at bedside- per pt he does not want to continue to stay in hospital and would prefer to discharge tomorrow-copy of ABN placed on shadow chart.  MD please consider D/C on 10/05/15.

## 2015-10-04 NOTE — Progress Notes (Signed)
[place] for warfarin Indication: atrial fibrillation/flutter  Allergies  Allergen Reactions  . Pamidronate Anaphylaxis    blood pressure     Patient Measurements: Height: 5' 11"  (180.3 cm) Weight: 233 lb 14.5 oz (106.1 kg) IBW/kg (Calculated) : 75.3   Vital Signs: Temp: 97.8 F (36.6 C) (05/19 0558) Temp Source: Oral (05/19 0558) BP: 139/86 mmHg (05/19 0558) Pulse Rate: 71 (05/19 0558)  Labs:  Recent Labs  10/01/15 1554  10/02/15 0447 10/03/15 0700 10/04/15 0525  HGB  --   < > 7.2* 9.0* 8.8*  HCT  --   < > 22.1* 26.9* 26.1*  PLT  --   < > 12* 27* 38*  LABPROT  --   --   --   --  15.0  INR  --   --   --   --  1.16  CREATININE  --   < > 4.50* 3.80* 5.41*  TROPONINI 0.11*  --   --   --   --   < > = values in this interval not displayed.  Estimated Creatinine Clearance: 16.2 mL/min (by C-G formula based on Cr of 5.41).   Assessment: 6 YOM with extensive PMH including multiple myeloma and chronic anemia and thrombocytopenia. Followed by heme/onc and requires PRN transfusions. With new onset AFlutter, to start warfarin post-ablation without heparin or Lovenox bridge d/t high risk of bleeding. INR 1.16 hgb 8.8, plts 38 today. No bleeding noted.  Goal of Therapy:  INR 2-3 Monitor platelets by anticoagulation protocol: Yes   Plan:  -warfarin 7.10m po x1 tonight -daily INR and CBC -follow very closely for s/s/ bleeding  Tayson Schnelle D. Neils Siracusa, PharmD, BCPS Clinical Pharmacist Pager: [phone]/19/2017 9:54 AM b

## 2015-10-05 DIAGNOSIS — I132 Hypertensive heart and chronic kidney disease with heart failure and with stage 5 chronic kidney disease, or end stage renal disease: Secondary | ICD-10-CM | POA: Diagnosis not present

## 2015-10-05 DIAGNOSIS — Z992 Dependence on renal dialysis: Secondary | ICD-10-CM | POA: Diagnosis not present

## 2015-10-05 DIAGNOSIS — R7989 Other specified abnormal findings of blood chemistry: Secondary | ICD-10-CM | POA: Diagnosis not present

## 2015-10-05 DIAGNOSIS — N186 End stage renal disease: Secondary | ICD-10-CM | POA: Diagnosis not present

## 2015-10-05 DIAGNOSIS — I4892 Unspecified atrial flutter: Secondary | ICD-10-CM | POA: Diagnosis not present

## 2015-10-05 DIAGNOSIS — I5023 Acute on chronic systolic (congestive) heart failure: Secondary | ICD-10-CM | POA: Diagnosis not present

## 2015-10-05 LAB — CBC
HCT: 26.9 % — ABNORMAL LOW (ref 39.0–52.0)
Hemoglobin: 8.9 g/dL — ABNORMAL LOW (ref 13.0–17.0)
MCH: 35.5 pg — AB (ref 26.0–34.0)
MCHC: 33.1 g/dL (ref 30.0–36.0)
MCV: 107.2 fL — AB (ref 78.0–100.0)
PLATELETS: 26 10*3/uL — AB (ref 150–400)
RBC: 2.51 MIL/uL — ABNORMAL LOW (ref 4.22–5.81)
WBC: 3.1 10*3/uL — ABNORMAL LOW (ref 4.0–10.5)

## 2015-10-05 LAB — COMPREHENSIVE METABOLIC PANEL
ALBUMIN: 3.2 g/dL — AB (ref 3.5–5.0)
ALT: 13 U/L — ABNORMAL LOW (ref 17–63)
ANION GAP: 12 (ref 5–15)
AST: 15 U/L (ref 15–41)
Alkaline Phosphatase: 55 U/L (ref 38–126)
BUN: 71 mg/dL — AB (ref 6–20)
CHLORIDE: 100 mmol/L — AB (ref 101–111)
CO2: 27 mmol/L (ref 22–32)
Calcium: 10 mg/dL (ref 8.9–10.3)
Creatinine, Ser: 6.66 mg/dL — ABNORMAL HIGH (ref 0.61–1.24)
GFR calc Af Amer: 9 mL/min — ABNORMAL LOW (ref >60)
GFR calc non Af Amer: 8 mL/min — ABNORMAL LOW (ref >60)
GLUCOSE: 102 mg/dL — AB (ref 65–99)
POTASSIUM: 4.8 mmol/L (ref 3.5–5.1)
Sodium: 139 mmol/L (ref 135–145)
Total Bilirubin: 0.8 mg/dL (ref 0.3–1.2)
Total Protein: 6.4 g/dL — ABNORMAL LOW (ref 6.5–8.1)

## 2015-10-05 LAB — PROTIME-INR
INR: 1.34 (ref 0.00–1.49)
PROTHROMBIN TIME: 16.7 s — AB (ref 11.6–15.2)

## 2015-10-05 MED ORDER — POLYETHYLENE GLYCOL 3350 17 G PO PACK: 17.0000 g | PACK | Freq: Two times a day (BID) | ORAL | Status: AC

## 2015-10-05 MED ORDER — WARFARIN SODIUM 7.5 MG PO TABS
7.5000 mg | ORAL_TABLET | Freq: Once | ORAL | Status: AC
  Administered 2015-10-05: 7.5 mg via ORAL
  Filled 2015-10-05 (×2): qty 1

## 2015-10-05 MED ORDER — CARVEDILOL 3.125 MG PO TABS: 3.1250 mg | ORAL_TABLET | Freq: Two times a day (BID) | ORAL | Status: AC

## 2015-10-05 MED ORDER — WARFARIN SODIUM 2.5 MG PO TABS: ORAL_TABLET | ORAL | Status: DC

## 2015-10-05 MED ORDER — HEPARIN SOD (PORK) LOCK FLUSH 100 UNIT/ML IV SOLN: 500.0000 [IU] | INTRAVENOUS | Status: DC | PRN

## 2015-10-05 NOTE — Progress Notes (Signed)
Pt returned to floor from HD, currently in atrial fibrillation rate in 70s, he was in normal sinus this morning prior to dialysis.  Dr. Maryland Pink is aware, has spoken with cardiology.  They have asked to see how pt tolerates ambulation, if rate remains controlled he can be discharged to home and follow up out patient with cardiology.

## 2015-10-05 NOTE — Progress Notes (Addendum)
Pt unable to find a ride home.  [facility] notified and cab voucher given to patient.  Discharge instructions reviewed and pt verbalized understanding.  Pt states he would prefer to take his PM BP medications at home tonight.  IV team deaccessed patient's port a cath.  Pt taken to hospital entrance and taken home in cab.  Pt states he has no further questions at this time.  Claudette Stapler, RN

## 2015-10-05 NOTE — Progress Notes (Signed)
CRITICAL VALUE ALERT  Critical value received:  Platelets 26  Date of notification:  10/05/15  Time of notification:  0652  Critical value read back:Yes.    Nurse who received alert:  D. Susann Givens, RN  MD notified (1st page):  Rogue Bussing  Time of first page:  [phone]  MD notified (2nd page):  Time of second page:  Responding MD:  callahan  Time MD responded:  [phone]

## 2015-10-05 NOTE — Progress Notes (Signed)
Pt assures me that he will have a ride home tonight, he has two brothers and one will be here tonight.  IV team paged to deaccess port and telemetry removed, CCMD notified.  Report for discharge handed off to nurse LB

## 2015-10-05 NOTE — Discharge Instructions (Signed)
Information on my medicine - Coumadin®   (Warfarin) ° °This medication education was reviewed with me or my healthcare representative as part of my discharge preparation.   ° °Why was Coumadin prescribed for you? °Coumadin was prescribed for you because you have a blood clot or a medical condition that can cause an increased risk of forming blood clots. Blood clots can cause serious health problems by blocking the flow of blood to the heart, lung, or brain. Coumadin can prevent harmful blood clots from forming. °As a reminder your indication for Coumadin is:   Stroke Prevention Because Of Atrial Fibrillation ° °What test will check on my response to Coumadin? °While on Coumadin (warfarin) you will need to have an INR test regularly to ensure that your dose is keeping you in the desired range. The INR (international normalized ratio) number is calculated from the result of the laboratory test called prothrombin time (PT). ° °If an INR APPOINTMENT HAS NOT ALREADY BEEN MADE FOR YOU please schedule an appointment to have this lab work done by your health care provider within 7 days. °Your INR goal is usually a number between:  2 to 3 or your provider may give you a more narrow range like 2-2.5.  Ask your health care provider during an office visit what your goal INR is. ° °What  do you need to  know  About  COUMADIN? °Take Coumadin (warfarin) exactly as prescribed by your healthcare provider about the same time each day.  DO NOT stop taking without talking to the doctor who prescribed the medication.  Stopping without other blood clot prevention medication to take the place of Coumadin may increase your risk of developing a new clot or stroke.  Get refills before you run out. ° °What do you do if you miss a dose? °If you miss a dose, take it as soon as you remember on the same day then continue your regularly scheduled regimen the next day.  Do not take two doses of Coumadin at the same time. ° °Important Safety  Information °A possible side effect of Coumadin (Warfarin) is an increased risk of bleeding. You should call your healthcare provider right away if you experience any of the following: °? Bleeding from an injury or your nose that does not stop. °? Unusual colored urine (red or dark brown) or unusual colored stools (red or black). °? Unusual bruising for unknown reasons. °? A serious fall or if you hit your head (even if there is no bleeding). ° °Some foods or medicines interact with Coumadin® (warfarin) and might alter your response to warfarin. To help avoid this: °? Eat a balanced diet, maintaining a consistent amount of Vitamin K. °? Notify your provider about major diet changes you plan to make. °? Avoid alcohol or limit your intake to 1 drink for women and 2 drinks for men per day. °(1 drink is 5 oz. wine, 12 oz. beer, or 1.5 oz. liquor.) ° °Make sure that ANY health care provider who prescribes medication for you knows that you are taking Coumadin (warfarin).  Also make sure the healthcare provider who is monitoring your Coumadin knows when you have started a new medication including herbals and non-prescription products. ° °Coumadin® (Warfarin)  Major Drug Interactions  °Increased Warfarin Effect Decreased Warfarin Effect  °Alcohol (large quantities) °Antibiotics (esp. Septra/Bactrim, Flagyl, Cipro) °Amiodarone (Cordarone) °Aspirin (ASA) °Cimetidine (Tagamet) °Megestrol (Megace) °NSAIDs (ibuprofen, naproxen, etc.) °Piroxicam (Feldene) °Propafenone (Rythmol SR) °Propranolol (Inderal) °Isoniazid (INH) °Posaconazole (Noxafil) Barbiturates (Phenobarbital) °  Carbamazepine (Tegretol) Chlordiazepoxide (Librium) Cholestyramine (Questran) Griseofulvin Oral Contraceptives Rifampin Sucralfate (Carafate) Vitamin K   Coumadin (Warfarin) Major Herbal Interactions  Increased Warfarin Effect Decreased Warfarin Effect  Garlic Ginseng Ginkgo biloba Coenzyme Q10 Green tea St. Johns wort    Coumadin (Warfarin)  FOOD Interactions  Eat a consistent number of servings per week of foods HIGH in Vitamin K (1 serving =  cup)  Collards (cooked, or boiled & drained) Kale (cooked, or boiled & drained) Mustard greens (cooked, or boiled & drained) Parsley *serving size only =  cup Spinach (cooked, or boiled & drained) Swiss chard (cooked, or boiled & drained) Turnip greens (cooked, or boiled & drained)  Eat a consistent number of servings per week of foods MEDIUM-HIGH in Vitamin K (1 serving = 1 cup)  Asparagus (cooked, or boiled & drained) Broccoli (cooked, boiled & drained, or raw & chopped) Brussel sprouts (cooked, or boiled & drained) *serving size only =  cup Lettuce, raw (green leaf, endive, romaine) Spinach, raw Turnip greens, raw & chopped   These websites have more information on Coumadin (warfarin):  FailFactory.se; VeganReport.com.au;   Atrial Flutter Atrial flutter is a heart rhythm that can cause the heart to beat very fast (tachycardia). It originates in the upper chambers of the heart (atria). In atrial flutter, the top chambers of the heart (atria) often beat much faster than the bottom chambers of the heart (ventricles). Atrial flutter has a regular "saw toothed" appearance in an EKG readout. An EKG is a test that records the electrical activity of the heart. Atrial flutter can cause the heart to beat up to 150 beats per minute (BPM). Atrial flutter can either be short lived (paroxysmal) or permanent.  CAUSES  Causes of atrial flutter can be many. Some of these include:  Heart related issues:  Heart attack (myocardial infarction).  Heart failure.  Heart valve problems.  Poorly controlled high blood pressure (hypertension).  After open heart surgery.  Lung related issues:  A blood clot in the lungs (pulmonary embolism).  Chronic obstructive pulmonary disease (COPD). Medications used to treat COPD can attribute to atrial flutter.  Other related  causes:  Hyperthyroidism.  Caffeine.  Some decongestant cold medications.  Low electrolyte levels such as potassium or magnesium.  Cocaine. SYMPTOMS  An awareness of your heart beating rapidly (palpitations).  Shortness of breath.  Chest pain.  Low blood pressure (hypotension).  Dizziness or fainting. DIAGNOSIS  Different tests can be performed to diagnose atrial flutter.   An EKG.  Holter monitor. This is a 24-hour recording of your heart rhythm. You will also be given a diary. Write down all symptoms that you have and what you were doing at the time you experienced symptoms.  Cardiac event monitor. This small device can be worn for up to 30 days. When you have heart symptoms, you will push a button on the device. This will then record your heart rhythm.  Echocardiogram. This is an imaging test to look at your heart. Your caregiver will look at your heart valves and the ventricles.  Stress test. This test can help determine if the atrial flutter is related to exercise or if coronary artery disease is present.  Laboratory studies will look at certain blood levels like:  Complete blood count (CBC).  Potassium.  Magnesium.  Thyroid function. TREATMENT  Treatment of atrial flutter varies. A combination of therapies may be used or sometimes atrial flutter may need only 1 type of treatment.  Lab work: If your blood  work, such as your electrolytes (potassium, magnesium) or your thyroid function tests, are abnormal, your caregiver will treat them accordingly.  Medication:  There are several different types of medications that can convert your heart to a normal rhythm and prevent atrial flutter from reoccurring.  Nonsurgical procedures: Nonsurgical techniques may be used to control atrial flutter. Some examples include:  Cardioversion. This technique uses either drugs or an electrical shock to restore a normal heart rhythm:  Cardioversion drugs may be given through an  intravenous (IV) line to help "reset" the heart rhythm.  In electrical cardioversion, your caregiver shocks your heart with electrical energy. This helps to reset the heartbeat to a normal rhythm.  Ablation. If atrial flutter is a persistent problem, an ablation may be needed. This procedure is done under mild sedation. High frequency radio-wave energy is used to destroy the area of heart tissue responsible for atrial flutter. SEEK IMMEDIATE MEDICAL CARE IF:  You have:  Dizziness.  Near fainting or fainting.  Shortness of breath.  Chest pain or pressure.  Sudden nausea or vomiting.  Profuse sweating. If you have the above symptoms, call your local emergency service immediately! Do not drive yourself to the hospital. MAKE SURE YOU:   Understand these instructions.  Will watch your condition.  Will get help right away if you are not doing well or get worse.   This information is not intended to replace advice given to you by your health care provider. Make sure you discuss any questions you have with your health care provider.   Document Released: 09/20/2008 Document Revised: 05/25/2014 Document Reviewed: 11/16/2014 Elsevier Interactive Patient Education [employer].

## 2015-10-05 NOTE — Progress Notes (Signed)
Patient ID: RONDELL [name], male   DOB: Feb 15, 1946, 70 y.o.   MRN: [id number]     Patient Name: [name] Date of Encounter: 10/05/2015  Active Problems:   Essential hypertension, benign   ESRD on dialysis (HCC)   Elevated troponin   Hypoxia   Fluid overload   Atrial flutter with controlled response (HCC)   Systolic HF (heart failure) [facility])  Patient Profile: 70 y/o male with a complex medical history including h/o multiple myeloma s/p bone marrow transplant x 2, chemotherapy treatment with Revlimid/lenalidomide (with potential for cardiac toxicity), ESRD on HD (T, Th, Sat) with anemia of chronic disease/ severe anemia and thrombocytopenia-now transfusion dependent, admitted for suspected accidental overdose with antihypertensive meds, found to be tachypneic, tachycardic and hypoxic on arrival and in new onset atrial flutter. Had TEE and Aflutter ablation on 10/03/15  SUBJECTIVE: Lethargic no chest pain or dysonea   OBJECTIVE Filed Vitals:   10/04/15 0558 10/04/15 1655 10/04/15 2028 10/05/15 0509  BP: 139/86 146/85 151/86 148/89  Pulse: 71 69 74 59  Temp: 97.8 F (36.6 C) 97.5 F (36.4 C) 98 F (36.7 C) 97.8 F (36.6 C)  TempSrc: Oral Oral Oral Oral  Resp: 18 18 18 19   Height:      Weight:      SpO2: 96% 98% 96% 97%    Intake/Output Summary (Last 24 hours) at 10/05/15 0958 Last data filed at 10/05/15 0800  Gross per 24 hour  Intake    360 ml  Output    350 ml  Net     10 ml   Filed Weights   10/01/15 1920 10/02/15 0443 10/02/15 1320  Weight: 107 kg (235 lb 14.3 oz) 105.6 kg (232 lb 12.9 oz) 106.1 kg (233 lb 14.5 oz)    PHYSICAL EXAM General: Well developed, well nourished, male in no acute distress. Head: Normocephalic, atraumatic.  Neck: Supple without bruits, No JVD. Lungs:  Resp regular and unlabored, CTA. Heart: RRR, S1, S2, no S3, S4, or murmur; no rub. Abdomen: Soft, non-tender, non-distended, BS + x 4.  Extremities: No clubbing, cyanosis, No edema.    Neuro: Alert and oriented X 3. Moves all extremities spontaneously. Psych: Normal affect.  LABS: CBC:  Recent Labs  10/04/15 0525 10/05/15 0440  WBC 3.2* 3.1*  HGB 8.8* 8.9*  HCT 26.1* 26.9*  MCV 107.4* 107.2*  PLT 38* 26*   INR:  Recent Labs  10/05/15 0440  INR 8.09   Basic Metabolic Panel:  Recent Labs  10/04/15 0525 10/05/15 0440  NA 139 139  K 4.5 4.8  CL 100* 100*  CO2 27 27  GLUCOSE 94 102*  BUN 53* 71*  CREATININE 5.41* 6.66*  CALCIUM 9.2 10.0   Liver Function Tests:  Recent Labs  10/04/15 0525 10/05/15 0440  AST 22 15  ALT 13* 13*  ALKPHOS 41 55  BILITOT 1.0 0.8  PROT 6.1* 6.4*  ALBUMIN 3.1* 3.2*   Cardiac Enzymes:No results for input(s): CKTOTAL, CKMB, CKMBINDEX, TROPONINI in the last 72 hours.   Current facility-administered medications:  .  0.9 %  sodium chloride infusion, 250 mL, Intravenous, PRN, Evans Lance, MD .  acetaminophen (TYLENOL) tablet 650 mg, 650 mg, Oral, Q4H PRN, Evans Lance, MD .  calcium acetate (PHOSLO) capsule 2,001 mg, 2,001 mg, Oral, TID WC, Reyne Dumas, MD, 2,001 mg at 10/05/15 0802 .  carvedilol (COREG) tablet 3.125 mg, 3.125 mg, Oral, BID WC, Mihai Croitoru, MD, 3.125 mg at 10/05/15 0801 .  docusate sodium (COLACE) capsule 100 mg, 100 mg, Oral, BID, Etta Quill, DO, 100 mg at 10/05/15 0801 .  furosemide (LASIX) tablet 40 mg, 40 mg, Oral, Daily, Etta Quill, DO, 40 mg at 10/05/15 0801 .  heparin injection 2,200 Units, 20 Units/kg, Dialysis, PRN, [name], MD .  HYDROcodone-acetaminophen (NORCO/VICODIN) 5-325 MG per tablet 1 tablet, 1 tablet, Oral, Q6H PRN, Etta Quill, DO, 1 tablet at 10/04/15 2206 .  lisinopril (PRINIVIL,ZESTRIL) tablet 20 mg, 20 mg, Oral, BID, Mihai Croitoru, MD, 20 mg at 10/05/15 0802 .  ondansetron (ZOFRAN) injection 4 mg, 4 mg, Intravenous, Q6H PRN, Evans Lance, MD .  ondansetron [facility]) tablet 8 mg, 8 mg, Oral, Q8H PRN, Etta Quill, DO .  polyethylene glycol  (MIRALAX / GLYCOLAX) packet 17 g, 17 g, Oral, BID, Reyne Dumas, MD, 17 g at 10/05/15 0801 .  pregabalin (LYRICA) capsule 75 mg, 75 mg, Oral, Daily, Etta Quill, DO, 75 mg at 10/05/15 0801 .  sodium chloride flush (NS) 0.9 % injection 10-40 mL, 10-40 mL, Intracatheter, PRN, Reyne Dumas, MD, 10 mL at 10/03/15 1506 .  sodium chloride flush (NS) 0.9 % injection 3 mL, 3 mL, Intravenous, Q12H, Evans Lance, MD, 3 mL at 10/04/15 2206 .  sodium chloride flush (NS) 0.9 % injection 3 mL, 3 mL, Intravenous, PRN, Evans Lance, MD .  Warfarin - Pharmacist Dosing Inpatient, , Does not apply, q1800, Lauren D Bajbus, RPH      TELE: NSR     10/05/2015    ECG: NSR post ablation    Current Medications:  . calcium acetate  2,001 mg Oral TID WC  . carvedilol  3.125 mg Oral BID WC  . docusate sodium  100 mg Oral BID  . furosemide  40 mg Oral Daily  . lisinopril  20 mg Oral BID  . polyethylene glycol  17 g Oral BID  . pregabalin  75 mg Oral Daily  . sodium chloride flush  3 mL Intravenous Q12H  . Warfarin - Pharmacist Dosing Inpatient   Does not apply q1800      ASSESSMENT AND PLAN: Active Problems:   Essential hypertension, benign   ESRD on dialysis (HCC)   Elevated troponin   Hypoxia   Fluid overload   Atrial flutter with controlled response (HCC)   Systolic HF (heart failure) ([place])  1. Atrial Flutter: s/p TEE and atrial flutter ablation  he tolerated the procedure well. In NSR this am.   2. Chronic thrombocytopenia: Platelets  26 this am. He will need to be on Coumadin for 2 weeks post ablations,  INR goal is 2-3. Hematology is following.  Lab Results  Component Value Date   INR 1.34 10/05/2015   INR 1.16 10/04/2015   INR 1.15 09/18/2015     3. Systolic HF: EF by echo 10/4156 was reduced down to 25% with diffuse hypokinesis, compared to normal EFs in 2009 and 2015, previously 50-60%. Several potential etiologies may be responsible for new LV dysfunction, including chronic  anemia, medication induced from chemotherapy agent vs tachy mediated from potential PAF vs ischemic etiology. Given his multiple comorbidities, including severe anemia and thrombocytopenia-now transfusion dependent, he would not be a candidate for cardiac catheterization, as he would not be able to tolerate DAPT if PCI is needed.  4. Elevated Troponin: low level with flat trend, in the setting of CKD and atrial flutter. He denies CP. As outlined above, would not favor aggressive ischemic w/u given  multiple co morbidities.   5. Hypertension: BP is labile, pt is on ACEI and amlodipine now.   6. ESRD on HD: Nephrology following, ? Dialysis today     Jenkins Rouge

## 2015-10-05 NOTE — Progress Notes (Signed)
[facility] for warfarin Indication: atrial fibrillation/flutter  Allergies  Allergen Reactions  . Pamidronate Anaphylaxis    blood pressure     Patient Measurements: Height: 5' 11"  (180.3 cm) Weight: 230 lb 6.1 oz (104.5 kg) IBW/kg (Calculated) : 75.3   Vital Signs: Temp: 98.5 F (36.9 C) (05/20 1120) Temp Source: Oral (05/20 1120) BP: 116/75 mmHg (05/20 1230) Pulse Rate: 58 (05/20 1230)  Labs:  Recent Labs  10/03/15 0700 10/04/15 0525 10/05/15 0440  HGB 9.0* 8.8* 8.9*  HCT 26.9* 26.1* 26.9*  PLT 27* 38* 26*  LABPROT  --  15.0 16.7*  INR  --  1.16 1.34  CREATININE 3.80* 5.41* 6.66*    Estimated Creatinine Clearance: 13.1 mL/min (by C-G formula based on Cr of 6.66).   Assessment: 46 YOM with extensive PMH including multiple myeloma and chronic anemia and thrombocytopenia. Followed by heme/onc and requires PRN transfusions. With new onset AFlutter, to start warfarin post-ablation without heparin or Lovenox bridge d/t high risk of bleeding. INR 1.16>1.34, CBC stable.   Goal of Therapy:  INR 2-3 Monitor platelets by anticoagulation protocol: Yes   Plan:  -warfarin 7.39m po x1 tonight -daily INR and CBC -follow very closely for s/s/ bleeding  NAngela Burke PharmD Pharmacy Resident Pager: [phone] 10/05/2015 12:51 PM

## 2015-10-05 NOTE — Procedures (Signed)
I was present at this dialysis session. I have reviewed the session itself and made appropriate changes.   Tolerating well.  3L UF goal.     Recent Labs Lab 10/01/15 1705  10/05/15 0440  NA 141  < > 139  K 5.0  < > 4.8  CL 105  < > 100*  CO2 25  < > 27  GLUCOSE 137*  < > 102*  BUN 55*  < > 71*  CREATININE 7.41*  < > 6.66*  CALCIUM 9.8  < > 10.0  PHOS 5.1*  --   --   < > = values in this interval not displayed.   Recent Labs Lab 10/01/15 0112  10/03/15 0700 10/04/15 0525 10/05/15 0440  WBC 3.5*  < > 3.3* 3.2* 3.1*  NEUTROABS 2.3  --   --   --   --   HGB 7.5*  < > 9.0* 8.8* 8.9*  HCT 23.4*  < > 26.9* 26.1* 26.9*  MCV 118.8*  < > 109.3* 107.4* 107.2*  PLT 24*  < > 27* 38* 26*  < > = values in this interval not displayed.  Scheduled Meds: . calcium acetate  2,001 mg Oral TID WC  . carvedilol  3.125 mg Oral BID WC  . docusate sodium  100 mg Oral BID  . furosemide  40 mg Oral Daily  . lisinopril  20 mg Oral BID  . polyethylene glycol  17 g Oral BID  . pregabalin  75 mg Oral Daily  . sodium chloride flush  3 mL Intravenous Q12H  . Warfarin - Pharmacist Dosing Inpatient   Does not apply q1800   Continuous Infusions:  PRN Meds:.sodium chloride, acetaminophen, heparin, HYDROcodone-acetaminophen, ondansetron (ZOFRAN) IV, ondansetron, sodium chloride flush, sodium chloride flush   Pearson Grippe  MD 10/05/2015, 12:14 PM

## 2015-10-05 NOTE — Progress Notes (Signed)
Pt informed that he has been cleared to be discharged to home and will be following up with cardiology in out patient setting.  Pt expresses concern about being discharged to home.  He lives alone and states he doesn't "feel good".  States that he hurts and that he feels weak.  He also states that he does not have a ride home.  Dr. Maryland Pink notified of pt's concerns but cannot medically justify keeping him an additional night.  The order to discharge stands.

## 2015-10-05 NOTE — Discharge Summary (Signed)
[facility]  Physician Discharge Summary   Patient ID: [name] MRN: [id number] DOB/AGE: [birth year] 70 y.o.  Admit date: 10/01/2015 Discharge date: 10/05/2015  PCP: No PCP Per Patient  DISCHARGE DIAGNOSES:  Active Problems:   Essential hypertension, benign   ESRD on dialysis ([place])   Elevated troponin   Hypoxia   Fluid overload   Atrial flutter with controlled response (HCC)   Systolic HF (heart failure) (HCC)   RECOMMENDATIONS FOR OUTPATIENT FOLLOW UP: 1. PT/INR and CBC to be checked at dialysis. Results of PT/INR to be called to cardiology and CBC to be called to his hematologist to [facility]. 2. Cardiology to arrange outpatient follow-up  DISCHARGE CONDITION: fair  Diet recommendation: As before  Filed Weights   10/02/15 0443 10/02/15 1320 10/05/15 1120  Weight: 105.6 kg (232 lb 12.9 oz) 106.1 kg (233 lb 14.5 oz) 104.5 kg (230 lb 6.1 oz)    INITIAL HISTORY: 70 y/o male with a complex medical history including h/o multiple myeloma s/p bone marrow transplant x 2, chemotherapy treatment with Revlimid/lenalidomide (with potential for cardiac toxicity), ESRD on HD (T, Th, Sat) with anemia of chronic disease/ severe anemia and thrombocytopenia-now transfusion dependent, admitted for suspected accidental overdose with antihypertensive meds, found to be tachypneic, tachycardic and hypoxic on arrival and in new onset atrial flutter, also with 2D echo in 06/2015 showing severely reduced LV systolic function with EF down to 25%. Cardiology consultation placed for recommendations regarding new onset atrial flutter and systolic HF.   Consultations:  Cardiology. Nephrology. Hematology  Procedures: TEE FINDINGS:  No LA thrombus, fair appendage emptying velocities. Biatrial dilation. Severe global LV hypokinesis, EF 20%. Trivial MR. Mild-mod TR. No pericardial effusion. Normal aorta.  EP PROCEDURES:  1. Comprehensive electrophysiology study.  2. Coronary sinus  pacing and recording.  3. Mapping of atrial flutter.  5. Ablation of atrial flutter.  6. Arrhythmia induction with pacing.    HOSPITAL COURSE:   Hypoxic respiratory failure secondary to acute on chronic systolic heart failure, new onset atrial flutter, improved Chest x-ray 5/16 showed small bilateral pleural effusions. Patient was found to have chronic systolic heart failure with EF of 25% on 2-D echo done on 07/15/15. This is compounded by anemia secondary to multiple myeloma. Repeat 2-D echo in 2-3 months to assess LV function.   New onset atrial flutter now in atrial fibrillation Seen by cardiology and EP. Patient underwent ablation. He converted to sinus rhythm. During dialysis today patient went into atrial fibrillation at a controlled rate. Discussed with cardiology. Patient will be ambulated and if heart rate remains controlled. Could still go home. He is on a beta blocker, which will be continued. Cardiology will arrange outpatient follow-up. Patient was started on Coumadin after ablation. He will need to be on Coumadin for 2 weeks post ablation as per cardiology. But this duration may have to be revisited now that the patient is in atrial fibrillation. Will defer this decision to cardiology as outpatient. Warfarin has been initiated. PT/INR to be checked with dialysis on Tuesday. This has been communicated to the nephrologist. If he starts bleeding, would need to stop Coumadin.   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 Continue ACE inhibitor, also introduce beta blocker. Not a candidate for cardiac catheterization as he would not be able to tolerate DAPT if PCI is needed. Will need repeat echocardiogram as outpatient. Will be determined by cardiology.  Multiple myeloma/anemia of chronic disease Patient has anemia and thrombocytopenia, which is transfusion  dependent. Patient underwent CT-guidedBone marrow biopsyon 09/18/15. 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 Status post 2 units of packed red blood cells and 2 unit of platelets. Platelet count is stable. He will need continued monitoring of his blood counts as outpatient.  Anemia secondary to multiple myeloma Improved after receiving packed red blood cells and platelets. Has received a total of 17 units of platelets and 10 units of packed red blood cells in 2017. He hasn't had any bleeding hospital. Hemoglobin has been stable the last 3 days. Platelet count is also stable. Will need close monitoring as outpatient. This can be done at his next dialysis session.  ESRD Seen by nephrology here. Followed his usual dialysis schedule.  Essential Hypertension Continue home medications. Blood pressure stable.  Patient has been constipated the last few days. He has been asked to continue taking laxatives at home. His abdomen is benign.  Overall, stable. Patient would like to go home. Okay for discharge today. Patient knows that he needs to have regular blood work.   PERTINENT LABS:  The results of significant diagnostics from this hospitalization (including imaging, microbiology, ancillary and laboratory) are listed below for reference.    Microbiology: Recent Results (from the past 240 hour(s))  MRSA PCR Screening     Status: None   Collection Time: 10/03/15  2:00 AM  Result Value Ref Range Status   MRSA by PCR NEGATIVE NEGATIVE Final    Comment:        The GeneXpert MRSA Assay (FDA approved for NASAL specimens only), is one component of a comprehensive MRSA colonization surveillance program. It is not intended to diagnose MRSA infection nor to guide or monitor treatment for MRSA infections.      Labs: Basic Metabolic Panel:  Recent Labs Lab 10/01/15 1705 10/02/15 0447 10/03/15 0700 10/04/15 0525 10/05/15 0440  NA 141 138 139 139 139  K 5.0 4.1 4.2 4.5 4.8  CL 105 99* 98* 100* 100*  CO2 25 29 29 27 27   GLUCOSE 137* 90 88 94 102*  BUN 55* 30*  30* 53* 71*  CREATININE 7.41* 4.50* 3.80* 5.41* 6.66*  CALCIUM 9.8 8.9 8.7* 9.2 10.0  PHOS 5.1*  --   --   --   --    Liver Function Tests:  Recent Labs Lab 10/01/15 1705 10/02/15 0447 10/04/15 0525 10/05/15 0440  AST  --  15 22 15   ALT  --  13* 13* 13*  ALKPHOS  --  45 41 55  BILITOT  --  1.2 1.0 0.8  PROT  --  6.1* 6.1* 6.4*  ALBUMIN 3.4* 3.1* 3.1* 3.2*   CBC:  Recent Labs Lab 10/01/15 0112  10/01/15 1704 10/02/15 0447 10/03/15 0700 10/04/15 0525 10/05/15 0440  WBC 3.5*  --  3.7* 3.5* 3.3* 3.2* 3.1*  NEUTROABS 2.3  --   --   --   --   --   --   HGB 7.5*  < > 7.4* 7.2* 9.0* 8.8* 8.9*  HCT 23.4*  < > 23.3* 22.1* 26.9* 26.1* 26.9*  MCV 118.8*  --  119.5* 116.9* 109.3* 107.4* 107.2*  PLT 24*  --  21* 12* 27* 38* 26*  < > = values in this interval not displayed.  Cardiac Enzymes:  Recent Labs Lab 10/01/15 0350 10/01/15 0835 10/01/15 1554  TROPONINI 0.10* 0.11* 0.11*    IMAGING STUDIES Dg Chest 2 View  10/01/2015  CLINICAL DATA:  70 year old male, dialysis patient, with shortness of breath. EXAM: CHEST  2 VIEW COMPARISON:  Chest radiograph dated 07/14/2015 FINDINGS: Small bilateral pleural effusions appear increased compared to prior study. Bibasilar densities may represent atelectatic changes or pneumonia. No pneumothorax. There is stable cardiomegaly. Right pectoral central venous catheter with tip over central SVC. Right axillary vascular stent. There is osteopenia with degenerative changes of the spine. Lower thoracic vertebroplasty changes. IMPRESSION: Small bilateral pleural effusions as well as bibasilar atelectasis/ infiltrate, with interval progression compared to the prior study. Electronically Signed   By: Anner Crete M.D.   On: 10/01/2015 02:05    DISCHARGE EXAMINATION: Filed Vitals:   10/05/15 1230 10/05/15 1300 10/05/15 1330 10/05/15 1359  BP: 116/75 113/80 116/75 116/82  Pulse: 58 63 57 59  Temp:      TempSrc:      Resp: 18 18 14 16     Height:      Weight:      SpO2:       General appearance: alert, cooperative, appears stated age and no distress Resp: clear to auscultation bilaterally Cardio: regular rate and rhythm, S1, S2 normal, no murmur, click, rub or gallop GI: soft, non-tender; bowel sounds normal; no masses,  no organomegaly Extremities: extremities normal, atraumatic, no cyanosis or edema  DISPOSITION: Home    ALLERGIES:  Allergies  Allergen Reactions  . Pamidronate Anaphylaxis    blood pressure      Current Discharge Medication List    START taking these medications   Details  carvedilol (COREG) 3.125 MG tablet Take 1 tablet (3.125 mg total) by mouth 2 (two) times daily with a meal. Qty: 60 tablet, Refills: 0    polyethylene glycol (MIRALAX / GLYCOLAX) packet Take 17 g by mouth 2 (two) times daily. Qty: 60 each, Refills: 0    warfarin (COUMADIN) 2.5 MG tablet Take 3 tablets today (5/20), and then 2 tablets from tomorrow (5/21). Qty: 90 tablet, Refills: 0      CONTINUE these medications which have NOT CHANGED   Details  calcium acetate (PHOSLO) 667 MG capsule Take 667-2,001 mg by mouth 3 (three) times daily with meals. 3 capsules (2054m) with meals and 1 capsule (6643m with snacks    docusate sodium (COLACE) 100 MG capsule Take 100 mg by mouth 2 (two) times daily.    furosemide (LASIX) 20 MG tablet Take 40 mg by mouth daily.     HYDROcodone-acetaminophen (NORCO/VICODIN) 5-325 MG tablet Take 1 tablet by mouth every 6 (six) hours as needed for severe pain. Qty: 60 tablet, Refills: 0   Associated Diagnoses: Multiple myeloma not having achieved remission (HCC)    lisinopril (PRINIVIL,ZESTRIL) 20 MG tablet Take 20 mg by mouth every morning.     Omega-3 Fatty Acids (FISH OIL) 1000 MG CAPS Take 2 capsules by mouth daily.    ondansetron (ZOFRAN) 8 MG tablet Take 1 tablet (8 mg total) by mouth every 8 (eight) hours as needed for nausea or vomiting. Qty: 30 tablet, Refills: 1   Associated  Diagnoses: Multiple myeloma, remission status unspecified (HCC)    pregabalin (LYRICA) 75 MG capsule Take 75 mg by mouth daily.      STOP taking these medications     amLODipine (NORVASC) 5 MG tablet      torsemide (DEMADEX) 20 MG tablet          TOTAL DISCHARGE TIME: 35 minutes  [facility] Pager [phone]/20/2017, 2:02 PM

## 2015-10-05 NOTE — Progress Notes (Signed)
Pt belongings picked up from security, pt signed off on the valuables form stating that he has received everything that was locked up in entirety.

## 2015-10-05 NOTE — Progress Notes (Signed)
PT Cancellation Note  Patient Details Name: ROWIN GUNNELL MRN: [id number] DOB: Apr 25, 1946   Cancelled Treatment:    Reason Eval/Treat Not Completed: Patient at procedure or test/unavailable.  Patient in HD.  Will return at later time for PT evaluation.   Despina Pole 10/05/2015, 1:23 PM Carita Pian Sanjuana Kava, [place] Pager [phone]

## 2015-10-06 LAB — TYPE AND SCREEN
ABO/RH(D): A POS
ANTIBODY SCREEN: NEGATIVE
Unit division: 0
Unit division: 0
Unit division: 0

## 2015-10-07 ENCOUNTER — Encounter (HOSPITAL_COMMUNITY): Payer: Medicare Other

## 2015-10-07 ENCOUNTER — Telehealth (HOSPITAL_COMMUNITY): Payer: Self-pay | Admitting: Oncology

## 2015-10-07 ENCOUNTER — Other Ambulatory Visit (HOSPITAL_COMMUNITY): Payer: Self-pay | Admitting: Oncology

## 2015-10-07 ENCOUNTER — Telehealth: Payer: Self-pay | Admitting: *Deleted

## 2015-10-07 ENCOUNTER — Telehealth (HOSPITAL_COMMUNITY): Payer: Self-pay | Admitting: *Deleted

## 2015-10-07 DIAGNOSIS — C9000 Multiple myeloma not having achieved remission: Secondary | ICD-10-CM

## 2015-10-07 LAB — CBC WITH DIFFERENTIAL/PLATELET
BASOS ABS: 0 10*3/uL (ref 0.0–0.1)
Basophils Relative: 0 %
EOS PCT: 2 %
Eosinophils Absolute: 0.1 10*3/uL (ref 0.0–0.7)
HCT: 27.3 % — ABNORMAL LOW (ref 39.0–52.0)
Hemoglobin: 9.2 g/dL — ABNORMAL LOW (ref 13.0–17.0)
LYMPHS ABS: 1.1 10*3/uL (ref 0.7–4.0)
Lymphocytes Relative: 34 %
MCH: 36.5 pg — ABNORMAL HIGH (ref 26.0–34.0)
MCHC: 33.7 g/dL (ref 30.0–36.0)
MCV: 108.3 fL — ABNORMAL HIGH (ref 78.0–100.0)
MONO ABS: 0.4 10*3/uL (ref 0.1–1.0)
Monocytes Relative: 12 %
NEUTROS PCT: 52 %
Neutro Abs: 1.7 10*3/uL (ref 1.7–7.7)
PLATELETS: 12 10*3/uL — AB (ref 150–400)
RBC: 2.52 MIL/uL — AB (ref 4.22–5.81)
RDW: 22.9 % — AB (ref 11.5–15.5)
WBC: 3.3 10*3/uL — AB (ref 4.0–10.5)

## 2015-10-07 LAB — SAMPLE TO BLOOD BANK

## 2015-10-07 NOTE — Progress Notes (Unsigned)
CRITICAL VALUE ALERT Critical value received:  Platelets 12,000 Date of notification:  10/07/15 Time of notification: [id number] Critical value read back:  Yes.   Nurse who received alert:  Isidoro Donning RN MD notified (1st page):  Dr. Whitney Muse 1230 1 unit of irradiated platelets ordered.

## 2015-10-07 NOTE — Telephone Encounter (Signed)
Dr Oval Linsey spoke with Caroleen Hamman PA  Patient has low platelet and he wanted to make sure patient ok to take Warfarin with this. He does have platelet transfusions weekly Dr Buddy Duty advised to continue current regimen and follow up with EP 1 week and INR this week  Did send message to EP and anicoag clinic for scheduling

## 2015-10-07 NOTE — Telephone Encounter (Signed)
Patient is here for his weekly CBC check as he is transfusion dependent.  Platelet count is 12,000.  The patient is set-up for a platelet transfusion in near future while accommodating his hemodialysis schedule.  Patient called asking about his Coumadin need.  I reviewed the patient's chart.  He was started on Vitamin K antagonist therapy following ablation for A-flutter.  I have reviewed the patient's hospital chart.  He was seen by Dr. Johnsie Cancel and Dr. Sallyanne Kuster while in the hospital.  Their notes are reviewed in detail.  Vitamin K antagonist therapy is recommended for 2 weeks post-ablation.  Given his severe thrombocytopenia and weekly need for platelet transfusion, I attempted to get in contact with either Dr. Johnsie Cancel or Dr. Sallyanne Kuster.  Both are out of the office and therefore I had the opportunity to discuss the patient's case with Dr. Oval Linsey.  She agreed with a platelet transfusion as planned and she reports that she will get the patient in for follow-up (as he is not scheduled for any according to [facility]).  I am not sure who is following his INR, but I suspect nephrology may be with his hemodialysis.  Mr. Costilow typically requires weekly platelet transfusion and every 2-3 week PRBC transfusion for his pancytopenia suspicious for evolving MDS.  He has been referred and seen by Dr. 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    Audric is scheduled for follow-up at [facility] on 6/2 and at [facility] on 6/12.  Based upon Dr. Blenda Mounts recommendations, for the time being, Crecencio is advised to continue with his Vitamin K antagonist therapy.  We will continue to follow his blood counts and transfuse accordingly.  KEFALAS,THOMAS, PA-C 10/07/2015 3:10 PM

## 2015-10-07 NOTE — Telephone Encounter (Signed)
Lyrica called into [facility]. I spoke with Nicki Reaper regarding drug/directions.

## 2015-10-08 ENCOUNTER — Encounter (HOSPITAL_BASED_OUTPATIENT_CLINIC_OR_DEPARTMENT_OTHER): Payer: Medicare Other

## 2015-10-08 VITALS — BP 109/67 | HR 53 | Temp 97.1°F | Resp 18

## 2015-10-08 DIAGNOSIS — C9000 Multiple myeloma not having achieved remission: Secondary | ICD-10-CM

## 2015-10-08 MED ORDER — SODIUM CHLORIDE 0.9% FLUSH
10.0000 mL | INTRAVENOUS | Status: AC | PRN
  Administered 2015-10-08: 10 mL

## 2015-10-08 MED ORDER — SODIUM CHLORIDE 0.9 % IV SOLN
250.0000 mL | Freq: Once | INTRAVENOUS | Status: AC
  Administered 2015-10-08: 250 mL via INTRAVENOUS

## 2015-10-08 MED ORDER — ACETAMINOPHEN 325 MG PO TABS
650.0000 mg | ORAL_TABLET | Freq: Once | ORAL | Status: AC
  Administered 2015-10-08: 650 mg via ORAL

## 2015-10-08 MED ORDER — DIPHENHYDRAMINE HCL 25 MG PO CAPS
ORAL_CAPSULE | ORAL | Status: AC
  Filled 2015-10-08: qty 1

## 2015-10-08 MED ORDER — ACETAMINOPHEN 325 MG PO TABS
ORAL_TABLET | ORAL | Status: AC
  Filled 2015-10-08: qty 2

## 2015-10-08 MED ORDER — DIPHENHYDRAMINE HCL 25 MG PO CAPS
25.0000 mg | ORAL_CAPSULE | Freq: Once | ORAL | Status: AC
  Administered 2015-10-08: 25 mg via ORAL

## 2015-10-08 MED ORDER — HEPARIN SOD (PORK) LOCK FLUSH 100 UNIT/ML IV SOLN
500.0000 [IU] | Freq: Every day | INTRAVENOUS | Status: AC | PRN
  Administered 2015-10-08: 500 [IU]

## 2015-10-08 NOTE — Patient Instructions (Signed)
[place] at [facility] Discharge Instructions  RECOMMENDATIONS MADE BY THE CONSULTANT AND ANY TEST RESULTS WILL BE SENT TO YOUR REFERRING PHYSICIAN.  Platelets today.    Thank you for choosing [place] at [facility] to provide your oncology and hematology care.  To afford each patient quality time with our provider, please arrive at least 15 minutes before your scheduled appointment time.   Beginning January 23rd 2017 lab work for the [facility] will be done in the  Main lab at [facility] on 1st floor. If you have a lab appointment with the [facility] please come in thru the  Main Entrance and check in at the main information desk  You need to re-schedule your appointment should you arrive 10 or more minutes late.  We strive to give you quality time with our providers, and arriving late affects you and other patients whose appointments are after yours.  Also, if you no show three or more times for appointments you may be dismissed from the clinic at the providers discretion.     Again, thank you for choosing [facility].  Our hope is that these requests will decrease the amount of time that you wait before being seen by our physicians.       _____________________________________________________________  Should you have questions after your visit to [facility], please contact our office at [phone] between the hours of 8:30 a.m. and 4:30 p.m.  Voicemails left after 4:30 p.m. will not be returned until the following business day.  For prescription refill requests, have your pharmacy contact our office.         Resources For Cancer Patients and their Caregivers ? American Cancer Society: Can assist with transportation, wigs, general needs, runs Look Good Feel Better.        402-448-6355 ? Cancer Care: Provides financial assistance, online support groups, medication/co-pay assistance.   1-800-813-HOPE 848-373-6074) ? [facility] Assists [place] cancer patients and their families through emotional , educational and financial support.  3141629851 ? [place] DSS Where to apply for food stamps, Medicaid and utility assistance. 601 588 8613 ? [facility]: Transportation to medical appointments. 289-199-3894 ? Social Security Administration: May apply for disability if have a Stage IV cancer. (513)063-2777 (747) 824-8186 ? [employer], Disability and Transit Services: Assists with nutrition, care and transit needs. [place] Support Programs: @10RELATIVEDAYS @ > Cancer Support Group  2nd Tuesday of the month 1pm-2pm, Journey Room  > Creative Journey  3rd Tuesday of the month 1130am-1pm, Journey Room  > Look Good Feel Better  1st Wednesday of the month 10am-12 noon, Journey Room (Call [place] to register [phone])

## 2015-10-08 NOTE — Progress Notes (Signed)
Patient tolerated transfusion well.  VSS throughout.

## 2015-10-09 ENCOUNTER — Other Ambulatory Visit (HOSPITAL_COMMUNITY): Payer: Medicare Other

## 2015-10-09 ENCOUNTER — Encounter (HOSPITAL_COMMUNITY): Payer: Self-pay | Admitting: *Deleted

## 2015-10-09 ENCOUNTER — Encounter (HOSPITAL_COMMUNITY): Payer: Medicare Other

## 2015-10-09 ENCOUNTER — Emergency Department (HOSPITAL_COMMUNITY)
Admission: EM | Admit: 2015-10-09 | Discharge: 2015-10-09 | Disposition: A | Discharge status: Home / Self Care | Payer: Medicare Other | Attending: Emergency Medicine | Admitting: Emergency Medicine

## 2015-10-09 DIAGNOSIS — Y658 Other specified misadventures during surgical and medical care: Secondary | ICD-10-CM | POA: Diagnosis not present

## 2015-10-09 DIAGNOSIS — T8089XA Other complications following infusion, transfusion and therapeutic injection, initial encounter: Secondary | ICD-10-CM | POA: Diagnosis present

## 2015-10-09 DIAGNOSIS — Z79899 Other long term (current) drug therapy: Secondary | ICD-10-CM | POA: Insufficient documentation

## 2015-10-09 DIAGNOSIS — I10 Essential (primary) hypertension: Secondary | ICD-10-CM | POA: Insufficient documentation

## 2015-10-09 DIAGNOSIS — D649 Anemia, unspecified: Secondary | ICD-10-CM | POA: Insufficient documentation

## 2015-10-09 DIAGNOSIS — T82838A Hemorrhage of vascular prosthetic devices, implants and grafts, initial encounter: Secondary | ICD-10-CM

## 2015-10-09 DIAGNOSIS — D696 Thrombocytopenia, unspecified: Secondary | ICD-10-CM | POA: Diagnosis not present

## 2015-10-09 DIAGNOSIS — Z79891 Long term (current) use of opiate analgesic: Secondary | ICD-10-CM | POA: Insufficient documentation

## 2015-10-09 LAB — PREPARE PLATELET PHERESIS: UNIT DIVISION: 0

## 2015-10-09 LAB — CBC WITH DIFFERENTIAL/PLATELET
BASOS PCT: 0 %
Basophils Absolute: 0 10*3/uL (ref 0.0–0.1)
Eosinophils Absolute: 0.1 10*3/uL (ref 0.0–0.7)
Eosinophils Relative: 3 %
HCT: 25.9 % — ABNORMAL LOW (ref 39.0–52.0)
HEMOGLOBIN: 8.9 g/dL — AB (ref 13.0–17.0)
Lymphocytes Relative: 27 %
Lymphs Abs: 0.7 10*3/uL (ref 0.7–4.0)
MCH: 37.7 pg — ABNORMAL HIGH (ref 26.0–34.0)
MCHC: 34.4 g/dL (ref 30.0–36.0)
MCV: 109.7 fL — ABNORMAL HIGH (ref 78.0–100.0)
MONOS PCT: 11 %
Monocytes Absolute: 0.3 10*3/uL (ref 0.1–1.0)
NEUTROS PCT: 59 %
Neutro Abs: 1.5 10*3/uL — ABNORMAL LOW (ref 1.7–7.7)
PLATELETS: 28 10*3/uL — AB (ref 150–400)
RBC: 2.61 MIL/uL — ABNORMAL LOW (ref 4.22–5.81)
WBC: 2.6 10*3/uL — AB (ref 4.0–10.5)

## 2015-10-09 LAB — PROTIME-INR
INR: 4.36 — AB (ref 0.00–1.49)
PROTHROMBIN TIME: 40.6 s — AB (ref 11.6–15.2)

## 2015-10-09 NOTE — ED Notes (Signed)
Pt sates he went to dialysis yesterday and was sent home with bandages to cover his fistula. He states he took the bandages off and his fistula began bleeding. Pt re-wrapped this area and woke up with blood on his bandage. Pt states he received platelets thorough his port at the cancer center yesterday. When he woke up this morning his port was bleeding. Pt has both area covered now with bleeding controlled. NAD noted. VS stable.

## 2015-10-09 NOTE — ED Notes (Signed)
Lab at bedside

## 2015-10-09 NOTE — ED Notes (Signed)
Dr Christy Gentles notified of platelet count of 28, no further orders.

## 2015-10-09 NOTE — ED Notes (Signed)
MD at bedside. 

## 2015-10-09 NOTE — Discharge Instructions (Signed)
PLEASE HOLD YOUR COUMADIN DOSE TODAY AND TOMORROW AND FOLLOWUP ON Friday FOR YOUR COUMADIN LEVEL CHECK

## 2015-10-09 NOTE — ED Provider Notes (Signed)
CSN: [id number]     Arrival date & time 10/09/15  4315 History  By signing my name below, I, [name], attest that this documentation has been prepared under the direction and in the presence of Gregory Fraise, MD.   Electronically Signed: Nicole [name], ED Scribe. 10/09/2015. 10:11 AM   Chief Complaint  Patient presents with  . Bleeding Port     The history is provided by the patient. No language interpreter was used.   HPI Comments: [name] is a 70 y.o. male with extensive PMHx as noted below who presents to the Emergency Department complaining of gradual onset, right chest port bleed and right upper extremity dialysis access bleed, onset yesterday. Pt received dialysis yesterday and was told his hemoglobin and platelet counts were low. He came by the ED to receive platelet treatment yesterday before the bleeding began. No other associated symptoms noted. No other worsening or alleviating factors noted. Pt denies fevers, chills, emesis, nausea, hematochezia, chest pain, abdominal pain, or any other pertinent symptoms. Pt is on coumadin.  Past Medical History  Diagnosis Date  . Hypertension   . Ruptured cervical disc   . Fall resulting in striking against other object     paralyzed  . Multiple myeloma   . Recurrent multiple myeloma of bone marrow with unknown EBV status ([place])   . Multiple myeloma ([place]) 01/26/2011  . Unspecified vitamin D deficiency 03/06/2013  . Anemia of chronic disease 12/30/2013  . Constipation   . Dialysis patient ([facility]) 2016  . Thrombocytopenia ([place]) 08/31/2014  . 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 Constipation   . Hypoxia 09/2015   Past Surgical History  Procedure Laterality Date  . Inguinal hernia repair Bilateral   . Anterior cervical decomp/discectomy fusion    . Bone marrow aspirate and biopsy wiith lumbar puncture    .  Melanoma excision      rt. breast  . Cervical spine surgery    . Kyphoplasty Bilateral 01/30/2014    Procedure: Thoracic eleven Kyphoplasty;  Surgeon: Consuella Lose, MD;  Location: [facility];  Service: Neurosurgery;  Laterality: Bilateral;  Thoracic eleven Kyphoplasty  . Av fistula placement Right 04/30/2014    Procedure: BRACHIOCEPHALIC ARTERIOVENOUS (AV) FISTULA CREATION;  Surgeon: Conrad Eureka, MD;  Location: [facility];  Service: Vascular;  Laterality: Right;  . Portacath placement Left 02/18/2015    Procedure: INSERTION OF PORT A CATH RIGHT INTERNAL JUGULAR VEIN ;  Surgeon: Conrad Van Vleck, MD;  Location: [facility];  Service: Vascular;  Laterality: Left;  . Cataract extraction w/phaco Right 04/15/2015    Procedure: CATARACT EXTRACTION PHACO AND INTRAOCULAR LENS PLACEMENT (IOC);  Surgeon: Tonny Branch, MD;  Location: [facility];  Service: Ophthalmology;  Laterality: Right;  CDE:8.97  . Cataract extraction w/phaco Left 04/29/2015    Procedure: CATARACT EXTRACTION PHACO AND INTRAOCULAR LENS PLACEMENT ; CDE:  7.61;  Surgeon: Tonny Branch, MD;  Location: [facility];  Service: Ophthalmology;  Laterality: Left;  . Electrophysiologic study N/A 10/03/2015    Procedure: A-Flutter Ablation;  Surgeon: Evans Lance, MD;  Location: [facility];  Service: Cardiovascular;  Laterality: N/A;   Family History  Problem Relation Age of Onset  . Cancer Sister   . Cancer Brother    Social History  Substance Use Topics  . Smoking status: Never Smoker   . Smokeless tobacco: Never Used  .  Alcohol Use: No    Review of Systems  Constitutional: Negative for fever and chills.  Cardiovascular: Negative for chest pain.  Gastrointestinal: Negative for nausea, vomiting, abdominal pain and blood in stool.  Skin:       Bleeding RUQ port and bleeding chest port.   All other systems reviewed and are negative.   Allergies  Pamidronate  Home Medications   Prior to Admission medications   Medication Sig Start Date End  Date Taking? Authorizing Provider  calcium acetate (PHOSLO) 667 MG capsule Take 667-2,001 mg by mouth 3 (three) times daily with meals. 3 capsules (2095m) with meals and 1 capsule (6670m with snacks   Yes Historical Provider, MD  carvedilol (COREG) 3.125 MG tablet Take 1 tablet (3.125 mg total) by mouth 2 (two) times daily with a meal. 10/05/15  Yes GoBonnielee HaffMD  docusate sodium (COLACE) 100 MG capsule Take 100 mg by mouth 2 (two) times daily.   Yes Historical Provider, MD  furosemide (LASIX) 20 MG tablet Take 40 mg by mouth daily.    Yes Historical Provider, MD  HYDROcodone-acetaminophen (NORCO/VICODIN) 5-325 MG tablet Take 1 tablet by mouth every 6 (six) hours as needed for severe pain. 09/23/15  Yes ThManon Hildingefalas, PA-C  lisinopril (PRINIVIL,ZESTRIL) 20 MG tablet Take 20 mg by mouth every morning.  06/27/15  Yes Historical Provider, MD  LYRICA 75 MG capsule TAKE 1 CAPSULE BY MOUTH TWICE DAILY. 10/07/15  Yes ThBaird CancerPA-C  Omega-3 Fatty Acids (FISH OIL) 1000 MG CAPS Take 2 capsules by mouth daily.   Yes Historical Provider, MD  ondansetron (ZOFRAN) 8 MG tablet Take 1 tablet (8 mg total) by mouth every 8 (eight) hours as needed for nausea or vomiting. 03/15/15  Yes ThManon Hildingefalas, PA-C  polyethylene glycol (MIRALAX / GLYCOLAX) packet Take 17 g by mouth 2 (two) times daily. 10/05/15  Yes GoBonnielee HaffMD  warfarin (COUMADIN) 2.5 MG tablet Take 3 tablets today (5/20), and then 2 tablets from tomorrow (5/21). 10/05/15  Yes GoBonnielee HaffMD   BP 144/82 mmHg  Pulse 58  Temp(Src) 97.8 F (36.6 C) (Oral)  Resp 20  Ht 5' 11"  (1.803 m)  Wt 229 lb (103.874 kg)  BMI 31.95 kg/m2  SpO2 100% Physical Exam CONSTITUTIONAL: Well developed/well nourished HEAD: Normocephalic/atraumatic EYES: EOMI ENMT: Mucous membranes moist NECK: supple no meningeal signs CV:  no murmurs/rubs/gallops noted LUNGS: Lungs are clear to auscultation bilaterally, no apparent distress ABDOMEN: soft,  nontender NEURO: Pt is awake/alert/appropriate, moves all extremitiesx4.  No facial droop.   EXTREMITIES: pulses normal/equal, full ROM SKIN: warm, color normal, bandage noted to port in right chest, bandage noted to port in right upper extremity, no active bleeding PSYCH: no abnormalities of mood noted, alert and oriented to situation  ED Course  Procedures DIAGNOSTIC STUDIES: Oxygen Saturation is 100% on RA, normal by my interpretation.    COORDINATION OF CARE: 10:11 AM Discussed treatment plan which includes CBC with differential with pt at bedside and pt agreed to plan.  Labs Review Labs Reviewed  CBC WITH DIFFERENTIAL/PLATELET - Abnormal; Notable for the following:    WBC 2.6 (*)    RBC 2.61 (*)    Hemoglobin 8.9 (*)    HCT 25.9 (*)    MCV 109.7 (*)    MCH 37.7 (*)    Platelets 28 (*)    Neutro Abs 1.5 (*)    All other components within normal limits  PROTIME-INR - Abnormal; Notable for the following:  Prothrombin Time 40.6 (*)    INR 4.36 (*)    All other components within normal limits     I have personally reviewed and evaluated these  lab results as part of my medical decision-making.    Pt well appearing Monitored for several hours and no active bleeding here Platelets improved No change in HGB He is coagulopathic from coumadin (though no active bleeding) He just started this after recent hospital stay, atrial fib/flutter He reports he has cardiology/lab check in 48 hours After discussion with pharmacy will have him hold doses today and tomorrow of coumadin and INR check in 48 hours Advised to return for any recurrent bleeding Pt otherwise stable/at baseline  MDM   Final diagnoses:  Thrombocytopenia (HCC)  Anemia, unspecified anemia type  Bleeding from dialysis shunt, initial encounter [facility])    Nursing notes including past medical history and social history reviewed and considered in documentation Labs/vital reviewed myself and considered during  evaluation   I personally performed the services described in this documentation, which was scribed in my presence. The recorded information has been reviewed and is accurate.       Gregory Fraise, MD 10/09/15 1359

## 2015-10-10 ENCOUNTER — Encounter (HOSPITAL_COMMUNITY): Payer: Self-pay

## 2015-10-11 ENCOUNTER — Ambulatory Visit (INDEPENDENT_AMBULATORY_CARE_PROVIDER_SITE_OTHER): Payer: Medicare Other | Admitting: Adult Health

## 2015-10-11 ENCOUNTER — Encounter (HOSPITAL_COMMUNITY): Payer: Medicare Other

## 2015-10-11 ENCOUNTER — Telehealth (HOSPITAL_COMMUNITY): Payer: Self-pay | Admitting: *Deleted

## 2015-10-11 ENCOUNTER — Encounter: Payer: Self-pay | Admitting: Adult Health

## 2015-10-11 VITALS — BP 110/72 | HR 53 | Ht 71.0 in | Wt 234.0 lb

## 2015-10-11 DIAGNOSIS — C9000 Multiple myeloma not having achieved remission: Secondary | ICD-10-CM

## 2015-10-11 DIAGNOSIS — I5189 Other ill-defined heart diseases: Secondary | ICD-10-CM | POA: Insufficient documentation

## 2015-10-11 DIAGNOSIS — N185 Chronic kidney disease, stage 5: Secondary | ICD-10-CM | POA: Diagnosis not present

## 2015-10-11 DIAGNOSIS — I480 Paroxysmal atrial fibrillation: Secondary | ICD-10-CM | POA: Diagnosis not present

## 2015-10-11 DIAGNOSIS — I519 Heart disease, unspecified: Secondary | ICD-10-CM

## 2015-10-11 LAB — CBC WITH DIFFERENTIAL/PLATELET
BASOS ABS: 0 10*3/uL (ref 0.0–0.1)
Basophils Relative: 0 %
EOS PCT: 1 %
Eosinophils Absolute: 0 10*3/uL (ref 0.0–0.7)
HEMATOCRIT: 26.7 % — AB (ref 39.0–52.0)
HEMOGLOBIN: 9 g/dL — AB (ref 13.0–17.0)
LYMPHS PCT: 38 %
Lymphs Abs: 1.2 10*3/uL (ref 0.7–4.0)
MCH: 38 pg — ABNORMAL HIGH (ref 26.0–34.0)
MCHC: 33.7 g/dL (ref 30.0–36.0)
MCV: 112.7 fL — ABNORMAL HIGH (ref 78.0–100.0)
MONOS PCT: 14 %
Monocytes Absolute: 0.4 10*3/uL (ref 0.1–1.0)
Neutro Abs: 1.6 10*3/uL — ABNORMAL LOW (ref 1.7–7.7)
Neutrophils Relative %: 47 %
Platelets: 17 10*3/uL — CL (ref 150–400)
RBC: 2.37 MIL/uL — AB (ref 4.22–5.81)
WBC: 3.2 10*3/uL — AB (ref 4.0–10.5)

## 2015-10-11 NOTE — Progress Notes (Unsigned)
CRITICAL VALUE ALERT  Critical value received:  Platelets 17,000  Date of notification:  10/11/15  Time of notification:  [id number]  Critical value read back:Yes.    Nurse who received alert:  A. Ouida Sills, RN  MD notified: Caroleen Hamman, PA-C at [phone]

## 2015-10-11 NOTE — Telephone Encounter (Signed)
Yes, he can have blood drawn.  CBC diff.  Lauria Depoy, PA-C 1:29 PM 10/11/2015

## 2015-10-11 NOTE — Progress Notes (Signed)
Name: [name]    DOB: May 05, 1946  Age: 70 y.o.  MR#: [id number]       PCP:  No PCP Per Patient      Insurance: Payor: [employer] MEDICARE / Plan: [facility] MEDICARE / Product Type: *No Product type* /   CC:    Chief Complaint  Patient presents with  . Atrial Fibrillation  . Congestive Heart Failure    VS Filed Vitals:   10/11/15 1354  BP: 110/72  Pulse: 53  Height: _0  (1.803 m)  Weight: 234 lb (106.142 kg)  SpO2: 97%    Weights Current Weight  10/11/15 234 lb (106.142 kg)  10/09/15 229 lb (103.874 kg)  10/05/15 230 lb 6.1 oz (104.5 kg)    Blood Pressure  BP Readings from Last 3 Encounters:  10/11/15 110/72  10/09/15 131/88  10/08/15 109/67     Admit date:  (Not on file) Last encounter with RMR:  Visit date not found   Allergy Pamidronate  Current Outpatient Prescriptions  Medication Sig Dispense Refill  . amLODipine (NORVASC) 5 MG tablet Take 5 mg by mouth daily.    . carvedilol (COREG) 3.125 MG tablet Take 1 tablet (3.125 mg total) by mouth 2 (two) times daily with a meal. 60 tablet 0  . cinacalcet (SENSIPAR) 30 MG tablet Take 30 mg by mouth at bedtime.    . docusate sodium (COLACE) 100 MG capsule Take 100 mg by mouth 2 (two) times daily.    Marland Kitchen HYDROcodone-acetaminophen (NORCO/VICODIN) 5-325 MG tablet Take 1 tablet by mouth every 6 (six) hours as needed for severe pain. 60 tablet 0  . lisinopril (PRINIVIL,ZESTRIL) 20 MG tablet Take 20 mg by mouth every morning.     Marland Kitchen LYRICA 75 MG capsule TAKE 1 CAPSULE BY MOUTH TWICE DAILY. 60 capsule 2  . ondansetron (ZOFRAN) 8 MG tablet Take 1 tablet (8 mg total) by mouth every 8 (eight) hours as needed for nausea or vomiting. 30 tablet 1  . polyethylene glycol (MIRALAX / GLYCOLAX) packet Take 17 g by mouth 2 (two) times daily. 60 each 0  . torsemide (DEMADEX) 20 MG tablet Take 40 mg by mouth daily.     Marland Kitchen warfarin (COUMADIN) 2.5 MG tablet Take 3 tablets today (5/20), and then 2 tablets from tomorrow (5/21). 90 tablet 0    No current facility-administered medications for this visit.    Discontinued Meds:    Medications Discontinued During This Encounter  Medication Reason  . Omega-3 Fatty Acids (FISH OIL) 1000 MG CAPS Error  . furosemide (LASIX) 20 MG tablet Error  . calcium acetate (PHOSLO) 667 MG capsule Error    Patient Active Problem List   Diagnosis Date Noted  . Hypoxia 10/01/2015  . Fluid overload 10/01/2015  . Atrial flutter with controlled response ([place]) 10/01/2015  . Systolic HF (heart failure) ([place]) 10/01/2015  . Nausea vomiting and diarrhea 07/15/2015  . Pancytopenia ([place]) 07/15/2015  . Elevated troponin 07/15/2015  . Chronic CHF (congestive heart failure) ([place]) 07/15/2015  . Abnormal EKG   . ESRD on hemodialysis ([place])   . Pleural effusion on right   . Anemia 02/23/2015  . Weakness generalized 02/23/2015  . ESRD on dialysis ([place]) 02/23/2015  . Anemia, chronic renal failure 02/23/2015  . Bleeding 12/02/2014  . Thrombocytopenia ([place]) 08/31/2014  . Anemia in chronic kidney disease 06/06/2014  . Pathologic fracture 01/30/2014  . Anemia of chronic disease 12/30/2013  . Chronic renal disease, stage 5, glomerular filtration rate less than or  equal to 15 mL/min/1.73 square meter ([place]) 12/30/2013  . Lymphedema of lower extremity 08/08/2013  . Unspecified vitamin D deficiency 03/06/2013  . Intravenous pamidronate causing adverse effect in therapeutic use 10/14/2012  . Multiple myeloma ([place]) 01/26/2011  . Essential hypertension, benign 03/05/2010  . BRADYCARDIA 03/05/2010    LABS    Component Value Date/Time   NA 139 10/05/2015 0440   NA 139 10/04/2015 0525   NA 139 10/03/2015 0700   K 4.8 10/05/2015 0440   K 4.5 10/04/2015 0525   K 4.2 10/03/2015 0700   CL 100* 10/05/2015 0440   CL 100* 10/04/2015 0525   CL 98* 10/03/2015 0700   CO2 27 10/05/2015 0440   CO2 27 10/04/2015 0525   CO2 29 10/03/2015 0700   GLUCOSE 102* 10/05/2015 0440   GLUCOSE 94 10/04/2015 0525   GLUCOSE  88 10/03/2015 0700   BUN 71* 10/05/2015 0440   BUN 53* 10/04/2015 0525   BUN 30* 10/03/2015 0700   CREATININE 6.66* 10/05/2015 0440   CREATININE 5.41* 10/04/2015 0525   CREATININE 3.80* 10/03/2015 0700   CREATININE 43.09* 01/26/2012 1052   CREATININE 2.42* 05/25/2011 1056   CREATININE 2.82* 01/15/2011 1034   CALCIUM 10.0 10/05/2015 0440   CALCIUM 9.2 10/04/2015 0525   CALCIUM 8.7* 10/03/2015 0700   GFRNONAA 8* 10/05/2015 0440   GFRNONAA 10* 10/04/2015 0525   GFRNONAA 15* 10/03/2015 0700   GFRAA 9* 10/05/2015 0440   GFRAA 11* 10/04/2015 0525   GFRAA 17* 10/03/2015 0700   CMP     Component Value Date/Time   NA 139 10/05/2015 0440   K 4.8 10/05/2015 0440   CL 100* 10/05/2015 0440   CO2 27 10/05/2015 0440   GLUCOSE 102* 10/05/2015 0440   BUN 71* 10/05/2015 0440   CREATININE 6.66* 10/05/2015 0440   CREATININE 43.09* 01/26/2012 1052   CALCIUM 10.0 10/05/2015 0440   PROT 6.4* 10/05/2015 0440   ALBUMIN 3.2* 10/05/2015 0440   AST 15 10/05/2015 0440   ALT 13* 10/05/2015 0440   ALKPHOS 55 10/05/2015 0440   BILITOT 0.8 10/05/2015 0440   GFRNONAA 8* 10/05/2015 0440   GFRAA 9* 10/05/2015 0440       Component Value Date/Time   WBC 2.6* 10/09/2015 1050   WBC 3.3* 10/07/2015 1146   WBC 3.1* 10/05/2015 0440   HGB 8.9* 10/09/2015 1050   HGB 9.2* 10/07/2015 1146   HGB 8.9* 10/05/2015 0440   HCT 25.9* 10/09/2015 1050   HCT 27.3* 10/07/2015 1146   HCT 26.9* 10/05/2015 0440   MCV 109.7* 10/09/2015 1050   MCV 108.3* 10/07/2015 1146   MCV 107.2* 10/05/2015 0440    Lipid Panel     Component Value Date/Time   CHOL 166 03/03/2013 0858   TRIG 238* 03/03/2013 0858   HDL 28* 03/03/2013 0858   CHOLHDL 5.9 03/03/2013 0858   VLDL 48* 03/03/2013 0858   LDLCALC 90 03/03/2013 0858    ABG    Component Value Date/Time   TCO2 20 10/01/2015 0138     Lab Results  Component Value Date   TSH 2.981 02/23/2015   BNP (last 3 results) No results for input(s): BNP in the last 8760  hours.  ProBNP (last 3 results) No results for input(s): PROBNP in the last 8760 hours.  Cardiac Panel (last 3 results) No results for input(s): CKTOTAL, CKMB, TROPONINI, RELINDX in the last 72 hours.  Iron/TIBC/Ferritin/ %Sat    Component Value Date/Time   IRON 72 11/21/2007 0845   TIBC 280  11/21/2007 0845   FERRITIN 240 06/06/2014 0922   IRONPCTSAT 26 11/21/2007 0845     EKG Orders placed or performed during the hospital encounter of 10/01/15  . EKG 12-Lead  . EKG 12-Lead  . EKG 12-Lead  . EKG 12-Lead  . EKG 12-Lead  . EKG 12-Lead  . EKG  . EKG 12-Lead in AM  . EKG 12-Lead in AM     Prior Assessment and Plan Problem List as of 10/11/2015      Cardiovascular and Mediastinum   Essential hypertension, benign   BRADYCARDIA   Chronic CHF (congestive heart failure) (HCC)   Atrial flutter with controlled response (HCC)   Systolic HF (heart failure) (HCC)     Respiratory   Pleural effusion on right   Hypoxia     Digestive   Nausea vomiting and diarrhea     Musculoskeletal and Integument   Pathologic fracture     Genitourinary   ESRD on dialysis ([place])   Chronic renal disease, stage 5, glomerular filtration rate less than or equal to 15 mL/min/1.73 square meter (HCC)   Anemia in chronic kidney disease   Anemia, chronic renal failure   ESRD on hemodialysis ([place])     Hematopoietic and Hemostatic   Pancytopenia ([place])     Other   Multiple myeloma ([place])   Last Assessment & Plan 09/04/2015 Office Visit Edited 09/04/2015  5:30 PM by Baird Cancer, PA-C    Multiple myeloma, not currently active, S/P 2 autologous BM transplant at [facility] under the guidance of Dr. Marcell Anger (retired) with the second bone marrow transplant occuring in July 2013. Recent bone marrow demonstrates 5% plasma cells and he was seen by Dr. Norma Fredrickson who recommended starting Revlimid at a dose of 5 mg 21/28 days. Unfortunately, due to patient's complaints and Revlimid-induced rash, patient was taking  Revlimid 5 mg every other day.  HOWEVER, recent increase is platelet transfusion needs was concerning.  As a result, this lead to repeat bone marrow aspiration and biopsy on 07/17/2015.  Oncology history updated.  He has seen Dr. 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   PLAN:  Kappa light chain multiple myeloma. s/p autologous stem cell transplant currently off treatment with minimal abnormal light chain ratio that has remained stable. Despite of having ESRD and on HD, a serum free light chain ratio above 3 is considered abnormal and therefore can't be considered to be in remission although he is behaving as if he is with no evidence of marrow involvement. Would suggest treating him as a remission but holding off any maintenance therapy due to cytopenias.    Pancytopenia: his counts continue to deteriorate and despite of his recent bone marrow biopsy showing no definite evidence of myelodysplastic syndrome, he is behaving like one clinically. I recommend he receive platelet transfusion while receiving HD and repeat a bone marrow biopsy next month if he remains pancytopenic. His copper, iron, folate, and B12 levels are normal and his zinc level is borderline low. Not enough to explain cytopenias.    Chronic kidney failure: on HD and receiving Procrit and managed by renal. 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    Neuropathy: borderline controlled with Lyrica 75 mg. He is requesting alternative therapy. May consider Cymbalta instead.   Health maintenance: He has not seen a dentist of eye doctor  in over a year. He was encouraged to do some cardiovascular exercise to prevent further deconditioning as his strength will not improve with his sedentary lifestyle. Recommend performing bone density scan to evaluate for osteoporosis as a  complication from his myeloma therapy.   He will return to his local oncologist in [place] and will return to see [place] in a month to perform biopsy   PLTS: 08/28/2015 08/19/2015 08/12/2015 08/05/2015 07/29/2015 07/24/2015 07/15/2015 07/08/2015 06/21/2015 06/05/2015 12/23/201 04/26/2015  RBC: 08/28/2015 08/12/2015 08/05/2015 07/30/2015 07/10/2015 06/21/2015 06/05/2015 05/03/2015 04/10/2015  For some reason, [facility] thinks Dr. Tressie Stalker is still the patient's primary medical oncologist.  We will advise them that he is not and all communication should be sent to [facility].  I was able to discuss this with Willa Frater, NP.  I provided her our information, including the patient's primary local medical oncology, Dr. Ancil Linsey.  I have provided her with an accurate fax # as well.  Additionally, I have provided Ms. Zenaida Niece, NP assistance with scheduling of IR bone marrow aspiration and biopsy at [facility].  She was thankful for this help.  Based upon [facility] recommendations we will do the following:  Bone density exam  Will assist [facility] in scheduling IR bone marrow aspiration and biopsy at [facility].  He will continue with CBC's every 1 week.  He has follow-up with [facility] on 09/25/2015.  Therefore, I will get his bone marrow aspiration and biopsy set up for end of April, very beginning of May.  This will need to be coordinated with his hemodialysis dates (Tues, Thurs, Sat).  We have referred the patient to podiatry, but insurance will not accept our referral as we are not primary care providers.  He has been referred to the free clinic.  He will return in ~ 6 weeks for follow-up with scheduled appointment at [facility] following bone marrow aspiration and biopsy.  ADDENDUM: Platelet count today is 13,000. Patient is scheduled for a platelet transfusion on Friday in order to not interrupt hemodialysis schedule.      Intravenous pamidronate causing adverse effect in  therapeutic use   Unspecified vitamin D deficiency   Lymphedema of lower extremity   Anemia of chronic disease   Thrombocytopenia (HCC)   Bleeding   Anemia   Weakness generalized   Elevated troponin   Abnormal EKG   Fluid overload       Imaging: Dg Chest 2 View  10/01/2015  CLINICAL DATA:  70 year old male, dialysis patient, with shortness of breath. EXAM: CHEST  2 VIEW COMPARISON:  Chest radiograph dated 07/14/2015 FINDINGS: Small bilateral pleural effusions appear increased compared to prior study. Bibasilar densities may represent atelectatic changes or pneumonia. No pneumothorax. There is stable cardiomegaly. Right pectoral central venous catheter with tip over central SVC. Right axillary vascular stent. There is osteopenia with degenerative changes of the spine. Lower thoracic vertebroplasty changes. IMPRESSION: Small bilateral pleural effusions as well as bibasilar atelectasis/ infiltrate, with interval progression compared to the prior study. Electronically Signed   By: Anner Crete M.D.   On: 10/01/2015 02:05   Ct Biopsy  09/18/2015  INDICATION: 70 year old male with a history of multiple myeloma. He has undergone prior bone marrow transplantation. Repeat bone marrow biopsy requested to evaluate for recurrence. EXAM: CT GUIDED BONE MARROW ASPIRATION AND CORE BIOPSY Interventional Radiologist:  Criselda Peaches, MD MEDICATIONS: None. ANESTHESIA/SEDATION: Moderate (conscious) sedation was employed during this procedure. A total of 1.5 milligrams versed and 50 micrograms fentanyl were administered intravenously.  The patient's level of consciousness and vital signs were monitored continuously by radiology nursing throughout the procedure under my direct supervision. Total monitored sedation time: 10 minutes FLUOROSCOPY TIME:  None COMPLICATIONS: None immediate. Estimated blood loss: <25 mL PROCEDURE: Informed written consent was obtained from the patient after a thorough discussion of  the procedural risks, benefits and alternatives. All questions were addressed. Maximal Sterile Barrier Technique was utilized including caps, mask, sterile gowns, sterile gloves, sterile drape, hand hygiene and skin antiseptic. A timeout was performed prior to the initiation of the procedure. The patient was positioned prone and non-contrast localization CT was performed of the pelvis to demonstrate the iliac marrow spaces. Maximal barrier sterile technique utilized including caps, mask, sterile gowns, sterile gloves, large sterile drape, hand hygiene, and betadine prep. Under sterile conditions and local anesthesia, an 11 gauge coaxial bone biopsy needle was advanced into the right iliac marrow space. Needle position was confirmed with CT imaging. Initially, bone marrow aspiration was performed. Next, the 11 gauge outer cannula was utilized to obtain a right iliac bone marrow core biopsy. Needle was removed. Hemostasis was obtained with compression. The patient tolerated the procedure well. Samples were prepared with the cytotechnologist. IMPRESSION: Technically successful CT-guided bone marrow biopsy. Electronically Signed   By: Jacqulynn Cadet M.D.   On: 09/18/2015 11:37

## 2015-10-11 NOTE — Progress Notes (Signed)
Cardiology Office Note   Date:  10/11/2015   ID:  [name], DOB [birth year], MRN [id number]  PCP:  No PCP Per Patient  Cardiologist: [place]/  Jory Sims, NP   Chief Complaint  Patient presents with  . Atrial Fibrillation  . Congestive Heart Failure      History of Present Illness: [name] is a 70 y.o. male who presents for ongoing assessment and management of atrial flutter and systolic dysfunction with most recent EF of 25%. The patient has a complex medical history including multiple myeloma and bone marrow transplant x2 with chemotherapy, ESRD. Other history includes anemia and thrombocytopenia. The patient is transfusion dependent.   The patient had a complicated admission to [facility] hospital, which included an EP evaluation and cardiac catheterization. The patient underwent a TEE on 10/03/2015 which did not reveal any left atrial thrombus, there was severe global LV hypokinesis with an EF of 20%. The patient was cleared for EPS. This was completed by Dr. Osie Cheeks on 10/03/2015. Dominant isthmus-dependent right atrial flutter, with successful radiofrequency ablation of atrial flutter with no inducible arrhythmias following ablation.he was started on Coumadin. This will need to be continued for 2 weeks post ablation.  Due to multiple co-morbidities the patient was not found to be a candidate for catheterization, and would not be able to tolerate dual antiplatelet therapy if PCI was needed. He is placed on medical therapy with ACE inhibitor.the patient also required dialysis for ESRD, with atrial fibrillation noted post dialysis. He was placed on beta blocker, and Coumadin was continued with recommendations for indefinite duration in the setting of atrial fibrillation..  On 10/09/2015, the patient was seen in the emergency room in the setting of bleeding right chest port and right upper extremity dialysis access fistula bleed. Coumadin was held for 48 hours, INR was  found to be 4.36.   He is here today with out any further complaints of bleeding. He denies chest pain, or dyspnea. He has dialysis on Tuesdays Thursdays and Saturdays and will be seen by them tomorrow.  Past Medical History  Diagnosis Date  . Hypertension   . Ruptured cervical disc   . Fall resulting in striking against other object     paralyzed  . Multiple myeloma   . Recurrent multiple myeloma of bone marrow with unknown EBV status ([place])   . Multiple myeloma ([place]) 01/26/2011  . Unspecified vitamin D deficiency 03/06/2013  . Anemia of chronic disease 12/30/2013  . Constipation   . Dialysis patient ([place]) 2016  . Thrombocytopenia ([place]) 08/31/2014  . 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 Constipation   . Hypoxia 09/2015    Past Surgical History  Procedure Laterality Date  . Inguinal hernia repair Bilateral   . Anterior cervical decomp/discectomy fusion    . Bone marrow aspirate and biopsy wiith lumbar puncture    . Melanoma excision      rt. breast  . Cervical spine surgery    . Kyphoplasty Bilateral 01/30/2014    Procedure: Thoracic eleven Kyphoplasty;  Surgeon: Consuella Lose, MD;  Location: [facility];  Service: Neurosurgery;  Laterality: Bilateral;  Thoracic eleven Kyphoplasty  . Av fistula placement Right 04/30/2014    Procedure: BRACHIOCEPHALIC ARTERIOVENOUS (AV) FISTULA CREATION;  Surgeon: Conrad [place], MD;  Location: [place];  Service: Vascular;  Laterality: Right;  . Portacath placement Left 02/18/2015  Procedure: INSERTION OF PORT A CATH RIGHT INTERNAL JUGULAR VEIN ;  Surgeon: Conrad [place], MD;  Location: [place];  Service: Vascular;  Laterality: Left;  . Cataract extraction w/phaco Right 04/15/2015    Procedure: CATARACT EXTRACTION PHACO AND INTRAOCULAR LENS PLACEMENT (IOC);  Surgeon: Tonny Branch, MD;  Location: [facility];  Service: Ophthalmology;  Laterality:  Right;  CDE:8.97  . Cataract extraction w/phaco Left 04/29/2015    Procedure: CATARACT EXTRACTION PHACO AND INTRAOCULAR LENS PLACEMENT ; CDE:  7.61;  Surgeon: Tonny Branch, MD;  Location: [facility];  Service: Ophthalmology;  Laterality: Left;  . Electrophysiologic study N/A 10/03/2015    Procedure: A-Flutter Ablation;  Surgeon: Evans Lance, MD;  Location: [place] [facility];  Service: Cardiovascular;  Laterality: N/A;     Current Outpatient Prescriptions  Medication Sig Dispense Refill  . amLODipine (NORVASC) 5 MG tablet Take 5 mg by mouth daily.    . carvedilol (COREG) 3.125 MG tablet Take 1 tablet (3.125 mg total) by mouth 2 (two) times daily with a meal. 60 tablet 0  . cinacalcet (SENSIPAR) 30 MG tablet Take 30 mg by mouth at bedtime.    . docusate sodium (COLACE) 100 MG capsule Take 100 mg by mouth 2 (two) times daily.    Marland Kitchen HYDROcodone-acetaminophen (NORCO/VICODIN) 5-325 MG tablet Take 1 tablet by mouth every 6 (six) hours as needed for severe pain. 60 tablet 0  . lisinopril (PRINIVIL,ZESTRIL) 20 MG tablet Take 20 mg by mouth every morning.     Marland Kitchen LYRICA 75 MG capsule TAKE 1 CAPSULE BY MOUTH TWICE DAILY. 60 capsule 2  . ondansetron (ZOFRAN) 8 MG tablet Take 1 tablet (8 mg total) by mouth every 8 (eight) hours as needed for nausea or vomiting. 30 tablet 1  . polyethylene glycol (MIRALAX / GLYCOLAX) packet Take 17 g by mouth 2 (two) times daily. 60 each 0  . torsemide (DEMADEX) 20 MG tablet Take 40 mg by mouth daily.      No current facility-administered medications for this visit.    Allergies:   Pamidronate    Social History:  The patient  reports that he has never smoked. He has never used smokeless tobacco. He reports that he does not drink alcohol or use illicit drugs.   Family History:  The patient's family history includes Cancer in his brother and sister.    ROS: All other systems are reviewed and negative. Unless otherwise mentioned in H&P    PHYSICAL EXAM: VS:  BP 110/72  mmHg  Pulse 53  Ht 5' 11"  (1.803 m)  Wt 234 lb (106.142 kg)  BMI 32.65 kg/m2  SpO2 97% , BMI Body mass index is 32.65 kg/(m^2). GEN: Well nourished, well developed, in no acute distress HEENT: normal Neck: no JVD, carotid bruits, or masses Cardiac: RRR; no murmurs, rubs, or gallops,no edema  Respiratory:  clear to auscultation bilaterally, normal work of breathing GI: soft, nontender, nondistended, + BS MS: no deformity or atrophyscabbing noted around AV fistula site in the upper right arm. Port-A-Cath site clean. Skin: warm and dry, no rash Neuro:  Strength and sensation are intact Psych: euthymic mood, full affect  Recent Labs: 02/23/2015: TSH 2.981 10/05/2015: ALT 13*; BUN 71*; Creatinine, Ser 6.66*; Potassium 4.8; Sodium 139 10/09/2015: Hemoglobin 8.9*; Platelets 28*    Lipid Panel    Component Value Date/Time   CHOL 166 03/03/2013 0858   TRIG 238* 03/03/2013 0858   HDL 28* 03/03/2013 0858   CHOLHDL 5.9 03/03/2013 0858  VLDL 48* 03/03/2013 0858   LDLCALC 90 03/03/2013 0858      Wt Readings from Last 3 Encounters:  10/11/15 234 lb (106.142 kg)  10/09/15 229 lb (103.874 kg)  10/05/15 230 lb 6.1 oz (104.5 kg)     ASSESSMENT AND PLAN:  1. Atrial flutter/fib: on assessment today heart rate is regular. He is tolerating carvedilol 3.125 mg twice a day. I will stop Coumadin. I have had consultation with Dr. Crissie Sickles in person, we have reviewed the patient's case. Dr. Lovena Le advises that Coumadin be discontinued indefinitely. His high risk of bleeding, demonstrated by ER visit, is more of a danger than having a CVA, and Dr. Tanna Furry opinion. I have discussed this with the patient who is in agreement and no longer wishes to take the Coumadin as well. I'm going to have a followup CBC drawn.  2. Systolic Dysfunction: echocardiogram during hospitalization revealed an EF of 20% with global LV hypokinesis. The patient will continue dialysis for volume management. He will see Dr.  Lovena Le on followup to discuss the need or consideration for ICD in the setting of severe systolic dysfunction.  3. Hypertension: blood pressure is currently well-controlled, low normal which is preferred with severe systolic dysfunction. He will continue ACE inhibitor.  4. Chronic anemia with multiple myeloma.he is followed by oncology/hematology.   Current medicines are reviewed at length with the patient today.    Labs/ tests ordered today include: CBC  Orders Placed This Encounter  Procedures  . Ambulatory referral to Podiatry     Disposition:   FU with Dr. Crissie Sickles in one to 2 months                        Followup with Dr. Bronson Ing to be established with cardiologist in [place] in 3 months  Signed, Jory Sims, NP  10/11/2015 2:41 PM    [facility]. [address] [place], [place] [address] Phone: [phone]; Fax: [phone]

## 2015-10-11 NOTE — Patient Instructions (Signed)
Your physician recommends that you schedule a follow-up appointment in: 3 Months with Dr. Bronson Ing  Your physician recommends that you schedule a follow-up appointment in: 1-2 Months with Dr. Lovena Le  Your physician has recommended you make the following change in your medication:   Stop Taking Coumadin  You have been referred to [facility]  If you need a refill on your cardiac medications before your next appointment, please call your pharmacy.  Thank you for choosing [place]!

## 2015-10-15 ENCOUNTER — Other Ambulatory Visit (HOSPITAL_COMMUNITY): Payer: Medicare Other

## 2015-10-16 ENCOUNTER — Encounter (HOSPITAL_BASED_OUTPATIENT_CLINIC_OR_DEPARTMENT_OTHER): Payer: Medicare Other

## 2015-10-16 ENCOUNTER — Encounter: Payer: Medicare Other | Admitting: Physician Assistant

## 2015-10-16 ENCOUNTER — Encounter (HOSPITAL_COMMUNITY): Payer: Medicare Other

## 2015-10-16 ENCOUNTER — Other Ambulatory Visit (HOSPITAL_COMMUNITY): Payer: Self-pay | Admitting: Oncology

## 2015-10-16 VITALS — BP 126/58 | HR 68 | Temp 97.6°F | Resp 16

## 2015-10-16 DIAGNOSIS — D696 Thrombocytopenia, unspecified: Secondary | ICD-10-CM

## 2015-10-16 DIAGNOSIS — C9000 Multiple myeloma not having achieved remission: Secondary | ICD-10-CM | POA: Diagnosis not present

## 2015-10-16 LAB — PREPARE RBC (CROSSMATCH)

## 2015-10-16 LAB — CBC WITH DIFFERENTIAL/PLATELET
BASOS ABS: 0 10*3/uL (ref 0.0–0.1)
Basophils Relative: 0 %
EOS PCT: 2 %
Eosinophils Absolute: 0.1 10*3/uL (ref 0.0–0.7)
HEMATOCRIT: 24 % — AB (ref 39.0–52.0)
Hemoglobin: 8 g/dL — ABNORMAL LOW (ref 13.0–17.0)
LYMPHS ABS: 1 10*3/uL (ref 0.7–4.0)
LYMPHS PCT: 32 %
MCH: 37.6 pg — AB (ref 26.0–34.0)
MCHC: 33.3 g/dL (ref 30.0–36.0)
MCV: 112.7 fL — AB (ref 78.0–100.0)
MONO ABS: 0.3 10*3/uL (ref 0.1–1.0)
Monocytes Relative: 8 %
NEUTROS ABS: 1.8 10*3/uL (ref 1.7–7.7)
NEUTROS PCT: 58 %
PLATELETS: 10 10*3/uL — AB (ref 150–400)
RBC: 2.13 MIL/uL — ABNORMAL LOW (ref 4.22–5.81)
WBC: 3.1 10*3/uL — ABNORMAL LOW (ref 4.0–10.5)

## 2015-10-16 LAB — SAMPLE TO BLOOD BANK

## 2015-10-16 MED ORDER — DIPHENHYDRAMINE HCL 50 MG/ML IJ SOLN: 25.0000 mg | Freq: Once | INTRAMUSCULAR | Status: DC

## 2015-10-16 MED ORDER — SODIUM CHLORIDE 0.9% FLUSH: 3.0000 mL | INTRAVENOUS | Status: DC | PRN

## 2015-10-16 MED ORDER — DIPHENHYDRAMINE HCL 25 MG PO CAPS
ORAL_CAPSULE | ORAL | Status: AC
  Filled 2015-10-16: qty 1

## 2015-10-16 MED ORDER — HEPARIN SOD (PORK) LOCK FLUSH 100 UNIT/ML IV SOLN: 500.0000 [IU] | Freq: Every day | INTRAVENOUS | Status: DC | PRN

## 2015-10-16 MED ORDER — HEPARIN SOD (PORK) LOCK FLUSH 100 UNIT/ML IV SOLN: 250.0000 [IU] | INTRAVENOUS | Status: DC | PRN

## 2015-10-16 MED ORDER — SODIUM CHLORIDE 0.9 % IV SOLN: 250.0000 mL | Freq: Once | INTRAVENOUS | Status: DC

## 2015-10-16 MED ORDER — DIPHENHYDRAMINE HCL 25 MG PO CAPS
25.0000 mg | ORAL_CAPSULE | Freq: Once | ORAL | Status: AC
  Administered 2015-10-16: 25 mg via ORAL

## 2015-10-16 MED ORDER — ACETAMINOPHEN 325 MG PO TABS: 650.0000 mg | ORAL_TABLET | Freq: Once | ORAL | Status: DC

## 2015-10-16 MED ORDER — SODIUM CHLORIDE 0.9 % IV SOLN
250.0000 mL | Freq: Once | INTRAVENOUS | Status: AC
  Administered 2015-10-16: 250 mL via INTRAVENOUS

## 2015-10-16 MED ORDER — HEPARIN SOD (PORK) LOCK FLUSH 100 UNIT/ML IV SOLN
500.0000 [IU] | Freq: Every day | INTRAVENOUS | Status: AC | PRN
  Administered 2015-10-16: 500 [IU]

## 2015-10-16 MED ORDER — SODIUM CHLORIDE 0.9% FLUSH: 10.0000 mL | INTRAVENOUS | Status: DC | PRN

## 2015-10-16 MED ORDER — ACETAMINOPHEN 325 MG PO TABS
650.0000 mg | ORAL_TABLET | Freq: Once | ORAL | Status: AC
  Administered 2015-10-16: 650 mg via ORAL

## 2015-10-16 MED ORDER — ACETAMINOPHEN 325 MG PO TABS
ORAL_TABLET | ORAL | Status: AC
  Filled 2015-10-16: qty 2

## 2015-10-16 NOTE — Progress Notes (Signed)
Patient tolerated transfusion well.  VSS.

## 2015-10-16 NOTE — Patient Instructions (Signed)
[place] at [facility] Discharge Instructions  RECOMMENDATIONS MADE BY THE CONSULTANT AND ANY TEST RESULTS WILL BE SENT TO YOUR REFERRING PHYSICIAN.  One unit of platelets.    Thank you for choosing [place] at [facility] to provide your oncology and hematology care.  To afford each patient quality time with our provider, please arrive at least 15 minutes before your scheduled appointment time.   Beginning January 23rd 2017 lab work for the [facility] will be done in the  Main lab at [facility] on 1st floor. If you have a lab appointment with the [place] please come in thru the  Main Entrance and check in at the main information desk  You need to re-schedule your appointment should you arrive 10 or more minutes late.  We strive to give you quality time with our providers, and arriving late affects you and other patients whose appointments are after yours.  Also, if you no show three or more times for appointments you may be dismissed from the clinic at the providers discretion.     Again, thank you for choosing [facility].  Our hope is that these requests will decrease the amount of time that you wait before being seen by our physicians.       _____________________________________________________________  Should you have questions after your visit to [facility], please contact our office at [phone] between the hours of 8:30 a.m. and 4:30 p.m.  Voicemails left after 4:30 p.m. will not be returned until the following business day.  For prescription refill requests, have your pharmacy contact our office.         Resources For Cancer Patients and their Caregivers ? American Cancer Society: Can assist with transportation, wigs, general needs, runs Look Good Feel Better.        (850)229-8428 ? Cancer Care: Provides financial assistance, online support groups, medication/co-pay assistance.   1-800-813-HOPE 603-216-7415) ? [name] Assists [place] cancer patients and their families through emotional , educational and financial support.  (534) 465-2317 ? [place] DSS Where to apply for food stamps, Medicaid and utility assistance. 401-378-4049 ? [facility]: Transportation to medical appointments. 269-303-5550 ? Social Security Administration: May apply for disability if have a Stage IV cancer. 610 016 1650 (628)457-0116 ? [employer], Disability and Transit Services: Assists with nutrition, care and transit needs. [place] Support Programs: @10RELATIVEDAYS @ > Cancer Support Group  2nd Tuesday of the month 1pm-2pm, Journey Room  > Creative Journey  3rd Tuesday of the month 1130am-1pm, Journey Room  > Look Good Feel Better  1st Wednesday of the month 10am-12 noon, Journey Room (Call [place] to register [phone])

## 2015-10-16 NOTE — Progress Notes (Signed)
Cardiology Office Note Date:  10/16/2015  Patient ID:  [name], [name] Sep 18, 1945, MRN [id number] PCP:  No PCP Per Patient  Cardiologist:  Dr. Sallyanne Kuster  **refresh   Chief Complaint: **  History of Present Illness: [name] is a 70 y.o. male with history of Aflutter s/p ablation with Dr. 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  10/09/15 he had an ER visit with bleeding at his L chest port and AVF his INE supratherapeutic at 4.36, his coumadin adjusted, and has since seen cardiology, K. Lawrence, NP on 10/11/15 without further bleeding.  He comes today to be seen for Dr. Lovena Le, he is   ** bleeding ** who is managing coumadin ** procedure sites ** oncologist?   Past Medical History  Diagnosis Date  . Hypertension   . Ruptured cervical disc   . Fall resulting in striking against other object     paralyzed  . Multiple myeloma   . Recurrent multiple myeloma of bone marrow with unknown EBV status ([place])   . Multiple myeloma ([place]) 01/26/2011  . Unspecified vitamin D deficiency 03/06/2013  . Anemia of chronic disease 12/30/2013  . Constipation   . Dialysis patient ([place]) 2016  . Thrombocytopenia ([place]) 08/31/2014  . 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 Constipation   . Hypoxia 09/2015    Past Surgical History  Procedure Laterality Date  . Inguinal hernia repair Bilateral   . Anterior cervical decomp/discectomy fusion    . Bone marrow aspirate and biopsy wiith lumbar puncture    . Melanoma excision      rt. breast  . Cervical spine surgery    . Kyphoplasty Bilateral 01/30/2014    Procedure: Thoracic eleven Kyphoplasty;   Surgeon: Consuella Lose, MD;  Location: [facility];  Service: Neurosurgery;  Laterality: Bilateral;  Thoracic eleven Kyphoplasty  . Av fistula placement Right 04/30/2014    Procedure: BRACHIOCEPHALIC ARTERIOVENOUS (AV) FISTULA CREATION;  Surgeon: Conrad Littlefork, MD;  Location: [facility];  Service: Vascular;  Laterality: Right;  . Portacath placement Left 02/18/2015    Procedure: INSERTION OF PORT A CATH RIGHT INTERNAL JUGULAR VEIN ;  Surgeon: Conrad [place], MD;  Location: [facility];  Service: Vascular;  Laterality: Left;  . Cataract extraction w/phaco Right 04/15/2015    Procedure: CATARACT EXTRACTION PHACO AND INTRAOCULAR LENS PLACEMENT (IOC);  Surgeon: Tonny Branch, MD;  Location: [facility];  Service: Ophthalmology;  Laterality: Right;  CDE:8.97  . Cataract extraction w/phaco Left 04/29/2015    Procedure: CATARACT EXTRACTION PHACO AND INTRAOCULAR LENS PLACEMENT ; CDE:  7.61;  Surgeon: Tonny Branch, MD;  Location: [facility];  Service: Ophthalmology;  Laterality: Left;  . Electrophysiologic study N/A 10/03/2015    Procedure: A-Flutter Ablation;  Surgeon: Evans Lance, MD;  Location: [facility];  Service: Cardiovascular;  Laterality: N/A;    Current Outpatient Prescriptions  Medication Sig Dispense Refill  . amLODipine (NORVASC) 5 MG tablet Take 5 mg by mouth daily.    . carvedilol (COREG) 3.125 MG tablet Take 1 tablet (3.125 mg total) by mouth 2 (two) times daily with a meal. 60 tablet 0  . cinacalcet (SENSIPAR) 30 MG tablet Take  30 mg by mouth at bedtime.    . docusate sodium (COLACE) 100 MG capsule Take 100 mg by mouth 2 (two) times daily.    Marland Kitchen HYDROcodone-acetaminophen (NORCO/VICODIN) 5-325 MG tablet Take 1 tablet by mouth every 6 (six) hours as needed for severe pain. 60 tablet 0  . lisinopril (PRINIVIL,ZESTRIL) 20 MG tablet Take 20 mg by mouth every morning.     Marland Kitchen LYRICA 75 MG capsule TAKE 1 CAPSULE BY MOUTH TWICE DAILY. 60 capsule 2  . ondansetron (ZOFRAN) 8 MG tablet Take 1 tablet (8 mg total)  by mouth every 8 (eight) hours as needed for nausea or vomiting. 30 tablet 1  . polyethylene glycol (MIRALAX / GLYCOLAX) packet Take 17 g by mouth 2 (two) times daily. 60 each 0  . torsemide (DEMADEX) 20 MG tablet Take 40 mg by mouth daily.      No current facility-administered medications for this visit.    Allergies:   Pamidronate   Social History:  The patient  reports that he has never smoked. He has never used smokeless tobacco. He reports that he does not drink alcohol or use illicit drugs.   Family History:  The patient's family history includes Cancer in his brother and sister.  ROS:  Please see the history of present illness.    All other systems are reviewed and otherwise negative.   PHYSICAL EXAM: ** VS:  There were no vitals taken for this visit. BMI: There is no weight on file to calculate BMI. Well nourished, well developed, in no acute distress HEENT: normocephalic, atraumatic Neck: no JVD, carotid bruits or masses Cardiac:  normal S1, S2; RRR; no significant murmurs, no rubs, or gallops Lungs:  clear to auscultation bilaterally, no wheezing, rhonchi or rales Abd: soft, nontender,  + BS MS: no deformity or atrophy Ext: no edema Skin: warm and dry, no rash Neuro:  No gross deficits appreciated Psych: euthymic mood, full affect  ** EP procedure sites stable  EKG:  Done today shows **  10/03/15: TEE Study Conclusions - Left ventricle: The cavity size was normal. Wall thickness was  normal. Systolic function was severely reduced. The estimated  ejection fraction was in the range of 20% to 25%. Diffuse  hypokinesis. - Aortic valve: No evidence of vegetation. - Mitral valve: No evidence of vegetation. There was mild  regurgitation. - Left atrium: The atrium was severely dilated. No evidence of  thrombus in the atrial cavity or appendage. No spontaneous echo  contrast was observed. - Right ventricle: Systolic function was mildly reduced. - Right atrium:  The atrium was moderately dilated. No evidence of  thrombus in the atrial cavity or appendage. - Atrial septum: No defect or patent foramen ovale was identified. - Tricuspid valve: No evidence of vegetation. There was  regurgitation directed centrally. There was mild-moderate  perivalvular regurgitation. - Pulmonic valve: No evidence of vegetation.  10/03/15: EPS CONCLUSIONS:  1. Isthmus-dependent right atrial flutter upon presentation.  2. Successful radiofrequency ablation of atrial flutter along the cavotricuspid isthmus with complete bidirectional isthmus block achieved.  3. No inducible arrhythmias following ablation.  4. No early apparent complications.    Recent Labs: 02/23/2015: TSH 2.981 10/05/2015: ALT 13*; BUN 71*; Creatinine, Ser 6.66*; Potassium 4.8; Sodium 139 10/11/2015: Hemoglobin 9.0*; Platelets 17*  No results found for requested labs within last 365 days.   Estimated Creatinine Clearance: 13.2 mL/min (by C-G formula based on Cr of 6.66).   Wt Readings from Last 3 Encounters:  10/11/15 234 lb (  106.142 kg)  10/09/15 229 lb (103.874 kg)  10/05/15 230 lb 6.1 oz (104.5 kg)     Other studies reviewed: Additional studies/records reviewed today include: summarized above**  ASSESSMENT AND PLAN:  ** Coumadin or OAC Labs, CHADS  1. AFlutter s/p ABlation >>> AFib     CHA2DS2Vasc is at least 3 on warfarin, monitored and managed by **     10/11/15 H/H 9/26, plts 17  2. CM, uncertain etiology, ?chemo     ** exam is euvolemic    On BB/ACE, fluid status managed with HD  3. ESRF on HD   4. Multiple myeloma    Chronic anemia, thrombocytopenia requiring plt transdusions  Disposition: F/u with **  Current medicines are reviewed at length with the patient today.  The patient did not have any concerns regarding medicines.*  Haywood Lasso, PA-C 10/16/2015 5:48 AM     [facility] [address] [place] [address] [phone]  (office)  [phone] (fax)    This encounter was created in error - please disregard.

## 2015-10-17 LAB — PREPARE PLATELET PHERESIS: Unit division: 0

## 2015-10-17 NOTE — Progress Notes (Unsigned)
CRITICAL VALUE ALERT Critical value received:  Platelets 10,000 Date of notification:  10/16/2015 Time of notification: [id number] Critical value read back:  Yes.   Nurse who received alert:  Shellia Carwin RN MD notified (1st page):  Kirby Crigler PA

## 2015-10-18 ENCOUNTER — Encounter (HOSPITAL_COMMUNITY): Payer: Medicare Other

## 2015-10-18 ENCOUNTER — Ambulatory Visit (HOSPITAL_COMMUNITY): Payer: Medicare Other | Admitting: Oncology

## 2015-10-18 ENCOUNTER — Encounter (HOSPITAL_COMMUNITY): Payer: Medicare Other | Attending: Oncology | Admitting: Oncology

## 2015-10-18 ENCOUNTER — Encounter (HOSPITAL_COMMUNITY)
Admission: RE | Admit: 2015-10-18 | Discharge: 2015-10-18 | Disposition: A | Discharge status: Home / Self Care | Payer: Medicare Other | Source: Ambulatory Visit | Attending: Hematology & Oncology | Admitting: Hematology & Oncology

## 2015-10-18 VITALS — BP 139/79 | HR 53 | Temp 98.2°F | Resp 16

## 2015-10-18 DIAGNOSIS — Z9484 Stem cells transplant status: Secondary | ICD-10-CM

## 2015-10-18 DIAGNOSIS — D638 Anemia in other chronic diseases classified elsewhere: Secondary | ICD-10-CM | POA: Insufficient documentation

## 2015-10-18 DIAGNOSIS — R58 Hemorrhage, not elsewhere classified: Secondary | ICD-10-CM

## 2015-10-18 DIAGNOSIS — C9000 Multiple myeloma not having achieved remission: Secondary | ICD-10-CM | POA: Insufficient documentation

## 2015-10-18 MED ORDER — HEPARIN SOD (PORK) LOCK FLUSH 100 UNIT/ML IV SOLN
500.0000 [IU] | Freq: Once | INTRAVENOUS | Status: AC
  Administered 2015-10-18: 500 [IU] via INTRAVENOUS
  Filled 2015-10-18: qty 5

## 2015-10-18 MED ORDER — SODIUM CHLORIDE 0.9 % IV SOLN
Freq: Once | INTRAVENOUS | Status: AC
  Administered 2015-10-18: 10 mL/h via INTRAVENOUS

## 2015-10-18 MED ORDER — DIPHENHYDRAMINE HCL 50 MG/ML IJ SOLN
25.0000 mg | Freq: Once | INTRAMUSCULAR | Status: AC
  Administered 2015-10-18: 25 mg via INTRAVENOUS

## 2015-10-18 MED ORDER — ACETAMINOPHEN 325 MG PO TABS
650.0000 mg | ORAL_TABLET | Freq: Once | ORAL | Status: AC
Start: 2015-10-18 — End: 2015-10-18
  Administered 2015-10-18: 650 mg via ORAL

## 2015-10-18 MED ORDER — ACETAMINOPHEN 325 MG PO TABS
ORAL_TABLET | ORAL | Status: AC
  Filled 2015-10-18: qty 2

## 2015-10-18 MED ORDER — DIPHENHYDRAMINE HCL 50 MG/ML IJ SOLN
INTRAMUSCULAR | Status: AC
  Filled 2015-10-18: qty 1

## 2015-10-18 NOTE — Progress Notes (Signed)
Kirby Crigler, PA-C in to see patient in [facility]. Informed patient he does not need to come to cancer center post transfusion.

## 2015-10-18 NOTE — Progress Notes (Signed)
No PCP Per Patient No address on file  Anemia of chronic disease - Plan: Ferritin, CBC, DISCONTINUED: acetaminophen (TYLENOL) tablet 650 mg, DISCONTINUED: diphenhydrAMINE (BENADRYL) injection 25 mg  Bleeding  Multiple myeloma not having achieved remission ([place]) - Plan: CBC  CURRENT THERAPY: Therapy on HOLD  INTERVAL HISTORY: [name] 70 y.o. male returns for followup of multiple myeloma, not active at this time, S/P 2 autologous BM transplant at [facility] under the guidance of Dr. Marcell Anger (retired) with the second bone marrow transplant occuring in July 2013. Recent bone marrow demonstrates 5% plasma cells and he was seen by Dr. Norma Fredrickson who recommended starting Revlimid at a dose of 5 mg 21/28 days. Unfortunately, due to patient's complaints and Revlimid-induced rash, patient was taking Revlimid 5 mg every other day. HOWEVER, recent increase is platelet transfusion needs was concerning. As a result, this lead to repeat bone marrow aspiration and biopsy and referral back to [facility] to see Dr. Norma Fredrickson.  Revlimid on HOLD.    Multiple myeloma ([place])   01/26/2011 Initial Diagnosis Multiple myeloma   09/10/2014 - 09/14/2014 Chemotherapy Revlimid 5 mg days 1-21 every 28 days   09/14/2014 Adverse Reaction Rash, Revlimid versus Doxycycline   09/21/2014 - 09/23/2014 Chemotherapy Revlimid 5 mg days 1-21 every 28 days- rechallenge.   09/24/2014 Adverse Reaction Rash.  Revlimid-induced   10/22/2014 - 07/24/2015 Chemotherapy Revlimid 5 mg every other day.   07/14/2015 - 07/16/2015 Hospital Admission Nausea with vomiting secondary to viral gastroenteritis.   07/17/2015 Bone Marrow Biopsy By IR   07/17/2015 Pathology Results VARIABLY CELLULAR BONE MARROW WITH RELATIVE ABUNDANCE OF PLASMA CELLS. - GRANULOCYTIC AND MEGAKARYOCYTIC HYPOPLASIA   07/17/2015 Pathology Results The bone marrow is variably cellular but many aspirate particles are hypocellular with relative abundance of plasma cells representing 18% of  all cells in the aspirate associated with interstitial cells and small clusters in the clot and biopsy sections   07/17/2015 Pathology Results Immunohistochemical stains show polyclonal staining pattern for kappa and lambda light chains in plasma cells. The latter findings are considered nonspecific and not diagnostic of plasma cell neoplasm/dyscrasia.   07/17/2015 Pathology Results The myeloid findings are nonspecific and may be regenerative as a result of chemotherapy/medication, infection, alcohol, renal disease etc.   07/17/2015 Pathology Results Normal cytogenetics.   07/24/2015 Treatment Plan Change Drug Holiday x 1 month; [facility]   08/26/2015 Miscellaneous Patient seen by Dr. Norma Fredrickson [facility] for second opinion regarding multiple myeloma and current transfusion needs.   09/09/2015 Imaging Bone density-  This patient is considered NORMAL by [facility]) Criteria. (L-4 was excluded due to advanced degenerative changes.)   10/01/2015 - 10/05/2015 Hospital Admission Atrial flutter with controlled response    10/03/2015 Procedure A-Flutter Ablation (N/A)   I personally reviewed and went over laboratory results with the patient.  The results are noted within this dictation.  Chart reviewed.  He received platelet transfusion on Wednesday.  He is getting 1 unit of PRBCs currently in Short Stay.  He is doing well.  No complaints today.  Review of Systems  Constitutional: Negative.   HENT: Negative for nosebleeds.   Eyes: Negative.   Respiratory: Negative.  Negative for hemoptysis and shortness of breath.   Cardiovascular: Negative.   Gastrointestinal: Negative.  Negative for nausea, vomiting, diarrhea, constipation, blood in stool and melena.  Genitourinary: Negative.  Negative for hematuria.  Musculoskeletal: Positive for neck pain (Chronic).  Skin: Negative.  Negative for  rash.  Neurological: Negative.   Endo/Heme/Allergies: Negative.     Psychiatric/Behavioral: Negative.     Past Medical History  Diagnosis Date  . Hypertension   . Ruptured cervical disc   . Fall resulting in striking against other object     paralyzed  . Multiple myeloma   . Recurrent multiple myeloma of bone marrow with unknown EBV status ([place])   . Multiple myeloma ([place]) 01/26/2011  . Unspecified vitamin D deficiency 03/06/2013  . Anemia of chronic disease 12/30/2013  . Constipation   . Dialysis patient ([place]) 2016  . Thrombocytopenia ([place]) 08/31/2014  . 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 Constipation   . Hypoxia 09/2015    Past Surgical History  Procedure Laterality Date  . Inguinal hernia repair Bilateral   . Anterior cervical decomp/discectomy fusion    . Bone marrow aspirate and biopsy wiith lumbar puncture    . Melanoma excision      rt. breast  . Cervical spine surgery    . Kyphoplasty Bilateral 01/30/2014    Procedure: Thoracic eleven Kyphoplasty;  Surgeon: Consuella Lose, MD;  Location: [facility];  Service: Neurosurgery;  Laterality: Bilateral;  Thoracic eleven Kyphoplasty  . Av fistula placement Right 04/30/2014    Procedure: BRACHIOCEPHALIC ARTERIOVENOUS (AV) FISTULA CREATION;  Surgeon: Conrad Whittemore, MD;  Location: [place];  Service: Vascular;  Laterality: Right;  . Portacath placement Left 02/18/2015    Procedure: INSERTION OF PORT A CATH RIGHT INTERNAL JUGULAR VEIN ;  Surgeon: Conrad Winfield, MD;  Location: [place];  Service: Vascular;  Laterality: Left;  . Cataract extraction w/phaco Right 04/15/2015    Procedure: CATARACT EXTRACTION PHACO AND INTRAOCULAR LENS PLACEMENT (IOC);  Surgeon: Tonny Branch, MD;  Location: [facility];  Service: Ophthalmology;  Laterality: Right;  CDE:8.97  . Cataract extraction w/phaco Left 04/29/2015    Procedure: CATARACT EXTRACTION PHACO AND INTRAOCULAR LENS PLACEMENT ; CDE:  7.61;  Surgeon: Tonny Branch, MD;  Location: [facility];  Service: Ophthalmology;  Laterality: Left;  . Electrophysiologic study N/A 10/03/2015    Procedure: A-Flutter Ablation;  Surgeon: Evans Lance, MD;  Location: [place] [facility];  Service: Cardiovascular;  Laterality: N/A;    Family History  Problem Relation Age of Onset  . Cancer Sister   . Cancer Brother     Social History   Social History  . Marital Status: Divorced    Spouse Name: N/A  . Number of Children: N/A  . Years of Education: N/A   Social History Main Topics  . Smoking status: Never Smoker   . Smokeless tobacco: Never Used  . Alcohol Use: No  . Drug Use: No  . Sexual Activity: Not on file   Other Topics Concern  . Not on file   Social History Narrative     PHYSICAL EXAMINATION  ECOG PERFORMANCE STATUS: 1 - Symptomatic but completely ambulatory  There were no vitals filed for this visit.  Vitals ascertained in Short Stay during PRBC transfusion.  GENERAL:alert, no distress, well nourished, well developed, comfortable, cooperative, obese, smiling and unaccompanied, in a recliner in Short Stay getting set-up by nursing for 1 unit PRBC transfusion. SKIN: skin color, texture, turgor are normal, no rashes or significant lesions HEAD: Normocephalic, No masses, lesions, tenderness or abnormalities EYES: normal, EOMI, Conjunctiva are pink and non-injected EARS: External ears normal OROPHARYNX:lips, buccal mucosa, and tongue normal  and mucous membranes are moist  NECK: supple, trachea midline LYMPH:  not examined BREAST:not examined LUNGS: clear to auscultation  HEART: regular rate & rhythm ABDOMEN:abdomen soft BACK: Back symmetric, no curvature. EXTREMITIES:less then 2 second capillary refill, no skin discoloration, no cyanosis  NEURO: alert & oriented x 3 with fluent speech, no focal motor/sensory deficits   LABORATORY DATA: CBC    Component Value Date/Time   WBC 3.1* 10/16/2015 0957   RBC 2.13* 10/16/2015 0957   HGB 8.0*  10/16/2015 0957   HCT 24.0* 10/16/2015 0957   PLT 10* 10/16/2015 0957   MCV 112.7* 10/16/2015 0957   MCH 37.6* 10/16/2015 0957   MCHC 33.3 10/16/2015 0957   RDW 22.9* 10/07/2015 1146   LYMPHSABS 1.0 10/16/2015 0957   MONOABS 0.3 10/16/2015 0957   EOSABS 0.1 10/16/2015 0957   BASOSABS 0.0 10/16/2015 0957      Chemistry      Component Value Date/Time   NA 139 10/05/2015 0440   K 4.8 10/05/2015 0440   CL 100* 10/05/2015 0440   CO2 27 10/05/2015 0440   BUN 71* 10/05/2015 0440   CREATININE 6.66* 10/05/2015 0440   CREATININE 43.09* 01/26/2012 1052      Component Value Date/Time   CALCIUM 10.0 10/05/2015 0440   ALKPHOS 55 10/05/2015 0440   AST 15 10/05/2015 0440   ALT 13* 10/05/2015 0440   BILITOT 0.8 10/05/2015 0440        PENDING LABS:   RADIOGRAPHIC STUDIES:  Dg Chest 2 View  10/01/2015  CLINICAL DATA:  70 year old male, dialysis patient, with shortness of breath. EXAM: CHEST  2 VIEW COMPARISON:  Chest radiograph dated 07/14/2015 FINDINGS: Small bilateral pleural effusions appear increased compared to prior study. Bibasilar densities may represent atelectatic changes or pneumonia. No pneumothorax. There is stable cardiomegaly. Right pectoral central venous catheter with tip over central SVC. Right axillary vascular stent. There is osteopenia with degenerative changes of the spine. Lower thoracic vertebroplasty changes. IMPRESSION: Small bilateral pleural effusions as well as bibasilar atelectasis/ infiltrate, with interval progression compared to the prior study. Electronically Signed   By: Anner Crete M.D.   On: 10/01/2015 02:05     PATHOLOGY:    ASSESSMENT AND PLAN:  Multiple myeloma ([place]) Multiple myeloma, not currently active, S/P 2 autologous BM transplant at [facility] under the guidance of Dr. Marcell Anger (retired) with the second bone marrow transplant occuring in July 2013. Recent bone marrow demonstrates 5% plasma cells and he was seen by Dr. Norma Fredrickson who  recommended starting Revlimid at a dose of 5 mg 21/28 days. Unfortunately, due to patient's complaints and Revlimid-induced rash, patient was taking Revlimid 5 mg every other day.  HOWEVER, recent increase is platelet transfusion needs was concerning.  As a result, this lead to repeat bone marrow aspiration and biopsy on 07/17/2015.  Oncology history updated.  He has seen Dr. Norma Fredrickson at [facility] on 09/25/2015: PLAN:  Kappa light chain multiple myeloma. s/p autologous stem cell transplant currently off treatment with no evidence of residual disease other than abnormal free light chain ratio. Will remain off therapy. minimal abnormal light chain ratio that has remained stable. Would suggest continuing to hold therapy for myeloma.   Pancytopenia: normocellular marrow with pancytopenia and atypical looking cells in the marrow are very suggestive of MDS. He is transfusion dependent. Will perform flow on peripheral blood and ask FISH be done on recent bone marrow for MDS and leukemia. He has most features of MDS. Will continue with EPO and await  lab results fur further care besides transfusion support. He may benefit from decitabine 10 days every 30 days if he continues behaving like MDS.   Chronic kidney failure: on HD and receiving Procrit and managed by renal. 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   Iron overload: likely from transfusions and iron replacement. Spoke with nephrology and we both agree not to give further iron. Will keep transfusions for Hb < 8 g/dL due to renal failure.   Neuropathy: stable on current therapy.  Health maintenance: He has not seen a dentist of eye doctor in over a year. He was encouraged once more to do some cardiovascular exercise to prevent further deconditioning.  He will return to his local oncologist in [place] and will return to see [place] 10/28/15. Over 40 minutes were dedicated on today's visit.   Platelet transfusion over the past 3  months: 10/16/2015 10/08/2015 10/03/2015 10/02/2015 09/27/2015 09/23/2015 09/16/2015 09/11/2015 09/06/2015 08/28/2015 08/19/2015 08/12/2015 08/05/2015 07/29/2015 07/24/2015   RBC transfusion over the past 3 months: 10/18/2015 10/03/2015 10/02/2015 08/28/2015 08/12/2015 08/05/2015 07/30/2015  He will continue with CBC's every 1 week.  I will add a ferritin to his next lab check given multiple PRBC transfusions and high likelihood of iron overload.  He has follow-up at [facility] on 10/28/2015 with Dr. Norma Fredrickson.  He will return ~ 11/04/2015 for follow-up locally to review recommended plan of action according to Dr. Norma Fredrickson.    ORDERS PLACED FOR THIS ENCOUNTER: Orders Placed This Encounter  Procedures  . Ferritin  . CBC    MEDICATIONS PRESCRIBED THIS ENCOUNTER: No orders of the defined types were placed in this encounter.    THERAPY PLAN:  Continue to hold Revlimid while he undergoes evaluation at [facility] for pancytopenia and transfusion dependency.  All questions were answered. The patient knows to call the clinic with any problems, questions or concerns. We can certainly see the patient much sooner if necessary.  Patient and plan discussed with Dr. Ancil Linsey and she is in agreement with the aforementioned.   This note is electronically signed by: Doy Mince 10/18/2015 6:40 PM

## 2015-10-18 NOTE — Assessment & Plan Note (Addendum)
Multiple myeloma, not currently active, S/P 2 autologous BM transplant at [facility] under the guidance of Dr. Marcell Anger (retired) with the second bone marrow transplant occuring in July 2013. Recent bone marrow demonstrates 5% plasma cells and he was seen by Dr. Norma Fredrickson who recommended starting Revlimid at a dose of 5 mg 21/28 days. Unfortunately, due to patient's complaints and Revlimid-induced rash, patient was taking Revlimid 5 mg every other day.  HOWEVER, recent increase is platelet transfusion needs was concerning.  As a result, this lead to repeat bone marrow aspiration and biopsy on 07/17/2015.  Oncology history updated.  He has seen Dr. Norma Fredrickson at [facility] on 09/25/2015: PLAN:  Kappa light chain multiple myeloma. s/p autologous stem cell transplant currently off treatment with no evidence of residual disease other than abnormal free light chain ratio. Will remain off therapy. minimal abnormal light chain ratio that has remained stable. Would suggest continuing to hold therapy for myeloma.   Pancytopenia: normocellular marrow with pancytopenia and atypical looking cells in the marrow are very suggestive of MDS. He is transfusion dependent. Will perform flow on peripheral blood and ask FISH be done on recent bone marrow for MDS and leukemia. He has most features of MDS. Will continue with EPO and await lab results fur further care besides transfusion support. He may benefit from decitabine 10 days every 30 days if he continues behaving like MDS.   Chronic kidney failure: on HD and receiving Procrit and managed by renal. 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   Iron overload: likely from transfusions and iron replacement. Spoke with nephrology and we both agree not to give further iron. Will keep transfusions for Hb < 8 g/dL due to renal failure.   Neuropathy: stable on current therapy.  Health maintenance: He has not seen a dentist of eye doctor in over a year. He was encouraged  once more to do some cardiovascular exercise to prevent further deconditioning.  He will return to his local oncologist in [place] and will return to see [place] 10/28/15. Over 40 minutes were dedicated on today's visit.   Platelet transfusion over the past 3 months: 10/16/2015 10/08/2015 10/03/2015 10/02/2015 09/27/2015 09/23/2015 09/16/2015 09/11/2015 09/06/2015 08/28/2015 08/19/2015 08/12/2015 08/05/2015 07/29/2015 07/24/2015   RBC transfusion over the past 3 months: 10/18/2015 10/03/2015 10/02/2015 08/28/2015 08/12/2015 08/05/2015 07/30/2015  He will continue with CBC's every 1 week.  I will add a ferritin to his next lab check given multiple PRBC transfusions and high likelihood of iron overload.  He has follow-up at [facility] on 10/28/2015 with Dr. Norma Fredrickson.  He will return ~ 11/04/2015 for follow-up locally to review recommended plan of action according to Dr. Norma Fredrickson.

## 2015-10-19 LAB — TYPE AND SCREEN
ABO/RH(D): A POS
Antibody Screen: NEGATIVE
UNIT DIVISION: 0

## 2015-10-22 ENCOUNTER — Observation Stay (HOSPITAL_COMMUNITY)
Admission: EM | Admit: 2015-10-22 | Discharge: 2015-11-16 | Disposition: E | Payer: Medicare Other | Attending: Internal Medicine | Admitting: Internal Medicine

## 2015-10-22 ENCOUNTER — Emergency Department (HOSPITAL_COMMUNITY): Payer: Medicare Other

## 2015-10-22 ENCOUNTER — Encounter (HOSPITAL_COMMUNITY): Payer: Self-pay | Admitting: Emergency Medicine

## 2015-10-22 DIAGNOSIS — R1110 Vomiting, unspecified: Secondary | ICD-10-CM | POA: Diagnosis present

## 2015-10-22 DIAGNOSIS — R05 Cough: Secondary | ICD-10-CM | POA: Insufficient documentation

## 2015-10-22 DIAGNOSIS — R0981 Nasal congestion: Secondary | ICD-10-CM | POA: Diagnosis not present

## 2015-10-22 DIAGNOSIS — Z79899 Other long term (current) drug therapy: Secondary | ICD-10-CM | POA: Insufficient documentation

## 2015-10-22 DIAGNOSIS — Z992 Dependence on renal dialysis: Secondary | ICD-10-CM | POA: Diagnosis not present

## 2015-10-22 DIAGNOSIS — I5020 Unspecified systolic (congestive) heart failure: Secondary | ICD-10-CM | POA: Diagnosis present

## 2015-10-22 DIAGNOSIS — I120 Hypertensive chronic kidney disease with stage 5 chronic kidney disease or end stage renal disease: Secondary | ICD-10-CM | POA: Insufficient documentation

## 2015-10-22 DIAGNOSIS — D61818 Other pancytopenia: Secondary | ICD-10-CM | POA: Diagnosis present

## 2015-10-22 DIAGNOSIS — R0902 Hypoxemia: Secondary | ICD-10-CM | POA: Diagnosis present

## 2015-10-22 DIAGNOSIS — D638 Anemia in other chronic diseases classified elsewhere: Secondary | ICD-10-CM | POA: Diagnosis present

## 2015-10-22 DIAGNOSIS — D649 Anemia, unspecified: Secondary | ICD-10-CM | POA: Diagnosis not present

## 2015-10-22 DIAGNOSIS — N186 End stage renal disease: Secondary | ICD-10-CM | POA: Diagnosis not present

## 2015-10-22 DIAGNOSIS — C9000 Multiple myeloma not having achieved remission: Secondary | ICD-10-CM | POA: Diagnosis not present

## 2015-10-22 DIAGNOSIS — D696 Thrombocytopenia, unspecified: Secondary | ICD-10-CM | POA: Diagnosis present

## 2015-10-22 LAB — CBC WITH DIFFERENTIAL/PLATELET
Basophils Absolute: 0 10*3/uL (ref 0.0–0.1)
Basophils Relative: 0 %
EOS ABS: 0 10*3/uL (ref 0.0–0.7)
EOS PCT: 1 %
HEMATOCRIT: 21.3 % — AB (ref 39.0–52.0)
HEMOGLOBIN: 6.9 g/dL — AB (ref 13.0–17.0)
LYMPHS ABS: 0.7 10*3/uL (ref 0.7–4.0)
LYMPHS PCT: 32 %
MCH: 36.3 pg — AB (ref 26.0–34.0)
MCHC: 32.4 g/dL (ref 30.0–36.0)
MCV: 112.1 fL — ABNORMAL HIGH (ref 78.0–100.0)
MONO ABS: 0.2 10*3/uL (ref 0.1–1.0)
MONOS PCT: 7 %
Neutro Abs: 1.4 10*3/uL — ABNORMAL LOW (ref 1.7–7.7)
Neutrophils Relative %: 60 %
Platelets: 9 10*3/uL — CL (ref 150–400)
RBC: 1.9 MIL/uL — ABNORMAL LOW (ref 4.22–5.81)
SMEAR REVIEW: DECREASED
WBC: 2.3 10*3/uL — ABNORMAL LOW (ref 4.0–10.5)

## 2015-10-22 LAB — COMPREHENSIVE METABOLIC PANEL
ALBUMIN: 3.7 g/dL (ref 3.5–5.0)
ALK PHOS: 44 U/L (ref 38–126)
ALT: 20 U/L (ref 17–63)
AST: 23 U/L (ref 15–41)
Anion gap: 10 (ref 5–15)
BUN: 53 mg/dL — AB (ref 6–20)
CALCIUM: 9.2 mg/dL (ref 8.9–10.3)
CHLORIDE: 106 mmol/L (ref 101–111)
CO2: 22 mmol/L (ref 22–32)
CREATININE: 7.07 mg/dL — AB (ref 0.61–1.24)
GFR calc Af Amer: 8 mL/min — ABNORMAL LOW (ref >60)
GFR calc non Af Amer: 7 mL/min — ABNORMAL LOW (ref >60)
GLUCOSE: 112 mg/dL — AB (ref 65–99)
Potassium: 5.1 mmol/L (ref 3.5–5.1)
SODIUM: 138 mmol/L (ref 135–145)
Total Bilirubin: 0.8 mg/dL (ref 0.3–1.2)
Total Protein: 7 g/dL (ref 6.5–8.1)

## 2015-10-22 LAB — CBC
HEMATOCRIT: 27.7 % — AB (ref 39.0–52.0)
HEMOGLOBIN: 9.2 g/dL — AB (ref 13.0–17.0)
MCH: 36.4 pg — ABNORMAL HIGH (ref 26.0–34.0)
MCHC: 33.2 g/dL (ref 30.0–36.0)
MCV: 109.5 fL — ABNORMAL HIGH (ref 78.0–100.0)
Platelets: 24 10*3/uL — CL (ref 150–400)
RBC: 2.53 MIL/uL — ABNORMAL LOW (ref 4.22–5.81)
RDW: 24.4 % — ABNORMAL HIGH (ref 11.5–15.5)
WBC: 2.8 10*3/uL — ABNORMAL LOW (ref 4.0–10.5)

## 2015-10-22 LAB — BRAIN NATRIURETIC PEPTIDE: B NATRIURETIC PEPTIDE 5: 2775 pg/mL — AB (ref 0.0–100.0)

## 2015-10-22 LAB — PREPARE RBC (CROSSMATCH)

## 2015-10-22 LAB — LIPASE, BLOOD: Lipase: 51 U/L (ref 11–51)

## 2015-10-22 MED ORDER — SODIUM CHLORIDE 0.9 % IV SOLN: 10.0000 mL/h | Freq: Once | INTRAVENOUS | Status: DC

## 2015-10-22 MED ORDER — PENTAFLUOROPROP-TETRAFLUOROETH EX AERO
INHALATION_SPRAY | CUTANEOUS | Status: AC
  Filled 2015-10-22: qty 103.5

## 2015-10-22 MED ORDER — PREGABALIN 75 MG PO CAPS
75.0000 mg | ORAL_CAPSULE | Freq: Two times a day (BID) | ORAL | Status: DC
  Administered 2015-10-22 – 2015-10-23 (×2): 75 mg via ORAL
  Filled 2015-10-22 (×3): qty 1

## 2015-10-22 MED ORDER — ACETAMINOPHEN 325 MG PO TABS
650.0000 mg | ORAL_TABLET | Freq: Four times a day (QID) | ORAL | Status: DC | PRN
  Administered 2015-10-22 (×2): 650 mg via ORAL
  Filled 2015-10-22 (×2): qty 2

## 2015-10-22 MED ORDER — ONDANSETRON HCL 4 MG/2ML IJ SOLN: 4.0000 mg | Freq: Four times a day (QID) | INTRAMUSCULAR | Status: DC | PRN

## 2015-10-22 MED ORDER — CALCIUM ACETATE (PHOS BINDER) 667 MG PO CAPS
2001.0000 mg | ORAL_CAPSULE | Freq: Once | ORAL | Status: AC
  Administered 2015-10-22: 2001 mg via ORAL

## 2015-10-22 MED ORDER — CARVEDILOL 3.125 MG PO TABS
3.1250 mg | ORAL_TABLET | Freq: Two times a day (BID) | ORAL | Status: DC
  Administered 2015-10-22 – 2015-10-23 (×2): 3.125 mg via ORAL
  Filled 2015-10-22 (×3): qty 1

## 2015-10-22 MED ORDER — ACETAMINOPHEN 650 MG RE SUPP: 650.0000 mg | Freq: Four times a day (QID) | RECTAL | Status: DC | PRN

## 2015-10-22 MED ORDER — ONDANSETRON HCL 4 MG PO TABS: 4.0000 mg | ORAL_TABLET | Freq: Four times a day (QID) | ORAL | Status: DC | PRN

## 2015-10-22 MED ORDER — SODIUM CHLORIDE 0.9 % IV SOLN
10.0000 mL/h | Freq: Once | INTRAVENOUS | Status: AC
  Administered 2015-10-22: 10 mL/h via INTRAVENOUS

## 2015-10-22 MED ORDER — PHENOL 1.4 % MT LIQD
1.0000 | OROMUCOSAL | Status: DC | PRN
  Administered 2015-10-22: 1 via OROMUCOSAL
  Filled 2015-10-22: qty 177

## 2015-10-22 MED ORDER — HYDROCODONE-ACETAMINOPHEN 5-325 MG PO TABS
1.0000 | ORAL_TABLET | Freq: Four times a day (QID) | ORAL | Status: DC | PRN
  Administered 2015-10-22: 1 via ORAL
  Filled 2015-10-22: qty 1

## 2015-10-22 MED ORDER — CALCIUM ACETATE (PHOS BINDER) 667 MG PO CAPS
2001.0000 mg | ORAL_CAPSULE | Freq: Three times a day (TID) | ORAL | Status: DC
  Filled 2015-10-22: qty 3

## 2015-10-22 MED ORDER — BENZONATATE 100 MG PO CAPS
200.0000 mg | ORAL_CAPSULE | Freq: Once | ORAL | Status: AC
  Administered 2015-10-22: 200 mg via ORAL
  Filled 2015-10-22: qty 2

## 2015-10-22 MED ORDER — SEVELAMER CARBONATE 800 MG PO TABS
1600.0000 mg | ORAL_TABLET | Freq: Three times a day (TID) | ORAL | Status: DC
  Administered 2015-10-22 – 2015-10-23 (×2): 1600 mg via ORAL
  Filled 2015-10-22 (×4): qty 2

## 2015-10-22 MED ORDER — PHENOL 1.4 % MT LIQD
1.0000 | OROMUCOSAL | Status: DC | PRN
  Filled 2015-10-22: qty 177

## 2015-10-22 MED ORDER — AMLODIPINE BESYLATE 5 MG PO TABS
5.0000 mg | ORAL_TABLET | Freq: Every day | ORAL | Status: DC
  Administered 2015-10-23: 5 mg via ORAL
  Filled 2015-10-22: qty 1

## 2015-10-22 MED ORDER — IPRATROPIUM-ALBUTEROL 0.5-2.5 (3) MG/3ML IN SOLN
3.0000 mL | Freq: Once | RESPIRATORY_TRACT | Status: AC
  Administered 2015-10-22: 3 mL via RESPIRATORY_TRACT
  Filled 2015-10-22: qty 3

## 2015-10-22 MED ORDER — ONDANSETRON HCL 4 MG/2ML IJ SOLN
4.0000 mg | Freq: Once | INTRAMUSCULAR | Status: AC
  Administered 2015-10-22: 4 mg via INTRAVENOUS
  Filled 2015-10-22: qty 2

## 2015-10-22 MED ORDER — CINACALCET HCL 30 MG PO TABS
30.0000 mg | ORAL_TABLET | Freq: Every day | ORAL | Status: DC
  Administered 2015-10-22: 30 mg via ORAL
  Filled 2015-10-22 (×3): qty 1

## 2015-10-22 NOTE — ED Provider Notes (Addendum)
CSN: [id number]     Arrival date & time 11/10/2015  0001 History  By signing my name below, I, [facility], attest that this documentation has been prepared under the direction and in the presence of Merryl Hacker, MD. Electronically Signed: Virgel Bouquet, ED Scribe. 11/06/2015. 12:42 AM.   Chief Complaint  Patient presents with  . Emesis    The history is provided by the patient. No language interpreter was used.   HPI Comments: [name] is a 70 y.o. male with an hx of HTN, multiple myeloma, thrombocytopenia, and stage 5 renal disease who presents to the Emergency Department complaining of intermittent, moderate emesis onset 3 days after a dialysis treatment. Pt states that the his was dialyzed 3 days ago and began to vomit later that day followed by chills, intermittent facial flushing, a productive cough, and generalized fatigue. He reports "I just don't feel good." He has not taken any medications or attempted any treatments. Denies hx of COPD and CHF. Denies cigarette smoking or home O2 use. Denies constipation, diarrhea, fever, SOB, leg swelling different from baseline, or any other symptoms currently.  Of note, patient had a recent admission for volume overload and required dialysis as an inpatient. History of chronic pancytopenia. Recent blood transfusion on June 2 for a hemoglobin of 8. No posttransfusion lab work.  Past Medical History  Diagnosis Date  . Hypertension   . Ruptured cervical disc   . Fall resulting in striking against other object     paralyzed  . Multiple myeloma   . Recurrent multiple myeloma of bone marrow with unknown EBV status ([place])   . Multiple myeloma ([place]) 01/26/2011  . Unspecified vitamin D deficiency 03/06/2013  . Anemia of chronic disease 12/30/2013  . Constipation   . Dialysis patient ([place]) 2016  . Thrombocytopenia ([place]) 08/31/2014  . Chronic renal disease, stage 5, glomerular filtration rate less than or equal to 15 mL/min/1.73  square meter (HCC) 12/30/2013    T/TH/Sat dialysis  . Constipation   . Hypoxia 09/2015   Past Surgical History  Procedure Laterality Date  . Inguinal hernia repair Bilateral   . Anterior cervical decomp/discectomy fusion    . Bone marrow aspirate and biopsy wiith lumbar puncture    . Melanoma excision      rt. breast  . Cervical spine surgery    . Kyphoplasty Bilateral 01/30/2014    Procedure: Thoracic eleven Kyphoplasty;  Surgeon: Consuella Lose, MD;  [facility];  Service: Neurosurgery;  Laterality: Bilateral;  Thoracic eleven Kyphoplasty  . Av fistula placement Right 04/30/2014    Procedure: BRACHIOCEPHALIC ARTERIOVENOUS (AV) FISTULA CREATION;  Surgeon: Conrad Easton, MD;  Location: [place];  Service: Vascular;  Laterality: Right;  . Portacath placement Left 02/18/2015    Procedure: INSERTION OF PORT A CATH RIGHT INTERNAL JUGULAR VEIN ;  Surgeon: Conrad Tolley, MD;  Location: [place];  Service: Vascular;  Laterality: Left;  . Cataract extraction w/phaco Right 04/15/2015    Procedure: CATARACT EXTRACTION PHACO AND INTRAOCULAR LENS PLACEMENT (IOC);  Surgeon: Tonny Branch, MD;  [facility]: [facility];  Service: Ophthalmology;  Laterality: Right;  CDE:8.97  . Cataract extraction w/phaco Left 04/29/2015    Procedure: CATARACT EXTRACTION PHACO AND INTRAOCULAR LENS PLACEMENT ; CDE:  7.61;  Surgeon: Tonny Branch, MD;  Location: [facility];  Service: Ophthalmology;  Laterality: Left;  . Electrophysiologic study N/A 10/03/2015    Procedure: A-Flutter Ablation;  Surgeon: Evans Lance, MD;  Location: [place] [facility];  Service: Cardiovascular;  Laterality: N/A;   Family History  Problem Relation Age of Onset  . Cancer Sister   . Cancer Brother    Social History  Substance Use Topics  . Smoking status: Never Smoker   . Smokeless tobacco: Never Used  . Alcohol Use: No    Review of Systems  Constitutional: Positive for chills and fatigue. Negative for fever.  HENT: Positive for congestion.    Respiratory: Positive for cough. Negative for shortness of breath.   Cardiovascular: Negative for leg swelling (baseline).  Gastrointestinal: Positive for vomiting. Negative for diarrhea and constipation.  All other systems reviewed and are negative.     Allergies  Pamidronate  Home Medications   Prior to Admission medications   Medication Sig Start Date End Date Taking? Authorizing Provider  amLODipine (NORVASC) 5 MG tablet Take 5 mg by mouth daily.    Historical Provider, MD  carvedilol (COREG) 3.125 MG tablet Take 1 tablet (3.125 mg total) by mouth 2 (two) times daily with a meal. 10/05/15   Bonnielee Haff, MD  cinacalcet (SENSIPAR) 30 MG tablet Take 30 mg by mouth at bedtime.    Historical Provider, MD  docusate sodium (COLACE) 100 MG capsule Take 100 mg by mouth 2 (two) times daily.    Historical Provider, MD  HYDROcodone-acetaminophen (NORCO/VICODIN) 5-325 MG tablet Take 1 tablet by mouth every 6 (six) hours as needed for severe pain. 09/23/15   Baird Cancer, PA-C  lisinopril (PRINIVIL,ZESTRIL) 20 MG tablet Take 20 mg by mouth every morning.  06/27/15   Historical Provider, MD  LYRICA 75 MG capsule TAKE 1 CAPSULE BY MOUTH TWICE DAILY. 10/07/15   Baird Cancer, PA-C  ondansetron (ZOFRAN) 8 MG tablet Take 1 tablet (8 mg total) by mouth every 8 (eight) hours as needed for nausea or vomiting. 03/15/15   Baird Cancer, PA-C  polyethylene glycol (MIRALAX / GLYCOLAX) packet Take 17 g by mouth 2 (two) times daily. 10/05/15   Bonnielee Haff, MD  torsemide (DEMADEX) 20 MG tablet Take 40 mg by mouth daily.  10/07/15   Historical Provider, MD   BP 144/79 mmHg  Pulse 75  Temp(Src) 97.8 F (36.6 C) (Oral)  Resp 23  Ht 5' 11"  (1.803 m)  Wt 231 lb (104.781 kg)  BMI 32.23 kg/m2  SpO2 93% Physical Exam  Constitutional: He is oriented to person, place, and time. No distress.  Chronically ill-appearing, no acute distress  HENT:  Head: Normocephalic and atraumatic.  Cardiovascular:  Normal rate, regular rhythm and normal heart sounds.   No murmur heard. Fistula right upper extremity with positive thrill  Pulmonary/Chest: Effort normal. No respiratory distress. He has wheezes. He has rales.  Abdominal: Soft. Bowel sounds are normal. There is no tenderness. There is no rebound.  Musculoskeletal: He exhibits edema.  2+ bilateral lower extremity edema  Neurological: He is alert and oriented to person, place, and time.  Skin: Skin is warm and dry.  Venous stasis changes bilateral lower extremities  Psychiatric: He has a normal mood and affect.  Nursing note and vitals reviewed.   ED Course  Procedures (including critical care time)  CRITICAL CARE Performed by: Merryl Hacker   Total critical care time: 25 minutes  Critical care time was exclusive of separately billable procedures and treating other patients.  Critical care was necessary to treat or prevent imminent or life-threatening deterioration.  Critical care was time spent personally by me on the following activities: development of treatment plan  with patient and/or surrogate as well as nursing, discussions with consultants, evaluation of patient's response to treatment, examination of patient, obtaining history from patient or surrogate, ordering and performing treatments and interventions, ordering and review of laboratory studies, ordering and review of radiographic studies, pulse oximetry and re-evaluation of patient's condition.   DIAGNOSTIC STUDIES: Oxygen Saturation is 90% on 2 L/min, low by my interpretation.    COORDINATION OF CARE: 12:33 AM Will order Zofran, abdomen/chest x-ray, and labs. Discussed treatment plan with pt at bedside and pt agreed to plan.   Labs Review Labs Reviewed  COMPREHENSIVE METABOLIC PANEL - Abnormal; Notable for the following:    Glucose, Bld 112 (*)    BUN 53 (*)    Creatinine, Ser 7.07 (*)    GFR calc non Af Amer 7 (*)    GFR calc Af Amer 8 (*)    All other  components within normal limits  BRAIN NATRIURETIC PEPTIDE - Abnormal; Notable for the following:    B Natriuretic Peptide 2775.0 (*)    All other components within normal limits  CBC WITH DIFFERENTIAL/PLATELET - Abnormal; Notable for the following:    WBC 2.3 (*)    RBC 1.90 (*)    Hemoglobin 6.9 (*)    HCT 21.3 (*)    MCV 112.1 (*)    MCH 36.3 (*)    Platelets 9 (*)    Neutro Abs 1.4 (*)    All other components within normal limits  LIPASE, BLOOD  CBC WITH DIFFERENTIAL/PLATELET  PREPARE RBC (CROSSMATCH)  TYPE AND SCREEN  PREPARE PLATELET PHERESIS    Imaging Review Dg Abd Acute W/chest  10/31/2015  CLINICAL DATA:  Vomiting EXAM: DG ABDOMEN ACUTE W/ 1V CHEST COMPARISON:  10/01/2015 chest x-ray FINDINGS: Chronic cardiomegaly. Negative aortic and hilar contours. Porta catheter on the right with tip at the SVC. Vascular stent on the right. Chronic sclerotic appearance of posterior right fifth, sixth, seventh ribs - presumably related to patient's history of myeloma. No edema or pneumonia. No evidence of pneumoperitoneum or pneumatosis. Nonobstructive bowel gas pattern. No concerning intra-abdominal mass effect or calcification. IMPRESSION: 1. Nonobstructive bowel gas pattern. 2. Chronic small right pleural effusion. Electronically Signed   By: Monte Fantasia M.D.   On: 10/28/2015 01:22   I have personally reviewed and evaluated these images and lab results as part of my medical decision-making.   EKG Interpretation   Date/Time:  Tuesday October 22 2015 00:45:59 EDT Ventricular Rate:  77 PR Interval:  168 QRS Duration: 127 QT Interval:  433 QTC Calculation: 490 R Axis:   -5 Text Interpretation:  Sinus rhythm Nonspecific intraventricular conduction  delay Borderline repol abnormality, diffuse leads No longer in flutter  Confirmed by HORTON  MD, COURTNEY ([id number]) on 11/05/2015 12:53:31 AM      MDM   Final diagnoses:  Hypoxia  Anemia, unspecified anemia type  Thrombocytopenia (HCC)   ESRD (end stage renal disease) on dialysis [facility])    Patient presents with generalized fatigue, vomiting, cough and chills. Chronically ill. Status post bone marrow transplant for multiple myeloma and chronic pancytopenia. He is nontoxic on exam. He is requiring 2 L of oxygen to require O2 sats greater than 90%. No history of COPD or smoking. He is wheezing on exam with rails. He does appear somewhat volume overloaded. Wheezing likely secondary to volume overload. Lab work and chest x-ray obtained. Chest x-ray shows chronic right pleural effusion but no other significant changes. BNP is elevated. Troponin and EKG are reassuring.  No longer in aflutter. Lab work with a hemoglobin of 6.9 and platelet count of 9. He is below transfusion thresholds. Lipase and abdominal lab work is reassuring. Acute abdominal series is negative. Given that he appears volume overloaded with hypoxia and likely needs dialysis, transfusion will just make this worse. Will admit for transfusion in conjunction with dialysis. Discussed with Dr. Darrick Meigs. Both packed RBC cells and platelets have been ordered. Discussed with Dr. Othelia Pulling, nephrology who will evaluate the patient for dialysis.  I personally performed the services described in this documentation, which was scribed in my presence. The recorded information has been reviewed and is accurate.    Merryl Hacker, MD 11/10/2015 [id number]  Merryl Hacker, MD 11/05/2015 [phone]

## 2015-10-22 NOTE — ED Notes (Signed)
Pt states he has felt bad since being dialyzed Saturday. He c/o cough and vomiting, he states he is weak.

## 2015-10-22 NOTE — ED Notes (Signed)
CRITICAL VALUE ALERT  Critical value received:  HGB-6.9 Platelets-9  Date of notification:  10/28/2015  Time of notification:  [id number] hrs  Critical value read back:Yes.    Nurse who received alert:  Y. Mehr Depaoli, RN  MD notified (1st page):  Dr. Dina Rich  Time of first page:  0248 hrs  Responding MD:  Dr. Dina Rich  Time MD responded:  0249 hrs

## 2015-10-22 NOTE — Consult Note (Signed)
Reason for Consult: End-stage renal disease Referring Physician: Dr. Hoy Register is an 70 y.o. male.  HPI: He is a patient who has history of hypertension, multiple myeloma, end-stage renal disease on maintenance hemodialysis presently came with complaints of weakness, difficulty breathing for the last 2 days. Patient also states that he has some cough but no sputum production. He is accompanied with nausea, vomiting but no diarrhea. Presently patient is getting blood transfusion and claims to feel better. Denies any fever, chills or sweating.  Past Medical History  Diagnosis Date  . Hypertension   . Ruptured cervical disc   . Fall resulting in striking against other object     paralyzed  . Multiple myeloma   . Recurrent multiple myeloma of bone marrow with unknown EBV status ([place])   . Multiple myeloma ([place]) 01/26/2011  . Unspecified vitamin D deficiency 03/06/2013  . Anemia of chronic disease 12/30/2013  . Constipation   . Dialysis patient ([place]) 2016  . Thrombocytopenia ([place]) 08/31/2014  . 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 Constipation   . Hypoxia 09/2015    Past Surgical History  Procedure Laterality Date  . Inguinal hernia repair Bilateral   . Anterior cervical decomp/discectomy fusion    . Bone marrow aspirate and biopsy wiith lumbar puncture    . Melanoma excision      rt. breast  . Cervical spine surgery    . Kyphoplasty Bilateral 01/30/2014    Procedure: Thoracic eleven Kyphoplasty;  Surgeon: Consuella Lose, MD;  [facility]: [facility];  Service: Neurosurgery;  Laterality: Bilateral;  Thoracic eleven Kyphoplasty  . Av fistula placement Right 04/30/2014    Procedure: BRACHIOCEPHALIC ARTERIOVENOUS (AV) FISTULA CREATION;  Surgeon: Conrad Cutler, MD;  Location: [place];  Service: Vascular;  Laterality: Right;  . Portacath placement Left 02/18/2015     Procedure: INSERTION OF PORT A CATH RIGHT INTERNAL JUGULAR VEIN ;  Surgeon: Conrad , MD;  Location: [place];  Service: Vascular;  Laterality: Left;  . Cataract extraction w/phaco Right 04/15/2015    Procedure: CATARACT EXTRACTION PHACO AND INTRAOCULAR LENS PLACEMENT (IOC);  Surgeon: Tonny Branch, MD;  Location: [facility];  Service: Ophthalmology;  Laterality: Right;  CDE:8.97  . Cataract extraction w/phaco Left 04/29/2015    Procedure: CATARACT EXTRACTION PHACO AND INTRAOCULAR LENS PLACEMENT ; CDE:  7.61;  Surgeon: Tonny Branch, MD;  Location: [facility];  Service: Ophthalmology;  Laterality: Left;  . Electrophysiologic study N/A 10/03/2015    Procedure: A-Flutter Ablation;  Surgeon: Evans Lance, MD;  Location: [place] [facility];  Service: Cardiovascular;  Laterality: N/A;    Family History  Problem Relation Age of Onset  . Cancer Sister   . Cancer Brother     Social History:  reports that he has never smoked. He has never used smokeless tobacco. He reports that he does not drink alcohol or use illicit drugs.  Allergies:  Allergies  Allergen Reactions  . Pamidronate Anaphylaxis    blood pressure     Medications: I have reviewed the patient's current medications.  Results for orders placed or performed during the hospital encounter of 10/27/2015 (from the past 48 hour(s))  Comprehensive metabolic panel     Status: Abnormal   Collection Time: 11/13/2015 12:34 AM  Result Value Ref Range   Sodium 138 135 - 145 mmol/L   Potassium 5.1 3.5 -  5.1 mmol/L   Chloride 106 101 - 111 mmol/L   CO2 22 22 - 32 mmol/L   Glucose, Bld 112 (H) 65 - 99 mg/dL   BUN 53 (H) 6 - 20 mg/dL   Creatinine, Ser 7.07 (H) 0.61 - 1.24 mg/dL   Calcium 9.2 8.9 - 10.3 mg/dL   Total Protein 7.0 6.5 - 8.1 g/dL   Albumin 3.7 3.5 - 5.0 g/dL   AST 23 15 - 41 U/L   ALT 20 17 - 63 U/L   Alkaline Phosphatase 44 38 - 126 U/L   Total Bilirubin 0.8 0.3 - 1.2 mg/dL   GFR calc non Af Amer 7 (L) >60 mL/min   GFR calc Af Amer 8 (L)  >60 mL/min    Comment: (NOTE) The eGFR has been calculated using the CKD EPI equation. This calculation has not been validated in all clinical situations. eGFR's persistently <60 mL/min signify possible Chronic Kidney Disease.    Anion gap 10 5 - 15  Lipase, blood     Status: None   Collection Time: 11/06/2015 12:34 AM  Result Value Ref Range   Lipase 51 11 - 51 U/L  Brain natriuretic peptide     Status: Abnormal   Collection Time: 11/06/2015 12:34 AM  Result Value Ref Range   B Natriuretic Peptide 2775.0 (H) 0.0 - 100.0 pg/mL  CBC with Differential/Platelet     Status: Abnormal   Collection Time: 11/09/2015  2:15 AM  Result Value Ref Range   WBC 2.3 (L) 4.0 - 10.5 K/uL   RBC 1.90 (L) 4.22 - 5.81 MIL/uL   Hemoglobin 6.9 (LL) 13.0 - 17.0 g/dL    Comment: REPEATED TO VERIFY CRITICAL RESULT CALLED TO, READ BACK BY AND VERIFIED WITH: WALL Y AT 0246 ON [id number] BY FORSYTH K    HCT 21.3 (L) 39.0 - 52.0 %   MCV 112.1 (H) 78.0 - 100.0 fL   MCH 36.3 (H) 26.0 - 34.0 pg   MCHC 32.4 30.0 - 36.0 g/dL   Platelets 9 (LL) 150 - 400 K/uL    Comment: SPECIMEN CHECKED FOR CLOTS REPEATED TO VERIFY CRITICAL RESULT CALLED TO, READ BACK BY AND VERIFIED WITH: WALL Y AT 0246 ON [id number] BY FORSYTH K    Neutrophils Relative % 60 %   Neutro Abs 1.4 (L) 1.7 - 7.7 K/uL   Lymphocytes Relative 32 %   Lymphs Abs 0.7 0.7 - 4.0 K/uL   Monocytes Relative 7 %   Monocytes Absolute 0.2 0.1 - 1.0 K/uL   Eosinophils Relative 1 %   Eosinophils Absolute 0.0 0.0 - 0.7 K/uL   Basophils Relative 0 %   Basophils Absolute 0.0 0.0 - 0.1 K/uL   WBC Morphology WHITE COUNT CONFIRMED ON SMEAR    RBC Morphology ELLIPTOCYTES     Comment: SPHEROCYTES TEARDROP CELLS POLYCHROMASIA PRESENT ROULEAUX BASOPHILIC STIPPLING    Smear Review PLATELETS APPEAR DECREASED     Comment: PLATELET COUNT PERFORMED ON CITRATED BLOOD  Type and screen [facility]     Status: None (Preliminary result)   Collection Time: 11/06/2015  3:20 AM   Result Value Ref Range   ABO/RH(D) A POS    Antibody Screen NEG    Sample Expiration 10/25/2015    Unit Number [id number]    Blood Component Type RED CELLS,LR    Unit division 00    Status of Unit ISSUED    Transfusion Status OK TO TRANSFUSE    Crossmatch Result Compatible   Prepare Pheresed  Platelets     Status: None (Preliminary result)   Collection Time: 11/04/2015  3:20 AM  Result Value Ref Range   Unit Number [id number]    Blood Component Type PLTP LR3 PAS    Unit division 00    Status of Unit ALLOCATED    Transfusion Status OK TO TRANSFUSE   Prepare RBC     Status: None   Collection Time: 11/06/2015  4:00 AM  Result Value Ref Range   Order Confirmation ORDER PROCESSED BY BLOOD BANK     Dg Abd Acute W/chest  11/02/2015  CLINICAL DATA:  Vomiting EXAM: DG ABDOMEN ACUTE W/ 1V CHEST COMPARISON:  10/01/2015 chest x-ray FINDINGS: Chronic cardiomegaly. Negative aortic and hilar contours. Porta catheter on the right with tip at the SVC. Vascular stent on the right. Chronic sclerotic appearance of posterior right fifth, sixth, seventh ribs - presumably related to patient's history of myeloma. No edema or pneumonia. No evidence of pneumoperitoneum or pneumatosis. Nonobstructive bowel gas pattern. No concerning intra-abdominal mass effect or calcification. IMPRESSION: 1. Nonobstructive bowel gas pattern. 2. Chronic small right pleural effusion. Electronically Signed   By: Monte Fantasia M.D.   On: 11/12/2015 01:22    Review of Systems  Constitutional: Positive for malaise/fatigue. Negative for fever and chills.  Respiratory: Positive for cough and shortness of breath. Negative for hemoptysis and sputum production.   Cardiovascular: Negative for chest pain, palpitations and orthopnea.  Gastrointestinal: Positive for nausea and vomiting. Negative for abdominal pain and diarrhea.  Neurological: Positive for weakness.   Blood pressure 125/69, pulse 66, temperature 98.7 F (37.1 C),  temperature source Oral, resp. rate 18, height _0  (1.803 m), weight 103 kg (227 lb 1.2 oz), SpO2 94 %. Physical Exam  Constitutional: He is oriented to person, place, and time. No distress.  Eyes: No scleral icterus.  Neck: No JVD present.  Cardiovascular: Intact distal pulses.   No murmur heard. Respiratory: No respiratory distress. He has wheezes. He has no rales.  GI: He exhibits distension. There is no tenderness. There is no rebound.  Musculoskeletal: He exhibits edema.  Neurological: He is alert and oriented to person, place, and time.    Assessment/Plan: Problem #1 anemia: This is a recurrent problem. Most likely secondary to multiple myeloma and poorly responsive to erythropoietin. Patient has been requiring multiple blood transfusion. Presently his hemoglobin is low and receiving 1 unit of blood transfusion. Problem #2 end-stage renal disease: He is status post hemodialysis on Saturday. He has some nausea and vomiting. His appetite is good. No diarrhea. Problem #3 hypertension: His blood pressure is reasonably controlled Problem #4 history of multiple myeloma: He status post bone marrow transplant. Presently he is off chemotherapy. He has pancytopenia with significantly low platelets. Problem #5. Metabolic bone disease: His calcium is range. His phosphorus has this moment is not available and patient is on a binder as an outpatient.  problem #6 difficulty breathing. Possibly secondary to severe anemia and fluid overload. Seems to be improving with blood transfusion. Problem #7 history of atrial flutter Plan: We'll make arrangements for patient to get dialysis today 2] we'll try to remove 3 L as blood pressure tolerates 3] we'll transfuse the remaining unit on dialysis. 4] we'll start on Renvela 800 mg 2 tablets by mouth 3 times a day with meals  5] we'll check his renal panel and CBC in the morning.   Thania Woodlief S 10/21/2015, 8:48 AM

## 2015-10-22 NOTE — H&P (Addendum)
TRH H&P   Patient Demographics:    [name], is a 70 y.o. male  MRN: [id number]   DOB - 1945/11/29  Admit Date - 11/07/2015  Outpatient Primary MD for the patient is No PCP Per Patient  Referring MD/NP/PA: Dr Dina Rich  Patient coming from: home   Chief Complaint  Patient presents with  . Emesis      HPI:    [name]  is a 70 y.o. male, With history of hypertension, multiple myeloma, pancytopenia a end-stage renal disease on hemodialysis who came to the hospital with chief complaint of vomiting and coughing for pulse 3 days. Patient was dialyzed on Saturday and says that coughing has become worse today. He denies shortness of breath. No fever but complains of chills. No history of COPD and CHF. Patient requiring 2 L of oxygen to keep oxygen sats greater than 90%. Patient was seen by oncology on 10/18/15 and was given 1 unit of blood transfusion In the ED patient found to be in pancytopenia with platelet count of 9, hemoglobin 6.9 He denies chest pain. No diarrhea.   Review of systems:    In addition to the HPI above,  No Fever-chills, No Headache, No changes with Vision or hearing, No problems swallowing food or Liquids, + , Cough  No Abdominal pain, No Nausea or Vommitting, Bowel movements are regular, No Blood in stool or Urine, No dysuria, No new skin rashes or bruises, No new joints pains-aches,  No new weakness, tingling, numbness in any extremity,   A full 10 point Review of Systems was done, except as stated above, all other Review of Systems were negative.   With Past History of the following :    Past Medical History  Diagnosis Date  . Hypertension   . Ruptured cervical disc   . Fall resulting in striking against other object     paralyzed  . Multiple myeloma   . Recurrent multiple myeloma of bone marrow with unknown EBV status ([place])   . Multiple myeloma ([place])  01/26/2011  . Unspecified vitamin D deficiency 03/06/2013  . Anemia of chronic disease 12/30/2013  . Constipation   . Dialysis patient ([place]) 2016  . Thrombocytopenia ([place]) 08/31/2014  . 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 Constipation   . Hypoxia 09/2015      Past Surgical History  Procedure Laterality Date  . Inguinal hernia repair Bilateral   . Anterior cervical decomp/discectomy fusion    . Bone marrow aspirate and biopsy wiith lumbar puncture    . Melanoma excision      rt. breast  . Cervical spine surgery    . Kyphoplasty Bilateral 01/30/2014    Procedure: Thoracic eleven Kyphoplasty;  Surgeon: Consuella Lose, MD;  [facility];  Service: Neurosurgery;  Laterality: Bilateral;  Thoracic eleven Kyphoplasty  . Av fistula placement Right 04/30/2014    Procedure: BRACHIOCEPHALIC ARTERIOVENOUS (AV)  FISTULA CREATION;  Surgeon: Conrad Mapleton, MD;  Location: [place];  Service: Vascular;  Laterality: Right;  . Portacath placement Left 02/18/2015    Procedure: INSERTION OF PORT A CATH RIGHT INTERNAL JUGULAR VEIN ;  Surgeon: Conrad [place], MD;  Location: [place];  Service: Vascular;  Laterality: Left;  . Cataract extraction w/phaco Right 04/15/2015    Procedure: CATARACT EXTRACTION PHACO AND INTRAOCULAR LENS PLACEMENT (IOC);  Surgeon: Tonny Branch, MD;  Location: [facility];  Service: Ophthalmology;  Laterality: Right;  CDE:8.97  . Cataract extraction w/phaco Left 04/29/2015    Procedure: CATARACT EXTRACTION PHACO AND INTRAOCULAR LENS PLACEMENT ; CDE:  7.61;  Surgeon: Tonny Branch, MD;  Location: [facility];  Service: Ophthalmology;  Laterality: Left;  . Electrophysiologic study N/A 10/03/2015    Procedure: A-Flutter Ablation;  Surgeon: Evans Lance, MD;  Location: [place] [facility];  Service: Cardiovascular;  Laterality: N/A;      Social History:     Social History  Substance Use  Topics  . Smoking status: Never Smoker   . Smokeless tobacco: Never Used  . Alcohol Use: No        Family History :     Family History  Problem Relation Age of Onset  . Cancer Sister   . Cancer Brother       Home Medications:   Prior to Admission medications   Medication Sig Start Date End Date Taking? Authorizing Provider  amLODipine (NORVASC) 5 MG tablet Take 5 mg by mouth daily.    Historical Provider, MD  carvedilol (COREG) 3.125 MG tablet Take 1 tablet (3.125 mg total) by mouth 2 (two) times daily with a meal. 10/05/15   Bonnielee Haff, MD  cinacalcet (SENSIPAR) 30 MG tablet Take 30 mg by mouth at bedtime.    Historical Provider, MD  docusate sodium (COLACE) 100 MG capsule Take 100 mg by mouth 2 (two) times daily.    Historical Provider, MD  HYDROcodone-acetaminophen (NORCO/VICODIN) 5-325 MG tablet Take 1 tablet by mouth every 6 (six) hours as needed for severe pain. 09/23/15   Baird Cancer, PA-C  lisinopril (PRINIVIL,ZESTRIL) 20 MG tablet Take 20 mg by mouth every morning.  06/27/15   Historical Provider, MD  LYRICA 75 MG capsule TAKE 1 CAPSULE BY MOUTH TWICE DAILY. 10/07/15   Baird Cancer, PA-C  ondansetron (ZOFRAN) 8 MG tablet Take 1 tablet (8 mg total) by mouth every 8 (eight) hours as needed for nausea or vomiting. 03/15/15   Baird Cancer, PA-C  polyethylene glycol (MIRALAX / GLYCOLAX) packet Take 17 g by mouth 2 (two) times daily. 10/05/15   Bonnielee Haff, MD  torsemide (DEMADEX) 20 MG tablet Take 40 mg by mouth daily.  10/07/15   Historical Provider, MD     Allergies:     Allergies  Allergen Reactions  . Pamidronate Anaphylaxis    blood pressure      Physical Exam:   Vitals  Blood pressure 139/72, pulse 76, temperature 97.8 F (36.6 C), temperature source Oral, resp. rate 16, height _0  (1.803 m), weight 104.781 kg (231 lb), SpO2 96 %.   1. General Caucasian male* lying in bed in NAD, cooperative*with exam  2. Normal affect and insight,  Awake  Alert, Oriented X 3.  3. No F.N deficits, ALL C.Nerves Intact, Strength 5/5 all 4 extremities, Sensation intact all 4 extremities, Plantars down going.  4. Ears and Eyes appear Normal, Conjunctivae clear, PERRLA. Moist Oral Mucosa.  5. Supple Neck, No  JVD, No cervical lymphadenopathy appriciated, No Carotid Bruits.  6. Symmetrical Chest wall movement, Good air movement bilaterally, bilateral wheezing  7. RRR, No Gallops, Rubs or Murmurs, No Parasternal Heave.  8. Positive Bowel Sounds, Abdomen Soft, No tenderness, No organomegaly appriciated,No rebound -guarding or rigidity.  9.  No Cyanosis, Normal Skin Turgor, No Skin Rash or Bruise.  10. Good muscle tone,  joints appear normal , no effusions, Normal ROM.      Data Review:    CBC  Recent Labs Lab 10/16/15 0957 10/25/2015 0215  WBC 3.1* 2.3*  HGB 8.0* 6.9*  HCT 24.0* 21.3*  PLT 10* 9*  MCV 112.7* 112.1*  MCH 37.6* 36.3*  MCHC 33.3 32.4  LYMPHSABS 1.0 0.7  MONOABS 0.3 0.2  EOSABS 0.1 0.0  BASOSABS 0.0 0.0   ------------------------------------------------------------------------------------------------------------------  Chemistries   Recent Labs Lab 11/03/2015 0034  NA 138  K 5.1  CL 106  CO2 22  GLUCOSE 112*  BUN 53*  CREATININE 7.07*  CALCIUM 9.2  AST 23  ALT 20  ALKPHOS 44  BILITOT 0.8   -----------------------------------------------------------------------------------------------------------------------------------  ------------------------------------------------------------------------------------------------------------------    Component Value Date/Time   BNP 2775.0* 11/05/2015 0034     ---------------------------------------------------------------------------------------------------------------  Urinalysis    Component Value Date/Time   COLORURINE YELLOW 01/10/2014 2045   APPEARANCEUR CLEAR 01/10/2014 2045   LABSPEC 1.020 01/10/2014 2045   PHURINE 5.5 01/10/2014 2045   GLUCOSEU  NEGATIVE 01/10/2014 2045   HGBUR LARGE* 01/10/2014 2045   BILIRUBINUR NEGATIVE 01/10/2014 2045   KETONESUR NEGATIVE 01/10/2014 2045   PROTEINUR 100* 01/10/2014 2045   UROBILINOGEN 0.2 01/10/2014 2045   NITRITE NEGATIVE 01/10/2014 2045   LEUKOCYTESUR NEGATIVE 01/10/2014 2045    ----------------------------------------------------------------------------------------------------------------   Imaging Results:    Dg Abd Acute W/chest  11/07/2015  CLINICAL DATA:  Vomiting EXAM: DG ABDOMEN ACUTE W/ 1V CHEST COMPARISON:  10/01/2015 chest x-ray FINDINGS: Chronic cardiomegaly. Negative aortic and hilar contours. Porta catheter on the right with tip at the SVC. Vascular stent on the right. Chronic sclerotic appearance of posterior right fifth, sixth, seventh ribs - presumably related to patient's history of myeloma. No edema or pneumonia. No evidence of pneumoperitoneum or pneumatosis. Nonobstructive bowel gas pattern. No concerning intra-abdominal mass effect or calcification. IMPRESSION: 1. Nonobstructive bowel gas pattern. 2. Chronic small right pleural effusion. Electronically Signed   By: Monte Fantasia M.D.   On: 10/21/2015 01:22    My personal review of EKG: Rhythm NSR, Rate 77* /min, QTc 490 , no Acute ST changes   Assessment & Plan:    Active Problems:   Multiple myeloma (HCC)   Anemia of chronic disease   Thrombocytopenia (HCC)   Anemia   Pancytopenia (HCC)   ESRD on hemodialysis (HCC)   Hypoxia   Systolic HF (heart failure) ([place])     1. Fluid overload- BNP elevated 2775, patient will need hemodialysis, nephrology was consulted by ED physician for hemodialysis in a.m. 2. Thrombocytopenia- platelet count is 9000, will transfuse 1 unit of platelets with hemodialysis. 3. Anemia of chronic disease- hemoglobin 6.9, was 8.0 week ago, will transfuse 1 unit PRBC with hemodialysis. 4. ESRD on hemodialysis- nephrology consulted for dialysis in a.m. 5. 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 Will  follow oncology at outpatient 6. History of multiple myeloma- status post autologous bone marrow transplant, currently off therapy.   DVT Prophylaxis-  SCDs  AM Labs Ordered, also please review Full Orders  Family Communication: No family present at bedside  Code  Status:  DO NOT RESUSCITATE  Admission status: Observation  Time spent in minutes : 55 min   Vick Filter S M.D on 10/23/2015 at 3:58 AM  Between 7am to 7pm - Pager - [phone]. After 7pm go to [email or web address] - password [email or web address]  [facility] - Office  [phone]

## 2015-10-22 NOTE — Progress Notes (Signed)
[facility] PROGRESS NOTE  [name] PFX:[id number] DOB: Oct 05, 1945    PCP:   No PCP Per Patient   HPI:  [name] is a 70 y.o. male, With history of hypertension, multiple myeloma, pancytopenia a end-stage renal disease on hemodialysis who came to the hospital with chief complaint of vomiting and coughing for past 3 days. Patient was dialyzed on Saturday and says that coughing has become worse on admission. He denies shortness of breath. No fever but complains of chills. No history of COPD and CHF. Patient requiring 2 L of oxygen to keep oxygen sats greater than 90%. Patient was seen by oncology on 10/18/15 and was given 1 unit of blood transfusion In the ED patient found to be in pancytopenia with platelet count of 9, hemoglobin 6.9.  He was given blood, and had dialysis today.  His platelet count today is 24K, and his Hb rose to 9.2 g per dL.   He denies chest pain. No diarrhea.   Rewiew of Systems:  Constitutional: Negative for malaise, fever and chills. No significant weight loss or weight gain Eyes: Negative for eye pain, redness and discharge, diplopia, visual changes, or flashes of light. ENMT: Negative for ear pain, hoarseness, nasal congestion, sinus pressure and sore throat. No headaches; tinnitus, drooling, or problem swallowing. Cardiovascular: Negative for chest pain, palpitations, diaphoresis, dyspnea and peripheral edema. ; No orthopnea, PND Respiratory: Negative for cough, hemoptysis, wheezing and stridor. No pleuritic chestpain. Gastrointestinal: Negative for nausea, vomiting, diarrhea, constipation, abdominal pain, melena, blood in stool, hematemesis, jaundice and rectal bleeding.    Genitourinary: Negative for frequency, dysuria, incontinence,flank pain and hematuria; Musculoskeletal: Negative for back pain and neck pain. Negative for swelling and trauma.;  Skin: . Negative for pruritus, rash, abrasions, bruising and skin lesion.; ulcerations Neuro: Negative  for headache, lightheadedness and neck stiffness. Negative for weakness, altered level of consciousness , altered mental status, extremity weakness, burning feet, involuntary movement, seizure and syncope.  Psych: negative for anxiety, depression, insomnia, tearfulness, panic attacks, hallucinations, paranoia, suicidal or homicidal ideation    Past Medical History  Diagnosis Date  . Hypertension   . Ruptured cervical disc   . Fall resulting in striking against other object     paralyzed  . Multiple myeloma   . Recurrent multiple myeloma of bone marrow with unknown EBV status ([place])   . Multiple myeloma ([place]) 01/26/2011  . Unspecified vitamin D deficiency 03/06/2013  . Anemia of chronic disease 12/30/2013  . Constipation   . Dialysis patient ([place]) 2016  . Thrombocytopenia ([place]) 08/31/2014  . 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 Constipation   . Hypoxia 09/2015    Past Surgical History  Procedure Laterality Date  . Inguinal hernia repair Bilateral   . Anterior cervical decomp/discectomy fusion    . Bone marrow aspirate and biopsy wiith lumbar puncture    . Melanoma excision      rt. breast  . Cervical spine surgery    . Kyphoplasty Bilateral 01/30/2014    Procedure: Thoracic eleven Kyphoplasty;  Surgeon: Consuella Lose, MD;  [facility]: [facility];  Service: Neurosurgery;  Laterality: Bilateral;  Thoracic eleven Kyphoplasty  . Av fistula placement Right 04/30/2014    Procedure: BRACHIOCEPHALIC ARTERIOVENOUS (AV) FISTULA CREATION;  Surgeon: Conrad Oxford, MD;  Location: [place];  Service: Vascular;  Laterality: Right;  . Portacath placement Left 02/18/2015  Procedure: INSERTION OF PORT A CATH RIGHT INTERNAL JUGULAR VEIN ;  Surgeon: Conrad [place], MD;  Location: [place];  Service: Vascular;  Laterality: Left;  . Cataract extraction w/phaco Right 04/15/2015    Procedure: CATARACT  EXTRACTION PHACO AND INTRAOCULAR LENS PLACEMENT (IOC);  Surgeon: Tonny Branch, MD;  Location: [facility];  Service: Ophthalmology;  Laterality: Right;  CDE:8.97  . Cataract extraction w/phaco Left 04/29/2015    Procedure: CATARACT EXTRACTION PHACO AND INTRAOCULAR LENS PLACEMENT ; CDE:  7.61;  Surgeon: Tonny Branch, MD;  Location: [facility];  Service: Ophthalmology;  Laterality: Left;  . Electrophysiologic study N/A 10/03/2015    Procedure: A-Flutter Ablation;  Surgeon: Evans Lance, MD;  Location: [place] [facility];  Service: Cardiovascular;  Laterality: N/A;    Medications:  HOME MEDS: Prior to Admission medications   Medication Sig Start Date End Date Taking? Authorizing Provider  amLODipine (NORVASC) 5 MG tablet Take 5 mg by mouth daily.   Yes Historical Provider, MD  carvedilol (COREG) 3.125 MG tablet Take 1 tablet (3.125 mg total) by mouth 2 (two) times daily with a meal. 10/05/15  Yes Bonnielee Haff, MD  cinacalcet (SENSIPAR) 30 MG tablet Take 30 mg by mouth at bedtime.   Yes Historical Provider, MD  docusate sodium (COLACE) 100 MG capsule Take 100 mg by mouth 2 (two) times daily.   Yes Historical Provider, MD  HYDROcodone-acetaminophen (NORCO/VICODIN) 5-325 MG tablet Take 1 tablet by mouth every 6 (six) hours as needed for severe pain. 09/23/15  Yes Manon Hilding Kefalas, PA-C  lisinopril (PRINIVIL,ZESTRIL) 20 MG tablet Take 20 mg by mouth every morning.  06/27/15  Yes Historical Provider, MD  LYRICA 75 MG capsule TAKE 1 CAPSULE BY MOUTH TWICE DAILY. 10/07/15  Yes Manon Hilding Kefalas, PA-C  ondansetron (ZOFRAN) 8 MG tablet Take 1 tablet (8 mg total) by mouth every 8 (eight) hours as needed for nausea or vomiting. 03/15/15  Yes Manon Hilding Kefalas, PA-C  polyethylene glycol (MIRALAX / GLYCOLAX) packet Take 17 g by mouth 2 (two) times daily. Patient taking differently: Take 17 g by mouth daily as needed for mild constipation.  10/05/15  Yes Bonnielee Haff, MD  torsemide (DEMADEX) 20 MG tablet Take 40 mg by mouth  daily.  10/07/15  Yes Historical Provider, MD     Allergies:  Allergies  Allergen Reactions  . Pamidronate Anaphylaxis    blood pressure     Social History:   reports that he has never smoked. He has never used smokeless tobacco. He reports that he does not drink alcohol or use illicit drugs.  Family History: Family History  Problem Relation Age of Onset  . Cancer Sister   . Cancer Brother      Physical Exam: Filed Vitals:   11/10/2015 1530 11/02/2015 1600 10/18/2015 1620 11/13/2015 1625  BP: 141/78 126/67 125/68 148/70  Pulse: 76 78 81 81  Temp:    99.2 F (37.3 C)  TempSrc:    Oral  Resp: _0 Height:      Weight:      SpO2:       Blood pressure 148/70, pulse 81, temperature 99.2 F (37.3 C), temperature source Oral, resp. rate 18, height _1  (1.803 m), weight 103 kg (227 lb 1.2 oz), SpO2 92 %.  GEN:  Pleasant  patient lying in the stretcher in no acute distress; cooperative with exam. PSYCH:  alert and oriented x4; does not appear anxious or depressed; affect is appropriate. HEENT:  Mucous membranes pink and anicteric; PERRLA; EOM intact; no cervical lymphadenopathy nor thyromegaly or carotid bruit; no JVD; There were no stridor. Neck is very supple. Breasts:: Not examined CHEST WALL: No tenderness CHEST: Normal respiration, clear to auscultation bilaterally.  HEART: Regular rate and rhythm.  There are no murmur, rub, or gallops.   BACK: No kyphosis or scoliosis; no CVA tenderness ABDOMEN: soft and non-tender; no masses, no organomegaly, normal abdominal bowel sounds; no pannus; no intertriginous candida. There is no rebound and no distention. Rectal Exam: Not done EXTREMITIES: No bone or joint deformity; age-appropriate arthropathy of the hands and knees; no edema; no ulcerations.  There is no calf tenderness. Genitalia: not examined PULSES: 2+ and symmetric SKIN: Normal hydration no rash or ulceration CNS: Cranial nerves 2-12 grossly intact no focal  lateralizing neurologic deficit.  Speech is fluent; uvula elevated with phonation, facial symmetry and tongue midline. DTR are normal bilaterally, cerebella exam is intact, barbinski is negative and strengths are equaled bilaterally.  No sensory loss.   Labs on Admission:  Basic Metabolic Panel:  Recent Labs Lab 10/27/2015 0034  NA 138  K 5.1  CL 106  CO2 22  GLUCOSE 112*  BUN 53*  CREATININE 7.07*  CALCIUM 9.2   Liver Function Tests:  Recent Labs Lab 10/26/2015 0034  AST 23  ALT 20  ALKPHOS 44  BILITOT 0.8  PROT 7.0  ALBUMIN 3.7    Recent Labs Lab 10/28/2015 0034  LIPASE 51   CBC:  Recent Labs Lab 10/16/15 0957 11/03/2015 0215 11/14/2015 1233  WBC 3.1* 2.3* 2.8*  NEUTROABS 1.8 1.4*  --   HGB 8.0* 6.9* 9.2*  HCT 24.0* 21.3* 27.7*  MCV 112.7* 112.1* 109.5*  PLT 10* 9* 24*    Radiological Exams on Admission: Dg Abd Acute W/chest  11/04/2015  CLINICAL DATA:  Vomiting EXAM: DG ABDOMEN ACUTE W/ 1V CHEST COMPARISON:  10/01/2015 chest x-ray FINDINGS: Chronic cardiomegaly. Negative aortic and hilar contours. Porta catheter on the right with tip at the SVC. Vascular stent on the right. Chronic sclerotic appearance of posterior right fifth, sixth, seventh ribs - presumably related to patient's history of myeloma. No edema or pneumonia. No evidence of pneumoperitoneum or pneumatosis. Nonobstructive bowel gas pattern. No concerning intra-abdominal mass effect or calcification. IMPRESSION: 1. Nonobstructive bowel gas pattern. 2. Chronic small right pleural effusion. Electronically Signed   By: Monte Fantasia M.D.   On: 11/11/2015 01:22    EKG: Independently reviewed.    Assessment/Plan Present on Admission:  . Hypoxia . Anemia . Thrombocytopenia ([place]) . Pancytopenia ([place]) . Multiple myeloma ([place]) . Anemia of chronic disease . Systolic HF (heart failure) (HCC)  PLAN:  Coughs:  Volume overload.  ESRD on HD.  Doing better now post dialysis. MDS:  Platelet count is better  today at 24K. Anemia:  S/p transfusion, with Hb now at 9g per dL, feeling better. Multiple myeloma:  S/p autologous BM transplant, currently off therapy.  Pancytopenia:  Has been followed closely by oncology outpatient.    Other plans as per orders. Code Status: DNR.    Orvan Falconer, MD.  FACP [facility] Pager [phone] 7pm to 7am.  10/18/2015, 5:28 PM

## 2015-10-23 ENCOUNTER — Other Ambulatory Visit (HOSPITAL_COMMUNITY): Payer: Medicare Other

## 2015-10-23 DIAGNOSIS — C9002 Multiple myeloma in relapse: Secondary | ICD-10-CM | POA: Diagnosis not present

## 2015-10-23 DIAGNOSIS — Z992 Dependence on renal dialysis: Secondary | ICD-10-CM

## 2015-10-23 DIAGNOSIS — N186 End stage renal disease: Secondary | ICD-10-CM | POA: Diagnosis not present

## 2015-10-23 DIAGNOSIS — D61818 Other pancytopenia: Secondary | ICD-10-CM | POA: Diagnosis not present

## 2015-10-23 DIAGNOSIS — D538 Other specified nutritional anemias: Secondary | ICD-10-CM

## 2015-10-23 DIAGNOSIS — D649 Anemia, unspecified: Secondary | ICD-10-CM | POA: Diagnosis not present

## 2015-10-23 LAB — RENAL FUNCTION PANEL
ANION GAP: 8 (ref 5–15)
Albumin: 3.4 g/dL — ABNORMAL LOW (ref 3.5–5.0)
BUN: 38 mg/dL — AB (ref 6–20)
CHLORIDE: 97 mmol/L — AB (ref 101–111)
CO2: 32 mmol/L (ref 22–32)
Calcium: 8.3 mg/dL — ABNORMAL LOW (ref 8.9–10.3)
Creatinine, Ser: 5.15 mg/dL — ABNORMAL HIGH (ref 0.61–1.24)
GFR calc Af Amer: 12 mL/min — ABNORMAL LOW (ref >60)
GFR calc non Af Amer: 10 mL/min — ABNORMAL LOW (ref >60)
GLUCOSE: 96 mg/dL (ref 65–99)
POTASSIUM: 4.3 mmol/L (ref 3.5–5.1)
Phosphorus: 4.6 mg/dL (ref 2.5–4.6)
Sodium: 137 mmol/L (ref 135–145)

## 2015-10-23 LAB — CBC
HEMATOCRIT: 26.6 % — AB (ref 39.0–52.0)
HEMOGLOBIN: 9 g/dL — AB (ref 13.0–17.0)
MCH: 37 pg — ABNORMAL HIGH (ref 26.0–34.0)
MCHC: 33.8 g/dL (ref 30.0–36.0)
MCV: 109.5 fL — AB (ref 78.0–100.0)
PLATELETS: 19 10*3/uL — AB (ref 150–400)
RBC: 2.43 MIL/uL — ABNORMAL LOW (ref 4.22–5.81)
WBC: 3.6 10*3/uL — ABNORMAL LOW (ref 4.0–10.5)

## 2015-10-23 LAB — TYPE AND SCREEN
ABO/RH(D): A POS
Antibody Screen: NEGATIVE
UNIT DIVISION: 0

## 2015-10-23 LAB — PREPARE PLATELET PHERESIS: Unit division: 0

## 2015-10-23 MED ORDER — IPRATROPIUM-ALBUTEROL 0.5-2.5 (3) MG/3ML IN SOLN
3.0000 mL | Freq: Four times a day (QID) | RESPIRATORY_TRACT | Status: DC
  Administered 2015-10-23 (×2): 3 mL via RESPIRATORY_TRACT
  Filled 2015-10-23 (×2): qty 3

## 2015-10-23 MED ORDER — SODIUM CHLORIDE 0.9 % IV SOLN: 100.0000 mL | INTRAVENOUS | Status: DC | PRN

## 2015-10-23 MED ORDER — LIDOCAINE-PRILOCAINE 2.5-2.5 % EX CREA: 1.0000 "application " | TOPICAL_CREAM | CUTANEOUS | Status: DC | PRN

## 2015-10-23 MED ORDER — LIDOCAINE HCL (PF) 1 % IJ SOLN: 5.0000 mL | INTRAMUSCULAR | Status: DC | PRN

## 2015-10-23 MED ORDER — PENTAFLUOROPROP-TETRAFLUOROETH EX AERO: 1.0000 "application " | INHALATION_SPRAY | CUTANEOUS | Status: DC | PRN

## 2015-10-23 NOTE — Progress Notes (Signed)
[name]  MRN: [id number]  DOB/AGE: 01/08/1946 70 y.o.  Primary Care Physician:No PCP Per Patient  Admit date: 11/04/2015  Chief Complaint:  Chief Complaint  Patient presents with  . Emesis    S-Pt presented on  11/05/2015 with  Chief Complaint  Patient presents with  . Emesis  .    Pt resting comfortably ,in no apparent distress.  Marland Kitchen amLODipine  5 mg Oral Daily  . carvedilol  3.125 mg Oral BID WC  . cinacalcet  30 mg Oral Q supper  . ipratropium-albuterol  3 mL Nebulization Q6H  . pregabalin  75 mg Oral BID  . sevelamer carbonate  1,600 mg Oral TID WC       Physical Exam: Vital signs in last 24 hours: Temp:  [98.7 F (37.1 C)-102.4 F (39.1 C)] 98.7 F (37.1 C) (06/06 2210) Pulse Rate:  [61-94] 67 (06/06 2141) Resp:  [18-20] 20 (06/06 2141) BP: (122-159)/(54-78) 122/62 mmHg (06/06 2141) SpO2:  [98 %] 98 % (06/06 2141) Weight change:  Last BM Date: 10/21/15  Intake/Output from previous day: 06/06 0701 - 06/07 0700 In: 1244 [P.O.:720; Blood:524] Out: 3200 [Urine:200]     Physical Exam: General- pt is awake,alert, oriented to time place and person Resp- No acute REsp distress, CTA B/L NO Rhonchi CVS- S1S2 regular in rate and rhythm GIT- BS+, soft, NT, ND EXT- NO LE Edema, Cyanosis Access Left AVF  Lab Results: CBC  Recent Labs  11/13/2015 1233 10/23/15 0613  WBC 2.8* 3.6*  HGB 9.2* 9.0*  HCT 27.7* 26.6*  PLT 24* 19*    BMET  Recent Labs  10/23/2015 0034 10/23/15 0613  NA 138 137  K 5.1 4.3  CL 106 97*  CO2 22 32  GLUCOSE 112* 96  BUN 53* 38*  CREATININE 7.07* 5.15*  CALCIUM 9.2 8.3*    MICRO No results found for this or any previous visit (from the past 240 hour(s)).    Lab Results  Component Value Date   CALCIUM 8.3* 10/23/2015   CAION 1.16 10/01/2015   PHOS 4.6 10/23/2015       Impression: 1)Renal  ESRD on HD                Pt is on Tues/Thurs/Saturday schedule                Pt is was dialyzed yesterday   2)HTN   BP at goal  Medication-  On Calcium Channel Blockers On Alpha and beta Blockers  3)Anemia IN ESRD the goal for HGb is 9--11   Anemia sec to Multiple myeloma and ESRD  Pt requiring PRBC   4)CKD Mineral-Bone Disorder Secondary Hyperparathyroidism present      On sensipar Phosphorus at goal.      On BInders Calcium is at goal.  5)Resp-admitted with cough Now better Primary MD following  6)Electrolytes Normokalemic NOrmonatremic    7)Acid base Co2 at goal   8) Oncology-Hx of Multiple myeloma                      Pancytopenia                      Being followed by oncology      Plan:  No need of HD today Will continue current care Will dialyze in am      BHUTANI,MANPREET S 10/23/2015, 1:48 PM

## 2015-10-23 NOTE — Progress Notes (Signed)
MD paged and made aware of patients critical PLT count of 19 this morning at 0813.

## 2015-10-23 NOTE — Progress Notes (Signed)
[facility] PROGRESS NOTE  [name] ENM:[id number] DOB: Jun 01, 1945    PCP:   No PCP Per Patient   HPI:  [name] is a 70 y.o. male, with history of hypertension, multiple myeloma, pancytopenia a end-stage renal disease on hemodialysis who came to the hospital with chief complaint of vomiting and coughing for past 3 days. Patient was dialyzed on Saturday and says that coughing has become worse on admission. He denies shortness of breath. No fever but complains of chills. No history of COPD and CHF. Patient requiring 2 L of oxygen to keep oxygen sats greater than 90%. Patient was seen by oncology on 10/18/15 and was given 1 unit of blood transfusion In the ED patient found to be in pancytopenia with platelet count of 9, hemoglobin 6.9. He was given blood, and had dialysis today. His platelet count remained low, around 20K, and his Hb rose to 9.2 g per dL. He denies chest pain. No diarrhea. He has been quite weak, and lives alone, and doesn't feel he can go home.    Rewiew of Systems:  Constitutional: Negative for malaise, fever and chills. No significant weight loss or weight gain Eyes: Negative for eye pain, redness and discharge, diplopia, visual changes, or flashes of light. ENMT: Negative for ear pain, hoarseness, nasal congestion, sinus pressure and sore throat. No headaches; tinnitus, drooling, or problem swallowing. Cardiovascular: Negative for chest pain, palpitations, diaphoresis, dyspnea and peripheral edema. ; No orthopnea, PND Respiratory: Negative for cough, hemoptysis, wheezing and stridor. No pleuritic chestpain. Gastrointestinal: Negative for nausea, vomiting, diarrhea, constipation, abdominal pain, melena, blood in stool, hematemesis, jaundice and rectal bleeding.    Genitourinary: Negative for frequency, dysuria, incontinence,flank pain and hematuria; Musculoskeletal: Negative for back pain and neck pain. Negative for swelling and trauma.;  Skin: . Negative  for pruritus, rash, abrasions, bruising and skin lesion.; ulcerations Neuro: Negative for headache, lightheadedness and neck stiffness. Negative for weakness, altered level of consciousness , altered mental status, extremity weakness, burning feet, involuntary movement, seizure and syncope.  Psych: negative for anxiety, depression, insomnia, tearfulness, panic attacks, hallucinations, paranoia, suicidal or homicidal ideation   Past Medical History  Diagnosis Date  . Hypertension   . Ruptured cervical disc   . Fall resulting in striking against other object     paralyzed  . Multiple myeloma   . Recurrent multiple myeloma of bone marrow with unknown EBV status ([place])   . Multiple myeloma ([place]) 01/26/2011  . Unspecified vitamin D deficiency 03/06/2013  . Anemia of chronic disease 12/30/2013  . Constipation   . Dialysis patient ([place]) 2016  . Thrombocytopenia ([place]) 08/31/2014  . 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 Constipation   . Hypoxia 09/2015    Past Surgical History  Procedure Laterality Date  . Inguinal hernia repair Bilateral   . Anterior cervical decomp/discectomy fusion    . Bone marrow aspirate and biopsy wiith lumbar puncture    . Melanoma excision      rt. breast  . Cervical spine surgery    . Kyphoplasty Bilateral 01/30/2014    Procedure: Thoracic eleven Kyphoplasty;  Surgeon: Consuella Lose, MD;  Location: [facility];  Service: Neurosurgery;  Laterality: Bilateral;  Thoracic eleven Kyphoplasty  . Av fistula placement Right 04/30/2014    Procedure: BRACHIOCEPHALIC ARTERIOVENOUS (AV) FISTULA CREATION;  Surgeon: Conrad Homer Glen, MD;  Location: [facility];  Service: Vascular;  Laterality: Right;  . Portacath placement Left 02/18/2015    Procedure: INSERTION OF PORT A CATH RIGHT INTERNAL JUGULAR VEIN ;  Surgeon: Conrad Weyerhaeuser, MD;  Location: [facility];  Service: Vascular;   Laterality: Left;  . Cataract extraction w/phaco Right 04/15/2015    Procedure: CATARACT EXTRACTION PHACO AND INTRAOCULAR LENS PLACEMENT (IOC);  Surgeon: Tonny Branch, MD;  Location: [facility];  Service: Ophthalmology;  Laterality: Right;  CDE:8.97  . Cataract extraction w/phaco Left 04/29/2015    Procedure: CATARACT EXTRACTION PHACO AND INTRAOCULAR LENS PLACEMENT ; CDE:  7.61;  Surgeon: Tonny Branch, MD;  Location: [facility];  Service: Ophthalmology;  Laterality: Left;  . Electrophysiologic study N/A 10/03/2015    Procedure: A-Flutter Ablation;  Surgeon: Evans Lance, MD;  Location: [facility];  Service: Cardiovascular;  Laterality: N/A;    Medications:  HOME MEDS: Prior to Admission medications   Medication Sig Start Date End Date Taking? Authorizing Provider  amLODipine (NORVASC) 5 MG tablet Take 5 mg by mouth daily.   Yes Historical Provider, MD  carvedilol (COREG) 3.125 MG tablet Take 1 tablet (3.125 mg total) by mouth 2 (two) times daily with a meal. 10/05/15  Yes Bonnielee Haff, MD  cinacalcet (SENSIPAR) 30 MG tablet Take 30 mg by mouth at bedtime.   Yes Historical Provider, MD  docusate sodium (COLACE) 100 MG capsule Take 100 mg by mouth 2 (two) times daily.   Yes Historical Provider, MD  HYDROcodone-acetaminophen (NORCO/VICODIN) 5-325 MG tablet Take 1 tablet by mouth every 6 (six) hours as needed for severe pain. 09/23/15  Yes Manon Hilding Kefalas, PA-C  lisinopril (PRINIVIL,ZESTRIL) 20 MG tablet Take 20 mg by mouth every morning.  06/27/15  Yes Historical Provider, MD  LYRICA 75 MG capsule TAKE 1 CAPSULE BY MOUTH TWICE DAILY. 10/07/15  Yes Manon Hilding Kefalas, PA-C  ondansetron (ZOFRAN) 8 MG tablet Take 1 tablet (8 mg total) by mouth every 8 (eight) hours as needed for nausea or vomiting. 03/15/15  Yes Manon Hilding Kefalas, PA-C  polyethylene glycol (MIRALAX / GLYCOLAX) packet Take 17 g by mouth 2 (two) times daily. Patient taking differently: Take 17 g by mouth daily as needed for mild constipation.   10/05/15  Yes Bonnielee Haff, MD  torsemide (DEMADEX) 20 MG tablet Take 40 mg by mouth daily.  10/07/15  Yes Historical Provider, MD     Allergies:  Allergies  Allergen Reactions  . Pamidronate Anaphylaxis    blood pressure     Social History:   reports that he has never smoked. He has never used smokeless tobacco. He reports that he does not drink alcohol or use illicit drugs.  Family History: Family History  Problem Relation Age of Onset  . Cancer Sister   . Cancer Brother      Physical Exam: Filed Vitals:   11/14/2015 2041 11/12/2015 2141 10/27/2015 2210 10/23/15 1454  BP:  122/62    Pulse:  67    Temp: 102.4 F (39.1 C) 99.2 F (37.3 C) 98.7 F (37.1 C)   TempSrc: Oral Oral Oral   Resp:  20    Height:      Weight:      SpO2:  98%  98%   Blood pressure 122/62, pulse 67, temperature 98.7 F (37.1 C), temperature source Oral, resp. rate 20, height 5' 11"  (1.803 m), weight 103 kg (227 lb 1.2 oz), SpO2 98 %.  GEN:  Pleasant patient lying in the stretcher in no acute distress; cooperative with exam.  PSYCH:  alert and oriented x4; does not appear anxious or depressed; affect is appropriate. HEENT: Mucous membranes pink and anicteric; PERRLA; EOM intact; no cervical lymphadenopathy nor thyromegaly or carotid bruit; no JVD; There were no stridor. Neck is very supple. Breasts:: Not examined CHEST WALL: No tenderness.  He has a port a cath.  CHEST: Normal respiration, clear to auscultation bilaterally.  HEART: Regular rate and rhythm.  There are no murmur, rub, or gallops.   BACK: No kyphosis or scoliosis; no CVA tenderness ABDOMEN: soft and non-tender; no masses, no organomegaly, normal abdominal bowel sounds; no pannus; no intertriginous candida. There is no rebound and no distention. Rectal Exam: Not done EXTREMITIES: No bone or joint deformity; age-appropriate arthropathy of the hands and knees; no edema; no ulcerations.  There is no calf tenderness. Genitalia: not  examined PULSES: 2+ and symmetric SKIN: Normal hydration no rash or ulceration CNS: Cranial nerves 2-12 grossly intact no focal lateralizing neurologic deficit.  Speech is fluent; uvula elevated with phonation, facial symmetry and tongue midline. DTR are normal bilaterally, cerebella exam is intact, barbinski is negative and strengths are equaled bilaterally.  No sensory loss.   Labs on Admission:  Basic Metabolic Panel:  Recent Labs Lab 10/25/2015 0034 10/23/15 0613  NA 138 137  K 5.1 4.3  CL 106 97*  CO2 22 32  GLUCOSE 112* 96  BUN 53* 38*  CREATININE 7.07* 5.15*  CALCIUM 9.2 8.3*  PHOS  --  4.6   Liver Function Tests:  Recent Labs Lab 10/23/2015 0034 10/23/15 0613  AST 23  --   ALT 20  --   ALKPHOS 44  --   BILITOT 0.8  --   PROT 7.0  --   ALBUMIN 3.7 3.4*    Recent Labs Lab 10/30/2015 0034  LIPASE 51   CBC:  Recent Labs Lab 11/01/2015 0215 10/27/2015 1233 10/23/15 0613  WBC 2.3* 2.8* 3.6*  NEUTROABS 1.4*  --   --   HGB 6.9* 9.2* 9.0*  HCT 21.3* 27.7* 26.6*  MCV 112.1* 109.5* 109.5*  PLT 9* 24* 19*    Radiological Exams on Admission: Dg Abd Acute W/chest  11/14/2015  CLINICAL DATA:  Vomiting EXAM: DG ABDOMEN ACUTE W/ 1V CHEST COMPARISON:  10/01/2015 chest x-ray FINDINGS: Chronic cardiomegaly. Negative aortic and hilar contours. Porta catheter on the right with tip at the SVC. Vascular stent on the right. Chronic sclerotic appearance of posterior right fifth, sixth, seventh ribs - presumably related to patient's history of myeloma. No edema or pneumonia. No evidence of pneumoperitoneum or pneumatosis. Nonobstructive bowel gas pattern. No concerning intra-abdominal mass effect or calcification. IMPRESSION: 1. Nonobstructive bowel gas pattern. 2. Chronic small right pleural effusion. Electronically Signed   By: Monte Fantasia M.D.   On: 11/03/2015 01:22   Assessment/Plan Present on Admission:  . Hypoxia . Anemia . Thrombocytopenia ([place]) . Pancytopenia ([place]) .  Multiple myeloma ([place]) . Anemia of chronic disease . Systolic HF (heart failure) (HCC)  PLAN:  Coughs: Volume overload. ESRD on HD. Doing better now post dialysis. Will give nebs as he has some wheezing.   Will see if he can be weaned off oxygen.   MDS: Platelet count is better today at 24K. Anemia: S/p transfusion, with Hb now at 9g per dL, feeling better. Multiple myeloma: S/p autologous BM transplant, currently off therapy.  Pancytopenia: Has been followed closely by oncology outpatient.   Other plans as per orders.  Code Status: DNR.   Orvan Falconer, MD.  Rosalita Chessman [facility] Pager [phone] 7pm to 7am.  10/23/2015, 3:47 PM

## 2015-10-23 NOTE — Care Management Note (Addendum)
Case Management Note  Patient Details  Name: [name] MRN: [id number] Date of Birth: May 01, 1946  Subjective/Objective: Patient is very tired and reluctant to talk. Patient is from home alone. He reports he has independent with ADL's and has been driving himself to appointments. He states that he does not have a PCP and does not want a list of PCP's accepting new patients. He reports that he sees his oncologist here at [facility] only. Patient is currently using oxygen but does not use oxygen at home . He reports not having used any home health in the past.Patient is ESRD, receives dialysis T,TH,S.        Action/Plan: No CM needs identified, will follow.    Expected Discharge Date:     10/23/2015            Expected Discharge Plan:  Home/Self Care  In-House Referral:     Discharge planning Services  CM Consult  Post Acute Care Choice:  NA Choice offered to:  NA  DME Arranged:    DME Agency:     HH Arranged:    HH Agency:     Status of Service:  In process, will continue to follow  Medicare Important Message Given:    Date Medicare IM Given:    Medicare IM give by:    Date Additional Medicare IM Given:    Additional Medicare Important Message give by:     If discussed at [place] of Stay Meetings, dates discussed:    Additional Comments:  Charlotte Brafford, Chauncey Reading, RN 10/23/2015, 2:46 PM

## 2015-10-24 DIAGNOSIS — Z992 Dependence on renal dialysis: Secondary | ICD-10-CM | POA: Diagnosis not present

## 2015-10-24 DIAGNOSIS — D649 Anemia, unspecified: Secondary | ICD-10-CM | POA: Diagnosis not present

## 2015-10-24 DIAGNOSIS — N186 End stage renal disease: Secondary | ICD-10-CM | POA: Diagnosis not present

## 2015-10-27 ENCOUNTER — Other Ambulatory Visit (HOSPITAL_COMMUNITY): Payer: Self-pay | Admitting: Oncology

## 2015-10-30 ENCOUNTER — Other Ambulatory Visit (HOSPITAL_COMMUNITY): Payer: Medicare Other

## 2015-11-06 ENCOUNTER — Ambulatory Visit (HOSPITAL_COMMUNITY): Payer: Medicare Other | Admitting: Oncology

## 2015-11-06 ENCOUNTER — Other Ambulatory Visit (HOSPITAL_COMMUNITY): Payer: Medicare Other

## 2015-11-16 NOTE — Progress Notes (Signed)
Late entry for 2015-11-23 at 0143: During assessment patient was found to be in NAD, VS were normal. When it was time for the patients next breathing treatment, RT went in the room and found the patient unresponsive. The patient was verified as not having a pulse by two RN's at 0143. Paged on call MD, donor services, and patients next of kin.

## 2015-11-16 NOTE — Progress Notes (Signed)
Late entry 2015/11/14: paged security and had patients valuables released to his brother and sister-in-law, along with any patients belongings in the room.

## 2015-11-16 DEATH — deceased

## 2015-11-25 ENCOUNTER — Ambulatory Visit: Payer: Medicare Other | Admitting: Internal Medicine

## 2016-01-17 NOTE — Discharge Summary (Signed)
Death Summary  [name] MWU:[id number] DOB: [birth year] DOA: 11/09/2015  PCP: No PCP Per Patient  Admit date: November 09, 2015 Date of Death: 01/22/2016  Final Diagnoses:  Active Problems:   Multiple myeloma (HCC)   Anemia of chronic disease   Thrombocytopenia (HCC)   Anemia   Pancytopenia (HCC)   ESRD on hemodialysis ([place])   Hypoxia   Systolic HF (heart failure) ([place])    History of present illness:  Patient was admitted into the hospital for emesis by Dr Darrick Meigs on November 09, 2015.  As per his H and P:  "  [name]  is a 70 y.o. male, With history of hypertension, multiple myeloma, pancytopenia a end-stage renal disease on hemodialysis who came to the hospital with chief complaint of vomiting and coughing for pulse 3 days. Patient was dialyzed on Saturday and says that coughing has become worse today. He denies shortness of breath. No fever but complains of chills. No history of COPD and CHF. Patient requiring 2 L of oxygen to keep oxygen sats greater than 90%. Patient was seen by oncology on 10/18/15 and was given 1 unit of blood transfusion In the ED patient found to be in pancytopenia with platelet count of 9, hemoglobin 6.9 He denies chest pain. No diarrhea.   Hospital Course:  [name] is a 69 y.o. male, with history of hypertension, multiple myeloma, pancytopenia a end-stage renal disease on hemodialysis who came to the hospital with chief complaint of vomiting and coughing for past 3 days. Patient was dialyzed on Saturday and says that coughing has become worse on admission. He denies shortness of breath. No fever but complains of chills. No history of COPD and CHF. Patient requiring 2 L of oxygen to keep oxygen sats greater than 90%. Patient was seen by oncology on 10/18/15 and was given 1 unit of blood transfusion In the ED patient found to be in pancytopenia with platelet count of 9, hemoglobin 6.9. He was given blood, and had dialysis today. His platelet count remained low, around  20K, and his Hb rose to 9.2 g per dL. He denies chest pain. No diarrhea. He has been quite weak, and lives alone, and doesn't feel he can go home. In the morning of 2022-11-11, at 01:43, when it was time for his next respiratory Tx, RN found him without pulse, and swince his code status was DNR, no code blue was initiated, and he was pronouned death by 2 RNs at 01:43 on 11/11/15.    SignedOrvan Falconer MD FACP.   [facility] Jan 22, 2016, 4:28 AM

## 2016-01-24 ENCOUNTER — Ambulatory Visit: Payer: Medicare Other | Admitting: Cardiovascular Disease

## 2017-03-02 LAB — PREPARE PLATELET PHERESIS
UNIT DIVISION: 0
Unit division: 0

## 2017-03-02 LAB — BPAM PLATELET PHERESIS
BLOOD PRODUCT EXPIRATION DATE: 201703022359
Blood Product Expiration Date: 201703152359
ISSUE DATE / TIME: 201702272148
ISSUE DATE / TIME: 201703132014
UNIT TYPE AND RH: 5100
Unit Type and Rh: 6200

## 2017-05-02 IMAGING — DX DG CHEST 2V
2 series · 2 of 2 positions shown · non-contrast
Comparison: 06/23/2014.

CLINICAL DATA: Dry cough for several days. History of multiple
myeloma.

EXAM:
CHEST  2 VIEW

[chest pa]
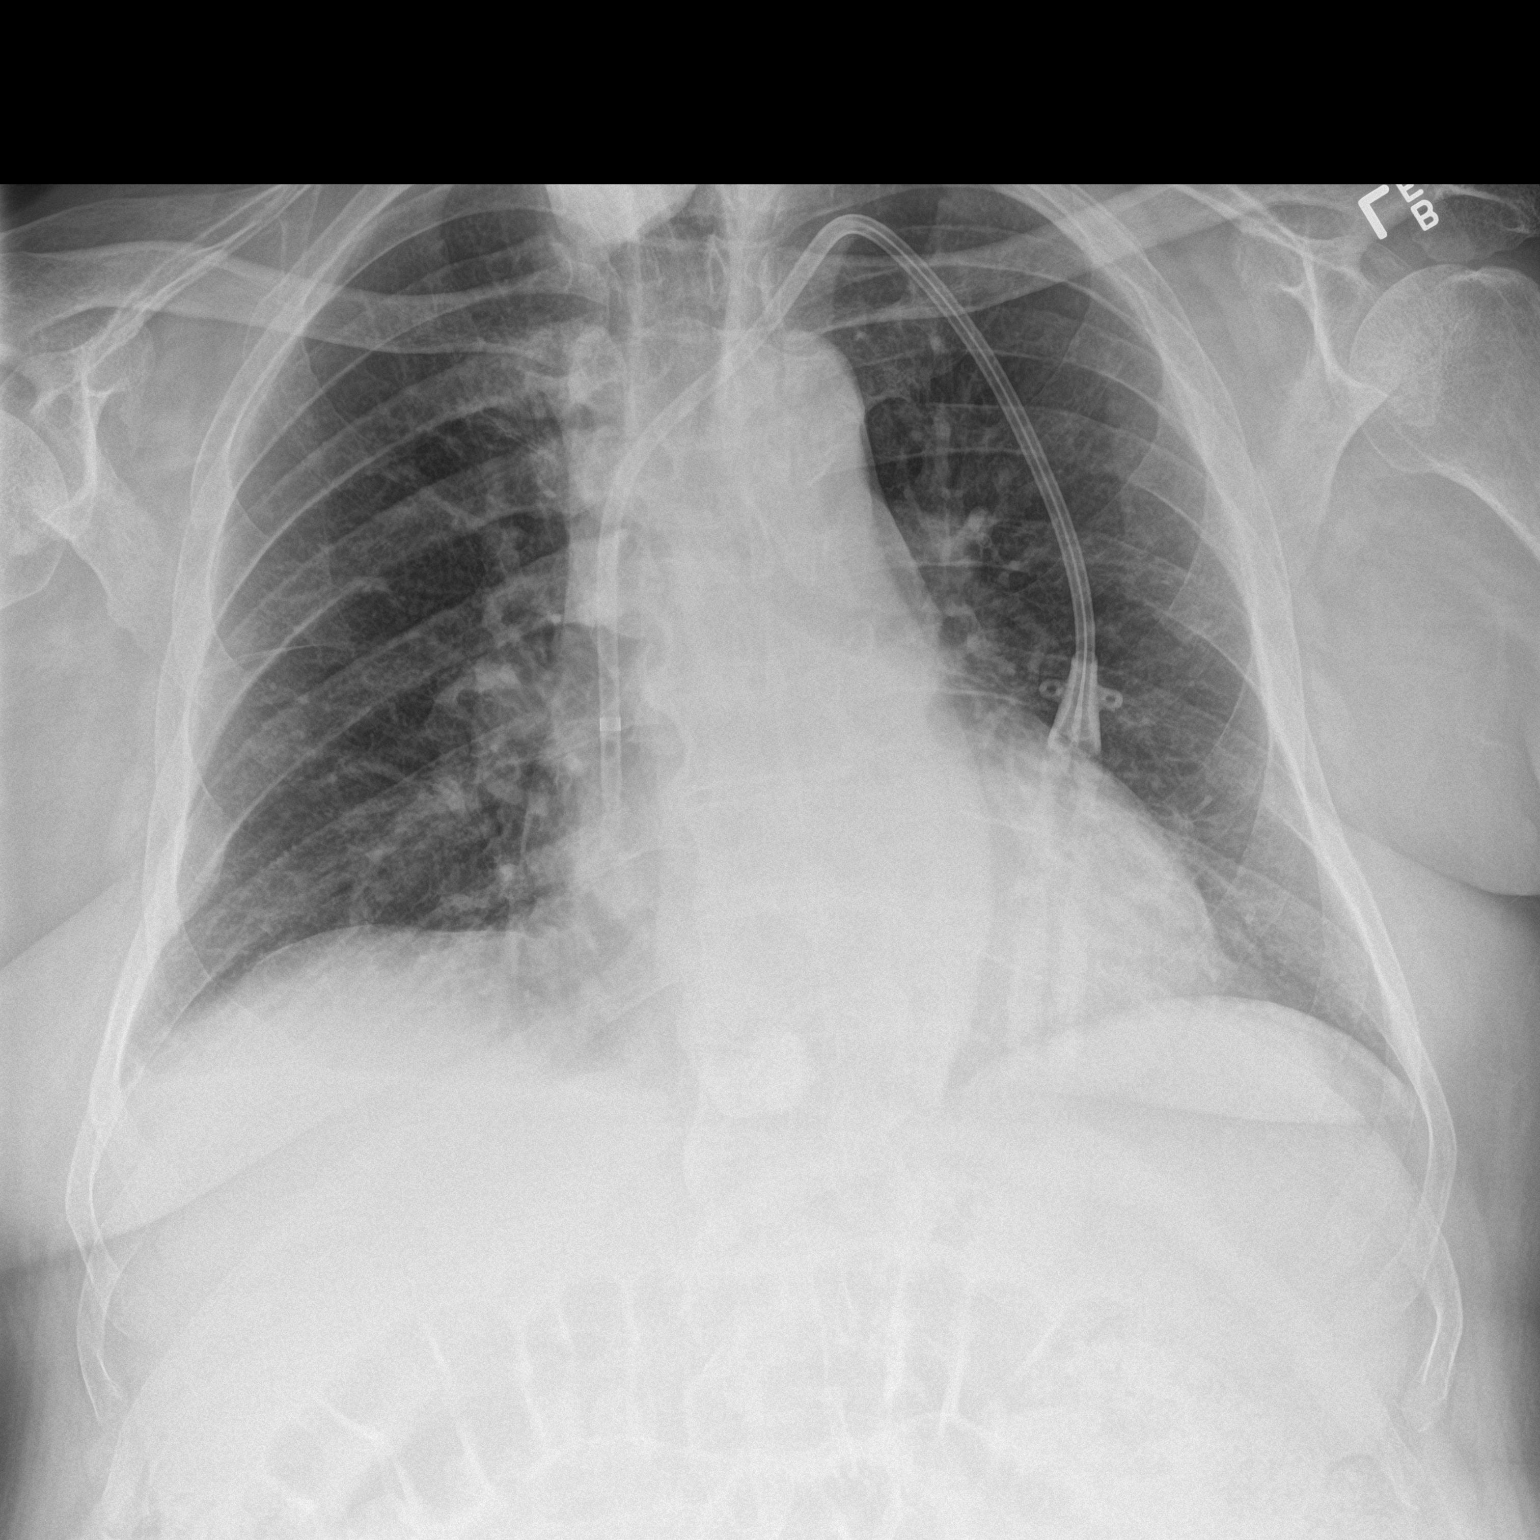

[chest lat]
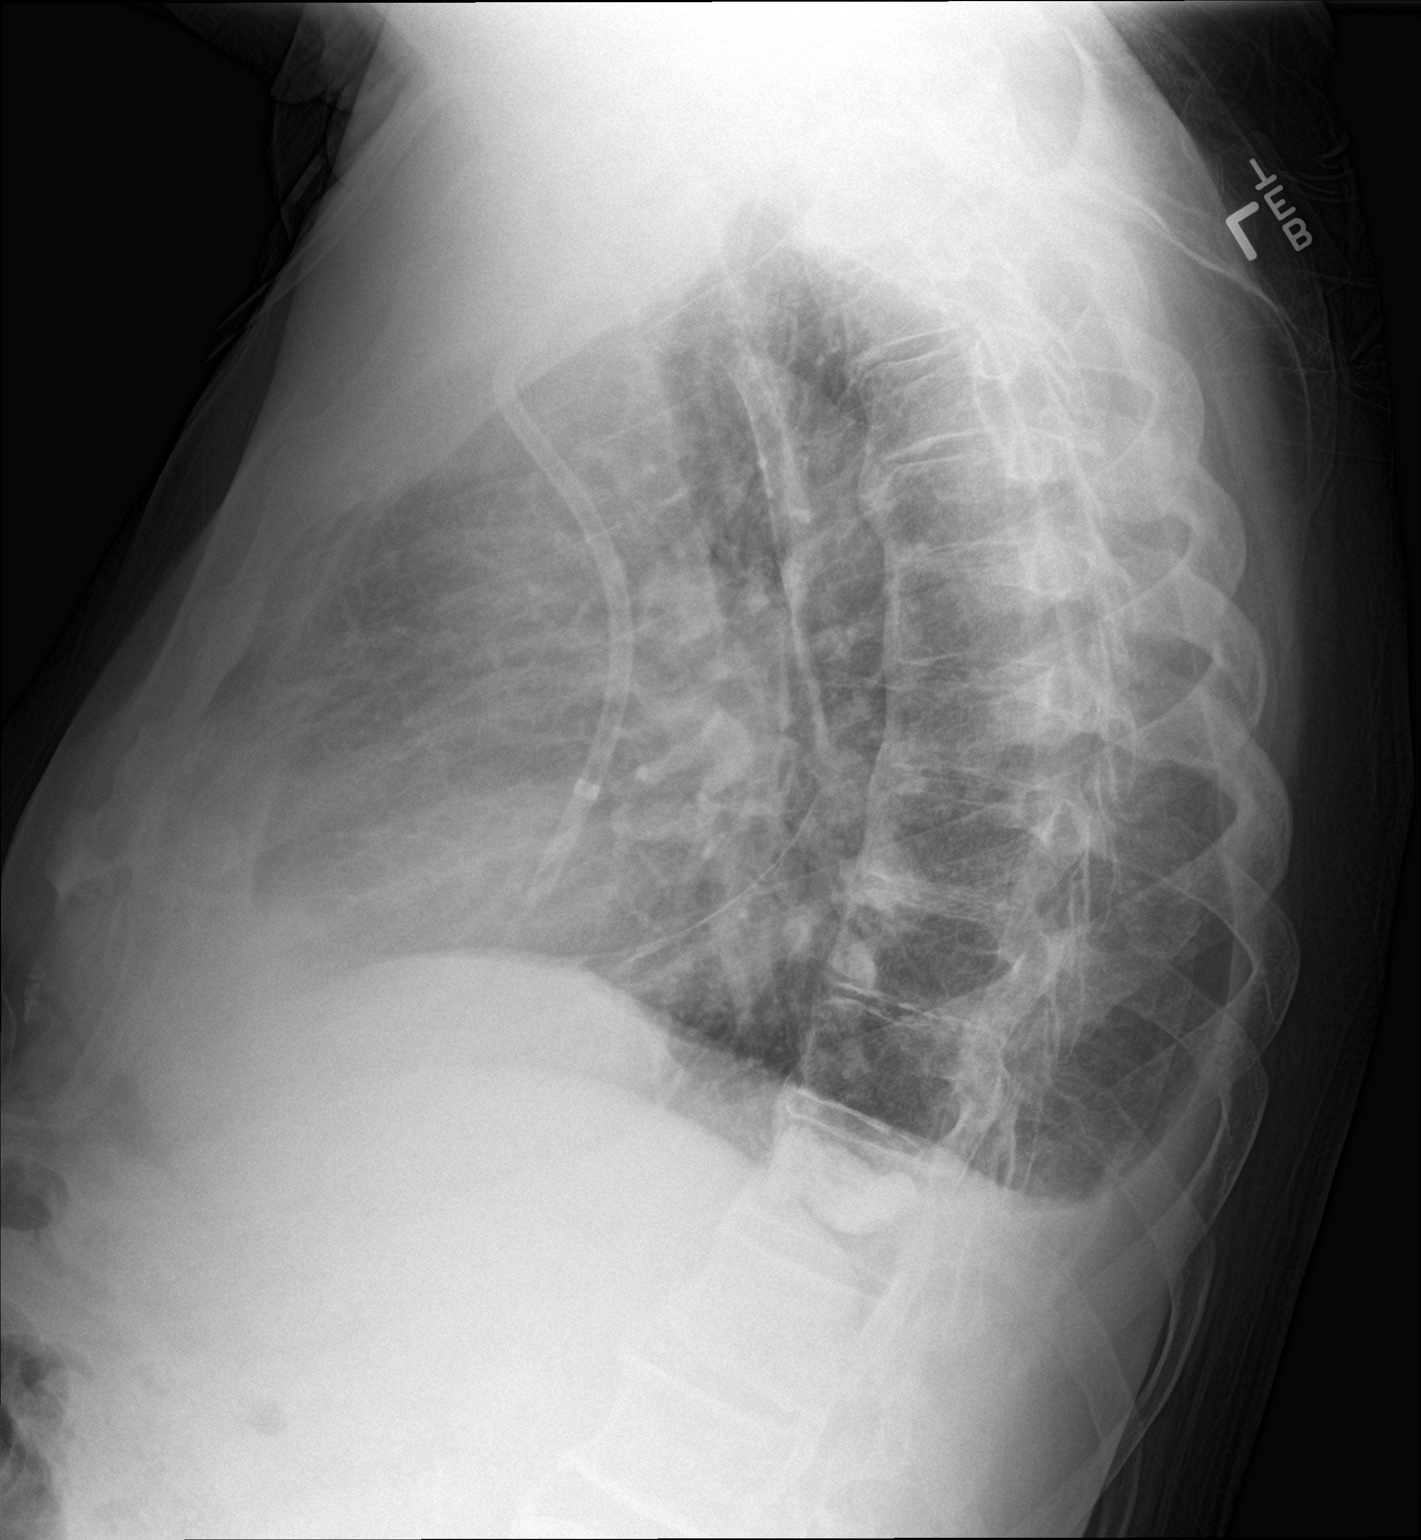

[2 of 2 positions shown; findings below may reference images not displayed]

FINDINGS: LEFT IJ dialysis catheter is present. Cardiopericardial silhouette
is enlarged for projection. Lower thoracic vertebral augmentation.
Small bilateral pleural effusions are present which layer
dependently, blunting costophrenic angles. No airspace disease is
identified. No pneumothorax.
IMPRESSION: Cardiomegaly and small bilateral pleural effusions suggest volume
overload.
